# Patient Record
Sex: Male | Born: 1940 | Race: White | Hispanic: No | Marital: Married | Smoking: Never smoker
Health system: Southern US, Community
[De-identification: ages and names within clinical notes are randomized; demographics above are authoritative.]

## PROBLEM LIST (undated history)

## (undated) DIAGNOSIS — R519 Headache, unspecified: Secondary | ICD-10-CM

## (undated) DIAGNOSIS — E785 Hyperlipidemia, unspecified: Secondary | ICD-10-CM

## (undated) DIAGNOSIS — R6883 Chills (without fever): Secondary | ICD-10-CM

## (undated) DIAGNOSIS — R252 Cramp and spasm: Secondary | ICD-10-CM

## (undated) DIAGNOSIS — I5032 Chronic diastolic (congestive) heart failure: Secondary | ICD-10-CM

## (undated) DIAGNOSIS — M545 Low back pain, unspecified: Secondary | ICD-10-CM

## (undated) DIAGNOSIS — L03119 Cellulitis of unspecified part of limb: Secondary | ICD-10-CM

## (undated) DIAGNOSIS — Z9981 Dependence on supplemental oxygen: Secondary | ICD-10-CM

## (undated) DIAGNOSIS — Z952 Presence of prosthetic heart valve: Secondary | ICD-10-CM

## (undated) DIAGNOSIS — I2510 Atherosclerotic heart disease of native coronary artery without angina pectoris: Secondary | ICD-10-CM

## (undated) DIAGNOSIS — M1990 Unspecified osteoarthritis, unspecified site: Secondary | ICD-10-CM

## (undated) DIAGNOSIS — K449 Diaphragmatic hernia without obstruction or gangrene: Secondary | ICD-10-CM

## (undated) DIAGNOSIS — I350 Nonrheumatic aortic (valve) stenosis: Secondary | ICD-10-CM

## (undated) DIAGNOSIS — R51 Headache: Secondary | ICD-10-CM

## (undated) DIAGNOSIS — I739 Peripheral vascular disease, unspecified: Secondary | ICD-10-CM

## (undated) DIAGNOSIS — L02419 Cutaneous abscess of limb, unspecified: Secondary | ICD-10-CM

## (undated) DIAGNOSIS — I779 Disorder of arteries and arterioles, unspecified: Secondary | ICD-10-CM

## (undated) DIAGNOSIS — F419 Anxiety disorder, unspecified: Secondary | ICD-10-CM

## (undated) DIAGNOSIS — R059 Cough, unspecified: Secondary | ICD-10-CM

## (undated) DIAGNOSIS — K219 Gastro-esophageal reflux disease without esophagitis: Secondary | ICD-10-CM

## (undated) DIAGNOSIS — G4730 Sleep apnea, unspecified: Secondary | ICD-10-CM

## (undated) DIAGNOSIS — R600 Localized edema: Secondary | ICD-10-CM

## (undated) DIAGNOSIS — R062 Wheezing: Secondary | ICD-10-CM

## (undated) DIAGNOSIS — I272 Pulmonary hypertension, unspecified: Secondary | ICD-10-CM

## (undated) DIAGNOSIS — S42201A Unspecified fracture of upper end of right humerus, initial encounter for closed fracture: Secondary | ICD-10-CM

## (undated) DIAGNOSIS — R0602 Shortness of breath: Secondary | ICD-10-CM

## (undated) DIAGNOSIS — I10 Essential (primary) hypertension: Secondary | ICD-10-CM

## (undated) DIAGNOSIS — I050 Rheumatic mitral stenosis: Secondary | ICD-10-CM

## (undated) DIAGNOSIS — S8391XA Sprain of unspecified site of right knee, initial encounter: Secondary | ICD-10-CM

## (undated) DIAGNOSIS — R05 Cough: Secondary | ICD-10-CM

## (undated) DIAGNOSIS — I872 Venous insufficiency (chronic) (peripheral): Secondary | ICD-10-CM

## (undated) DIAGNOSIS — R0981 Nasal congestion: Secondary | ICD-10-CM

## (undated) HISTORY — DX: Cutaneous abscess of limb, unspecified: L02.419

## (undated) HISTORY — DX: Essential (primary) hypertension: I10

## (undated) HISTORY — DX: Cough: R05

## (undated) HISTORY — DX: Nonrheumatic aortic (valve) stenosis: I35.0

## (undated) HISTORY — DX: Cramp and spasm: R25.2

## (undated) HISTORY — DX: Nasal congestion: R09.81

## (undated) HISTORY — DX: Cellulitis of unspecified part of limb: L03.119

## (undated) HISTORY — DX: Sleep apnea, unspecified: G47.30

## (undated) HISTORY — DX: Venous insufficiency (chronic) (peripheral): I87.2

## (undated) HISTORY — DX: Hyperlipidemia, unspecified: E78.5

## (undated) HISTORY — DX: Localized edema: R60.0

## (undated) HISTORY — DX: Chills (without fever): R68.83

## (undated) HISTORY — DX: Wheezing: R06.2

## (undated) HISTORY — DX: Headache, unspecified: R51.9

## (undated) HISTORY — DX: Cough, unspecified: R05.9

## (undated) HISTORY — DX: Unspecified fracture of upper end of right humerus, initial encounter for closed fracture: S42.201A

## (undated) HISTORY — DX: Chronic diastolic (congestive) heart failure: I50.32

## (undated) HISTORY — PX: OTHER SURGICAL HISTORY: SHX169

## (undated) HISTORY — PX: CORONARY ARTERY BYPASS GRAFT: SHX141

## (undated) HISTORY — PX: CARDIAC CATHETERIZATION: SHX172

## (undated) HISTORY — DX: Gastro-esophageal reflux disease without esophagitis: K21.9

## (undated) HISTORY — DX: Diaphragmatic hernia without obstruction or gangrene: K44.9

## (undated) HISTORY — DX: Sprain of unspecified site of right knee, initial encounter: S83.91XA

## (undated) HISTORY — DX: Peripheral vascular disease, unspecified: I73.9

## (undated) HISTORY — DX: Pulmonary hypertension, unspecified: I27.20

## (undated) HISTORY — DX: Disorder of arteries and arterioles, unspecified: I77.9

## (undated) HISTORY — DX: Low back pain, unspecified: M54.50

## (undated) HISTORY — DX: Rheumatic mitral stenosis: I05.0

## (undated) HISTORY — DX: Low back pain: M54.5

## (undated) HISTORY — DX: Headache: R51

---

## 1996-11-05 DIAGNOSIS — Z951 Presence of aortocoronary bypass graft: Secondary | ICD-10-CM | POA: Insufficient documentation

## 1996-11-05 DIAGNOSIS — I2510 Atherosclerotic heart disease of native coronary artery without angina pectoris: Secondary | ICD-10-CM

## 1996-11-05 HISTORY — DX: Atherosclerotic heart disease of native coronary artery without angina pectoris: I25.10

## 2001-01-07 ENCOUNTER — Encounter: Payer: Self-pay | Admitting: *Deleted

## 2001-01-07 ENCOUNTER — Ambulatory Visit (HOSPITAL_COMMUNITY): Admission: RE | Admit: 2001-01-07 | Discharge: 2001-01-08 | Payer: Self-pay | Admitting: *Deleted

## 2001-07-02 ENCOUNTER — Encounter: Payer: Self-pay | Admitting: *Deleted

## 2001-07-02 ENCOUNTER — Ambulatory Visit (HOSPITAL_COMMUNITY): Admission: RE | Admit: 2001-07-02 | Discharge: 2001-07-03 | Payer: Self-pay | Admitting: *Deleted

## 2001-10-07 ENCOUNTER — Ambulatory Visit (HOSPITAL_COMMUNITY): Admission: RE | Admit: 2001-10-07 | Discharge: 2001-10-08 | Payer: Self-pay | Admitting: Ophthalmology

## 2002-09-22 ENCOUNTER — Ambulatory Visit (HOSPITAL_COMMUNITY): Admission: RE | Admit: 2002-09-22 | Discharge: 2002-09-23 | Payer: Self-pay | Admitting: Cardiology

## 2002-09-22 ENCOUNTER — Encounter: Payer: Self-pay | Admitting: Cardiology

## 2002-10-05 ENCOUNTER — Encounter (HOSPITAL_COMMUNITY): Admission: RE | Admit: 2002-10-05 | Discharge: 2003-01-03 | Payer: Self-pay | Admitting: Cardiology

## 2002-10-10 ENCOUNTER — Ambulatory Visit (HOSPITAL_BASED_OUTPATIENT_CLINIC_OR_DEPARTMENT_OTHER): Admission: RE | Admit: 2002-10-10 | Discharge: 2002-10-10 | Payer: Self-pay | Admitting: Cardiology

## 2003-01-04 ENCOUNTER — Encounter (HOSPITAL_COMMUNITY): Admission: RE | Admit: 2003-01-04 | Discharge: 2003-04-04 | Payer: Self-pay | Admitting: Cardiology

## 2004-03-28 ENCOUNTER — Ambulatory Visit: Payer: Self-pay | Admitting: Cardiology

## 2004-07-24 ENCOUNTER — Ambulatory Visit: Payer: Self-pay | Admitting: Family Medicine

## 2004-09-27 ENCOUNTER — Ambulatory Visit: Payer: Self-pay

## 2004-10-04 ENCOUNTER — Ambulatory Visit: Payer: Self-pay | Admitting: Cardiology

## 2004-10-09 ENCOUNTER — Ambulatory Visit: Payer: Self-pay | Admitting: Family Medicine

## 2004-10-10 ENCOUNTER — Ambulatory Visit: Payer: Self-pay | Admitting: Cardiology

## 2004-10-10 ENCOUNTER — Ambulatory Visit (HOSPITAL_COMMUNITY): Admission: RE | Admit: 2004-10-10 | Discharge: 2004-10-10 | Payer: Self-pay | Admitting: Cardiology

## 2004-11-22 ENCOUNTER — Ambulatory Visit: Payer: Self-pay | Admitting: Family Medicine

## 2005-02-21 ENCOUNTER — Ambulatory Visit: Payer: Self-pay | Admitting: Cardiology

## 2005-06-06 ENCOUNTER — Ambulatory Visit: Payer: Self-pay | Admitting: Family Medicine

## 2005-08-03 ENCOUNTER — Ambulatory Visit: Payer: Self-pay | Admitting: Cardiology

## 2005-09-19 ENCOUNTER — Ambulatory Visit: Payer: Self-pay

## 2005-12-10 ENCOUNTER — Ambulatory Visit: Payer: Self-pay | Admitting: Internal Medicine

## 2006-02-06 ENCOUNTER — Ambulatory Visit: Payer: Self-pay | Admitting: Family Medicine

## 2006-02-12 ENCOUNTER — Ambulatory Visit: Payer: Self-pay | Admitting: Cardiology

## 2006-02-12 ENCOUNTER — Ambulatory Visit: Payer: Self-pay | Admitting: Internal Medicine

## 2006-03-19 ENCOUNTER — Ambulatory Visit: Payer: Self-pay | Admitting: Internal Medicine

## 2006-04-09 ENCOUNTER — Ambulatory Visit: Payer: Self-pay | Admitting: Family Medicine

## 2006-08-20 ENCOUNTER — Ambulatory Visit: Payer: Self-pay | Admitting: Family Medicine

## 2006-08-20 DIAGNOSIS — S60459A Superficial foreign body of unspecified finger, initial encounter: Secondary | ICD-10-CM | POA: Insufficient documentation

## 2006-11-18 ENCOUNTER — Ambulatory Visit: Payer: Self-pay | Admitting: Family Medicine

## 2006-12-26 ENCOUNTER — Ambulatory Visit: Payer: Self-pay | Admitting: Cardiology

## 2006-12-31 ENCOUNTER — Ambulatory Visit: Payer: Self-pay

## 2006-12-31 ENCOUNTER — Encounter: Payer: Self-pay | Admitting: Family Medicine

## 2006-12-31 HISTORY — PX: OTHER SURGICAL HISTORY: SHX169

## 2007-01-06 ENCOUNTER — Ambulatory Visit: Payer: Self-pay | Admitting: Pulmonary Disease

## 2007-01-23 ENCOUNTER — Encounter: Payer: Self-pay | Admitting: Critical Care Medicine

## 2007-01-23 DIAGNOSIS — E785 Hyperlipidemia, unspecified: Secondary | ICD-10-CM | POA: Insufficient documentation

## 2007-01-23 DIAGNOSIS — E1149 Type 2 diabetes mellitus with other diabetic neurological complication: Secondary | ICD-10-CM

## 2007-01-23 DIAGNOSIS — M545 Low back pain: Secondary | ICD-10-CM | POA: Insufficient documentation

## 2007-01-23 DIAGNOSIS — K449 Diaphragmatic hernia without obstruction or gangrene: Secondary | ICD-10-CM | POA: Insufficient documentation

## 2007-01-23 DIAGNOSIS — I10 Essential (primary) hypertension: Secondary | ICD-10-CM | POA: Insufficient documentation

## 2007-01-23 DIAGNOSIS — K219 Gastro-esophageal reflux disease without esophagitis: Secondary | ICD-10-CM | POA: Insufficient documentation

## 2007-03-26 ENCOUNTER — Encounter: Payer: Self-pay | Admitting: Family Medicine

## 2007-03-27 ENCOUNTER — Encounter: Payer: Self-pay | Admitting: Family Medicine

## 2007-06-06 ENCOUNTER — Ambulatory Visit: Payer: Self-pay | Admitting: Family Medicine

## 2007-06-06 DIAGNOSIS — R252 Cramp and spasm: Secondary | ICD-10-CM | POA: Insufficient documentation

## 2007-06-09 LAB — CONVERTED CEMR LAB
Basophils Absolute: 0 10*3/uL (ref 0.0–0.1)
Basophils Relative: 1 % (ref 0–1)
CO2: 23 meq/L (ref 19–32)
Chloride: 103 meq/L (ref 96–112)
Eosinophils Absolute: 0.2 10*3/uL (ref 0.0–0.7)
Eosinophils Relative: 4 % (ref 0–5)
HCT: 39.1 % (ref 39.0–52.0)
Hemoglobin: 13.3 g/dL (ref 13.0–17.0)
Lymphocytes Relative: 19 % (ref 12–46)
Lymphs Abs: 1.2 10*3/uL (ref 0.7–4.0)
MCHC: 34 g/dL (ref 30.0–36.0)
MCV: 91.1 fL (ref 78.0–100.0)
Monocytes Absolute: 0.5 10*3/uL (ref 0.1–1.0)
Monocytes Relative: 9 % (ref 3–12)
Neutro Abs: 4.2 10*3/uL (ref 1.7–7.7)
Neutrophils Relative %: 68 % (ref 43–77)
Platelets: 265 10*3/uL (ref 150–400)
Potassium: 4.1 meq/L (ref 3.5–5.3)
RBC: 4.29 M/uL (ref 4.22–5.81)
RDW: 15 % (ref 11.5–15.5)
Sodium: 141 meq/L (ref 135–145)
TSH: 1.288 microintl units/mL (ref 0.350–5.50)
WBC: 6.2 10*3/uL (ref 4.0–10.5)

## 2007-06-17 ENCOUNTER — Telehealth: Payer: Self-pay | Admitting: Family Medicine

## 2007-06-25 ENCOUNTER — Encounter: Payer: Self-pay | Admitting: Family Medicine

## 2007-07-21 ENCOUNTER — Ambulatory Visit: Payer: Self-pay | Admitting: Family Medicine

## 2007-07-21 DIAGNOSIS — IMO0002 Reserved for concepts with insufficient information to code with codable children: Secondary | ICD-10-CM | POA: Insufficient documentation

## 2007-07-21 LAB — HM DIABETES EYE EXAM

## 2007-08-06 ENCOUNTER — Telehealth: Payer: Self-pay | Admitting: Family Medicine

## 2007-08-07 ENCOUNTER — Encounter: Payer: Self-pay | Admitting: Family Medicine

## 2007-09-24 ENCOUNTER — Ambulatory Visit: Payer: Self-pay

## 2007-09-26 ENCOUNTER — Telehealth: Payer: Self-pay | Admitting: Family Medicine

## 2007-10-01 ENCOUNTER — Ambulatory Visit: Payer: Self-pay | Admitting: Cardiology

## 2007-10-16 ENCOUNTER — Encounter: Payer: Self-pay | Admitting: Family Medicine

## 2007-11-25 ENCOUNTER — Telehealth: Payer: Self-pay | Admitting: Family Medicine

## 2008-01-16 ENCOUNTER — Ambulatory Visit: Payer: Self-pay

## 2008-01-16 ENCOUNTER — Ambulatory Visit: Payer: Self-pay | Admitting: Family Medicine

## 2008-01-16 DIAGNOSIS — R609 Edema, unspecified: Secondary | ICD-10-CM | POA: Insufficient documentation

## 2008-01-16 DIAGNOSIS — L02419 Cutaneous abscess of limb, unspecified: Secondary | ICD-10-CM | POA: Insufficient documentation

## 2008-01-16 DIAGNOSIS — L03119 Cellulitis of unspecified part of limb: Secondary | ICD-10-CM

## 2008-03-03 ENCOUNTER — Telehealth: Payer: Self-pay | Admitting: Family Medicine

## 2008-03-08 ENCOUNTER — Ambulatory Visit: Payer: Self-pay | Admitting: Family Medicine

## 2008-03-15 ENCOUNTER — Ambulatory Visit: Payer: Self-pay | Admitting: Family Medicine

## 2008-03-22 ENCOUNTER — Ambulatory Visit: Payer: Self-pay | Admitting: Family Medicine

## 2008-04-19 ENCOUNTER — Encounter: Payer: Self-pay | Admitting: Family Medicine

## 2008-05-10 ENCOUNTER — Encounter: Payer: Self-pay | Admitting: Family Medicine

## 2008-06-15 ENCOUNTER — Telehealth (INDEPENDENT_AMBULATORY_CARE_PROVIDER_SITE_OTHER): Payer: Self-pay | Admitting: *Deleted

## 2008-06-23 ENCOUNTER — Telehealth: Payer: Self-pay | Admitting: Family Medicine

## 2008-06-24 DIAGNOSIS — I25810 Atherosclerosis of coronary artery bypass graft(s) without angina pectoris: Secondary | ICD-10-CM

## 2008-06-24 DIAGNOSIS — I6529 Occlusion and stenosis of unspecified carotid artery: Secondary | ICD-10-CM | POA: Insufficient documentation

## 2008-06-24 DIAGNOSIS — G4730 Sleep apnea, unspecified: Secondary | ICD-10-CM | POA: Insufficient documentation

## 2008-07-19 ENCOUNTER — Encounter: Payer: Self-pay | Admitting: Cardiology

## 2008-07-28 ENCOUNTER — Telehealth: Payer: Self-pay | Admitting: Cardiology

## 2008-07-29 ENCOUNTER — Telehealth: Payer: Self-pay | Admitting: Family Medicine

## 2008-08-03 ENCOUNTER — Encounter: Payer: Self-pay | Admitting: Family Medicine

## 2008-09-21 ENCOUNTER — Ambulatory Visit: Payer: Self-pay | Admitting: Cardiology

## 2008-09-22 ENCOUNTER — Telehealth: Payer: Self-pay | Admitting: Cardiology

## 2008-10-08 ENCOUNTER — Inpatient Hospital Stay (HOSPITAL_COMMUNITY): Admission: EM | Admit: 2008-10-08 | Discharge: 2008-10-11 | Payer: Self-pay | Admitting: Cardiology

## 2008-10-08 ENCOUNTER — Ambulatory Visit: Payer: Self-pay | Admitting: Cardiology

## 2008-10-08 ENCOUNTER — Ambulatory Visit: Payer: Self-pay | Admitting: Internal Medicine

## 2008-10-11 ENCOUNTER — Encounter: Payer: Self-pay | Admitting: Cardiology

## 2008-10-11 ENCOUNTER — Ambulatory Visit: Payer: Self-pay | Admitting: *Deleted

## 2008-10-11 HISTORY — PX: OTHER SURGICAL HISTORY: SHX169

## 2008-10-11 HISTORY — PX: DOPPLER ECHOCARDIOGRAPHY: SHX263

## 2008-10-12 ENCOUNTER — Ambulatory Visit: Payer: Self-pay | Admitting: Cardiology

## 2008-10-13 LAB — CONVERTED CEMR LAB
BUN: 47 mg/dL — ABNORMAL HIGH (ref 6–23)
CO2: 31 meq/L (ref 19–32)
Calcium: 9.6 mg/dL (ref 8.4–10.5)
Chloride: 94 meq/L — ABNORMAL LOW (ref 96–112)
Creatinine, Ser: 1.9 mg/dL — ABNORMAL HIGH (ref 0.4–1.5)
GFR calc non Af Amer: 37.87 mL/min (ref >60)
Glucose, Bld: 382 mg/dL — ABNORMAL HIGH (ref 70–99)
Potassium: 5 meq/L (ref 3.5–5.1)
Sodium: 133 meq/L — ABNORMAL LOW (ref 135–145)

## 2008-10-15 ENCOUNTER — Ambulatory Visit: Payer: Self-pay | Admitting: Cardiology

## 2008-10-15 ENCOUNTER — Encounter (HOSPITAL_BASED_OUTPATIENT_CLINIC_OR_DEPARTMENT_OTHER): Admission: RE | Admit: 2008-10-15 | Discharge: 2009-01-13 | Payer: Self-pay | Admitting: Internal Medicine

## 2008-10-15 DIAGNOSIS — L98499 Non-pressure chronic ulcer of skin of other sites with unspecified severity: Secondary | ICD-10-CM

## 2008-10-15 DIAGNOSIS — I080 Rheumatic disorders of both mitral and aortic valves: Secondary | ICD-10-CM

## 2008-10-15 DIAGNOSIS — I70209 Unspecified atherosclerosis of native arteries of extremities, unspecified extremity: Secondary | ICD-10-CM

## 2008-10-15 DIAGNOSIS — I739 Peripheral vascular disease, unspecified: Secondary | ICD-10-CM

## 2008-10-15 DIAGNOSIS — I7025 Atherosclerosis of native arteries of other extremities with ulceration: Secondary | ICD-10-CM | POA: Insufficient documentation

## 2008-10-18 ENCOUNTER — Ambulatory Visit: Payer: Self-pay | Admitting: Cardiology

## 2008-10-18 DIAGNOSIS — E875 Hyperkalemia: Secondary | ICD-10-CM

## 2008-10-18 LAB — CONVERTED CEMR LAB
BUN: 33 mg/dL — ABNORMAL HIGH (ref 6–23)
CO2: 28 meq/L (ref 19–32)
Calcium: 10 mg/dL (ref 8.4–10.5)
Chloride: 98 meq/L (ref 96–112)
Creatinine, Ser: 1.32 mg/dL (ref 0.40–1.50)
Glucose, Bld: 278 mg/dL — ABNORMAL HIGH (ref 70–99)
Potassium: 5.6 meq/L — ABNORMAL HIGH (ref 3.5–5.3)
Sodium: 133 meq/L — ABNORMAL LOW (ref 135–145)

## 2008-10-19 LAB — CONVERTED CEMR LAB
BUN: 39 mg/dL — ABNORMAL HIGH (ref 6–23)
CO2: 29 meq/L (ref 19–32)
Calcium: 9.2 mg/dL (ref 8.4–10.5)
Chloride: 105 meq/L (ref 96–112)
Creatinine, Ser: 1.6 mg/dL — ABNORMAL HIGH (ref 0.4–1.5)
GFR calc non Af Amer: 46.18 mL/min (ref >60)
Glucose, Bld: 298 mg/dL — ABNORMAL HIGH (ref 70–99)
Potassium: 5.3 meq/L — ABNORMAL HIGH (ref 3.5–5.1)
Sodium: 139 meq/L (ref 135–145)

## 2008-10-20 ENCOUNTER — Encounter: Payer: Self-pay | Admitting: Cardiology

## 2008-10-20 ENCOUNTER — Encounter: Payer: Self-pay | Admitting: Family Medicine

## 2008-10-28 ENCOUNTER — Encounter (INDEPENDENT_AMBULATORY_CARE_PROVIDER_SITE_OTHER): Payer: Self-pay | Admitting: *Deleted

## 2008-10-28 ENCOUNTER — Ambulatory Visit: Payer: Self-pay | Admitting: Cardiology

## 2008-10-28 LAB — CONVERTED CEMR LAB
BUN: 33 mg/dL
Chloride: 102 meq/L
Potassium: 4.3 meq/L
Sodium: 139 meq/L

## 2008-10-29 LAB — CONVERTED CEMR LAB
BUN: 33 mg/dL — ABNORMAL HIGH (ref 6–23)
CO2: 25 meq/L (ref 19–32)
Calcium: 10.1 mg/dL (ref 8.4–10.5)
Chloride: 102 meq/L (ref 96–112)
Creatinine, Ser: 1.27 mg/dL (ref 0.40–1.50)
Glucose, Bld: 246 mg/dL — ABNORMAL HIGH (ref 70–99)
Potassium: 4.3 meq/L (ref 3.5–5.3)
Sodium: 139 meq/L (ref 135–145)

## 2008-11-05 ENCOUNTER — Ambulatory Visit: Payer: Self-pay | Admitting: Cardiovascular Disease

## 2008-11-10 ENCOUNTER — Encounter: Payer: Self-pay | Admitting: Cardiology

## 2008-11-16 ENCOUNTER — Encounter: Payer: Self-pay | Admitting: Cardiology

## 2008-11-16 ENCOUNTER — Encounter (INDEPENDENT_AMBULATORY_CARE_PROVIDER_SITE_OTHER): Payer: Self-pay | Admitting: *Deleted

## 2008-11-16 LAB — CONVERTED CEMR LAB
Testosterone, total: 300 ng/mL
Vit D, 25-Hydroxy: 39.1 ng/mL

## 2008-11-25 ENCOUNTER — Telehealth: Payer: Self-pay | Admitting: Family Medicine

## 2008-12-16 ENCOUNTER — Encounter: Payer: Self-pay | Admitting: Family Medicine

## 2008-12-22 ENCOUNTER — Ambulatory Visit: Payer: Self-pay | Admitting: Family Medicine

## 2009-01-25 ENCOUNTER — Ambulatory Visit: Payer: Self-pay | Admitting: Family Medicine

## 2009-01-25 DIAGNOSIS — K5289 Other specified noninfective gastroenteritis and colitis: Secondary | ICD-10-CM

## 2009-01-28 ENCOUNTER — Ambulatory Visit: Payer: Self-pay | Admitting: Cardiology

## 2009-02-09 ENCOUNTER — Ambulatory Visit: Payer: Self-pay | Admitting: Family Medicine

## 2009-02-09 DIAGNOSIS — R197 Diarrhea, unspecified: Secondary | ICD-10-CM

## 2009-02-14 ENCOUNTER — Ambulatory Visit: Payer: Self-pay | Admitting: Family Medicine

## 2009-02-14 ENCOUNTER — Encounter: Payer: Self-pay | Admitting: Family Medicine

## 2009-04-01 ENCOUNTER — Encounter: Payer: Self-pay | Admitting: Family Medicine

## 2009-04-15 ENCOUNTER — Ambulatory Visit: Payer: Self-pay | Admitting: Cardiology

## 2009-04-15 DIAGNOSIS — E669 Obesity, unspecified: Secondary | ICD-10-CM

## 2009-06-29 ENCOUNTER — Encounter: Payer: Self-pay | Admitting: Family Medicine

## 2009-07-15 ENCOUNTER — Ambulatory Visit: Payer: Self-pay | Admitting: Cardiology

## 2009-07-15 DIAGNOSIS — R0989 Other specified symptoms and signs involving the circulatory and respiratory systems: Secondary | ICD-10-CM

## 2009-07-26 ENCOUNTER — Ambulatory Visit (HOSPITAL_COMMUNITY)
Admission: RE | Admit: 2009-07-26 | Discharge: 2009-07-26 | Payer: Self-pay | Source: Home / Self Care | Admitting: Cardiology

## 2009-07-27 ENCOUNTER — Encounter (INDEPENDENT_AMBULATORY_CARE_PROVIDER_SITE_OTHER): Payer: Self-pay | Admitting: *Deleted

## 2009-07-27 ENCOUNTER — Encounter: Payer: Self-pay | Admitting: Cardiology

## 2009-10-10 ENCOUNTER — Encounter (INDEPENDENT_AMBULATORY_CARE_PROVIDER_SITE_OTHER): Payer: Self-pay | Admitting: *Deleted

## 2009-10-11 ENCOUNTER — Encounter (INDEPENDENT_AMBULATORY_CARE_PROVIDER_SITE_OTHER): Payer: Self-pay | Admitting: *Deleted

## 2009-10-27 ENCOUNTER — Telehealth (INDEPENDENT_AMBULATORY_CARE_PROVIDER_SITE_OTHER): Payer: Self-pay | Admitting: *Deleted

## 2009-11-28 ENCOUNTER — Encounter: Payer: Self-pay | Admitting: Cardiology

## 2009-11-28 ENCOUNTER — Encounter: Payer: Self-pay | Admitting: Family Medicine

## 2010-02-20 ENCOUNTER — Telehealth: Payer: Self-pay | Admitting: Family Medicine

## 2010-03-07 ENCOUNTER — Encounter: Payer: Self-pay | Admitting: Family Medicine

## 2010-03-07 ENCOUNTER — Encounter: Payer: Self-pay | Admitting: Cardiology

## 2010-03-15 ENCOUNTER — Encounter (INDEPENDENT_AMBULATORY_CARE_PROVIDER_SITE_OTHER): Payer: Self-pay | Admitting: *Deleted

## 2010-03-15 ENCOUNTER — Ambulatory Visit: Admit: 2010-03-15 | Payer: Self-pay | Admitting: Cardiology

## 2010-04-04 ENCOUNTER — Ambulatory Visit
Admission: RE | Admit: 2010-04-04 | Discharge: 2010-04-04 | Payer: Self-pay | Source: Home / Self Care | Attending: Cardiology | Admitting: Cardiology

## 2010-04-04 ENCOUNTER — Encounter: Payer: Self-pay | Admitting: Cardiology

## 2010-04-06 NOTE — Progress Notes (Signed)
Summary: Rx Zoloft  Phone Note Refill Request Call back at [phone] Message from:  [facility] on February 20, 2010 8:47 AM  Refills Requested: Medication #1:  ZOLOFT 50 MG  TABS 1 daily by mouth   Supply Requested: 3 months   Last Refilled: 11/24/2009 Received faxed refill request please advise.   Method Requested: Telephone to Pharmacy Initial call taken by: Linde Gillis CMA Duncan Dull),  February 20, 2010 8:48 AM  Follow-up for Phone Call        Rx called to pharmacy Follow-up by: Linde Gillis CMA Duncan Dull),  February 20, 2010 3:53 PM    Prescriptions: ZOLOFT 50 MG  TABS (SERTRALINE HCL) 1 daily by mouth  #90 x 3   Entered and Authorized by:   Shaune Leeks MD   Signed by:   Shaune Leeks MD on 02/20/2010   Method used:   Electronically to        [facility]* (retail)       [address]       [place], [place]  [address]       Ph: [phone]       Fax: [phone]   RxID:   [id number]

## 2010-04-06 NOTE — Letter (Signed)
Summary: [place] [facility] at [facility]  [address] [place] [address]   Phone: [phone]  Fax: [phone]      Jul 27, 2009 MRN: [id number]   [facility] [address] [place], [place]  [address]   Dear Mr. Droessler,  Your test ordered by Selena Batten has been reviewed by your physician (or physician assistant) and was found to be normal or stable. Your physician (or physician assistant) felt no changes were needed at this time.  ____ Echocardiogram  ____ Cardiac Stress Test  ____ Lab Work  __X__ Peripheral vascular study of arms, legs or neck  ____ CT scan or X-ray  ____ Lung or Breathing test  ____ Other: Please continue on current medical treatment.  Thank you.   Valera Castle, MD, F.A.C.C

## 2010-04-06 NOTE — Letter (Signed)
Summary: [facility]  [facility]   Imported By: Marylou Mccoy 03/28/2010 15:48:14  _____________________________________________________________________  External Attachment:    Type:   Image     Comment:   External Document

## 2010-04-06 NOTE — Assessment & Plan Note (Signed)
Summary: ***F3M   Visit Type:  3 month follow up Primary Provider:  Shaune Leeks MD  CC:  no c/o.  History of Present Illness: Mr [name] [name] comes in today for followup and management of his extensive cardiac and vascular problems.  He feels the best he felt in quite some time. He has lost about 35 pounds over the last several months. Some of this was fluid with subsequent significant improvement in his leg edema and resolution of his saline this and deeper wound infection and ulceration.  He is now sleeping lying down. He has sleep apnea but cannot wear the device.  He's having no angina or ischemic symptoms. His functional level has improved.  Current Medications (verified): 1)  Prandin 2 Mg  Tabs (Repaglinide) .Marland Kitchen.. 1 Three Times  A Day By Mouth 2)  Lipitor 40 Mg Tabs (Atorvastatin Calcium) .... Take One Tablet By Mouth Daily. 3)  Prilosec 20 Mg  Cpdr (Omeprazole) .Marland Kitchen.. 1 Daily By Mouth 4)  Antara 130 Mg  Caps (Fenofibrate Micronized) .Marland Kitchen.. 1 Tablet Daily By Mouth 5)  Metformin Hcl 1000 Mg Tabs (Metformin Hcl) .... Take 1 Tablet By Mouth Two Times A Day 6)  Plavix 75 Mg  Tabs (Clopidogrel Bisulfate) .Marland Kitchen.. 1 Daily By Mouth 7)  Metoprolol Tartrate 50 Mg Tabs (Metoprolol Tartrate) .... Take 1 Tablet in Morning and 1 Tablet At Bedtime 8)  Altace 10 Mg  Caps (Ramipril) .Marland Kitchen.. 1 Daily By Mouth 9)  Zetia 10 Mg  Tabs (Ezetimibe) .Marland Kitchen.. 1 Daily By Mouth 10)  Lantus 100 Unit/ml  Soln (Insulin Glargine) .... 90 Units Three Times A Day 11)  Zoloft 50 Mg  Tabs (Sertraline Hcl) .Marland Kitchen.. 1 Daily By Mouth 12)  Nitroglycerin 0.4 Mg Subl (Nitroglycerin) .... One Tablet Under Tongue Every 5 Minutes As Needed For Chest Pain---May Repeat Times Three 13)  Fish Oil 1200 Mg Caps (Omega-3 Fatty Acids) .... 2 Caps Two Times A Day 14)  Furosemide 80 Mg Tabs (Furosemide) .... Take 1 Tablet Once Daily 15)  Potassium Chloride Crys Cr 20 Meq Cr-Tabs (Potassium Chloride Crys Cr) .... Take One Tablet By Mouth Daily 16)   Vitamin D 50,000 Iu .... Once A Week 17)  Coenzyme Q10 10 Mg Caps (Coenzyme Q10) .... One Daily 18)  Ranitidine Hcl 75 Mg Tabs (Ranitidine Hcl) .... One Tablet Twice A Day 19)  Co Q-10 30 Mg  Caps (Coenzyme Q10) .... 2 Once Daily 20)  Promethazine Hcl 25 Mg  Tabs (Promethazine Hcl) .... 1/2 Tablet To 1 Tablet Up To Every 6 Hours As Needed For Nausea 21)  Aspirin 81 Mg Tbec (Aspirin) .... Take One Tablet By Mouth Daily  Allergies (verified): 1)  ! Celebrex  Past History:  Past Medical History: Last updated: 06/24/2008 SLEEP APNEA (ICD-780.57) CAROTID ARTERY DISEASE (ICD-433.10) CAD, ARTERY BYPASS GRAFT (ICD-414.04) CELLULITIS AND ABSCESS OF LEG EXCEPT FOOT (ICD-682.6) EDEMA LEG (ICD-782.3) KNEE SPRAIN, RIGHT (ICD-844.9) MUSCLE CRAMPS (ICD-729.82) LOW BACK PAIN, CHRONIC (ICD-724.2) GERD (ICD-530.81) HYPERLIPIDEMIA (ICD-272.4) HYPERTENSION (ICD-401.9) HIATAL HERNIA (ICD-553.3) DM (ICD-250.00) FOREIGN BODY, FINGER W/O INFECTION (ICD-915.6)  Past Surgical History: Last updated: 10/18/2008 ETT CARDIOLITE MILD-MOD DISTAL INF/APICAL  ISCHEMIA 12/31/2006 Open Heart Surgery HOSP Acute on Chronic Diastolic HF   RLE Cellulitis  1/3-0/10/6576 ECHO Mild AS Mild-Mod MS Severe LVH  10/11/08 LE Art and Venous U/S  Art clear  Venous Neg DVT  10/11/08  Family History: Last updated: 06/24/2008 Family History of Diabetes:   Social History: Last updated: 06/24/2008 Married  Tobacco Use -  No.  Alcohol Use - no Drug Use - no  Risk Factors: Smoking Status: never (06/24/2008)  Review of Systems       negative other than history of present illness  Vital Signs:  Patient profile:   70 year old male Height:      71 inches Weight:      258 pounds O2 Sat:      94 % on Room air Pulse rate:   50 / minute BP sitting:   129 / 57  (left arm)  Vitals Entered By: Teressa Lower RN (April 15, 2009 1:09 PM)  O2 Flow:  Room air  Physical Exam  General:  marked improvement with his weight  loss. Skin color is much better and he is not short of breath and in no acute distress. Head:  normocephalic and atraumatic Eyes:  PERRLA/EOM intact; conjunctiva and lids normal. Neck:  Neck supple, no JVD. No masses, thyromegaly or abnormal cervical nodes. Lungs:  Clear bilaterally to auscultation and percussion. Heart:  regular rate and rhythm, systolic murmur consistent with aortic stenosis. He has soft bilateral carotid bruits Msk:  decreased ROM.  decreased ROM.   Pulses:  absent pulses in the lower extremities which is baseline Extremities:  trace left pedal edema and trace right pedal edema.  trace left pedal edema and trace right pedal edema.   Neurologic:  Alert and oriented x 3. Skin:  Intact without lesions or rashes. Psych:  Normal affect.   Impression & Recommendations:  Problem # 1:  CAD, ARTERY BYPASS GRAFT (ICD-414.04) Assessment Unchanged He did not have aspirin on his list. He is taking 81 mg a day. No changes made. See him back in 3 month period His updated medication list for this problem includes:    Plavix 75 Mg Tabs (Clopidogrel bisulfate) .Marland Kitchen... 1 daily by mouth    Metoprolol Tartrate 50 Mg Tabs (Metoprolol tartrate) .Marland Kitchen... Take 1 tablet in morning and 1 tablet at bedtime    Altace 10 Mg Caps (Ramipril) .Marland Kitchen... 1 daily by mouth    Nitroglycerin 0.4 Mg Subl (Nitroglycerin) ..... One tablet under tongue every 5 minutes as needed for chest pain---may repeat times three    Aspirin 81 Mg Tbec (Aspirin) .Marland Kitchen... Take one tablet by mouth daily  His updated medication list for this problem includes:    Plavix 75 Mg Tabs (Clopidogrel bisulfate) .Marland Kitchen... 1 daily by mouth    Metoprolol Tartrate 50 Mg Tabs (Metoprolol tartrate) .Marland Kitchen... Take 1 tablet in morning and 1 tablet at bedtime    Altace 10 Mg Caps (Ramipril) .Marland Kitchen... 1 daily by mouth    Nitroglycerin 0.4 Mg Subl (Nitroglycerin) ..... 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 1 daily by mouth    Aspirin 81 Mg Tbec (Aspirin) .Marland Kitchen... Take one tablet by mouth daily  His updated medication list for this problem includes:    Plavix 75 Mg Tabs (Clopidogrel bisulfate) .Marland Kitchen... 1 daily by mouth  Problem # 6:  EDEMA LEG (ICD-782.3) Assessment: Improved  Problem # 7:  HYPERTENSION (ICD-401.9)  His updated medication list  for this problem includes:    Metoprolol Tartrate 50 Mg Tabs (Metoprolol tartrate) .Marland Kitchen... Take 1 tablet in morning and 1 tablet at bedtime    Altace 10 Mg Caps (Ramipril) .Marland Kitchen... 1 daily by mouth    Furosemide 80 Mg Tabs (Furosemide) .Marland Kitchen... Take 1 tablet once daily    Aspirin 81 Mg Tbec (Aspirin) .Marland Kitchen... Take one tablet by mouth daily  His updated medication list for this problem includes:    Metoprolol Tartrate 50 Mg Tabs (Metoprolol tartrate) .Marland Kitchen... Take 1 tablet in morning and 1 tablet at bedtime    Altace 10 Mg Caps (Ramipril) .Marland Kitchen... 1 daily by mouth    Furosemide 80 Mg Tabs (Furosemide) .Marland Kitchen... Take 1 tablet once daily  Problem # 8:  HYPERLIPIDEMIA (ICD-272.4)  His updated medication list for this problem includes:    Lipitor 40 Mg Tabs (Atorvastatin calcium) .Marland Kitchen... Take one tablet by mouth daily.    Antara 130 Mg Caps (Fenofibrate micronized) .Marland Kitchen... 1 tablet daily by mouth    Zetia 10 Mg Tabs (Ezetimibe) .Marland Kitchen... 1 daily by mouth  His updated medication list for this problem includes:    Lipitor 40 Mg Tabs (Atorvastatin calcium) .Marland Kitchen... Take one tablet by mouth daily.    Antara 130 Mg Caps (Fenofibrate micronized) .Marland Kitchen... 1 tablet daily by mouth    Zetia 10  Mg Tabs (Ezetimibe) .Marland Kitchen... 1 daily by mouth  Problem # 9:  DM (ICD-250.00) Assessment: Unchanged  His updated medication list for this problem includes:    Prandin 2 Mg Tabs (Repaglinide) .Marland Kitchen... 1 three times  a day by mouth    Metformin Hcl 1000 Mg Tabs (Metformin hcl) .Marland Kitchen... Take 1 tablet by mouth two times a day    Altace 10 Mg Caps (Ramipril) .Marland Kitchen... 1 daily by mouth    Lantus 100 Unit/ml Soln (Insulin glargine) .Marland Kitchen... 90 units three times a day    Aspirin 81 Mg Tbec (Aspirin) .Marland Kitchen... Take one tablet by mouth daily  His updated medication list for this problem includes:    Prandin 2 Mg Tabs (Repaglinide) .Marland Kitchen... 1 three times  a day by mouth    Metformin Hcl 1000 Mg Tabs (Metformin hcl) .Marland Kitchen... Take 1 tablet by mouth two times a day    Altace 10 Mg Caps (Ramipril) .Marland Kitchen... 1 daily by mouth    Lantus 100 Unit/ml Soln (Insulin glargine) .Marland Kitchen... 90 units three times a day  Problem # 10:  OBESITY (ICD-278.00) Assessment: Improved He has lost from close to 300 pounds down to 258. A fair amount of this was fluid however a fair amount of it was fat. He feels remarkably better. I've asked her to lose down about 240 pounds realistically. I lose about 12 pounds in the next 3 months. I'll see him back at that time.  Patient Instructions: 1)  Your physician recommends that you schedule a follow-up appointment in: 3 months 2)  Your physician recommends that you continue on your current medications as directed. Please refer to the Current Medication list given to you today. 3)  Your physician encouraged you to lose weight for better health. 4)  Your physician recommends a low cholesterol, low fat diet. Please see [facility] handout.

## 2010-04-06 NOTE — Letter (Signed)
Summary: Dr.Michael Altheimer,[facility],Note  Dr.Michael Altheimer,[facility],Note   Imported By: Beau Fanny 12/07/2009 10:21:04  _____________________________________________________________________  External Attachment:    Type:   Image     Comment:   External Document

## 2010-04-06 NOTE — Letter (Signed)
Summary: Appointment - Missed  [facility] [place]  [address] [place] [address]   Phone: [phone]  Fax: [phone]     March 15, 2010 MRN: [id number]   [facility] [address] [place], [place]  [address]   Dear [name],  Our records indicate you missed your appointment on          03/15/10              with Dr.       .   ROTHBART                                 It is very important that we reach you to reschedule this appointment. We look forward to participating in your health care needs. Please contact us at the number listed above at your earliest convenience to reschedule this appointment.     Sincerely,    Glass blower/designer

## 2010-04-06 NOTE — Miscellaneous (Signed)
Summary: BMP,10/28/2008  Clinical Lists Changes  Observations: Added new observation of CALCIUM: 10.1 mg/dL (16/12/9602 54:09) Added new observation of CREATININE: 1.27 mg/dL (81/19/1478 29:56) Added new observation of BUN: 33 mg/dL (21/30/8657 84:69) Added new observation of BG RANDOM: 246 mg/dL (62/95/2841 32:44) Added new observation of CO2 PLSM/SER: 25 meq/L (10/28/2008 10:54) Added new observation of CL SERUM: 102 meq/L (10/28/2008 10:54) Added new observation of K SERUM: 4.3 meq/L (10/28/2008 10:54) Added new observation of NA: 139 meq/L (10/28/2008 10:54)

## 2010-04-06 NOTE — Letter (Signed)
Summary: Nadara Eaton letter  [place] [facility]  [address] [place], [place] [address]   Phone: [phone]  Fax: [phone]       10/10/2009 MRN: [id number]  [facility] [address] [place], [place]  [address]  Dear Mr. Houdek,  [facility] [place], and [place] announce the retirement of Arta Silence, M.D., from full-time practice at the [facility] [facility] office effective September 01, 2009 and his plans of returning part-time.  It is important to Dr. Hetty Ely and to our practice that you understand that [facility] has seven physicians in our office for your health care needs.  We will continue to offer the same exceptional care that you have today.    Dr. Hetty Ely has spoken to many of you about his plans for retirement and returning part-time in the fall.   We will continue to work with you through the transition to schedule appointments for you in the office and meet the high standards that [facility] is committed to.   Again, it is with great pleasure that we share the news that Dr. Hetty Ely will return to [facility] in October of 2011 with a reduced schedule.    If you have any questions, or would like to request an appointment with one of our physicians, please call us at [phone] and press the option for Scheduling an appointment.  We take pleasure in providing you with excellent patient care and look forward to seeing you at your next office visit.  Our [facility] Physicians are:  Tillman Abide, M.D. Laurita Quint, M.D. Roxy Manns, M.D. Kerby Nora, M.D. Hannah Beat, M.D. Ruthe Mannan, M.D. We proudly welcomed Raechel Ache, M.D. and Eustaquio Boyden, M.D. to the practice in July/August 2011.  Sincerely,  [facility]

## 2010-04-06 NOTE — Letter (Signed)
Summary: [facility] Progress Note   [facility] Progress Note   Imported By: Roderic Ovens 12/13/2009 15:59:34  _____________________________________________________________________  External Attachment:    Type:   Image     Comment:   External Document

## 2010-04-06 NOTE — Assessment & Plan Note (Signed)
Summary: F3M   Visit Type:  Follow-up Primary Provider:  Shaune Leeks MD  CC:  no cardiology complaints today.  History of Present Illness: [name] [name] returns today for close followup and management of his complex cardiac and vascular history.  On last visit, he lost a substantial amount of weight. He fell and looked the best not seen him in a number of years. Please refer to that note.  He's lost additional 2 pounds. He offers no complaints of ischemia or congestive heart failure.  Recent blood work I have reviewed from Dr. Layne Benton office. Call numbers are at goal except for his low HDL and a hemoglobin A1c which was 8.2%.  He's had a few little sores on his legs but they have been uncomplicated. He's had no problems with his feet. His peripheral vascular disease has been evaluated by Dr. Excell Seltzer who has recommended medical therapy. He no longer has to attend the wound center.  Current Medications (verified): 1)  Prandin 2 Mg  Tabs (Repaglinide) .Marland Kitchen.. 1 Three Times  A Day By Mouth 2)  Lipitor 40 Mg Tabs (Atorvastatin Calcium) .... Take One Tablet By Mouth Daily. 3)  Prilosec 20 Mg  Cpdr (Omeprazole) .Marland Kitchen.. 1 Daily By Mouth 4)  Antara 130 Mg  Caps (Fenofibrate Micronized) .Marland Kitchen.. 1 Tablet Daily By Mouth 5)  Metformin Hcl 1000 Mg Tabs (Metformin Hcl) .... Take 1 Tablet By Mouth Two Times A Day 6)  Plavix 75 Mg  Tabs (Clopidogrel Bisulfate) .Marland Kitchen.. 1 Daily By Mouth 7)  Metoprolol Tartrate 50 Mg Tabs (Metoprolol Tartrate) .... Take 1 Tablet in Morning and 1 Tablet At Bedtime 8)  Altace 10 Mg  Caps (Ramipril) .Marland Kitchen.. 1 Daily By Mouth 9)  Zetia 10 Mg  Tabs (Ezetimibe) .Marland Kitchen.. 1 Daily By Mouth 10)  Lantus 100 Unit/ml  Soln (Insulin Glargine) .... 90 Units Three Times A Day 11)  Zoloft 50 Mg  Tabs (Sertraline Hcl) .Marland Kitchen.. 1 Daily By Mouth 12)  Nitroglycerin 0.4 Mg Subl (Nitroglycerin) .... One Tablet Under Tongue Every 5 Minutes As Needed For Chest Pain---May Repeat Times Three 13)  Fish Oil 1200 Mg  Caps (Omega-3 Fatty Acids) .... 2 Caps Two Times A Day 14)  Furosemide 80 Mg Tabs (Furosemide) .... Take 1 Tablet Once Daily 15)  Potassium Chloride Crys Cr 20 Meq Cr-Tabs (Potassium Chloride Crys Cr) .... Take One Tablet By Mouth Daily 16)  Vitamin D 50,000 Iu .... Once A Week 17)  Coenzyme Q10 10 Mg Caps (Coenzyme Q10) .... One Daily 18)  Ranitidine Hcl 75 Mg Tabs (Ranitidine Hcl) .... One Tablet Twice A Day 19)  Co Q-10 30 Mg  Caps (Coenzyme Q10) .... 2 Once Daily 20)  Promethazine Hcl 25 Mg  Tabs (Promethazine Hcl) .... 1/2 Tablet To 1 Tablet Up To Every 6 Hours As Needed For Nausea 21)  Aspirin 81 Mg Tbec (Aspirin) .... Take One Tablet By Mouth Daily  Allergies (verified): 1)  ! Celebrex  Past History:  Past Medical History: Last updated: 06/24/2008 SLEEP APNEA (ICD-780.57) CAROTID ARTERY DISEASE (ICD-433.10) CAD, ARTERY BYPASS GRAFT (ICD-414.04) CELLULITIS AND ABSCESS OF LEG EXCEPT FOOT (ICD-682.6) EDEMA LEG (ICD-782.3) KNEE SPRAIN, RIGHT (ICD-844.9) MUSCLE CRAMPS (ICD-729.82) LOW BACK PAIN, CHRONIC (ICD-724.2) GERD (ICD-530.81) HYPERLIPIDEMIA (ICD-272.4) HYPERTENSION (ICD-401.9) HIATAL HERNIA (ICD-553.3) DM (ICD-250.00) FOREIGN BODY, FINGER W/O INFECTION (ICD-915.6)  Past Surgical History: Last updated: 10/18/2008 ETT CARDIOLITE MILD-MOD DISTAL INF/APICAL  ISCHEMIA 12/31/2006 Open Heart Surgery HOSP Acute on Chronic Diastolic HF   RLE Cellulitis  1/6-1/0/9604 ECHO  Mild AS Mild-Mod MS Severe LVH  10/11/08 LE Art and Venous U/S  Art clear  Venous Neg DVT  10/11/08  Family History: Last updated: 06/24/2008 Family History of Diabetes:   Social History: Last updated: 06/24/2008 Married  Tobacco Use - No.  Alcohol Use - no Drug Use - no  Risk Factors: Smoking Status: never (06/24/2008)  Review of Systems       negative other than history of present illness  Vital Signs:  Patient profile:   70 year old male Weight:      256 pounds BMI:     35.83 Pulse rate:    55 / minute BP sitting:   122 / 66  (right arm)  Vitals Entered By: Dreama Saa, CNA (Jul 15, 2009 10:51 AM)  Physical Exam  General:  obese.   Head:  normocephalic and atraumatic Eyes:  PERRLA/EOM intact; conjunctiva and lids normal. Neck:  Neck supple, no JVD. No masses, thyromegaly or abnormal cervical nodes. Chest [name] [name]:  no deformities or breast masses noted Lungs:  Clear bilaterally to auscultation and percussion. Heart:  PMI poorly appreciated, normal S1-S2, systolic murmur along left sternal border, S2 splits. There's a right sided neck sound either referred or bruit. Msk:  decreased ROM.   Pulses:  absent in both lower extremities, baseline Extremities:  trace left pedal edema and trace right pedal edema. 2 small areas on his right anterior leg both uncomplicated and dry. He also has 111 which is slow healing but is dry. No toe or foot lesions.  Neurologic:  Alert and oriented x 3. Skin:  Intact without lesions or rashes. Psych:  Normal affect.   Impression & Recommendations:  Problem # 1:  CAROTID BRUIT (ICD-785.9) Assessment New I see no record of carotid Dopplers. We'll obtain to rule out any obstructive carotid disease.  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 1 daily by mouth    Metoprolol Tartrate 50 Mg Tabs (Metoprolol tartrate) .Marland Kitchen... Take 1 tablet in morning and 1 tablet at bedtime    Altace 10 Mg Caps (Ramipril) .Marland Kitchen... 1 daily by mouth    Nitroglycerin 0.4 Mg Subl (Nitroglycerin) ..... One tablet under tongue every 5 minutes as  needed for chest pain---may repeat times three    Aspirin 81 Mg Tbec (Aspirin) .Marland Kitchen... Take one tablet by mouth daily  Problem # 7:  HYPERTENSION (ICD-401.9) Assessment: Improved  His updated medication list for this problem includes:    Metoprolol Tartrate 50 Mg Tabs (Metoprolol tartrate) .Marland Kitchen... Take 1 tablet in morning and 1 tablet at bedtime    Altace 10 Mg Caps (Ramipril) .Marland Kitchen... 1 daily by mouth    Furosemide 80 Mg Tabs (Furosemide) .Marland Kitchen... Take 1 tablet once daily    Aspirin 81 Mg Tbec (Aspirin) .Marland Kitchen... Take one tablet by mouth daily  Problem # 8:  EDEMA LEG (ICD-782.3) Assessment: Improved  Problem # 9:  HYPERLIPIDEMIA (ICD-272.4)  His updated medication list for this problem includes:    Lipitor 40 Mg Tabs (Atorvastatin calcium) .Marland Kitchen... Take one tablet by mouth daily.    Antara 130 Mg Caps (Fenofibrate micronized) .Marland KitchenMarland KitchenMarland KitchenMarland Kitchen  1 tablet daily by mouth    Zetia 10 Mg Tabs (Ezetimibe) .Marland Kitchen... 1 daily by mouth  Patient Instructions: 1)  Your physician recommends that you schedule a follow-up appointment in: 3 months 2)  Your physician has requested that you have a carotid duplex. This test is an ultrasound of the carotid arteries in your neck. It looks at blood flow through these arteries that supply the brain with blood. Allow one hour for this exam. There are no restrictions or special instructions.

## 2010-04-06 NOTE — Letter (Signed)
Summary: [facility] / F/U DIABETES MGMT / DR. MI  [facility] / F/U DIABETES MGMT / DR. MICHAEL ALTHEIMER   Imported By: Carin Primrose 07/01/2009 16:19:34  _____________________________________________________________________  External Attachment:    Type:   Image     Comment:   External Document

## 2010-04-06 NOTE — Progress Notes (Signed)
Summary: missed appt.  Phone Note Outgoing Call   Call placed by: Larita Fife Via LPN,  October 27, 2009 3:05 PM Summary of Call: [facility], pt. missed todays appt. with Dr. Daleen Squibb. Initial call taken by: Larita Fife Via LPN,  October 27, 2009 3:05 PM     Appended Document: missed appt.    Phone Note Outgoing Call   Call placed by: Larita Fife Via LPN,  October 28, 2009 2:00 PM Summary of Call: Pt. states he missed yesterdays appt. with Dr. Daleen Squibb due to not knowing correct date. He states he will call and reschedule after he checks his schedule. Initial call taken by: Larita Fife Via LPN,  October 28, 2009 2:04 PM

## 2010-04-06 NOTE — Letter (Signed)
Summary: Dr.Michael Altheimer,[facility]  Dr.Michael Altheimer,[facility]   Imported By: Beau Fanny 04/06/2009 13:07:03  _____________________________________________________________________  External Attachment:    Type:   Image     Comment:   External Document

## 2010-04-06 NOTE — Letter (Signed)
Summary: [place] [facility], agricultural at [facility]  [address], [place] [address]   Phone: [phone]  Fax: [phone]      Jul 27, 2009 MRN: [id number]   [facility] [address] [place], [place]  [address]   Dear Mr. Shorkey,  Your test ordered by Selena Batten has been reviewed by your physician (or physician assistant) and was found to be normal or stable. Your physician (or physician assistant) felt no changes were needed at this time.  ____ Echocardiogram  ____ Cardiac Stress Test  ____ Lab Work  _x___ Peripheral vascular study of arms, legs or neck  ____ CT scan or X-ray  ____ Lung or Breathing test  ____ Other:  No change in medical treatment at this time, per Dr. Daleen Squibb.  We will repeat ultrasound in 1 year.  Thank you, Ayano Douthitt Allyne Gee RN    [place] Bing, MD, Lenise Arena.C.Gaylord Shih, MD, F.A.C.C Lewayne Bunting, MD, F.A.C.C Nona Dell, MD, F.A.C.C Charlton Haws, MD, Lenise Arena.C.C

## 2010-04-06 NOTE — Letter (Signed)
Summary: [facility] Diabetes  [facility]   Imported By: Lanelle Bal 03/23/2010 08:33:35  _____________________________________________________________________  External Attachment:    Type:   Image     Comment:   External Document

## 2010-04-12 NOTE — Assessment & Plan Note (Signed)
Summary: rov   Visit Type:  Follow-up Primary Provider:  Shaune Leeks MD  CC:  edema in legs. diabetic bleed in eye. pt has no other complaints today.  History of Present Illness: [name] [name] returns for E and M of his multiple cardiac and vascular problems. His major problem is bleeding in his right eye. An opthalmologist had to stop his Plavix and ASA. He is soon to have cataract surgery onhis left eye.  He denies any angina or change in functional capacity. He has chronic weeping of his legs and severe PVD. Refuses to go back to wound center because of a bad experience. Take extra lasix if weight goes up 3 lbs but for whatever reason takes both in the afternoon.  Current Medications (verified): 1)  Prandin 2 Mg  Tabs (Repaglinide) .Marland Kitchen.. 1 Three Times  A Day By Mouth 2)  Prilosec 20 Mg  Cpdr (Omeprazole) .Marland Kitchen.. 1 Daily By Mouth 3)  Antara 130 Mg  Caps (Fenofibrate Micronized) .Marland Kitchen.. 1 Tablet Daily By Mouth 4)  Metformin Hcl 1000 Mg Tabs (Metformin Hcl) .... Take 1 Tablet By Mouth Two Times A Day 5)  Plavix 75 Mg  Tabs (Clopidogrel Bisulfate) .Marland Kitchen.. 1 Daily By Mouth Hold 6)  Metoprolol Tartrate 50 Mg Tabs (Metoprolol Tartrate) .... Take 1 Tablet in Morning and 1 Tablet At Bedtime 7)  Altace 10 Mg  Caps (Ramipril) .Marland Kitchen.. 1 Daily By Mouth 8)  Zetia 10 Mg  Tabs (Ezetimibe) .Marland Kitchen.. 1 Daily By Mouth 9)  Lantus 100 Unit/ml  Soln (Insulin Glargine) .... 90 Units Three Times A Day 10)  Zoloft 50 Mg  Tabs (Sertraline Hcl) .Marland Kitchen.. 1 Daily By Mouth 11)  Nitroglycerin 0.4 Mg Subl (Nitroglycerin) .... One Tablet Under Tongue Every 5 Minutes As Needed For Chest Pain---May Repeat Times Three 12)  Fish Oil 1200 Mg Caps (Omega-3 Fatty Acids) .... 2 Caps Two Times A Day 13)  Furosemide 80 Mg Tabs (Furosemide) .... Take 1 Tablet Once Daily 14)  Vitamin D 50,000 Iu .... Once A Week 15)  Coenzyme Q10 10 Mg Caps (Coenzyme Q10) .... One Daily 16)  Ranitidine Hcl 75 Mg Tabs (Ranitidine Hcl) .... One Tablet Twice A  Day 17)  Co Q-10 30 Mg  Caps (Coenzyme Q10) .... 2 Once Daily 18)  Crestor 20 Mg Tabs (Rosuvastatin Calcium) .... Take 1 Tablet By Mouth At Bedtime  Allergies (verified): 1)  ! Celebrex  Past History:  Past Medical History: Last updated: 06/24/2008 SLEEP APNEA (ICD-780.57) CAROTID ARTERY DISEASE (ICD-433.10) CAD, ARTERY BYPASS GRAFT (ICD-414.04) CELLULITIS AND ABSCESS OF LEG EXCEPT FOOT (ICD-682.6) EDEMA LEG (ICD-782.3) KNEE SPRAIN, RIGHT (ICD-844.9) MUSCLE CRAMPS (ICD-729.82) LOW BACK PAIN, CHRONIC (ICD-724.2) GERD (ICD-530.81) HYPERLIPIDEMIA (ICD-272.4) HYPERTENSION (ICD-401.9) HIATAL HERNIA (ICD-553.3) DM (ICD-250.00) FOREIGN BODY, FINGER W/O INFECTION (ICD-915.6)  Past Surgical History: Last updated: 10/18/2008 ETT CARDIOLITE MILD-MOD DISTAL INF/APICAL  ISCHEMIA 12/31/2006 Open Heart Surgery HOSP Acute on Chronic Diastolic HF   RLE Cellulitis  0/4-07/07/979 ECHO Mild AS Mild-Mod MS Severe LVH  10/11/08 LE Art and Venous U/S  Art clear  Venous Neg DVT  10/11/08  Family History: Last updated: 06/24/2008 Family History of Diabetes:   Social History: Last updated: 06/24/2008 Married  Tobacco Use - No.  Alcohol Use - no Drug Use - no  Risk Factors: Smoking Status: never (06/24/2008)  Clinical Reports Reviewed:  Cardiac Cath:  10/10/2004: Cardiac Cath Findings:   CONCLUSION:  1.  Coronary artery disease status post coronary artery bypass graft surgery  in 1998.  2.  Severe native vessel disease with total occlusion of the left anterior      descending and circumflex arteries, 30% narrowing in the proximal to mid      right coronary artery and total occlusion of the posterior descending      branch of the right coronary artery.  3.  Patent sequential vein graft to the marginal and posterolateral branches      of the circumflex artery with 30% narrowing in the distal limb, patent      sequential LIMA graft to the diagonal branch of the LAD and the LAD, but       with diffuse disease in the distal LAD and total occlusion of the LAD at      its distal portion, and total occlusion of the saphenous vein graft to      the right coronary artery (old).  4.  Normal LV function.   RECOMMENDATIONS:  The patient's coronary anatomy has not changed from prior  studies.  He does have potential ischemia in the distal LAD.  The posterior  descending branch has actually filled fairly well through the posterolateral  branch and the distal limb of the old vein graft.  We will plan reassurance  and continued medical therapy. Charlies Constable, M.D. [facility]  09/22/2002: Cardiac Cath Findings:   1. LV 143/8/25.  EF greater than 70% without regional Lesta Limbert motion     abnormality.  2. Left main:  Angiographically normal.  3. LAD:  Large vessel giving rise to two diagonal branches.  The mid vessel     is diffusely diseased with up to 80% stenosis with the most severe     disease being between the first and second diagonal branch.  The distal     LAD is severely diffusely diseased as is the second diagonal branch.  The     sequential LIMA graft to second diagonal and LAD is patent.  However,     again, the runoff vessels are severely and diffusely diseased.  4. Ramus intermedius:  Large vessel which is occluded at its ostium.  The     sequential saphenous vein graft to ramus intermedius and OM 2 is widely     patent to the ramus.  5. Circumflex:  The circumflex is a moderate sized vessel giving rise to two     obtuse marginals.  The AV groove circumflex is occluded after the takeoff     of the first marginal.  The sequential saphenous vein graft to ramus     intermedius and OM 2 is patent.  There is a 20% stenosis just proximal to     the anastomosis with OM 2.  6. RCA:  Large, dominant vessel.  There is a 30% stenosis in the proximal     vessel.  The previously placed stent in the mid RCA is widely patent.     The PDA is occluded proximally with bridging collaterals.  The  saphenous     vein graft to PDA has previously been documented to be occluded and was     not visualized.   IMPRESSION/RECOMMENDATIONS:  Patent left internal mammary artery to left  anterior descending and second diagonal branch.  However, the target vessels  to both are severely and diffusely diseased.  There was no  improvement in these vessels after administration of nitroglycerin via the  left internal mammary artery graft.  His anatomy is otherwise unchanged  compared with prior catheterization.  Will  add long-acting nitrates to his  medical regimen.                                           Salvadore Farber, M.D  Carotid Doppler:  07/26/2009:   The following velocity measurements were obtained:                     PEAK SYSTOLIC/END DIASTOLIC   RIGHT   ICA:                        119/17cm/sec   CCA:                        97/12cm/sec   SYSTOLIC ICA/CCA RATIO:     1.2   DIASTOLIC ICA/CCA RATIO:    1.4   ECA:                        138cm/sec    LEFT   ICA:                        70/15cm/sec   CCA:                        126/11cm/sec   SYSTOLIC ICA/CCA RATIO:     0.6   DIASTOLIC ICA/CCA RATIO:    1.4   ECA:                        64cm/sec    Findings:    RIGHT CAROTID ARTERY: Moderate amount of eccentric calcified plaque   is present at the level of the right carotid bulb, proximal   internal carotid artery and external carotid artery origin.  Based   on velocities, estimated right ICA stenosis is less than 50%.    RIGHT VERTEBRAL ARTERY:  Antegrade flow with normal wave form.    LEFT CAROTID ARTERY: Eccentric calcified plaque present at the   level of the distal common carotid artery and proximal ICA.   Overall plaque burden is relatively mild and less prominent   compared to the right carotid bifurcation.  Estimated left ICA   stenosis is less than 50%.    LEFT VERTEBRAL ARTERY:  Antegrade flow with normal wave form.    IMPRESSION:   Bilateral atherosclerotic  plaque at the carotid bifurcations, right   greater than left.  Based on velocities, estimated bilateral ICA   stenoses are less than 50%.    Read By:  Irish Lack,  M.D.   Released By:  Irish Lack,  M.D.  09/24/2007:  Technically difficult study Bilateral caroti disease appears stable 40 - 59% bilateral ICA stenosis  12/31/2006:  Technically difficult study Carotid disease appears stable, bilaterally 40 - 59% ICA stenosis  Nuclear Study:  12/31/2006:  Exercise capacity - Poor exercise capacity  Blood Pressure - Normal BP response  Clinical Symptoms - There is dyspnea  ECG impression - No significant ST segment change suggestive of ischemia  Overall impression -  Abnormal stress nuclear study with mild to moderate distal inferior and apical ischemia; possible transient ischemic LV cavity dilatation.  09/27/2004:  Abnormal Myoview study with marked inferior Kaizley Aja ischemia from apex to base. Gated ejection fraction was 63%. The patient  will be referred for heart catheterization.   Review of Systems       negative other than HPI  Vital Signs:  Patient profile:   70 year old male Height:      71 inches Weight:      254.50 pounds BMI:     35.62 Pulse rate:   54 / minute BP sitting:   114 / 64  (left arm) Cuff size:   large  Physical Exam  General:  obese.  NAD Head:  normocephalic and atraumatic Eyes:  PERRLA/EOM intact; conjunctiva and lids normal. Neck:  Neck supple, no JVD. No masses, thyromegaly or abnormal cervical nodes. Chest Ananth Fiallos:  no deformities or breast masses noted Lungs:  Clear bilaterally to auscultation and percussion. Heart:  PMI nondisplaced, RRR, Nl S1 and S2, AS Murmur, S2 splits, R Carotid bruit or referred sound Msk:  Back normal, normal gait. Muscle strength and tone normal. Pulses:  DP 1+ bilaterally Extremities:  chronic and pitting edema with pretibial ulceration and weeping Neurologic:  Alert and oriented x 3. Skin:  Intact without  lesions or rashes. Psych:  Normal affect.   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 Does not need to stop for cataract removal. See bback in 3 mos. The following medications were removed from the medication list:    Aspirin 81 Mg Tbec (Aspirin) .Marland Kitchen... Take one tablet by mouth daily His updated medication list for this problem includes:    Plavix 75 Mg Tabs (Clopidogrel bisulfate) .Marland Kitchen... 1 daily by mouth hold    Metoprolol Tartrate 50 Mg Tabs (Metoprolol tartrate) .Marland Kitchen... Take 1 tablet in morning and 1 tablet at bedtime    Altace 10 Mg Caps (Ramipril) .Marland Kitchen... 1 daily by mouth    Nitroglycerin 0.4 Mg Subl (Nitroglycerin) ..... One tablet under tongue every 5 minutes as needed for chest pain---may repeat times three  Problem # 6:  EDEMA LEG (ICD-782.3) Assessment: Unchanged Advised to take lasix in am and extra 80mg  in afternoon if weight up3 lbs  Problem # 7:  HYPERLIPIDEMIA (ICD-272.4)  His updated medication list for this problem includes:    Antara 130 Mg Caps (Fenofibrate micronized) .Marland Kitchen... 1 tablet daily by mouth    Zetia 10 Mg Tabs (Ezetimibe) .Marland Kitchen... 1 daily by mouth    Crestor 20 Mg Tabs (Rosuvastatin calcium) .Marland Kitchen... Take 1 tablet by mouth at bedtime  Problem # 8:  HYPERTENSION (ICD-401.9) Assessment: Improved  The following medications were removed from the medication list:    Aspirin 81 Mg Tbec (Aspirin) .Marland Kitchen... Take one tablet by mouth daily His updated medication list for this problem includes:    Metoprolol Tartrate 50 Mg Tabs (Metoprolol  tartrate) .Marland Kitchen... Take 1 tablet in morning and 1 tablet at bedtime    Altace 10 Mg Caps (Ramipril) .Marland Kitchen... 1 daily by mouth    Furosemide 80 Mg Tabs (Furosemide) .Marland Kitchen... Take 1 tablet once daily  Problem # 9:  DM (ICD-250.00)  The following medications were removed from the medication list:    Aspirin 81 Mg Tbec (Aspirin) .Marland Kitchen... Take one tablet by mouth  daily His updated medication list for this problem includes:    Prandin 2 Mg Tabs (Repaglinide) .Marland Kitchen... 1 three times  a day by mouth    Metformin Hcl 1000 Mg Tabs (Metformin hcl) .Marland Kitchen... Take 1 tablet by mouth two times a day    Altace 10 Mg Caps (Ramipril) .Marland Kitchen... 1 daily by mouth    Lantus 100 Unit/ml Soln (Insulin glargine) .Marland Kitchen... 90 units three times a day  Appended Document: rov    Clinical Lists Changes  Medications: Changed medication from FUROSEMIDE 80 MG TABS (FUROSEMIDE) take 1 tablet once daily to FUROSEMIDE 80 MG TABS (FUROSEMIDE) take 1 tablet every morning. - Signed Added new medication of ALEVE 220 MG TABS (NAPROXEN SODIUM) Take as directed for discomfort Changed medication from PLAVIX 75 MG  TABS (CLOPIDOGREL BISULFATE) 1 daily by mouth HOLD to PLAVIX 75 MG  TABS (CLOPIDOGREL BISULFATE) 1 daily by mouth Added new medication of ASPIRIN 81 MG TBEC (ASPIRIN) Take one tablet by mouth daily Rx of FUROSEMIDE 80 MG TABS (FUROSEMIDE) take 1 tablet every morning.;  #60 x 6;  Signed;  Entered by: Lisabeth Devoid RN;  Authorized by: Gaylord Shih, MD, [facility];  Method used: Electronically to [facility]*, [address], [place], [place]  [address], Ph: [phone], Fax: [phone] Observations: Added new observation of PI CARDIO: Your physician recommends that you schedule a follow-up appointment in: 3 months with Dr. Daleen Squibb Your physician has recommended you make the following change in your medication: You may take and extra lasix in the afternoon if you have a weight gain of 3 pounds or more weeping YOU DO NOT HAVE TO STOP ASPIRIN OR PLAVIX FOR YOUR  CATARACT SURGERY. CONTACT YOUR OPTHAMOLOGIST ABOUT RESTARTING EITHER YOUR ASPIRIN OR PLAVIX AS SOON AS POSSIBLE.  YOU SHOULD BE TAKING ONE OF THE OTHER FOR YOUR HEART. (04/04/2010 11:34)    Prescriptions: FUROSEMIDE 80 MG TABS (FUROSEMIDE) take 1 tablet every morning.  #60 x 6   Entered by:   Lisabeth Devoid RN   Authorized by:   Gaylord Shih, MD, [facility]   Signed by:   Lisabeth Devoid RN on 04/04/2010   Method used:   Electronically to        [facility]* (retail)       [address]       [place], [place]  [address]       Ph: [phone]       Fax: [phone]   RxID:   [id number]     Patient Instructions: 1)  Your physician recommends that you schedule a follow-up appointment in: 3 months with Dr. Daleen Squibb 2)  Your physician has recommended you make the following change in your medication: You may take and extra lasix in the afternoon if you have a weight gain of 3 pounds or more weeping 3)  YOU DO NOT HAVE TO STOP ASPIRIN OR PLAVIX FOR YOUR CATARACT SURGERY. 4)  CONTACT YOUR OPTHAMOLOGIST ABOUT RESTARTING EITHER YOUR ASPIRIN OR PLAVIX AS SOON AS POSSIBLE.  YOU SHOULD BE TAKING ONE OF THE OTHER FOR YOUR HEART.

## 2010-05-24 ENCOUNTER — Other Ambulatory Visit: Payer: Self-pay | Admitting: *Deleted

## 2010-05-24 MED ORDER — NITROGLYCERIN 0.4 MG SL SUBL: 0.4000 mg | SUBLINGUAL_TABLET | SUBLINGUAL | Status: DC | PRN

## 2010-06-10 LAB — PROTIME-INR
INR: 1 (ref 0.00–1.49)
Prothrombin Time: 13.3 seconds (ref 11.6–15.2)

## 2010-06-10 LAB — BASIC METABOLIC PANEL
BUN: 35 mg/dL — ABNORMAL HIGH (ref 6–23)
CO2: 31 mEq/L (ref 19–32)
Calcium: 9.4 mg/dL (ref 8.4–10.5)
Calcium: 9.6 mg/dL (ref 8.4–10.5)
Chloride: 90 mEq/L — ABNORMAL LOW (ref 96–112)
Chloride: 91 mEq/L — ABNORMAL LOW (ref 96–112)
Creatinine, Ser: 1.58 mg/dL — ABNORMAL HIGH (ref 0.4–1.5)
Creatinine, Ser: 2.37 mg/dL — ABNORMAL HIGH (ref 0.4–1.5)
GFR calc Af Amer: 53 mL/min — ABNORMAL LOW (ref >60)
Glucose, Bld: 234 mg/dL — ABNORMAL HIGH (ref 70–99)
Potassium: 4.1 mEq/L (ref 3.5–5.1)
Potassium: 4.9 mEq/L (ref 3.5–5.1)
Sodium: 132 mEq/L — ABNORMAL LOW (ref 135–145)
Sodium: 134 mEq/L — ABNORMAL LOW (ref 135–145)

## 2010-06-10 LAB — COMPREHENSIVE METABOLIC PANEL
BUN: 20 mg/dL (ref 6–23)
CO2: 28 mEq/L (ref 19–32)
Calcium: 9.3 mg/dL (ref 8.4–10.5)
Creatinine, Ser: 1.43 mg/dL (ref 0.4–1.5)
GFR calc non Af Amer: 49 mL/min — ABNORMAL LOW (ref >60)
Glucose, Bld: 389 mg/dL — ABNORMAL HIGH (ref 70–99)
Total Protein: 6.1 g/dL (ref 6.0–8.3)

## 2010-06-10 LAB — CBC
Hemoglobin: 11.9 g/dL — ABNORMAL LOW (ref 13.0–17.0)
MCHC: 34 g/dL (ref 30.0–36.0)
MCV: 92.6 fL (ref 78.0–100.0)
RBC: 3.8 MIL/uL — ABNORMAL LOW (ref 4.22–5.81)
RDW: 15.3 % (ref 11.5–15.5)

## 2010-06-10 LAB — GLUCOSE, CAPILLARY
Glucose-Capillary: 189 mg/dL — ABNORMAL HIGH (ref 70–99)
Glucose-Capillary: 200 mg/dL — ABNORMAL HIGH (ref 70–99)
Glucose-Capillary: 214 mg/dL — ABNORMAL HIGH (ref 70–99)
Glucose-Capillary: 231 mg/dL — ABNORMAL HIGH (ref 70–99)
Glucose-Capillary: 273 mg/dL — ABNORMAL HIGH (ref 70–99)
Glucose-Capillary: 278 mg/dL — ABNORMAL HIGH (ref 70–99)
Glucose-Capillary: 294 mg/dL — ABNORMAL HIGH (ref 70–99)

## 2010-06-10 LAB — TSH: TSH: 1.085 u[IU]/mL (ref 0.350–4.500)

## 2010-07-18 NOTE — Assessment & Plan Note (Signed)
[facility]                            CARDIOLOGY OFFICE NOTE   NAME:[name], [name] Neer                         MRN:          [id number]  DATE:12/26/2006                            DOB:          1940/04/21    [name] [name] comes in today for further management of his coronary artery  disease.   1. He has previous coronary bypass grafting.  Last catheterization on      October 10, 2004, for some inferior wall ischemia, showed a patent      LIMA to a diagonal and distal LAD, totally clear LAD at the apex,      patent sequential vein graft to an obtuse marginal-2 and 3, totally      occluded vein graft to a distal right, and a patent proximal RCA      which had previously been stented.  His EF was 60% with no wall      motion abnormalities.  We felt the Myoview was false positive or      that he did have some inferior apical ischemia from his distally      occluded LAD.  2. Carotid artery disease.  His last carotid Dopplers showed a 40-59%      bilateral internal carotid artery stenosis with antegrade flow in      both vertebrals.  3. His biggest problem is he is not sleeping.  He sleeps in a      recliner.  He was evaluated for sleep apnea about 4 years ago.  He      is very tired during the day.  He knows he needs to loose some      weight as well.  He was evaluated by Dr. Danise Mina several years      ago for sleep apnea.  He would like to be reassessed by a different      pulmonary physician.   MEDICATIONS:  Listed in the maintenance medication list.  They are  unchanged since his last visit.   PHYSICAL EXAMINATION:  VITAL SIGNS:  His blood pressure today is 142/68.  His pulse is 65 and sinus rhythm, 1st degree AV block.  His EKG is  unchanged.  His weight is 279, up 6.  HEENT:  He has a ruddy complexion which is baseline.  His HEENT  otherwise is unremarkable.  NECK:  Carotid upstrokes are equal bilaterally with soft systolic sounds  bilaterally.   Thyroid is not enlarged.  Trachea is midline.  LUNGS:  Clear.  HEART:  Reveals a nondisplaced PMI.  He has a soft S1 S2.  ABDOMEN:  Protuberant with good bowel sounds.  Organomegaly was not  assessed.  EXTREMITIES:  Reveal 1 plus edema.  Pulses are intact.  He has pretty  good capillary refill.  His toes are warm.   I had a long talk with [name] [name] and his wife.  I have recommended an  adenosine or an exercise/rest stress Myoview as well as carotid Doppler  followup.  I have made no changes in his medical program.  I will have  Dr. Marcelyn Bruins of our sleep division see him in consultation.  Hopefully, he can tolerate some program to improve his sleeping.  He  knows he needs to loose weight.   Otherwise, I will see him back in 6 months.     [name] Squibb, MD, [facility]  Electronically Signed    TCW/MedQ  DD: 12/26/2006  DT: 12/27/2006  Job #: [id number]   cc:   Veverly Fells. Altheimer, M.D.

## 2010-07-18 NOTE — Discharge Summary (Signed)
NAMEBION, TODOROV NO.:  [id number]   MEDICAL RECORD NO.:  [id number]          PATIENT TYPE:  INP   LOCATION:  4739                         FACILITY:  [facility]   PHYSICIAN:  Jesse Sans. Wall, MD, FACCDATE OF BIRTH:  06-15-1940   DATE OF ADMISSION:  10/08/2008  DATE OF DISCHARGE:  10/11/2008                               DISCHARGE SUMMARY   PRIMARY CARDIOLOGIST:  Maisie Fus C. Daleen Squibb, MD, [facility]   PRIMARY CARE PHYSICIAN:  Arta Silence, MD   DISCHARGE DIAGNOSES:  1. Acute on chronic diastolic heart failure.      a.     Last assessment of left ventricular function,       catheterization in 2006, ejection fraction 60% with no areas of       hypokinesis.      b.     Results from transthoracic echocardiogram from this       admission pending  2. Right lower extremity cellulitis.   SECONDARY DIAGNOSES:  1. Coronary artery disease.      a.     Status post coronary artery bypass grafting, 1998.      b.     Last catheterization on October 10, 2004, potential ischemia       in the distal left anterior descending, otherwise unchanged from       previous catheterization, plan to reassurance and continued       medical therapy.  2. Sleep apnea, intolerant to CPAP.  3. Carotid artery disease, nonobstructive.  4. Chronic lower extremity edema.  5. Chronic low back pain.  6. Gastroesophageal reflux disease.  7. Hyperlipidemia, followed by Dr. Leslie Dales.  8. Hypertension.  9. Hiatal hernia.  10.Type 2 diabetes mellitus, requiring insulin.   ALLERGIES:  CELEBREX (rash).   PROCEDURES:  1. Transthoracic echocardiogram, October 11, 2008, results pending.  2. Lower extremity Dopplers, October 11, 2008, results pending.   HISTORY OF PRESENT ILLNESS:  [name] is a 70 year old Caucasian male  well known to Dr. Daleen Squibb with the above medical history who presents with  complaints of worsening of chronic lower extremity edema which has been  resistant to outpatient Lasix therapy.  The  patient denies any  fever/chills/sweats.  He has had some orthopnea with minimal improvement  on Lasix, he was taking 40 mg p.o. daily with potassium supplementation  and decrease in weight approximately equal to 1 pound.  He presented to  the [facility] on October 08, 2008, with weeping/busted blisters in  his lower extremities and cellulitis in the right lower extremity by  exam.  He was thought to need admission to [facility] for IV diuresis.   HOSPITAL COURSE:  The patient was admitted and underwent IV diuresis  with significant improvement in symptoms and approximately negative 5400  mL fluid over hospital course.  Weight also down approximately 6 kg from  126.7 kg to 120.9 kg on October 11, 2008.  However, creatinine bump from  1.43 on admission to 2.37 in a.m. of October 11, 2008.  The patient is  deemed stable for discharge with followup appointment with  Wound Center  as well as with Dr. Daleen Squibb within 1 week.  However, repeat BMET in p.m.,  results pending.  If creatinine stable/decreased, the patient will be  discontinued on October 11, 2008.  No 2-D echocardiogram and lower  extremity Dopplers completed or to be completed prior to discharge,  results pending.  See addendum for results.   DISCHARGE LABORATORY DATA:  Blood glucose 189-363.  PT 13.3 and INR 1.0.  WBC 5.2, HGB 11.9, HCT 35.1, and PLT count 224.  Sodium 131, potassium  4.6, chloride 91, CO2 of 31, BUN 35, and creatinine 2.37.  Liver  function tests within normal limits.  Calcium 9.6.  TSH 1.085.   FOLLOWUP PLANS AND APPOINTMENTS:  1. Thomas C. Wall, MD, [facility], (BMET to be drawn as well), October 14, 2008 at 4 p.m.  2. [facility], October 15, 2008, at 2 p.m.   DISCHARGE MEDICATIONS:  Please see computer list.   OUTSTANDING LABS AND STUDIES:  1. A 2-D transthoracic echocardiogram.  2. Lower extremity Dopplers  3. Basic metabolic panel.   DURATION OF DISCHARGE ENCOUNTER:  Including  physician time 45 minutes.      Jarrett Ables, PAC      [name] Squibb, MD, [facility]  Electronically Signed    MS/MEDQ  D:  10/11/2008  T:  10/12/2008  Job:  [id number]   cc:   [name] Squibb, MD, [facility]  Arta Silence, MD  Veverly Fells. Altheimer, M.D.

## 2010-07-18 NOTE — Consult Note (Signed)
NAMEELIZARDO, [name] [name] NO.:  [id number]   MEDICAL RECORD NO.:  [id number]          PATIENT TYPE:  REC   LOCATION:  FOOT                         FACILITY:  [facility]   PHYSICIAN:  Lenon Curt. Chilton Si, M.D.  DATE OF BIRTH:  Jan 23, 1941   DATE OF CONSULTATION:  10/15/2008  DATE OF DISCHARGE:                                 CONSULTATION   REFERRED BY:  [name] Sans. Wall, MD, [facility]   PROBLEM:  Ulcerations of the right leg.   HISTORY:  A 70 year old male referred by Dr. Juanito Doom for ulcerations of  the right leg, reports for his first [facility] visitation today.  [name] [name] states that the ulcerations appeared following a tremendous  swelling of his legs.  There were blisters on the leg.  The blisters  opened up, peeled back, and exposed a raw skin surface.  This has  occurred intermittently in the past, he has had cellulitis of his leg,  reportedly in November 2009, December 2009, and February 2010.  Dr. Daleen Squibb  has put the patient to on an antibiotic.  Family was not even familiar  with this when they entered today.  However, upon calling the drug  store, it appears that he is taking Keflex 500 mg t.i.d. for at least a  7-day period of time.   In addition to this, the patient has been using a home remedy consisting  of a cream that is used for veterinary purposes on large animals.  He  finds the cream rather soothing and says everybody agrees that is a good  cream.  He has no idea of what is in it.  The patient was recently  hospitalized for his edema.  His wife says that 16 pounds of fluid were  taken off him, this was the time that the blisters occurred and times in  the past, he has used Silvadene and zinc oxide fanny cream that costs  $28 a tube.  Again, they do not have any idea what was in the fanny  cream tube.   He complains of excessive weeping in the legs and continued pain and  discomfort around the ulcerations.   He states his arterial circulation has been  checked in the past and has  been reported normal.   PAST MEDICAL HISTORY:  Diabetes mellitus, congestive heart failure,  chronic edema, ulcer at the tip of the right third toe for at least 1  year, coronary artery disease with history of coronary artery bypass  grafting, sleep apnea intolerance to CPAP, carotid artery disease,  nonobstructive, low back pain, GERD, hyperlipidemia, hypertension,  hiatal hernia, insulin requiring diabetes mellitus.   CURRENT MEDICATIONS:  1. Prandin 2 mg t.i.d.  2. Crestor 20 mg nightly.  3. Prilosec 20 mg daily.  4. Antara 130 one daily.  5. Metformin 1000 mg b.i.d.  6. Imdur 30 mg 1 daily.  7. Plavix 75 mg daily.  8. Metoprolol tartrate 50 mg a.m. and nightly.  9. Altace 10 mg daily.  10.Zetia 10 mg daily.  11.Lantus 45 units b.i.d.  12.Zoloft 50 mg  daily.  13.Januvia 100 mg daily.  14.Nitroglycerin 0.4 mg p.r.n.  15.Fish oil 1200 mg 2 caps twice daily.  16.Potassium chloride 20 mEq 2 tablets daily.  17  Furosemide 80 mg 1 daily.  1. Keflex 500 mg t.i.d. for at least another week.   ALLERGIES:  Reported to CELEBREX.   REVIEW OF SYSTEMS:  Shortness of breath, oxygen dependency.  No  palpitations or chest pains.  Denies any GI distress, change in bowel  habits, diarrhea, or constipation.  No dysuria.  No known renal  disorder, although BUN and creatinine from hospital reports are 35 and  2.37 respectively, uncertain whether this is related to chronic renal  disorder or the diuresis that took place.  He has chronic  musculoskeletal complaints and low back pain.  There is some diminished  sensation in the legs.   PHYSICAL EXAMINATION:  VITAL SIGNS:  Temperature 94.4, pulse 59,  respirations 19, blood pressure 129/70.  SKIN:  There are shallow ulcerations x3 of the right shin area with a  beefy red center.  There is a pink inflamed appearance of the skin  present around this to about 6 inches.  Area is tender to touch, simply  wiping the  area, to get the culture was terribly painful for the  patient.  HEAD, EYES, EARS, NOSE, AND THROAT:  Unremarkable.  CHEST:  Few crackles bilaterally.  HEART:  Regular rhythm, mild bradycardia.  ABDOMEN:  Nontender.  No organomegaly.  EXTREMITIES:  No significant deformities.  There is a small ulcer at the  tip of the right third toe and some surrounding callus there.  The skin  lesions of the right shin are described above.  NEUROLOGIC:  Mild decrease to touch sensation of the legs.   PLAN:  Gentamicin ointment, Adaptic, and gauze were applied to his right  leg.  He will continue his Keflex.  He is to return in 1 week for  reexamination and reevaluation of these wounds and decisions about  whether additional studies or testing are needed.  Culture report  hopefully will return during that time frame.   ICD-9:  707.19, ulcer of the leg.   CPT [id number].      Lenon Curt Chilton Si, M.D.  Electronically Signed     AGG/MEDQ  D:  10/15/2008  T:  10/16/2008  Job:  [id number]   cc:   Thomas C. Wall, MD, [facility]

## 2010-07-18 NOTE — Assessment & Plan Note (Signed)
[facility]                             PULMONARY OFFICE NOTE   NAME:[name] [name], [name] [name]                         MRN:          [id number]  DATE:01/06/2007                            DOB:          1940-09-23    HISTORY OF PRESENT ILLNESS:  The patient is a 70 year old white male who  I have asked to see for management of obstructive sleep apnea. The  patient was diagnosed with this in 2004 where he was found to have an  apnea/hypopnea index of 39 events per hour and significant O2  desaturation. The patient was seen by Dr. Delford Field and was placed on CPAP.  However, had great difficulty with this and felt that he could not wear  it. Currently, the patient typically goes to bed between 10 and 11 and  gets up at 6 a.m. to start his day. He is not rested upon arising. He  has been noted to have loud snoring as well as pauses in his breathing  during sleep. The patient has very poor sleep hygiene where he will  often sleep in a chair during the night, where he sleeps much better.  The patient is semi-retired but does have significant inappropriate  daytime sleepiness as well as dosing during the day during with any  period of inactivity. He has also sleepiness with driving long distance.  Of note, he is weight is about 30 pounds over the last few years. I  asked the patient in great detail what presented the largest obstacle to  tolerance of CPAP. The patient states that he could not stand anything  on his face because it felt like an irritation, but it also caused  claustrophobia. It also felt the pressure was too much. However, the  patient was not aware of the ramp feature on the CPAP machine and did  not use this.   PAST MEDICAL HISTORY:  Significant for:  1. Hypertension.  2. Diabetes.  3. Dyslipidemia.  4. History of coronary disease with CABG in 1998. The patient also had      percutaneous intervention in 2003.  5. History of sleep apnea as stated  above.   CURRENT MEDICATIONS:  1. Aspirin 81 mg daily.  2. Januvia 100 mg daily.  3. Toprol-XL 50 mg b.i.d.  4. Metformin 1000 mg b.i.d.  5. Xanax unknown dose 2 tablets b.i.d.  6. Crestor 20 mg every night.  7. Isordil 30 mg every night.  8. Antara 130 mg every night.  9. Altace 10 mg every night.  10.Plavix 75 mg every night.  11.Actos 45 mg every night.  12.Zetia 10 mg every night.  13.Hydrochlorothiazide 25 mg 2 q.a.m.  14.Zoloft 50 mg daily.  15.Lantus in varying doses.  16.Prandin 2 mg t.i.d.   THE PATIENT HAS INTOLERANCE TO CELEBREX.   SOCIAL HISTORY:  The patient is married and has children. He has never  smoked.   FAMILY HISTORY:  Remarkable for his mother having emphysema, father  having heart disease.   REVIEW OF SYSTEMS:  As per history of present illness. Also see patient  intake form in the chart.   PHYSICAL EXAMINATION:  In general, he is a morbidly obese white male in  no acute distress. Blood pressure is 132/70, pulse 61, temperature 98,  weight 278 pounds. He is 5 feet 10 inches tall. O2 saturation on room  air is 96%.  HEENT:  Pupils are equal, round, and reactive to light and  accommodation. Extraocular muscles are intact. Nares are congested with  turbinate hypertrophy. Oropharynx shows a huge tongue, and I am unable  to see his posterior pharyngeal anatomy secondary to gagging and also  his very large tongue.  NECK:  Is large and difficulty to assess for JVD. There is no obvious  thyromegaly or lymphadenopathy.  CHEST:  Reveals decreased breath sounds throughout but clear otherwise.  CARDIAC EXAM:  Reveals regular rate and rhythm with a 2/6 systolic  murmur.  ABDOMEN:  Is soft, nontender with good bowel sounds.  GENITAL EXAM, RECTAL EXAM, BREAST EXAM:  Was not and not indicated.  Lower extremities:  Show 1+ edema. Pulses were intact distally.  NEUROLOGICAL:  Alert and oriented with no obvious observable motor  defects.   IMPRESSION:  1.  History of severe obstructive sleep apnea in 2004. More than      likely, the patient's sleep apnea is worse over this interim. He is      clearly symptomatic during the day and has significant obesity and      abnormal upper airway anatomy. I had a very long discussion with      him about sleep apnea, and I have tried to pass on to him the      urgency of this situation with his underlying heart disease and      comorbid medical problems. Treatment for this should focus      primarily on weight loss as well as CPAP, but he could also      consider tracheostomy as well as maxillomandibular advancement.      These treatments really are not optimal for him. I think at this      point in time that I would like to try to work on pressure delivery      and try to desensitize him to the CPAP system. I think he will      probably do better on BiPAP, but unfortunately, he is going to need      a full-face mask given his nasal anatomy and the fact that he is a      very large mouth breather. The patient is agreeable to trying this.   PLAN:  1. Will initiate BiPAP with an inspiratory pressure of 10 and an      expiratory pressure of 7. I made it clear to the patient this will      not totally treat his sleep apnea but will just be a start to try      and get used to the machine.  2. Work on desensitization during the day by wearing the machine two      or three times at 30 minutes each to try and become accustomed to      the mask.  3. I have given the patient a prescription for Ambien 12.5 mg CR 1      every night to be used with the CPAP. I told him not to take this      if he is not going to wear his CPAP.  4. The patient will be instructed on the  use of the ramp feature on      the machine and how it can be set at 15, 30 or 45 minutes delay.  5. The patient will follow up in 4 weeks or sooner if there are      problems.     Barbaraann Share, MD,FCCP  Electronically Signed    KMC/MedQ   DD: 01/06/2007  DT: 01/07/2007  Job #: [id number]   cc:   [name] Squibb, MD, [facility]  Arta Silence, MD

## 2010-07-18 NOTE — H&P (Signed)
[name], [name] NO.:  [id number]   MEDICAL RECORD NO.:  [id number]          PATIENT TYPE:  INP   LOCATION:  4739                         FACILITY:  [facility]   PHYSICIAN:  Jesse Sans. Wall, MD, FACCDATE OF BIRTH:  Oct 24, 1940   DATE OF ADMISSION:  10/08/2008  DATE OF DISCHARGE:                              HISTORY & PHYSICAL   CHIEF COMPLAINT:  Swelling in my feet and shortness of breath.   HISTORY OF PRESENT ILLNESS:  [name] [name] is a delightful 70 year old  married white male, patient well-known to me, who has had progressive  edema resistant to initiation of outpatient furosemide.  Please see my  EMR note from September 21, 2008 when I saw him with this new problem.   He complains of his legs being tight and sore.  He has had no fever,  chills, or sweats.  He has had some orthopnea and the Lasix has seem to  help that.  He is currently on 40 mg a day with potassium  supplementation.  He has only lost 1 pound, however.   He came to the office in [place] today.  He has weeping and busted  blisters of his lower extremities and cellulitis by exam.  He is being  admitted for intravenous diuretics, bedrest, and antibiotic therapy.   His past medical history is well-outlined in the chart.  He has known  coronary artery disease.  His last assessment of his LV function was by  cath in 2006, 60% with no areas of hypokinesia.   He has stable anatomy and stable symptoms.   His history is also significant for sleep apnea intolerant to CPAP,  nonobstructive carotid disease, history of cellulitis, and abscess of  the leg in the past, chronic lower extremity edema now worse, low back  pain which is chronic, gastroesophageal reflux disease, hyperlipidemia,  followed by Dr. Leslie Dales, hypertension, hiatal hernia, type 2 diabetes  insulin requiring.   ALLERGIES:  CELEBREX.   CURRENT MEDICATIONS:  1. Prandin 2 mg p.o. t.i.d.  2. Crestor 20 mg at bedtime.  3. Prilosec 20 mg  daily.  4. Antara 130 mg 1 tablet daily.  5. Metformin 500 mg twice a day.  6. Imdur 30 mg daily.  7. Actos 45 mg a day.  8. Plavix 75 mg per day.  9. Hydrochlorothiazide 25 mg 2 tablets once a day which I changed      recently to furosemide 40 mg a day.  10.Metoprolol 50 mg p.o. b.i.d.  11.Altace 10 mg daily.  12.Zetia 10 mg per day.  13.Lantus sliding scale.  14.Zoloft 150 mg tablet daily.  15.Januvia 100 mg 1 p.o. daily.  16.Nitroglycerin p.r.n.  17.Fish oil 1200 mg 2 tablets a day.   PAST SURGICAL HISTORY:  Coronary bypass grafting as mentioned above.   FAMILY HISTORY:  Remarkable for diabetes.   SOCIAL HISTORY:  He is married.  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  He is nicely recognized  with numerous awards.   He does not smoke.  He  does not drink.   REVIEW OF SYSTEMS:  Negative other than the HPI.  Please refer to the  above.   PHYSICAL EXAMINATION:  GENERAL:  He is in no acute distress.  VITAL SIGNS:  Blood pressure is 151/66 in the right and 139/61 in the  left arm, his pulse is 59 and regular, his weight is 281 down 1 pound,  and he is 5 feet 10.  HEENT:  Unremarkable.  Dentition satisfactory.  NECK:  Supple.  There is no obvious JVD but difficult to read.  Carotid  upstrokes equal bilaterally with soft systolic sounds.  Thyroid is not  enlarged.  Trachea is midline.  LUNGS:  Clear to auscultation and percussion without rhonchi or wheezes  or rales.  Sternotomy site is well-healed.  HEART:  His PMI is difficult to appreciate.  He has normal S1-S2.  No  obvious gallop.  ABDOMEN:  Protuberant, good bowel sounds.  There is no obvious  organomegaly.  No tenderness.  EXTREMITIES:  4+ pitting edema to the knees with several broken blisters  that are weeping.  He has some cellulitis in the right lower extremity  around a superficial wound site.  His pulses were diminished but  present.  There is no sign of DVT but it  is difficult to tell with his  massive edema.  NEUROLOGIC:  Intact.  SKIN:  Unremarkable.   ASSESSMENT:  1. Acute on chronic diastolic heart failure.  2. Progressive lower extremity edema, now weeping or class IV.  3. Coronary disease currently stable.  4. Cellulitis  5. Other problems as mentioned above.   PLAN:  1. Admit to telemetry.  2. Intravenous Lasix 80 mg IV t.i.d.  3. Bedrest with leg elevation.  4. IV Rocephin 1 gram today and q.a.m. to switch to Keflex eventually.  5. Wound Center consult today if possible.  6. Arterial Dopplers.  7. Venous Dopplers.  8. Subcu Lovenox 40 mg for DVT prophylaxis.      Thomas C. Wall, MD, [facility]  Electronically Signed     Jesse Sans. Daleen Squibb, MD, [facility]  Electronically Signed    TCW/MEDQ  D:  10/08/2008  T:  10/09/2008  Job:  [id number]

## 2010-07-18 NOTE — Assessment & Plan Note (Signed)
[facility]   NAME:  MALIKIAH, DEBARR NO.:  [id number]   MEDICAL RECORD NO.:  [id number]      DATE OF BIRTH:  [birth year]   PHYSICIAN:  Joanne Gavel, M.D.         VISIT DATE:  10/27/2008                                   OFFICE VISIT   HISTORY OF PRESENT ILLNESS:  A 70 year old male recently treated for  cellulitis and recently diuresed to 16 pounds of extraneous fluid comes  with some superficial ulcerations of his right lower extremity.   Physical examination reveals temperature 98, pulse 66, respirations 16,  blood pressure 120/70.  Glucose 215.  He has several small superficial  ulcerations, which were relatively clean.  The leg, however, is quite  red which he says is from tape that was covering his wounds.  There is  no tenderness and not much swelling.  I cannot feel a pulse in the  posterior tibial or dorsalis pedis.  His ABI is 0.75.  He says that he  has had recent vascular studies and before placing Unna boot, I would  like to tell that his inflow is good.   PLAN:  Retrieve vascular studies from [facility].  Dress wounds with  Santyl, Adaptic, and Profore light; and see [place] in 7 days.      Joanne Gavel, M.D.  Electronically Signed     RA/MEDQ  D:  10/27/2008  T:  10/28/2008  Job:  [id number]

## 2010-07-18 NOTE — Assessment & Plan Note (Signed)
[facility]                            CARDIOLOGY OFFICE NOTE   NAME:Yaeger, [name]                         MRN:          [id number]  DATE:10/01/2007                            DOB:          [birth year]    Mr. Corpus returns today for management of following issues:  1. Coronary artery disease, status post coronary artery bypass      grafting.  He is currently having no angina or dyspnea on exertion.      He has been very active on his farm.  His last stress Myoview was      on December 31, 2006.  EF of 58% with mild-to-moderate inferior and      apical ischemia, which is stable.  His last catheterization showed      distally occluded LAD at the apex.  His EF was 60% with no wall      motion abnormalities.  2. Carotid artery disease.  Carotid Dopplers were repeated in July      2009, which shows no significant change with a 40-59% bilateral      stenoses in the internal carotid arteries and great flow in both      vertebrals.  He is having no symptoms of TIAs.  3. Hypertension.  4. Hyperlipidemia.  5. Sleep apnea.  He will see Dr. Delford Field, but he still cannot tolerate      the machine.   Dr. Leslie Dales has been following his lipids and his blood pressure.   His meds are extensive and are unchanged since his last visit,  01/06/2007.   PHYSICAL EXAMINATION:  VITAL SIGNS:  His blood pressure is 126/63, his  pulse is 66 and regular, and his is weight is 271.  HEENT:  Normocephalic, atraumatic.  PERRLA.  Extraocular movements are  intact.  Sclerae are clear.  Face symmetry is normal.  NECK:  Carotids upstrokes were equal bilaterally without obvious bruits.  Thyroid is not enlarged.  Trachea is midline.  LUNGS:  Clear.  HEART:  Reveals a poorly appreciated PMI.  Normal S1 and S2.  ABDOMEN:  Soft.  Good bowel sounds.  EXTREMITIES:  Only trace edema.  Pulses are intact.  NEURO:  Intact.   I think Jonny Ruiz is doing remarkably well.  I have asked him to stay on  his  current medications.  He would like to take some Viagra p.r.n.  I have  asked him to stop his isosorbide.  If he has any angina or any symptoms,  he will go back on his isosorbide.  Otherwise, I will see him back in 6  months.     [name] Squibb, MD, [facility]  Electronically Signed   TCW/MedQ  DD: 10/01/2007  DT: 10/02/2007  Job #: [id number]   cc:   Veverly Fells. Altheimer, M.D.

## 2010-07-21 NOTE — Assessment & Plan Note (Signed)
[facility]                            CARDIOLOGY OFFICE NOTE   NAME:[name] [name], [name] [name]                         MRN:          [id number]  DATE:02/12/2006                            DOB:          [birth year]    [name] [name] comes in today for further management of his coronary artery  disease, carotid disease, and ischemia.   Last visit in June, we changed his Lipitor to Crestor because of some  problems with muscle aches and soreness.  This did not really improve  these symptoms; however, he is still taking Crestor.  His numbers were  at goal with Dr. Leslie Dales, except, of course, his low HDL of 31, his  triglycerides were 272, his hemoglobin A1c was 7.1.   His meds, otherwise, are unchanged.  He did have carotid Dopplers September 19, 2005, which showed stable, nonobstructive plaque.   He is having no symptoms of angina or ischemia.  In fact, he says he  feels very good.   His blood pressure today is 129/74.  His pulse is 63 and regular. He  weighs 273.  His physical exam is unchanged.   We spent about 20 minutes talking about his medications, risk factor  reduction, and followup.  We will arrange a followup with [place] in 6  months.  At that time, he will need a stress Myoview.  He also needs  carotid Doppler followup.  I made no changes in his program today.     [name] Squibb, MD, [facility]  Electronically Signed    TCW/MedQ  DD: 02/12/2006  DT: 02/12/2006  Job #: [id number]   cc:   Veverly Fells. [place], M.D.  Arta Silence, MD

## 2010-07-21 NOTE — Cardiovascular Report (Signed)
[place]. [facility]  Patient:    [name] [name], [name] [name] Visit Number: [id number] MRN: [id number]          Service Type: CAT Location: 3700 [id number] [id number] Attending Physician:  Veneda Melter Dictated by:   Veneda Melter, M.D. Proc. Date: 01/07/01 Admit Date:  01/07/2001 Discharge Date: 01/08/2001   CC:         Maisie Fus C. Daleen Squibb, M.D. [facility]  Laurita Quint, M.D. [facility]   Cardiac Catheterization  PROCEDURES PERFORMED: 1. Left heart catheterization. 2. Left ventriculogram. 3. Selective coronary angiography. 4. Selective angiography, saphenous vein internal mammary bypass grafts. 5. Percutaneous transluminal coronary angioplasty and stenting of the    saphenous vein graft to the posterior descending artery of the    right coronary artery.  DIAGNOSES: 1. Native three-vessel coronary artery disease. 2. Atherosclerosis saphenous vein graft. 3. Normal left ventricular systolic function.  INDICATIONS: The patient is a 70 year old gentleman with a history of coronary artery disease who underwent coronary artery bypass graft surgery in 1998. He had done well in the interim. However, recently he presents with increasing dyspnea on exertion and easy fatigability. The patient underwent stress imaging study showing stented ischemia in the inferior wall.  He has overall well preserved LV function. He presents now for further angiographic assessment.  TECHNIQUE: Informed consent was obtained, the patient was brought to the cardiac catheterization lab where a 6 French sheath was placed in the right femoral artery. Left heart catheterization, left ventriculogram and selective angiography were then performed in the usual fashion using preformed 6 French Judkins catheters.  FINDINGS: Initial findings are as follows: 1. Left main trunk: The left main trunk has mild irregularities. 2. LAD: This is a medium caliber vessel that provides a trivial    first diagonal branch in the proximal  segment and a small second    diagonal branch in the mid section. The LAD has moderate disease of    70% in the mid and distal segments. The distal and apical LAD are    seen to fill via a LIMA graft. There is moderate disease of 50-70%    distal to the anastomotic site. The first diagonal branch is a trivial    vessel. The second diagonal branch is occluded in its proximal segment    and fills the anastomosis with the LIMA graft. 3. Left circumflex artery: This is a medium caliber vessel that provides a    trivial first marginal branch in its proximal segment and two    medium caliber marginal branches in the mid section. The AV circumflex is    occluded after the trivial marginal branch. The second and third marginal    branches is seen to fill via saphenous vein graft and these exhibit mild    irregularities of 30%. 4. Right coronary artery: Dominant. This is a large caliber vessel that    provides a posterior descending artery and two posterior ventricular    branches in its terminal segment. There is stenting within the proximal    and distal segments that are patent with mild disease of 30%. There is    focal narrowing of 70% in the mid section. The distal vasculature has    moderate disease extending into the posterior ventricular branch. The    posterior descending artery is severe diseased if not occluded in its    proximal segment and fills via right to right collaterals. 5. LIMA to the second diagonal branch with continuation to the LAD is  patent. 6. Saphenous vein graft to the OM-2 with continuation of OM-3 is patent with    mild irregularities. 7. Saphenous vein graft to the posterior descending artery of the right    coronary artery is subtotally occluded of 99% in its proximal segment at    the site of a surgical clip. Distal filling is not noted.  LEFT VENTRICULOGRAM: Normal end-systolic and end-diastolic dimensions. Overall left ventricular function is well  preserved, ejection fraction of greater than 65%. No mitral regurgitation. LV pressure is 185/10, aortic is 185/90, LVEDP equals 24.  These findings were reviewed with the patient and we elected to proceed with an attempt at percutaneous intervention of the saphenous vein graft to the posterior descending artery. Failing this, we would then proceed with intervention to the native right coronary artery. The 6 French sheath was exchanged for a 7 [place] sheath. The patient was given heparin and ReoPro on a weight-adjusted basis to maintain ACT of greater than 250 seconds. Initially the 7 [place] guide catheter was introduced and could engage the vein graft. However, a wire could not be advanced into the vessel due to the acute angulation between the guide catheter and the takeoff of the vein graft.  We then introduced a 7 [place] multipurpose 1 guide catheter without side holes and this could be seated well at the origin of the vein graft.  A 0.014 inch Hi-Torque Floppy wire was advanced within a 2.5 x 10 mm OpenSail balloon was successful negotiated into the distal segment of the vein graft. Manual injection of contrast with the lumen of the balloon confirmed position within the true lumen of the vessel. The 2.5 x 10 mm OpenSail balloon was then used to dilate the proximal segment of the vein graft. Three inflations were performed at up to 8 atmospheres for 30 seconds. Repeat angiography now showed significant improvement in flow through the vein graft TIMI-3. There was residual disease of 70-80% in the proximal segment of the vein graft and it did appear that there was a surgical clip in position at the site placed to occlude a branch from the vein graft. We then elected to proceed with distal protection and the PercuSurge guide wire positioned in the distal segment of the saphenous vein graft. This was then inflated and seen to be occlusive in  the vessel and a 2.5 x 15 mm AVE S660  stent introduced. This was deployed in the proximal segment of the vein graft at 10 atmospheres for 30 seconds. A 2.5 x 9 mm Maverick balloon was introduced. Three inflations were performed within the distal, mid and proximal segments of the stent at 20 atmospheres for 30 seconds. A single inflation was also performed at the site of severe stenosis at 22 atmospheres for 30 seconds. The balloon was then retracted and aspiration performed in the usual fashion. The PercuSurge balloon was then deflated and repeat angiography performed. This now showed no residual stenosis in the proximal segment of the vein graft for dilatation of the stent and TIMI-3 flow to the vein graft and posterior descending artery. In fact there was retrograde filling of the distal RCA and posterior ventricular branch. There was no evidence of distal thromboembolic phenomena. The guide catheter was removed and the sheaths secured into position. The patient tolerated the procedure well and was transferred to the ward in stable condition.  FINAL RESULTS: Successful percutaneous transluminal coronary angioplasty and stenting of the proximal segment of the saphenous vein graft to the posterior descending  artery with reduction of subtotal 99% narrowing to 0% with placement of a 2.5 x 15 mm S660 stent using the PercuSurge distal GuardWire balloon. Dictated by:   Veneda Melter, M.D. Attending Physician:  Veneda Melter DD:  01/07/01 TD:  01/08/01 Job: [id number]

## 2010-07-21 NOTE — Cardiovascular Report (Signed)
NAME:  [name] [name], MESTA NO.:  [id number]   MEDICAL RECORD NO.:  [id number]                   PATIENT TYPE:  OIB   LOCATION:  5727                                 FACILITY:  [facility]   PHYSICIAN:  Salvadore Farber, M.D.             DATE OF BIRTH:  1940/08/31   DATE OF PROCEDURE:  09/22/2002  DATE OF DISCHARGE:  09/23/2002                              CARDIAC CATHETERIZATION   PROCEDURE:  Left heart catheterization, left ventriculography, coronary  angiography, saphenous vein graft angiography x1, left subclavian  angiography, left internal mammary artery angiography.   INDICATIONS:  [name] [name] is a 70 year old gentleman with coronary disease  status post coronary artery bypass grafting in the distant past.  He had  chest discomfort after being out in the heat for a prolonged period of time.  He was referred by Jesse Sans. Wall, M.D. for coronary angiography.   PROCEDURAL TECHNIQUE:  Informed consent was obtained.  Under 1% lidocaine  local anesthesia a 6-French sheath was placed in the right femoral artery  using the modified Seldinger technique.  Diagnostic angiography was  performed using JL4 and JR4 catheters for the native coronaries, JR4 for the  vein graft to ramus intermedius and OM 2, JR4 for the subclavian, and LIMA  catheter for the left internal mammary artery.  Ventriculography was  performed using a pigtail catheter.  The saphenous vein graft to the PDA  which was previously documented to be occluded was not engaged.   COMPLICATIONS:  None.   FINDINGS:  1. LV 143/8/25.  EF greater than 70% without regional wall motion     abnormality.  2. Left main:  Angiographically normal.  3. LAD:  Large vessel giving rise to two diagonal branches.  The mid vessel     is diffusely diseased with up to 80% stenosis with the most severe     disease being between the first and second diagonal branch.  The distal     LAD is severely diffusely diseased as is  the second diagonal branch.  The     sequential LIMA graft to second diagonal and LAD is patent.  However,     again, the runoff vessels are severely and diffusely diseased.  4. Ramus intermedius:  Large vessel which is occluded at its ostium.  The     sequential saphenous vein graft to ramus intermedius and OM 2 is widely     patent to the ramus.  5. Circumflex:  The circumflex is a moderate sized vessel giving rise to two     obtuse marginals.  The AV groove circumflex is occluded after the takeoff     of the first marginal.  The sequential saphenous vein graft to ramus     intermedius and OM 2 is patent.  There is a 20% stenosis just proximal to     the anastomosis with OM 2.  6.  RCA:  Large, dominant vessel.  There is a 30% stenosis in the proximal     vessel.  The previously placed stent in the mid RCA is widely patent.     The PDA is occluded proximally with bridging collaterals.  The saphenous     vein graft to PDA has previously been documented to be occluded and was     not visualized.   IMPRESSION/RECOMMENDATIONS:  Patent left internal mammary artery to left  anterior descending and second diagonal branch.  However, the target vessels  to both are severely and diffusely diseased.  There was no  improvement in these vessels after administration of nitroglycerin via the  left internal mammary artery graft.  His anatomy is otherwise unchanged  compared with prior catheterization.  Will add long-acting nitrates to his  medical regimen.                                               Salvadore Farber, M.D.    WED/MEDQ  D:  10/27/2002  T:  10/27/2002  Job:  [id number]   cc:   Laurita Quint, M.D.  [address] [place]  [place] [address]  Fax: [phone]   Jesse Sans. Wall, M.D.

## 2010-07-21 NOTE — Discharge Summary (Signed)
[place]. [facility]  Patient:    [name] [name], [name] [name] Visit Number: [id number] MRN: [id number]          Service Type: CAT Location: 6500 [id number] Attending Physician:  [name] [name] Dictated by:   [name] [name], P.A.-C. Admit Date:  07/02/2001 Disc. Date: 07/03/01   CC:         [name] [name], M.D. [name] [name]             [name] [name], M.D. [name] [name]             [name] [name], M.D. [name] [name], [name] [name], M.D.                  Referring Physician Discharge [facility]  DATE OF BIRTH:  Nov 10, 1940  SUMMARY OF HISTORY:  [name] [name] is a 70 year old white male, who saw [name] [name] on June 12, 2001, complaining of occasional chest tightness that was different to his prior interventions.  A positive Cardiolite was obtained, and this revealed reversible hypoperfusion in the distribution of the PDA suggestive of the possibility of restenosis.  It was felt that compared to studies on December 31, 2000, the studies were similar.  At that time, he was catheterized and has a successful PTCA stenting of the proximal segment of the vein graft to the PDA, reducing a lesion from 99% to 0% without complication. He has normal LV function.  His history is notable for prior bypass surgery in May 1998, as well as obesity, diabetes, hypertension, hyperlipidemia, and retinopathy.  He was set up for a same-day catheterization.  LABORATORY DATA:  Admission sodium was 139, potassium 4.0, BUN 18, creatinine 1.0, glucose 94.  H&H 13.7 and 39.1, normal indices, platelets 223.  WBC 5.2, PT 12.9.  H&H prior to discharge was 13.4 and 38.1, platelets 213, WBC 5.8. Sodium 141. potassium 3.8, BUN 13, creatinine 1.0, glucose 192.  EKG showed normal sinus rhythm, first-degree A-V block, nonspecific ST-T wave changes.  HOSPITAL COURSE:  [name] [name] underwent cardiac catheterization.  According to Dr. Donne [name] note, his ejection fraction was 55%.  He had native three-vessel coronary artery  disease, LIMA to the diagonal 1 and LAD was patent.  Saphenous vein graft to the OM-1 and OM-2 was patent.  Saphenous vein graft to the PDA was 100% totally occluded.  Dr. Chales [name] attempted to perform angioplasty to the saphenous vein graft to the PDA, but this was unsuccessful; thus he performed angioplasty stenting to the mid RCA, reducing an 80% lesion to less than 20% without difficulty.  Postsheath removal and bed rest, he was maintained on Integrilin.  Catheterization site was intact.  Prior to discharge, he was ambulating without difficulty.  DISCHARGE DIAGNOSES: 1. Unstable angina with positive Cardiolite, as previously described. 2. Status post failed angioplasty of the saphenous vein graft to the posterior    descending artery, status post stenting to the mid right coronary artery. 3. History as previously.  DISPOSITION:  He is discharged home.  DISCHARGE MEDICATIONS:  His medications remained unchanged, and these include:  1. Plavix 75 mg q.d.  2. Coated aspirin 325 mg q.d.  3. He was asked to resume his Glucophage 1000 mg b.i.d. on Jul 05, 2001.  4. Lopid 600 mg b.i.d.  5. Lipitor 40 mg q.h.s.  6. Actos 45 mg q.d.  7. HCTZ 25 mg q.d.  8. Altace 10 mg q.d.  9. Lopressor 50  mg b.i.d. 10. Prandin 1 mg prior to meals. 11. Welchol 625 mg 3 tabs b.i.d. 12. Sublingual nitroglycerin 0.4 mg p.r.n.  DISCHARGE INSTRUCTIONS: 1. He was advised no lifting, driving, sexual activity, or heavy exertion for    two days. 2. Maintain a low salt, low fat, low cholesterol ADA diet. 3. If he had any problems with his catheterization site, he was asked to call    immediately. 4. He will see [name] [name]. in the office on Jul 29, 2001, at 11 a.m. when    [name] [name] was in the office. Dictated by:   [name] [name], P.A.-C. Attending Physician:  [name] [name] DD:  07/03/01 TD:  07/03/01 Job: [id number]

## 2010-07-21 NOTE — Discharge Summary (Signed)
[place]. [facility]  Patient:    [name] [name], MOTEN Visit Number: [id number] MRN: [id number]          Service Type: CAT Location: 3700 3711 [id number] Attending Physician:  Veneda Melter Dictated by:   Joellyn Rued, P.A.-C. Admit Date:  01/07/2001 Disc. Date: 01/08/01   CC:         Laurita Quint, M.D. [facility]   Referring Physician Discharge [facility]  DATE OF BIRTH:  08-Oct-1940  SUMMARY OF HISTORY:  Mr. Banfill is a 70 year old white male who was recently seen by Dr. Daleen Squibb for annual follow-up.  However, the wife reported concern that the patient has been experiencing diminished exercise capacity and energy.  He would break out in profuse diaphoresis after riding horses or doing minimal exertion.  He has also developed exertional dyspnea but no PND or orthopnea.  He was referred for Persantine Cardiolite to rule out progressive coronary artery disease.  Carotid Dopplers were unremarkable; however, the Cardiolite was interpreted as high-risk with evidence of ST segment depression during infusion and severe inferior wall ischemia with subtle redistribution in the lateral anterior walls of the base.  LV function was 65%.  The results were reviewed by Dr. Daleen Squibb via the phone and it was felt that he should come in for cardiac catheterization.  His history is notable for successful bypass surgery in May 1988.  He had a LIMA to the LAD and diagonal, saphenous vein graft to the ramus and obtuse marginal, saphenous vein graft to the PLA, saphenous vein graft to the PDA.  He has also had right coronary artery stenting in 1996 and angioplasty of the ramus in 1998.  He also has a history of obesity, diabetes, hypertension, dislipidemia, remote plantar fasciitis.  LABORATORY DATA:  Prior to admission, showed an H&H 13.7 and 39.4, normal indices, platelets 254, wbcs 5.6.  Sodium 140, potassium 4.1, BUN 14, creatinine 0.9, glucose 119.  PT 12.6, PTT 31.  EKGs post  procedure showed sinus rhythm, first degree AV block.  H&H post procedure was 12.4 and 35.2, platelets 205, wbcs 5.4.  Sodium 135, potassium 3.2, BUN 13, creatinine 1.0, glucose 213.  CK total 74.  HOSPITAL COURSE:  Mr. Rivkin underwent cardiac catheterization on November 5 by Dr. Chales Abrahams.  This revealed native three-vessel coronary artery disease. According to Dr. Donne Hazel progress note, the LIMA to the diagonal and LAD was patent; however, there was a 70% mid distal lesion after the anastomosis in the LAD.  The saphenous vein graft to the ramus and OM was also patent.  The saphenous vein graft to the PDA had a 99% proximal lesion.  Angioplasty stenting was performed to the saphenous vein graft to the PDA, reducing the lesion from 99% to 0%.  Noted that he should be on Plavix indefinitely.  Post procedure, it is noted that his blood pressure rose to 170 systolic.  It was also noted that he had not received his morning medications, and these were begun.  Post sheath removal and bedrest he was ambulating without difficulty . Dr. Chales Abrahams saw him on the morning of November 6 and felt he could be discharged home.  Just prior to discharge he did receive potassium 40 mEq.  DISCHARGE DIAGNOSES: 1. Dyspnea on exertion associated with a positive adenosine Cardiolite. 2. Progressive coronary artery disease status post angioplasty stenting on the    saphenous vein graft to the posterior descending artery as previously    described. 3. Hypertension. 4.  Hypokalemia. 5. History as previously.  DISPOSITION:  He is discharged home.  MEDICATIONS: 1. He was given a new prescription for Plavix 75 mg q.d. and told to remain on    this indefinitely. 2. He was asked to resume Glucophage 1000 mg two times a day on November 7. 3. He also received a new prescription for K-Dur 20 mEq q.d. 4. He was asked to continue his remainder medications.  These include:    a. Coated aspirin 325 q.d.    b. Lopid 600 mg  b.i.d.    c. Lipitor 40 mg q.h.s.    d. Altace 10 mg q.d.    e. Actos 45 mg q.d.    f. HCTZ 25 mg q.d.    g. Lopressor 50 mg b.i.d.    h. WelChol 625 mg two tablets t.i.d.    i. Prandin as previously.    j. Vitamins as previously.    k. Sublingual nitroglycerin.  ACTIVITY:  He was advised no lifting, driving, sexual activity, or heavy exertion for two days.  DIET:  Maintain low salt/fat/cholesterol ADA diet.  WOUND CARE:  If he had any problems with his catheterization site he was asked to call us immediately.  FOLLOW-UP:  He will see Dr. Daleen Squibb in the office on Thursday, January 23, 2001 at 12:15.  Consideration should be given to rechecking a BMP to check on his kidney function and potassium. Dictated by:   Joellyn Rued, P.A.-C. Attending Physician:  Veneda Melter DD:  01/08/01 TD:  01/08/01 Job: [id number]

## 2010-07-21 NOTE — Discharge Summary (Signed)
NAME:  [name] [name], [name] [name] NO.:  [id number]   MEDICAL RECORD NO.:  [id number]                   PATIENT TYPE:  OIB   LOCATION:  4727                                 FACILITY:  [facility]   PHYSICIAN:  Guadelupe Sabin, M.D.             DATE OF BIRTH:  03-09-1940   DATE OF ADMISSION:  10/07/2001  DATE OF DISCHARGE:  10/08/2001                                 DISCHARGE SUMMARY   OUTPATIENT NOTE:  This was a planned outpatient surgical admission of this  70 year old white male insulin-dependent diabetic admitted for vitreoretinal  surgery of the left eye.   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 The patient  was seen and found to have an  initial acuity of 20/20 right eye and 20/25 left eye with correction.  Detailed fundus examination revealed a clear vitreous attached retinal with  background diabetic retinopathy with microaneurysms and proliferation  inferior temporarily in the right eye and a preretinal hemorrhage adjacent  to the optic nerve in the left eye. The patient  had had some panretinal  laser photocoagulation performed elsewhere in the left eye. Further  evaluation was performed including fluorescein angiography and fundus  photography. It was felt that the patient  warranted further laser  photocoagulation and this was performed on the right eye on December 09, 2001,  on June 25, 2001, left eye and Jul 23, 2001, left eye. 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 It  was felt that this would progressively effect his ultimate visual acuity and  it was recommended that the patient  undergo posterior vitrectomy surgery  with  membrane peeling and excision of the extensive fibrovascular tissue.  The risks were discussed in relation to the operation itself and the  accompanying anesthesia. The patient  elected to proceed with surgery  feeling that he was deteriorating all the time in the left eye. He signed an  informed consent and arrangements were made for his outpatient admission.   PAST MEDICAL HISTORY:  The patient is under the general care of Dr. Hetty Ely  at the [facility]. He continues under the care of Dr. Juanito Doom  his cardiologist for his arteriosclerotic heart disease, coronary artery  disease and history of coronary artery bypass surgery.   The patient  current takes multiple medications including his insulin,  Glucophage, gemfibrozil, CQ10 vitamin, Actos, hydrochlorothiazide, Plavix,  metoprolol, Lipitor, Altace, Welchol, Prandin and aspirin.   In consultation with Dr. Daleen Squibb it was felt necessary to discontinue his  Plavix and aspirin a few days prior to admission but would restart them as  soon as possible in the  postoperative period.   REVIEW OF SYSTEMS:  No current cardiorespiratory complain. The patient  denies any specific chest pain, shortness of breath, ankle swelling, cough  or dyspnea.   PHYSICAL EXAMINATION:  VITAL SIGNS: As recorded on admission. Blood pressure  142/89. Pulse 65. Respirations 16. Temperature 97.7.  GENERAL:  The patient is a pleasant well-nourished, well-developed white  male in no acute distress.  HEENT:  Eyes - ocular exam as noted above.  CARDIOLOGY: Chest and lungs clear to percussion and auscultation. Central  coronary artery bypass scar. Heart normal sinus rhythm. No cardiomegaly. No  murmurs.  ABDOMEN: Negative.  EXTREMITIES: Negative.   ADMISSION DIAGNOSES:  1. 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 2.  Status post  previous panretinal laser     photocoagulation to both eyes for  diabetic retinopathy.  2. Insulin-dependent diabetes mellitus.   SECONDARY DIAGNOSIS:  Arteriosclerotic heart disease, coronary artery  disease with coronary artery bypass surgery, hypertension,  hypercholesterolemia.   SURGICAL PLAN:  Posterior vitrectomy through pars plana using vitreous  infusion suction cutter under general anesthesia. The patient  is quite  anxious and restless and feels that he cannot tolerate the procedure under  local anesthesia.                                                Guadelupe Sabin, M.D.    HNJ/MEDQ  D:  10/07/2001  T:  10/11/2001  Job:  [id number]

## 2010-07-21 NOTE — Discharge Summary (Signed)
NAME:  AREL, TIPPEN NO.:  [id number]   MEDICAL RECORD NO.:  [id number]                   PATIENT TYPE:  OIB   LOCATION:  5727                                 FACILITY:  [facility]   PHYSICIAN:  Joellyn Rued, P.A. [facility]              DATE OF BIRTH:  1940-04-24   DATE OF ADMISSION:  09/22/2002  DATE OF DISCHARGE:  09/23/2002                           DISCHARGE SUMMARY - REFERRING   DISCHARGING PHYSICIAN:  Dr. Samule Ohm.   SUMMARY OF HISTORY:  Mr. Meras is a pleasant 70 year old white male who  presented to the office on September 17, 2002 complaining of chest discomfort and  shortness of breath after being out in the heat all last week.  He stated  that he was out in the heat for way too long and describes at least 90  degree temperatures.  When he would go in, he would be very short of breath  and have chest tightness.  He would not take any nitroglycerin, and he felt  that the discomfort would last most of the evening.  One day, he went out to  feed the dogs in extreme heat, and he had recurring symptoms.  Since that  time, he has not felt well, and he has consistently gotten better.  He also  notes occasional dizziness with standing; however, he denies any nausea,  diaphoresis, pedal edema, orthopnea, syncope or presyncope.   PAST MEDICAL HISTORY:  1. Notable for non-insulin-dependent diabetes.  2. Dyslipidemia.  3. Hypertension.  4. GERD.  5. Chronic low back pain.  6. Obesity.  7. Known coronary artery disease with status post bypass surgery, with LIMA     to the second diagonal and LAD, saphenous vein graft to the OM2 and OM3,     saphenous vein graft to the PDA.  His last catheterization in November     2002 showed patent grafts.  The saphenous vein graft to the PDA was     subtotaled at 99%.  He underwent angioplasty with stenting of the     saphenous vein graft to the PDA in November 2002.  EF at that time was     65%.  Most recent stress Cardiolite  was in June 2003, which showed small     infra-apical wall ischemia, __________ reversibility on the inferior wall     when compared to the previous study and the EF of 65%.   LABORATORY DATA:  Preadmission H&H was 13.5 and 38.5.  Indices were slightly  elevated at 95.1 and 33.3.  platelets were 216, WBC 5.0.  PTT 28, PT 12.1.  Sodium 142, potassium 3.9, BUN 16, creatinine 1.1.  EKGs, from the office,  show sinus bradycardia, wavy baseline, nonspecific ST/T wave changes.  It  was unchanged from prior EKGs.   HOSPITAL COURSE:  Mr. Garske was brought in for an outpatient cardiac  catheterization on September 22, 2002.  This was performed by Dr. Samule Ohm.  According to Dr. Melinda Crutch notes, he has noted three-vessel coronary artery  disease, EF of 70% without wall motion abnormalities.  A LIMA to the  diagonal 2 and LAD was patent.  The saphenous vein graft to the OM2 and OM3  was patent.  The saphenous vein graft to the PDA was not visualized.  It was  noted that he had a proximal 30% RCA lesion.  The stent in the mid RCA was  patent, and the PDA was totaled but had bridging collaterals present.  Dr.  Samule Ohm noted there was severe diffuse disease distal to the anastomosis of  the LAD and diagonal and recommended medical treatment.  There was not an  improvement with nitroglycerin.  Dr. Samule Ohm felt that Imdur should be added  to continuing to treat this coronary artery disease.   ___________ bed rest, he was ambulating about without difficulty.   On September 23, 2002, after review by Dr. Samule Ohm, it was felt that he could be  discharged home.  Dr. Samule Ohm felt that he should have an outpatient sleep  study.   DIAGNOSES:  1. Unstable angina.  2. Progressive coronary artery disease.  Continue medical treatment as     previously described.  History as previously described.   DISPOSITION:  He received a written prescription for Imdur 30 mg q.d. and  nitroglycerin, 0.4, 1 sublingual p.r.n.  He was asked  to resume his  Glucophage 1000 mg b.i.d. on Friday and to continue his home medications.   HOME MEDICATIONS:  1. Prandin 1 mg 2 tablets q.h.s.  2. Welchol 625 mg 6 tablets q.d.  3. Coated aspirin 81 mg q.d.  4. Actos 45 q.d.  5. Lopid 600 mg b.i.d.  6. Lipitor 40 mg q.h.s.  7. Zantac 75 mg b.i.d.  8. Metoprolol 50 mg b.i.d.  9. Plavix 75 mg q.d.  10.      HCTZ 25 q.d.  11.      Altace 10 q.d.  12.      Insulin 35 units q.h.s.  13.      Nitroglycerin 3-4 as needed.   SPECIAL INSTRUCTIONS:  1. He was advised no lifting, driving, sexual activity, horse-back riding or     heavy exertion for 2 days.  2. If he has any problems with his catheterization site, he was asked to     call.  3. He will have a sleep study on Saturday, October 10, 2002, at 8:30 p.m.  The     [facility] will mail him an information packet.    DIET:  Maintain low-salt-fat-cholesterol ADA diet.   FOLLOW UP:  He will follow up with Dr. Daleen Squibb on October 16, 2002 at 9:45 a.m.                                                 Joellyn Rued, P.A. [facility]    EW/MEDQ  D:  09/23/2002  T:  09/23/2002  Job:  [id number]   cc:   Shower, M.D.   Thomas C. Wall, M.D.   [facility]    cc:   Shower, M.D.   Thomas C. Wall, M.D.   [facility]

## 2010-07-21 NOTE — Cardiovascular Report (Signed)
[name] [name]. [facility]  Patient:    [name] [name], [name] [name] Visit Number: [id number] MRN: [id number]          Service Type: CAT Location: [id number] Attending Physician:  Veneda Melter Dictated by:   Veneda Melter, M.D. [facility] Proc. Date: 07/02/01 Admit Date:  07/02/2001 Discharge Date: 07/03/2001   CC:         [name] [name], M.D. [facility]  [name] [name], M.D.   Cardiac Catheterization  PROCEDURES PERFORMED: 1. Left heart catheterization. 2. Left ventriculogram. 3. Selective coronary angiography. 4. Selective angiography of the internal mammary and saphenous vein    bypass grafts. 5. PTCA with saphenous vein graft to the posterior descending artery. 6. PTCA and stent placement to the mid right coronary artery.  DIAGNOSES: 1. Major three-vessel coronary artery disease. 2. Atherosclerosed saphenous vein bypass grafts. 3. Normal left ventricular systolic function. 4. Unstable angina.  HISTORY:   [name] [name] is a 70 year old gentleman with a known history of coronary artery disease, who has previously undergone percutaneous intervention as well as coronary artery bypass surgery.  The patient most recently underwent a percutaneous intervention with stent placement of the proximal segment of the vein graft to the posterior descending artery in November 2002.  He had a severe narrowing at that time, and underwent stent placement.  He had done well post procedure; however, recently he presented with crescendo angina and chest tightness occurring with minimal activity.  he underwent stress imaging studies showing ischemia in the inferior wall, and he presents for further assessment.  TECHNIQUE:  Informed consent was obtained.  The patient was brought to the catheterization lab and a 6-French sheath placed in the right femoral artery, using modified Seldinger technique.  Then 6-French JL4 and JR4 catheters were used to engage the left and right coronary arteries for  selective angiography done in various projections, using manual injection of contrast.  The JR4 catheter was then used to engage the saphenous vein bypass grafts and internal mammary diagnostic catheter used to engage the LIMA graft to the LAD. Subsequently, a 6-French pigtail catheter was advanced into the left ventricle, and left ventriculogram performed using power injections of contrast.  FINDINGS: 1. LEFT MAIN TRUNK:  Left main trunk is medium caliber vessel, with diffuse    disease of 20%. 2. LEFT ANTERIOR DESCENDING ARTERY:  This begins as a medium-caliber vessel    and tapers significantly as it approaches the apex.  It provides a first    diagonal branch in the proximal segment.  The LAD has severe narrowing    of 70% in the mid section, prior to the anastomosis with the LIMA    graft.  The distal LAD seemed to fill via the LIMA graft, and has    diffuse disease of 70%.  The first diagonal branch is occluded proximally    and fills via LIMA graft.  It has moderate disease of 50% after the    anastomotic site. 3. LEFT CIRCUMFLEX ARTERY:  This is a small-caliber vessel.  The AV    circumflex is 100% occluded proximally and through its mid section.  The    first and second marginal branches are seen to fill via a sequential    saphenous vein graft, and exhibits mild disease. 4. RIGHT CORONARY ARTERY:  This is a dominant, large-caliber vessel that    provides a posterior descending artery and several posterior ventricular    branches in its terminal segment.  The right coronary artery  has    evidence of previously placed stents in the proximal and distal segments,    that are patent with approximately 30% in-stent restenosis.  There is    severe disease in the mid section of the right coronary artery, with    heavy calcification and a high-grade focal narrowing of 80%.  The    posterior descending artery has evidence of an anastomosis in the    mid section.  There is moderate  disease of 50-70% prior to the    anastomosis.  The distal vessel has mild irregularities.  A distal    posterior ventricular branch receives the sequential segment from the    saphenous vein graft and has mild disease as well. 5. GRAFTS:    a. LIMA to the first diagonal branch with sequential segment to the       LAD is patent.    b. Saphenous vein graft to OM1 with sequential segment to OM2 is patent.    c. Saphenous vein graft to the posterior descending artery with       sequential segment to posterior ventriclar branch is 100% occluded       proximally.  LEFT VENTRICULOGRAPHY:  Normal end-systolic and end-diastolic dimensions. overall left ventricular function appears well preserved.  Ejection fraction of greater than 55%.  No mitral regurgitation.  HEMODYNAMICS: 1. Left ventricular pressure:  130/10. 2. Aortic pressure:  130/67. 3. LVEDP 25.  ASSESSMENT AND PLAN:   These findings were reviewed with the patient and a review of prior films, we felt that an attempt at salvage of the vein graft may be beneficial in filling this intervention to the native right coronary artery.  INTERVENTIONAL PROCEDURE:  A 7-French sheath was exchanged for the 6-French sheath in the right femoral artery.   The patient was given 300 mg Plavix orally, 5000 units of Heparin and Integrilin on a weight-adjusted basis to maintain ACT of approximately 300 sec.  A 7-French JR4 guiding catheter was introduced; however, appropriate seating could not be obtained and a multipurpose catheter was then used to engage the vein graft to the right coronary artery.  Initially and extra-support wire was used, but again no support was provided to probe the occlusion.  Then a 2.5 x 10 mm OpenSail balloon was introduced, with a PT Grafix wire.  This was used to probe the occlusion, and was successfully advanced into the distal segment of the vessel.  The OpenSail balloon; however, would not be advanced through  the  occlusion.  Two inflations were performed at the ostium of the vein graft in the proximal segment of the stent; at 6 and 10 atm for 30 sec in an attempt to snowplow the lesion.  However, the wound could somehow be advanced.  A 1.5 x 10 mm OpenSail balloon was then introduced, and this could be advanced through the vessel.  Manual injections of contrast through the distal balloon port confirmed position and true lumen of the vessel; although, the distal vein graft was extremely small caliber.  A total of four inflations were performed, up to 10 atm for 30 sec within the proximal segment of the saphenous vein graft and within the area of stenting.  Repeat angiography showed no recurrence of flow.  The 2.5 x 10 mm OpenSail balloon was then introduced, and a total of nine inflations performed within the proximal segment of the saphenous vein graft, at up 16 atm for 40 sec. Manual injections of contrast were also performed through the  balloon lumen, to assess the anatomy of the vein graft.  It was felt that in the distal segment of the stent, and the native vein graft just distal to the stent, there was severe disease with evidence of a filling defect -- suggestive of vessel damage and atheroma.  Despite multiple balloon inflations, flow could not be re-established due to the extremely small size of the vessel (of approximately 2.0 to 2.3 mm).  It was felt that percutaneous stent placement would be unlikely to provide benefit or a longterm stability in the lesion. Therefore further attempts were thus abandoned.  We then turned our attention to the native right coronary artery, and then a JR4 guiding catheter used to engage the right coronary artery.  Using the extra-support wire, a 3.5 x 18 mm Vega stent was introduced, in an attempt to position this across the lesion.  However, it could not be advanced due to tortuosity and excessive calcification of the vessel.  A 3.0 x 13 mm  PowerSail balloon was introduced.  A total of five inflations were performed, at up to 22 atm for 30 sec in the mid right coronary artery. The 3.5 x 18 mm Mercedes stent was then introduced and carefully positioned and deployed in the mid RCA at 18 atm for 30 sec.  There was noderate residual waste in the proximal segment of 30-40%, and a 4.0 x 13 mm PowerSail balloon was introduced and used to post-dilate the proximal and mid sections of stent; two inflations were performed at 16 and 18 atm for 30 sec.  REPEAT ANGIOGRAPHY:  Showed significant improvement in vessel lumen, with perhaps 10-20% residual narrowing in the proximal segment of the stent, and there appeared to be full appositions sent with vessel wall, with TIMI3 flow continuing through through the rca.  There is no evidence of distal vessel damage.  This was deemed acceptable result.  The wire and guiding catheter were then removed, and the sheaths secured in position.  The patient tolerated the procedure well and was transferred to the Floor in stable condition.  FINAL RESULTS: 1. unsuccessful attempt at recanalization; with occluded saphenous vein graft    to the right coronary artery. 2. Successful PTCA and stent placement of the mid right coronary artery; with    a reduction of 80% narrowing to less than 20%.  Will place #3.5 x 18 mm Zeta stent dilates to 4 mm.Dictated by:   Veneda Melter, M.D. [facility] Attending Physician:  Veneda Melter DD:  07/02/01 TD:  07/04/01 Job: [id number]

## 2010-07-21 NOTE — Discharge Summary (Signed)
NAME:  [name] [name], [name] [name] NO.:  [id number]   MEDICAL RECORD NO.:  [id number]                   PATIENT TYPE:  OIB   LOCATION:  4727                                 FACILITY:  [facility]   PHYSICIAN:  Guadelupe Sabin, M.D.             DATE OF BIRTH:  1940-12-03   DATE OF ADMISSION:  10/07/2001  DATE OF DISCHARGE:  10/08/2001                                 DISCHARGE SUMMARY   DISCHARGE SUMMARY:  This was a somewhat urgent outpatient admission of this  70 year old white male, insulin-dependent diabetic admitted with chronic and  recurrent vitreous hemorrhage and increasing fibrovascular proliferation of  his left eye. The patient  was appraised of his complications of the surgery  itself and of the anesthesia in view of his previous arteriosclerotic heart  disease/coronary artery disease. He signed and informed consent and  arrangement was made for his outpatient admission. Dr. Juanito Doom his  cardiologist was alerted to the admission and arrangements were made for his  postoperative care on cardiac telemetry ward.   HOSPITAL COURSE:  The patient  was taken to the operating room where under  general anesthesia a pars plana vitrectomy was performed using extensive  peeling, cutting and excision of the fibrovascular tissue which was pulling  up on the retina adjacent to the macula. Vitreous hemorrhage was aspirated  and the vitreous cleared. 502 endolaser photocoagulation applications were  applied in a panretinal scatter technique in areas which had not been  previously lasered in the office. The patient  tolerated the hours to hour  and a half procedure under general anesthesia well without complication and  his vital signs remained stable throughout the procedure. An additional  retrobulbar block was used with the general anesthesia to prevent any  operative and postoperative pain. The attending anesthetist felt that this  helped in the general management of  the patient's general anesthesia during  the procedure. The patient  was taken to the operating recovery room and  then subsequently to the cardiac telemetry unit.   The patient  was seen on the evening of surgery and felt to be doing well.  His blood sugars were recorded at 180 and the patient  was monitored with a  sliding scale throughout the night. The patient  was to be seen again on 08-  06-03 and felt to be ready for discharge home. The patient  and his wife  were given a printed list of discharge instructions on the care and use of  the operated eye.   DISCHARGE MEDICATIONS:  Discharge ocular medications included TobraDex and  Cyclomydril Ophthalmic solution one drop four times a day five minutes a  part and atropine and Maxitrol ointment at bedtime .The patient  was to  resume his aspirin and Plavix when returning home.   Follow up  appointment in my office three to five days.   CONDITION ON  DISCHARGE:  Improved.   DISCHARGE DIAGNOSES:  1. 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  2. Insulin-dependent diabetes mellitus.    SECONDARY DIAGNOSIS:  1. Arteriosclerotic heart disease.  2. Coronary artery disease status post  coronary artery bypass surgery.  3. Hypertension.                                               Guadelupe Sabin, M.D.    HNJ/MEDQ  D:  10/07/2001  T:  10/11/2001  Job:  [id number]   cc:   Alden Server L. Peter Congo, M.D.   Thomas C. Wall, M.D. [facility]

## 2010-07-21 NOTE — Cardiovascular Report (Signed)
[name], [name] NO.:  [id number]   MEDICAL RECORD NO.:  [id number]          PATIENT TYPE:  OIB   LOCATION:  2899                         FACILITY:  [facility]   PHYSICIAN:  Charlies Constable, M.D. [facility] DATE OF BIRTH:  1941-01-11   DATE OF PROCEDURE:  10/10/2004  DATE OF DISCHARGE:                              CARDIAC CATHETERIZATION   CLINICAL HISTORY:  [name] is 70 years old and has had previous bypass  surgery in 1998 and has a known total occlusion of posterior descending  branch of the coronary and known distal disease in LAD.  He had a Cardiolite  scan which was abnormal and may have had some slight change from previous  scans and Dr. Daleen Squibb made the decision to bring him in for evaluation with  angiography.  He has had no chest pain.   PROCEDURE:  The procedure was performed via the right femoral artery with an  arterial sheath and #6 [place] performed coronary catheters.  A front wall  arterial puncture was performed and nonopaque contrast was used.  A LIMA  catheter was used for injection of the LIMA graft.  A JR4 catheter was used  for injection of the vein graft.  The right femoral artery was closed with  Angio-Seal at the end of the procedure.  The patient tolerated the procedure  well and left the laboratory in satisfactory condition.   RESULTS:  The aortic pressure was 181/87 with mean of 131 and left  ventricular pressure was 181/28.   Left main coronary artery:  The left main was free of significant disease.   Left anterior descending artery:  The left anterior descending artery was  complete through its mid portion after two sets _________ a diagonal branch.   Circumflex artery:  The circumflex artery was completely occluded after a  small marginal branch.   Right coronary artery:  The right coronary artery was a large vessel giving  rise to a clonus branch, a right ventricular branch, a posterior descending  branch and three posterolateral  branches.  There was heavy calcification  throughout the right coronary artery and there was 30% narrowing in the  proximal mid vessel.  The posterior descending branch was completely  occluded.  However, this failed by the distal limb of the old vein graft  which filled the posterior descending branch via a third posterolateral  branch.   The saphenous vein graft to the distal right coronary artery was completely  occluded at its origin (this was old).  The saphenous vein graft to the  marginal and posterolateral branch of the circumflex artery was patent.  There was 30% narrowing in its distal limb.   The LIMA graft to the diagonal branch of the LAD and LAD was patent, but the  LAD was a very small caliber vessel with diffuse disease and was totally  occluded out near the apex.  This was also present on the prior study.   Left ventriculogram:  The left ventriculogram performed in the RAO  projection showed good wall motion with no areas of hypokinesis.  The  estimated ejection fraction was 60%.   CONCLUSION:  1.  Coronary artery disease status post coronary artery bypass graft surgery      in 1998.  2.  Severe native vessel disease with total occlusion of the left anterior      descending and circumflex arteries, 30% narrowing in the proximal to mid      right coronary artery and total occlusion of the posterior descending      branch of the right coronary artery.  3.  Patent sequential vein graft to the marginal and posterolateral branches      of the circumflex artery with 30% narrowing in the distal limb, patent      sequential LIMA graft to the diagonal branch of the LAD and the LAD, but      with diffuse disease in the distal LAD and total occlusion of the LAD at      its distal portion, and total occlusion of the saphenous vein graft to      the right coronary artery (old).  4.  Normal LV function.   RECOMMENDATIONS:  The patient's coronary anatomy has not changed from  prior  studies.  He does have potential ischemia in the distal LAD.  The posterior  descending branch has actually filled fairly well through the posterolateral  branch and the distal limb of the old vein graft.  We will plan reassurance  and continued medical therapy.       BB/MEDQ  D:  10/10/2004  T:  10/11/2004  Job:  [id number]   cc:   Thomas C. Wall, M.D.   Laurita Quint, M.D.  [address] [place]  [place] [address]  Fax: [phone]

## 2010-07-21 NOTE — Op Note (Signed)
NAME:  [name] [name], [name] [name] NO.:  [id number]   MEDICAL RECORD NO.:  [id number]                   PATIENT TYPE:  OIB   LOCATION:  4727                                 FACILITY:  [facility]   PHYSICIAN:  Guadelupe Sabin, M.D.             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   OPERATION:  Posterior vitrectomy through pars plana using vitreous infusion  suction cutter with associated membrane peeling and excision and panretinal  endophotocoagulation.   SURGEON:  Guadelupe Sabin, M.D.   ASSISTANT:  Nurse.   ANESTHESIA:  General.   During the intubation the patient  had an unusual configuration and it was  extremely difficult to pass an endotracheal tube. It was then elected to  occlude the posterior pharynx and use this route as the site for anesthesia  administration. The patient  was satisfactorily anesthetized.   OPERATIVE PROCEDURE:  After the patient  was prepped and draped a lid  speculum was inserted in the left eye. The eye was turned downward and a  superior rectus traction suture placed. A peritomy was performed adjacent to  the limbus from the 9:30 to the 4 o'clock position. Three sclerotomy sites  were prepared 3.5 mm from the limbus using the 20 gauge MVR blade. The 4 mm  vitreous infusion terminal was secured in place at the 4 o'clock position  with a 5-0 white mattress background suture. The tip could be seen  projecting into the vitreous cavity. The fiberoptic light pipe was inserted  at the 2 o'clock position and the handpiece vitreous infusion suction cutter  at the 10 o'clock  position. Flow vitreous infusion suction cutting were  begun from an anterior to a posterior direction toward the retina. The  vitreous was relatively clear centrally but had a chronic vitreous  hemorrhage inferiorly and there was marked attachment of the vitreous to the  retina itself extending backward to the optic nerve. As the retina was  approached the advanced proliferation site along the superior temporal blood  vessel with tractional retinal detachment was noted and the anterior-  posterior attachment severed. Attention was then paid to the membrane itself  and the vitreous scissors were introduced and the membrane segmented into  several sections. The sections were then cut from the surface of the retina  with the vitreous scissors. The remnants were then aspirated with the  vitreous infusion suction cutter alternating with the scissors dissection  and peeling. An adjacent area of surface needle vascularization was noted  infranasally. The proliferative site of tractional retinal detachment was  completely  excised. The endolaser panretinal total coagulator was then  assembled and 502 additional laser applications were applied around the area  of the  tractional retinal detachment which had settled and along the area  of neovascularization. Adequate burns were achieved. Indirect ophthalmoscopy  with scleral depression was then carried out to ensure no proliferal retinal  tear or detachment areas. It was then elected to close. The vitreous  instruments were withdrawn and the sclerotomy sites closed with 7-0 Vicryl  interrupted sutures. The conjunctiva was pulled forward and closed with a  running 7-0 Vicryl suture. Depo-Garamycin and dexamethasone were injected in  the sub-Tenon space inferiorly. Maxitrol and atropine ointment were  instilled in the conjunctival cul-de-sac and a light patch and protector  shield applied.   Duration of procedure and anesthesia administration 1 to  1-1/2 hours. The  patient tolerated the procedure well in general. Left the operating room for  the recovery room in good condition.                                                Guadelupe Sabin, M.D.    HNJ/MEDQ  D:  10/07/2001  T:  10/11/2001  Job:  [id number]

## 2010-07-21 NOTE — Assessment & Plan Note (Signed)
[facility]                         GASTROENTEROLOGY OFFICE NOTE   NAME:Cove, [name]                         MRN:          [id number]  DATE:02/12/2006                            DOB:          26-Jul-1940    REFERRING PHYSICIAN:  Arta Silence, MD   REASON FOR CONSULTATION:  Screening colonoscopy.   HISTORY OF PRESENT ILLNESS:  This is a pleasant 70 year old white male,  morbid obesity, hypertension, diabetes mellitus, dyslipidemia and  coronary artery disease, for which he has undergone both coronary artery  bypass grafting and coronary artery stent placement.  He is referred  through the courtesy of Dr. Hetty Ely, at the patient's request,  regarding screening colonoscopy.  He also has a number of GI complaints  that he wishes to discuss.  First, he has had problems with indigestion  and heartburn for many years.  For this he take ranitidine 150 mg b.i.d.  On the medication he has no heartburn or indigestion.  No dysphagia.  He  denies prior screening upper endoscopy.  He did have problems with  nausea in the morning which lasts 10 to 30 minutes and resolves after  breakfast.  He thinks this may be a medication side effect.  Next, he  reports problems with loose stools and urgency, which occurs shortly  after episodes of hypoglycemia.  No difficulties with constipation,  change in stool caliber, melena or hematochezia.  He has had steady  weight gain over the years.   PAST MEDICAL HISTORY:  As above.   PAST SURGICAL HISTORY:  As above.   ALLERGIES:  INTOLERANT TO CELEBREX, though no true drug allergies.   CURRENT MEDICATIONS:  1. Baby aspirin 81 mg daily.  2. Januvia 100 mg daily.  3. Co-Q10 300 mg daily.  4. Toprol 50 mg b.i.d.  5. Metformin 1000 mg b.i.d.  6. Ranitidine 150 mg b.i.d.  7. Crestor 20 mg at night.  8. Isorbid 30 mg at night.  9. Antara 130 mg at night.  10.Altace 10 mg at night.  11.Plavix 75 mg daily.  12.Actos 45  mg at night.  13.Zetia 10 mg at night.  14.Hydrochlorothiazide 50 mg q.a.m.  15.Zoloft 50 mg daily.  16.Lantus insulin 40 to 50 units subcu q.a.m. and q.p.m.  17.Prandin 2 mg t.i.d.  18.He also has Insta-Glucose available for hypoglycemia.   FAMILY HISTORY:  No family history of gastrointestinal malignancy.  Father with diabetes.   SOCIAL HISTORY:  The patient is married with 2 sons.  He is a  Insurance account manager.  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  He does not smoke or use alcohol.   REVIEW OF SYSTEMS:  Per diagnostic evaluation form.   PHYSICAL EXAMINATION:  Obese white male in no acute distress.  He is  alert and oriented.  VITAL SIGNS:  Blood pressure is 128/74, heart rate is 64 and regular,  weight is 274 pounds.  He is 5 feet 10-1/2 inches in height.  HEENT:  Sclerae are anicteric, conjunctivae are pink, oral mucosa is  intact.  NECK:  Thick.  LUNGS:  Clear.  HEART:  Regular.  ABDOMEN:  Obese and soft without tenderness, mass or hernia.  EXTREMITIES:  Are without edema.   IMPRESSION:  1. Chronic gastroesophageal reflux disease without alarm symptoms.      Symptoms controlled on H2 receptor antagonists.  2. Vague transient early morning nausea, likely related to      medications.  3. Occasional loose stools.  Nonspecific.  Doubt significant.  4. Colon cancer screening.  Baseline risk without contraindication.      Higher risk patient, however, due to body habitus, diabetes and      chronic Plavix therapy.   RECOMMENDATIONS:  1. Colonoscopy to provide colon cancer screening, and upper endoscopy      to screen for Barrett's esophagus.  The nature of both procedures      as well as the risks, benefits and alternatives have been reviewed.      He understood and agreed to proceed.  2. Adjust diabetes mellitus medicines prior to the procedures.  We      will recommend one-half his p.m. insulin the day before the      procedure,  and to hold his insulin the morning of the procedure.      In addition hold his oral diabetic agents the day of his procedure.  3. Consult the patient's cardiologist, Dr. Daleen Squibb, regarding the      feasibility of holding Plavix 1 week prior to his procedures.  If      this is acceptable, we would plan to keep him on his baby aspirin.      If this not acceptable, then we will perform the procedures with      the patient      understanding that significant pathology, if encountered, might not      be dealt with therapeutically if on Plavix.     Wilhemina Bonito. Marina Goodell, MD  Electronically Signed    JNP/MedQ  DD: 02/12/2006  DT: 02/13/2006  Job #: [id number]   cc:   Arta Silence, MD  Veverly Fells. Al[place], M.D.  Thomas C. Wall, MD, [facility]

## 2010-09-05 ENCOUNTER — Other Ambulatory Visit: Payer: Self-pay | Admitting: *Deleted

## 2010-09-05 MED ORDER — METOPROLOL TARTRATE 50 MG PO TABS: 50.0000 mg | ORAL_TABLET | Freq: Two times a day (BID) | ORAL | Status: DC

## 2010-09-13 ENCOUNTER — Other Ambulatory Visit: Payer: Self-pay | Admitting: *Deleted

## 2010-09-13 MED ORDER — ROSUVASTATIN CALCIUM 20 MG PO TABS: 20.0000 mg | ORAL_TABLET | Freq: Every day | ORAL | Status: DC

## 2010-12-14 DIAGNOSIS — H269 Unspecified cataract: Secondary | ICD-10-CM | POA: Insufficient documentation

## 2010-12-14 DIAGNOSIS — E119 Type 2 diabetes mellitus without complications: Secondary | ICD-10-CM | POA: Insufficient documentation

## 2010-12-14 DIAGNOSIS — H4310 Vitreous hemorrhage, unspecified eye: Secondary | ICD-10-CM | POA: Insufficient documentation

## 2010-12-25 ENCOUNTER — Other Ambulatory Visit (INDEPENDENT_AMBULATORY_CARE_PROVIDER_SITE_OTHER): Payer: Self-pay | Admitting: General Surgery

## 2010-12-25 ENCOUNTER — Ambulatory Visit: Payer: Medicare Other

## 2010-12-25 ENCOUNTER — Ambulatory Visit (INDEPENDENT_AMBULATORY_CARE_PROVIDER_SITE_OTHER): Payer: Medicare Other | Admitting: Family Medicine

## 2010-12-25 ENCOUNTER — Observation Stay (HOSPITAL_COMMUNITY)
Admission: EM | Admit: 2010-12-25 | Discharge: 2010-12-28 | Disposition: A | Discharge status: Home / Self Care | Payer: Medicare Other | Attending: General Surgery | Admitting: General Surgery

## 2010-12-25 ENCOUNTER — Encounter: Payer: Self-pay | Admitting: Family Medicine

## 2010-12-25 ENCOUNTER — Ambulatory Visit (INDEPENDENT_AMBULATORY_CARE_PROVIDER_SITE_OTHER)
Admission: RE | Admit: 2010-12-25 | Discharge: 2010-12-25 | Disposition: A | Discharge status: Home / Self Care | Payer: Medicare Other | Source: Ambulatory Visit | Attending: Family Medicine | Admitting: Family Medicine

## 2010-12-25 VITALS — BP 112/56 | HR 75 | Temp 98.0°F | Wt 251.1 lb

## 2010-12-25 DIAGNOSIS — E669 Obesity, unspecified: Secondary | ICD-10-CM | POA: Insufficient documentation

## 2010-12-25 DIAGNOSIS — K37 Unspecified appendicitis: Secondary | ICD-10-CM | POA: Insufficient documentation

## 2010-12-25 DIAGNOSIS — I10 Essential (primary) hypertension: Secondary | ICD-10-CM | POA: Insufficient documentation

## 2010-12-25 DIAGNOSIS — H269 Unspecified cataract: Secondary | ICD-10-CM | POA: Insufficient documentation

## 2010-12-25 DIAGNOSIS — G8929 Other chronic pain: Secondary | ICD-10-CM | POA: Insufficient documentation

## 2010-12-25 DIAGNOSIS — Z794 Long term (current) use of insulin: Secondary | ICD-10-CM | POA: Insufficient documentation

## 2010-12-25 DIAGNOSIS — C181 Malignant neoplasm of appendix: Secondary | ICD-10-CM

## 2010-12-25 DIAGNOSIS — M549 Dorsalgia, unspecified: Secondary | ICD-10-CM | POA: Insufficient documentation

## 2010-12-25 DIAGNOSIS — I2510 Atherosclerotic heart disease of native coronary artery without angina pectoris: Secondary | ICD-10-CM | POA: Insufficient documentation

## 2010-12-25 DIAGNOSIS — K429 Umbilical hernia without obstruction or gangrene: Secondary | ICD-10-CM | POA: Insufficient documentation

## 2010-12-25 DIAGNOSIS — Z23 Encounter for immunization: Secondary | ICD-10-CM | POA: Insufficient documentation

## 2010-12-25 DIAGNOSIS — E119 Type 2 diabetes mellitus without complications: Secondary | ICD-10-CM

## 2010-12-25 DIAGNOSIS — R1031 Right lower quadrant pain: Secondary | ICD-10-CM

## 2010-12-25 DIAGNOSIS — K3580 Unspecified acute appendicitis: Secondary | ICD-10-CM

## 2010-12-25 DIAGNOSIS — E785 Hyperlipidemia, unspecified: Secondary | ICD-10-CM | POA: Insufficient documentation

## 2010-12-25 DIAGNOSIS — H9190 Unspecified hearing loss, unspecified ear: Secondary | ICD-10-CM | POA: Insufficient documentation

## 2010-12-25 DIAGNOSIS — M720 Palmar fascial fibromatosis [Dupuytren]: Secondary | ICD-10-CM | POA: Insufficient documentation

## 2010-12-25 DIAGNOSIS — K4020 Bilateral inguinal hernia, without obstruction or gangrene, not specified as recurrent: Secondary | ICD-10-CM | POA: Insufficient documentation

## 2010-12-25 DIAGNOSIS — L570 Actinic keratosis: Secondary | ICD-10-CM | POA: Insufficient documentation

## 2010-12-25 DIAGNOSIS — G4733 Obstructive sleep apnea (adult) (pediatric): Secondary | ICD-10-CM | POA: Insufficient documentation

## 2010-12-25 DIAGNOSIS — K219 Gastro-esophageal reflux disease without esophagitis: Secondary | ICD-10-CM | POA: Insufficient documentation

## 2010-12-25 DIAGNOSIS — Z01810 Encounter for preprocedural cardiovascular examination: Secondary | ICD-10-CM | POA: Insufficient documentation

## 2010-12-25 DIAGNOSIS — Z9861 Coronary angioplasty status: Secondary | ICD-10-CM | POA: Insufficient documentation

## 2010-12-25 HISTORY — PX: UMBILICAL HERNIA REPAIR: SHX196

## 2010-12-25 HISTORY — PX: APPENDECTOMY: SHX54

## 2010-12-25 HISTORY — PX: HERNIA REPAIR: SHX51

## 2010-12-25 LAB — CBC WITH DIFFERENTIAL/PLATELET
Basophils Absolute: 0 10*3/uL (ref 0.0–0.1)
Lymphocytes Relative: 13.7 % (ref 12.0–46.0)
Lymphs Abs: 1.2 10*3/uL (ref 0.7–4.0)
Monocytes Relative: 8.2 % (ref 3.0–12.0)
Neutrophils Relative %: 77.6 % — ABNORMAL HIGH (ref 43.0–77.0)
Platelets: 241 10*3/uL (ref 150.0–400.0)
RDW: 15.8 % — ABNORMAL HIGH (ref 11.5–14.6)

## 2010-12-25 LAB — URINALYSIS, ROUTINE W REFLEX MICROSCOPIC
Bilirubin Urine: NEGATIVE
Glucose, UA: NEGATIVE mg/dL
Hgb urine dipstick: NEGATIVE
Ketones, ur: NEGATIVE mg/dL
pH: 5.5 (ref 5.0–8.0)

## 2010-12-25 LAB — BASIC METABOLIC PANEL
CO2: 28 mEq/L (ref 19–32)
Calcium: 9.3 mg/dL (ref 8.4–10.5)
Chloride: 101 mEq/L (ref 96–112)
Sodium: 138 mEq/L (ref 135–145)

## 2010-12-25 LAB — PROTIME-INR
INR: 1.05 (ref 0.00–1.49)
Prothrombin Time: 13.9 seconds (ref 11.6–15.2)

## 2010-12-25 LAB — URINE MICROSCOPIC-ADD ON

## 2010-12-25 LAB — GLUCOSE, CAPILLARY: Glucose-Capillary: 117 mg/dL — ABNORMAL HIGH (ref 70–99)

## 2010-12-25 LAB — SAMPLE TO BLOOD BANK

## 2010-12-25 LAB — TYPE AND SCREEN: ABO/RH(D): A POS

## 2010-12-25 MED ORDER — IOHEXOL 300 MG/ML  SOLN
100.0000 mL | Freq: Once | INTRAMUSCULAR | Status: AC | PRN
  Administered 2010-12-25: 100 mL via INTRAVENOUS

## 2010-12-25 NOTE — Assessment & Plan Note (Signed)
Concern for appendicitis.  Send for bmet/cbc and then scan.  Pt agrees with plan.  Will await lab, CT results.

## 2010-12-25 NOTE — Assessment & Plan Note (Signed)
He'll call back about referral to derm after other issues (RLQ pain and pending eye surgery) are resolved.

## 2010-12-25 NOTE — Assessment & Plan Note (Signed)
He'll call back about referral to hand center after other issues (RLQ pain and pending eye surgery) are resolved.

## 2010-12-25 NOTE — Progress Notes (Signed)
Abd pain.  Woke up yesterday from a nap and had RLQ pain.  Used a suppository and had several BMs in meantime with dec in pain after BMs.  Recent trip to [place] antecedent to sx.  BMs are small.  Pain is improved today compared to yesterday. No radiation to pain.  No vomiting.  No blood in stool.  Looser stool this AM.  No nausea.  Pain had been constant yesterday.  Pain is intermittent today, worse with certain movements. No fevers.    No abd surgery, still has GB and appendix.   Skin changes on face, h/o sun exposure.    H/o dupuytren's contracture.  Now with dec in ROM for his 4th/5th fingers.     PMH and SH reviewed  ROS: See HPI, otherwise noncontributory.  Meds, vitals, and allergies reviewed.   nad ncat Mmm rrr with soft SEM noted ctab abd soft, normal BS, ttp but w/o rebound in RLQ.  Not ttp o/w.   Midline hernia noted.  Diffuse AKs noted on face B dupuytren's contracture noted.

## 2010-12-25 NOTE — Patient Instructions (Signed)
See Shirlee Limerick about your referral before your leave today. Call back about going to the hand center and the dermatologist.

## 2010-12-26 ENCOUNTER — Encounter: Payer: Self-pay | Admitting: Family Medicine

## 2010-12-26 ENCOUNTER — Telehealth: Payer: Self-pay | Admitting: *Deleted

## 2010-12-26 LAB — GLUCOSE, CAPILLARY
Glucose-Capillary: 142 mg/dL — ABNORMAL HIGH (ref 70–99)
Glucose-Capillary: 143 mg/dL — ABNORMAL HIGH (ref 70–99)
Glucose-Capillary: 187 mg/dL — ABNORMAL HIGH (ref 70–99)
Glucose-Capillary: 206 mg/dL — ABNORMAL HIGH (ref 70–99)

## 2010-12-26 LAB — BASIC METABOLIC PANEL
CO2: 27 mEq/L (ref 19–32)
GFR calc non Af Amer: 29 mL/min — ABNORMAL LOW (ref >90)
Glucose, Bld: 209 mg/dL — ABNORMAL HIGH (ref 70–99)
Potassium: 4.9 mEq/L (ref 3.5–5.1)
Sodium: 136 mEq/L (ref 135–145)

## 2010-12-26 LAB — CBC
Hemoglobin: 11.4 g/dL — ABNORMAL LOW (ref 13.0–17.0)
MCHC: 33.6 g/dL (ref 30.0–36.0)
Platelets: 224 10*3/uL (ref 150–400)
RBC: 3.73 MIL/uL — ABNORMAL LOW (ref 4.22–5.81)

## 2010-12-26 NOTE — Telephone Encounter (Signed)
Fax from call a nurse is on your desk.  Pt given results of CT scan by Dr. Molli Posey.

## 2010-12-26 NOTE — Telephone Encounter (Signed)
Pt was sent to [facility] last night.

## 2010-12-27 DIAGNOSIS — N179 Acute kidney failure, unspecified: Secondary | ICD-10-CM

## 2010-12-27 LAB — BASIC METABOLIC PANEL
CO2: 26 mEq/L (ref 19–32)
Calcium: 9.3 mg/dL (ref 8.4–10.5)
Creatinine, Ser: 1.83 mg/dL — ABNORMAL HIGH (ref 0.50–1.35)
GFR calc Af Amer: 42 mL/min — ABNORMAL LOW (ref >90)
Sodium: 135 mEq/L (ref 135–145)

## 2010-12-27 LAB — GLUCOSE, CAPILLARY
Glucose-Capillary: 188 mg/dL — ABNORMAL HIGH (ref 70–99)
Glucose-Capillary: 244 mg/dL — ABNORMAL HIGH (ref 70–99)

## 2010-12-28 LAB — BASIC METABOLIC PANEL
BUN: 30 mg/dL — ABNORMAL HIGH (ref 6–23)
CO2: 27 mEq/L (ref 19–32)
Chloride: 100 mEq/L (ref 96–112)
Glucose, Bld: 266 mg/dL — ABNORMAL HIGH (ref 70–99)
Potassium: 4.7 mEq/L (ref 3.5–5.1)
Sodium: 135 mEq/L (ref 135–145)

## 2011-01-02 ENCOUNTER — Telehealth (INDEPENDENT_AMBULATORY_CARE_PROVIDER_SITE_OTHER): Payer: Self-pay | Admitting: General Surgery

## 2011-01-02 NOTE — Telephone Encounter (Signed)
Patient added to GI tumor board by email to [email or web address]

## 2011-01-02 NOTE — Telephone Encounter (Signed)
Message copied by Liliana Cline on Tue Jan 02, 2011  2:12 PM ------      Message from: Andrey Campanile, ERIC M      Created: Tue Jan 02, 2011  2:02 PM       Please discuss path with pt at his f/u appt. Has appt for DOW 11/6.  Incidental finding of carcinoid. Nothing else to be done surgically-margins negative. Will discuss at GI Tumor board next Wed.  Waylin Dorko-pls add him to tumor board next week

## 2011-01-03 ENCOUNTER — Encounter: Payer: Self-pay | Admitting: Family Medicine

## 2011-01-04 ENCOUNTER — Ambulatory Visit (INDEPENDENT_AMBULATORY_CARE_PROVIDER_SITE_OTHER): Payer: Medicare Other | Admitting: Family Medicine

## 2011-01-04 ENCOUNTER — Encounter: Payer: Self-pay | Admitting: Family Medicine

## 2011-01-04 DIAGNOSIS — R05 Cough: Secondary | ICD-10-CM

## 2011-01-04 MED ORDER — BENZONATATE 200 MG PO CAPS: 200.0000 mg | ORAL_CAPSULE | Freq: Three times a day (TID) | ORAL | Status: AC | PRN

## 2011-01-04 MED ORDER — AZITHROMYCIN 250 MG PO TABS: ORAL_TABLET | ORAL | Status: AC

## 2011-01-04 NOTE — Discharge Summary (Signed)
[name] [name], [name] [name]             ACCOUNT NO.:  [id number]  MEDICAL RECORD NO.:  [id number]  LOCATION:  5148                         FACILITY:  [facility]  PHYSICIAN:  Cherylynn Ridges, M.D.    DATE OF BIRTH:  19-Jun-1940  DATE OF ADMISSION:  12/25/2010 DATE OF DISCHARGE:  12/28/2010                              DISCHARGE SUMMARY   ADMITTING PHYSICIAN:  Mary Sella. Andrey Campanile, MD.  DISCHARGING PHYSICIAN:  Cherylynn Ridges, MD.  PROCEDURES:  Laparoscopic appendectomy by Dr. Gaynelle Adu on December 25, 2010.  CONSULTANT:  None.  PRIMARY CARE PHYSICIAN:  Dwana Curd. Para March, MD.  REASON FOR ADMISSION:  [name] [name] is a 70 year old male who developed right lower quadrant pain the day prior to admission.  The pain has intensified, it is sharp and become constant.  He went to his primary care doctor's office for evaluation.  There was concern for appendicitis.  A CT scan was ordered, which demonstrated acute appendicitis.  He was sent to the emergency department for evaluation. Please see admitting history and physical for further details.  ADMITTING DIAGNOSES: 1. Acute appendicitis. 2. Obesity. 3. Diabetes mellitus. 4. Hypertension. 5. Gastroesophageal reflux disease. 6. Hyperlipidemia. 7. Obstructive sleep apnea. 8. Coronary artery disease. 9. History of congestive heart failure. 10.Umbilical hernia. 11.Bilateral inguinal hernia. 12.Plavix therapy.  HOSPITAL COURSE:  At this time, the patient was admitted.  He was placed on IV antibiotics.  He was emergently taken to the operating room where he underwent a laparoscopic appendectomy and open primary repair of an umbilical hernia.  The patient tolerated this procedure well. Postoperatively, he was having some discomfort.  No nausea or vomiting. He was tolerating clears.  His Foley was removed.  However, he had not voided yet.  Secondary to urinary retention, he was kept.  On postoperative day #2, the patient was noted to have a decrease in  his blood pressure.  His creatinine had elevated to 2.2, which was elevated from admission at 1.2.  His hypotension and elevated creatinine were likely thought to be secondary to dehydration.  He was started back on IV fluids.  By postoperative day #3, the patient's blood pressure had normalized.  His creatinine had decreased to 1.28.  He was tolerating a regular diet and was otherwise stable for discharge home.  DISCHARGE DIAGNOSES: 1. Acute appendicitis status post laparoscopic appendectomy. 2. Acute renal failure and hypotension secondary to dehydration, now     resolved. 3. Acute appendicitis. 4. Obesity. 5. Diabetes mellitus. 6. Hypertension. 7. Gastroesophageal reflux disease. 8. Hyperlipidemia. 9. Obstructive sleep apnea. 10.Coronary artery disease. 11.History of congestive heart failure. 12.Umbilical hernia. 13.Bilateral inguinal hernia. 14.Plavix therapy.  DISCHARGE MEDICATIONS:  The patient may resume home medications including Aleve, Altace, Antara, aspirin, coenzyme Q10, Crestor, fish oil, Klor-Con, Lipitor, Lopressor, Lantus, Lasix, metformin, nitroglycerin, promethazine, Plavix, Prandin, Prilosec, ranitidine, silver sulfadiazine, vitamin D2, Zetia, Zoloft, and diaper rash ointment, which is applied to his feet.  He was given a new prescription for Percocet 5/325 1-2 p.o. q.4 hours p.r.n. pain.  DISCHARGE INSTRUCTIONS:  The patient may increase his activity slowly and walk up steps.  He may shower, however, he is not to bathe.  He is not to  do any heavy lifting for the next 3 weeks.  He is to resume a diabetic diet.  He is to follow up with the [facility] at our office on January 09, 2011 at 3:50 p.m.     Letha Cape, PA   ______________________________ Cherylynn Ridges, M.D.    KEO/MEDQ  D:  12/28/2010  T:  12/28/2010  Job:  [id number]  cc:   Mary Sella. Andrey Campanile, MD Dwana Curd. Para March, M.D.  Electronically Signed by [name] PA on 01/03/2011 10:53:39  AM Electronically Signed by [name] M.D. on 01/04/2011 06:51:13 PM

## 2011-01-04 NOTE — Patient Instructions (Signed)
Start the antibiotic today and let us know if you get worse.  Take care.

## 2011-01-04 NOTE — Progress Notes (Signed)
Monday last week with appy done.  Home from [facility] 1 week ago. Cough and chest congestion since the intubation.  No fevers, but had some sweats.  Some sputum, discolored usually, occ clear.  He can get a deep breath.  Cough comes on in fits.  He's taking mucinex.  He's some better today than yesterday.    Abd pain controlled with tylenol except when he's coughing.  Eating well, + BM  Meds, vitals, and allergies reviewed.   ROS: See HPI.  Otherwise, noncontributory.  GEN: nad, alert and oriented HEENT: mucous membranes moist, tm w/o erythema, nasal exam w/o erythema, clear discharge noted,  OP with mild cobblestoning NECK: supple w/o LA CV: rrr.   PULM: no inc wob but diffuse (though still mild) coarse BS ABD with small amount of bruising at prev incision sites; appears to be healing well w/o erythema SKIN: no acute rash

## 2011-01-05 ENCOUNTER — Encounter: Payer: Self-pay | Admitting: Family Medicine

## 2011-01-05 DIAGNOSIS — R05 Cough: Secondary | ICD-10-CM | POA: Insufficient documentation

## 2011-01-05 NOTE — Assessment & Plan Note (Addendum)
Proceed with abx given the recent intubation and other comorbid conditions. Nontoxic. Okay for outpatient f/u, call back if worse. Not hypoxic,no inc in wob.  Supportive tx o/w, he agrees with plan.

## 2011-01-06 ENCOUNTER — Encounter: Payer: Self-pay | Admitting: Family Medicine

## 2011-01-06 NOTE — Op Note (Signed)
[name] [name], [name] [name]             ACCOUNT NO.:  [id number]  MEDICAL RECORD NO.:  [id number]  LOCATION:  [facility]                         FACILITY:  [facility]  PHYSICIAN:  Mary Sella. Andrey Campanile, MD     DATE OF BIRTH:  [birth year]  DATE OF PROCEDURE:  12/25/2010 DATE OF DISCHARGE:                              OPERATIVE REPORT   PREOPERATIVE DIAGNOSES: 1. Acute appendicitis. 2. Umbilical hernia.  POSTOPERATIVE DIAGNOSES: 1. Acute appendicitis. 2. Umbilical hernia. 3. Bilateral indirect inguinal hernia.  PROCEDURES: 1. Laparoscopic appendectomy. 2. Open primary repair of umbilical hernia.  SURGEON:  Mary Sella. Andrey Campanile, MD  ASSISTANT SURGEON:  None.  ANESTHESIA:  General plus local consisting 0.25% Marcaine with epi.  FINDINGS:  The appendix was acutely inflamed and non perforated.  He had umbilical hernia defect of approximately 1-1/2 cm it was repaired primarily.  SPECIMEN:  Appendix.  ESTIMATED BLOOD LOSS:  Minimal.  INDICATIONS FOR PROCEDURE:  Patient is an obese 70 year old Caucasian male with insulin-dependent diabetes mellitus, history of congestive heart failure, coronary artery disease,  obstructive sleep apnea, hypertension, hyperlipidemia, who developed right-sided abdominal pain yesterday.  It is very sharp and intense.  He went to his primary care physician's office today complaining of right-sided pain.  His primary care doctor was concerned for acute appendicitis and he ordered labs and a CT scan.  The CT scan was consistent with acute appendicitis.  The patient's physical exam also revealed localized peritonitis in right lower quadrant.  We discussed the risks and benefits of operative intervention including but not limited to, bleeding, infection, injury to surrounding structures, need to convert to an open procedure, DVT formation, abscess formation, surgical site wound infection, urinary retention, staple line complications such as bleeding or leak and incisional  hernia formation.  The patient also had a umbilical hernia. We discussed that we would need to repair that at the time of surgery since that would be the site for I would gain entry to the abdominal cavity.  I explained to the patient that we would only be able to primarily repair it and not be able to use synthetic mesh since he had an infection within his abdomen which would predispose into a mesh infection.  I explained given his multiple comorbidities such as his weight, his diabetes, and his sleep apnea, as well as being an emergent procedure that he was at risk for recurrence.  He elected to proceed to the operating room for the above-mentioned procedure.  DESCRIPTION OF PROCEDURE:  After obtaining informed consent, the patient was taken urgently to the operating room and placed supine on the operating room table.  Sequential compression devices were placed. General endotracheal anesthesia was established.  His abdomen was prepped and draped in usual standard surgical fashion.  He received Unasyn prior to skin incision.  Surgical time-out was performed.  Local was infiltrated at the base of the umbilicus.  Next, a 1.5 cm vertical infraumbilical incision was made with a #11 blade.  The fascia was grasped with Kochers and lifted anteriorly.  Next, the fascia was incised with a #11 blade.  The abdominal cavity was entered.  A purse- string suture was placed around the fascial edges using  a 0 Vicryl.  A 12-mm Hasson trocar was placed and pneumoperitoneum was smoothly established up to patient's pressure of 15 mmHg.  The laparoscope was advanced.  There was no evidence of injury to surrounding structures. The patient has bilateral indirect inguinal hernias.  He was placed in Trendelenburg and rotated slightly to the left.  I placed a two 5 mm trocars, one in the left lower quadrant, one in the suprapubic area, all under direct visualization after local had been infiltrated.  Cecum  was identified.  The terminal ileum was identified.  The appendix was very long, down into his pelvis.  The base and the body appeared normal, however, the tip was indurated and the mesoappendix near the tip was indurated and inflamed as well.  There was no signs of perforation or pus within the abdominal cavity.  I took down the mesoappendix in a serial fashion using the Harmonic scalpel.  The appendiceal base was dissected out and around with the aid of a Teaching laboratory technician.  I finished taking down the mesoappendix completely.  The laparoscope was then placed in left lower quadrant. Then obtaining articulating laparoscopic GIA stapler with a 45-mm white load, I stapled across the base of the appendix incorporating a small amount of cecum.  The appendix was freed and placed within the specimen bag and brought out through the umbilical trocar.  Pneumoperitoneum was reestablished.  I then irrigated the right lower quadrant with 1 L saline.  The staple line was intact.  There was no evidence of bleeding.  There was no evidence of enteric contents leaking from the staple line.  I then removed the Hasson trocar.  There was a small fascial defect above for I made that extended toward the patient's head.  I ended up enlarging mass skin incision sharply with a knife.  I then removed the small hernia sac sharply with electrocautery.  I then elevated some of the skin and subcutaneous tissue off the fascial edges.  I then closed the fascia defect primarily with 5 interrupted #1 Novafil sutures.  I then reestablished pneumoperitoneum and inspected the closure. Laparoscopically, there is nothing within our fascial closure.  There was no air leak at the umbilicus.  Pneumoperitoneum was released.  The remaining two 5 mm trocars were removed.  All skin incisions were closed with 4-0 Monocryl subcuticular fashion.  It should be noted that prior to closing the umbilical skin, I did irrigate the wound with  saline. Benzoin, Steri-Strips, sterile bandages were applied.  The patient was extubated and taken to recovery room in stable addition.  All needle, instrument, sponge counts were correct x2.     Mary Sella. Andrey Campanile, MD     EMW/MEDQ  D:  12/25/2010  T:  12/25/2010  Job:  [id number]  cc:   Dwana Curd. Para March, M.D.  Electronically Signed by Gaynelle Adu M.D. on 01/06/2011 12:23:54 PM

## 2011-01-06 NOTE — H&P (Signed)
NAMEVONZELL, LINDBLAD             ACCOUNT NO.:  [id number]  MEDICAL RECORD NO.:  [id number]  LOCATION:  [facility]                         FACILITY:  [facility]  PHYSICIAN:  Mary Sella. Andrey Campanile, MD     DATE OF BIRTH:  04-17-1940  DATE OF ADMISSION:  12/25/2010 DATE OF DISCHARGE:                             HISTORY & PHYSICAL   ADMITTING SERVICE:  [facility].  PRIMARY CARE PHYSICIAN:  Dr. Para March.  CHIEF COMPLAINT:  Abdominal pain.  HISTORY OF PRESENT ILLNESS:  The patient is an obese 70 year old Caucasian male who developed right-sided abdominal pain yesterday.  He states at that time, the pain was very intense, sharp, and constant.  It was primarily on his right side.  He was still having discomfort today, but it was not as intense, but he still went to his primary care physician's office for evaluation.  He was concerned for possible appendicitis, so he ordered lab work and sent him to the radiologist for CT scan of the abdomen and pelvis which demonstrated findings consistent with acute appendicitis.  He was subsequently sent to the emergency department for evaluation.  He denies any fevers, chills, nausea, vomiting, diarrhea, or constipation.  He denies any previous symptoms.  He denies any weight change.  He was able to eat some breakfast this morning, consisting of cereal.  He has not eaten anything since.  He denies any melena or hematochezia.  His last bowel movement was earlier today around 3:30. He does have a history of coronary artery disease.  He currently denies any chest pain or recent chest pain, shortness of breath, orthopnea, or paroxysmal nocturnal dyspnea.  He denies any amaurosis fugax or TIA.  He has some mild dyspnea on exertion if it is really hot outside, positive nocturia.  PAST MEDICAL HISTORY: 1. Insulin-dependent diabetes mellitus. 2. History of congestive heart failure. 3. Coronary artery disease. 4. Obstructive sleep apnea, does not tolerate  CPAP. 5. Hypertension. 6. Hyperlipidemia. 7. Cataracts. 8. Gastroesophageal reflux disease. 9. Decreased hearing.  PAST SURGICAL HISTORY:  CABG.  ALLERGIES:  CELEBREX which causes a rash.  MEDICATIONS:  Include Altace, aspirin, atorvastatin, Crestor, fenofibrate, Lasix, Lantus, metformin, metoprolol, Phenergan, Plavix, potassium chloride, Prandin, Prilosec, Zantac, Zetia, and Zoloft.  SOCIAL HISTORY:  Denies drugs, alcohol, or tobacco.  He is married.  REVIEW OF SYSTEMS:  A comprehensive 12-point review of systems were performed.  All systems were negative except for what is mentioned in the HPI.  He does have some history of ulcers on his legs.  He states his last hemoglobin A1c was 7.2.  PHYSICAL EXAMINATION:  VITAL SIGNS:  Temperature 98.1, heart rate 68, blood pressure 134/53, respirations 14, saturation 99 on room air. GENERAL:  A well-developed, well-nourished, obese Caucasian male, lying on the stretcher, in no apparent distress. HEENT:  Atraumatic, normocephalic.  Pupils are equal and reactive.  No scleral icterus.  No external ear lesions.  Hearing grossly normal except for some decreased hearing on the right. NECK:  Nontender.  He has got a short thick neck.  Trachea is midline. PULMONARY:  Lungs are clear.  Symmetric chest rise.  No accessory use of muscles. CARDIOVASCULAR:  Regular rhythm.  I do  detect a small grade 1/6 systolic ejection murmur.  Palpable pulses. ABDOMEN:  Obese, soft.  He has right lower quadrant tenderness and some voluntary guarding.  No rebound.  I would say he has got localized right lower quadrant peritonitis.  He has a reducible 2-cm umbilical hernia. He also has epigastric rectus diastasis. MUSCULOSKELETAL:  Free range of motion.  Moves all extremities normally. Strength is symmetric.  Tone is symmetric. NEUROLOGIC:  Nonfocal.  Sensation grossly intact. SKIN:  No jaundice.  No edema.  He does have some mild cellulitis in bilateral lower  extremities, right greater than left in the mid shin area.  It is blanching.  There is no active ulcers, however, there are about 3 on the right and 3 on the left that have healed, but none were active draining. PSYCHIATRIC:  Alert and oriented x3.  Judgment and insight appeared appropriate.  LABS:  Sodium 138, potassium 3.8, chloride 101, bicarb 28, BUN 22, creatinine 1.2, blood sugar 125, calcium 9.3.  White count 8.9, hemoglobin 13, hematocrit 38.7, platelet count 241.  RADIOGRAPHS:  CT abdomen and pelvis was personally reviewed by me as well as on reviewing the final report, there is fatty infiltration of liver.  He has got bilateral inguinal hernias.  He has a dilated appendix with some periappendiceal edema and inflammation.  IMPRESSION:  A 70 year old Caucasian male with, 1. Obesity. 2. Insulin-dependent diabetes mellitus. 3. Hypertension. 4. Gastroesophageal reflux disease. 5. Hyperlipidemia. 6. Obstructive sleep apnea. 7. Coronary artery disease. 8. History of congestive heart failure. 9. Umbilical hernia. 10.Bilateral inguinal hernias. 11.Acute appendicitis. 12.Plavix therapy.  PLAN:  We discussed management of acute appendicitis including operative and nonoperative management.  The patient will be maintained n.p.o.  We will start IV antibiotics and place sequential compression devices.  I have recommended that he go to operating room for laparoscopic appendectomy and open primary repair of his umbilical hernia.  I will have to get through the umbilicus and will need to repair the fascial defect on the way out.  I informed the patient that we would not be able to use mesh due to the fact that he has appendicitis and his abdomen has an infection which precludes the use of mesh.  We did discuss alternative treatment options such as IV antibiotics alone. I explained that with an appendectomy his improvement in his symptoms are good. However, the patient has elected to  proceed to the operating room for the above-mentioned procedure.     Mary Sella. Andrey Campanile, MD     EMW/MEDQ  D:  12/25/2010  T:  12/25/2010  Job:  [id number]  cc:   Dr. Para March  Electronically Signed by Gaynelle Adu M.D. on 01/06/2011 12:22:30 PM

## 2011-01-09 ENCOUNTER — Ambulatory Visit (INDEPENDENT_AMBULATORY_CARE_PROVIDER_SITE_OTHER): Payer: Medicare Other | Admitting: General Surgery

## 2011-01-09 ENCOUNTER — Encounter (INDEPENDENT_AMBULATORY_CARE_PROVIDER_SITE_OTHER): Payer: Self-pay | Admitting: General Surgery

## 2011-01-09 DIAGNOSIS — K3580 Unspecified acute appendicitis: Secondary | ICD-10-CM

## 2011-01-09 DIAGNOSIS — D3A8 Other benign neuroendocrine tumors: Secondary | ICD-10-CM | POA: Insufficient documentation

## 2011-01-09 DIAGNOSIS — D3A00 Benign carcinoid tumor of unspecified site: Secondary | ICD-10-CM

## 2011-01-09 NOTE — Patient Instructions (Signed)
See Dr. Para March about pulmonary congestion.  Miralax for your constipation.  Dr. Andrey Campanile will call you with results of conference.

## 2011-01-09 NOTE — Progress Notes (Signed)
[name] [name] 02/11/1941 [id number] 01/09/2011   History of Present Illness: [name] [name] is a  70 y.o. male who presents today status post lap appendectomy and umbilical hernia repair..  Pathology reveals Acute appendicitis, and serositis.  There was also a well Differentiated Neuroendocrine Tumor. It was 0.2 cm with margins negitive. The patient is tolerating a regular diet, having normal bowel movements, has good pain control.  He  is back to most normal activities.   Physical Exam: Abd: soft, nontender, active bowel sounds, nondistended.  All incisions are well healed. Chest:  Cough, productive, on 5 day z-pak.  To see Dr Para March Primary care tomorrow. Impression: 1.  Acute appendicitis, s/p lap appy.  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 Andrey Campanile will discuss him with the Tumor Board 01/10/11, and call the patient back, and arrange any further follow up if needed.

## 2011-01-10 ENCOUNTER — Ambulatory Visit (INDEPENDENT_AMBULATORY_CARE_PROVIDER_SITE_OTHER): Payer: Medicare Other | Admitting: Family Medicine

## 2011-01-10 ENCOUNTER — Encounter: Payer: Self-pay | Admitting: Family Medicine

## 2011-01-10 DIAGNOSIS — R05 Cough: Secondary | ICD-10-CM

## 2011-01-10 DIAGNOSIS — D3A8 Other benign neuroendocrine tumors: Secondary | ICD-10-CM

## 2011-01-10 NOTE — Assessment & Plan Note (Signed)
Await tumor board input.

## 2011-01-10 NOTE — Patient Instructions (Signed)
I think this will gradually/slowly get better.  Get some rest and take the tessalon for cough.

## 2011-01-10 NOTE — Assessment & Plan Note (Signed)
Improved, gradually better.  No reason to continue abx.  Supportive tx and this should gradually resolved.

## 2011-01-10 NOTE — Progress Notes (Signed)
F/u for cough.  Less color to sputum; still productive early in AM.  "I'm better but not well."  Still coughing.  Lost voice.  No fevers.  No ear pain, occ rhinorrhea, no ST.  He can get a deep breath, better than prev.  Done with abx recently.    Meds, vitals, and allergies reviewed.   ROS: See HPI.  Otherwise, noncontributory.  GEN: nad, alert and oriented  HEENT: mucous membranes moist, tm w/o erythema, nasal exam w/o erythema, clear discharge noted, OP with minimal cobblestoning  NECK: supple w/o LA  CV: rrr.  PULM: no inc wob, ctab ABD with bruising decreased compared to prev SKIN: no acute rash

## 2011-01-12 ENCOUNTER — Encounter: Payer: Self-pay | Admitting: Family Medicine

## 2011-01-12 ENCOUNTER — Telehealth (INDEPENDENT_AMBULATORY_CARE_PROVIDER_SITE_OTHER): Payer: Self-pay | Admitting: General Surgery

## 2011-01-12 NOTE — Telephone Encounter (Signed)
Spoke with pt regarding tumor board discussion regarding incidental neuroendocrine tumor (carcinoid) found in appx after appendectomy for acute appendicitis. Nothing else to be done. Surgery was treatment. Needs no surveillance imaging.

## 2011-01-28 ENCOUNTER — Encounter: Payer: Self-pay | Admitting: Family Medicine

## 2011-03-05 ENCOUNTER — Other Ambulatory Visit: Payer: Self-pay | Admitting: *Deleted

## 2011-03-05 MED ORDER — SERTRALINE HCL 50 MG PO TABS: 50.0000 mg | ORAL_TABLET | Freq: Every day | ORAL | Status: DC

## 2011-03-09 DIAGNOSIS — E119 Type 2 diabetes mellitus without complications: Secondary | ICD-10-CM | POA: Insufficient documentation

## 2011-03-09 DIAGNOSIS — I10 Essential (primary) hypertension: Secondary | ICD-10-CM | POA: Insufficient documentation

## 2011-03-09 DIAGNOSIS — Z0389 Encounter for observation for other suspected diseases and conditions ruled out: Secondary | ICD-10-CM | POA: Insufficient documentation

## 2011-03-09 DIAGNOSIS — R600 Localized edema: Secondary | ICD-10-CM | POA: Insufficient documentation

## 2011-03-09 DIAGNOSIS — Z8719 Personal history of other diseases of the digestive system: Secondary | ICD-10-CM | POA: Insufficient documentation

## 2011-03-09 DIAGNOSIS — G4733 Obstructive sleep apnea (adult) (pediatric): Secondary | ICD-10-CM | POA: Insufficient documentation

## 2011-03-20 ENCOUNTER — Other Ambulatory Visit: Payer: Self-pay | Admitting: *Deleted

## 2011-03-20 MED ORDER — CLOPIDOGREL BISULFATE 75 MG PO TABS: 75.0000 mg | ORAL_TABLET | Freq: Every day | ORAL | Status: DC

## 2011-03-28 DIAGNOSIS — Z9889 Other specified postprocedural states: Secondary | ICD-10-CM | POA: Insufficient documentation

## 2011-04-24 ENCOUNTER — Other Ambulatory Visit: Payer: Self-pay | Admitting: *Deleted

## 2011-04-27 ENCOUNTER — Other Ambulatory Visit: Payer: Self-pay | Admitting: *Deleted

## 2011-04-27 MED ORDER — METOPROLOL TARTRATE 50 MG PO TABS: 50.0000 mg | ORAL_TABLET | Freq: Two times a day (BID) | ORAL | Status: DC

## 2011-05-02 ENCOUNTER — Telehealth: Payer: Self-pay | Admitting: *Deleted

## 2011-05-02 MED ORDER — FUROSEMIDE 80 MG PO TABS: 80.0000 mg | ORAL_TABLET | Freq: Every day | ORAL | Status: DC

## 2011-05-02 MED ORDER — RAMIPRIL 10 MG PO TABS: 10.0000 mg | ORAL_TABLET | Freq: Every day | ORAL | Status: DC

## 2011-05-03 NOTE — Telephone Encounter (Signed)
ok 

## 2011-05-07 ENCOUNTER — Encounter: Payer: Self-pay | Admitting: Family Medicine

## 2011-05-25 ENCOUNTER — Ambulatory Visit (INDEPENDENT_AMBULATORY_CARE_PROVIDER_SITE_OTHER): Payer: Medicare Other | Admitting: Cardiology

## 2011-05-25 ENCOUNTER — Encounter: Payer: Self-pay | Admitting: Cardiology

## 2011-05-25 VITALS — BP 168/77 | HR 75 | Resp 16 | Ht 70.0 in | Wt 252.0 lb

## 2011-05-25 DIAGNOSIS — I6529 Occlusion and stenosis of unspecified carotid artery: Secondary | ICD-10-CM

## 2011-05-25 DIAGNOSIS — I080 Rheumatic disorders of both mitral and aortic valves: Secondary | ICD-10-CM

## 2011-05-25 DIAGNOSIS — I25810 Atherosclerosis of coronary artery bypass graft(s) without angina pectoris: Secondary | ICD-10-CM

## 2011-05-25 DIAGNOSIS — I739 Peripheral vascular disease, unspecified: Secondary | ICD-10-CM

## 2011-05-25 DIAGNOSIS — E669 Obesity, unspecified: Secondary | ICD-10-CM

## 2011-05-25 DIAGNOSIS — E785 Hyperlipidemia, unspecified: Secondary | ICD-10-CM

## 2011-05-25 DIAGNOSIS — I10 Essential (primary) hypertension: Secondary | ICD-10-CM

## 2011-05-25 NOTE — Assessment & Plan Note (Signed)
Stable. No change in therapy. Patient does carry nitroglycerin. Continue aggressive secondary preventive therapy.

## 2011-05-25 NOTE — Progress Notes (Signed)
HPI [name] comes in today for evaluation and management of his diffuse vascular disease including coronary artery disease, status post bypass surgery, carotid artery disease, and severe peripheral vascular disease with history of leg ulcers and stabilized. He has been a diabetic for over 20 years. His diabetes and his lipids are managed by Dr. Leslie Dales.   He denies any angina or chest discomfort. He has had no orthopnea PND and his edema has been under good control. His wife says he does have some occasional weeping spots and  ulcers but refuses to go to the wound center.  He denies any symptoms of TIAs or mini strokes. His last carotids were May of 2011. He  He is compliant with his medications.  He is having lots of problems with retinopathy and recent had some surgery of his right eye. 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  Past Medical History  Diagnosis Date  . GERD (gastroesophageal reflux disease)   . Hyperlipidemia   . Hypertension   . Diabetes mellitus   . Sleep apnea   . Carotid artery disease   . CAD (coronary artery disease), bypass graft transplanted heart   . Cellulitis and abscess of leg, except foot   . Leg edema   . Right knee sprain   . Cramps, muscle, general   . Low back pain   . Hiatal hernia   . Superficial foreign body of finger without major open wound and without infection   . Chills   . Hearing loss   . Nasal congestion   . Cough   . Wheezing   . Generalized headaches     Current Outpatient Prescriptions  Medication Sig Dispense Refill  . aspirin 81 MG tablet Take 81 mg by mouth daily.        Marland Kitchen atorvastatin (LIPITOR) 40 MG tablet Take 40 mg by mouth daily.        . clopidogrel (PLAVIX) 75 MG tablet Take 1 tablet (75 mg total) by mouth daily.  30 tablet  3  . co-enzyme Q-10 30 MG capsule Take 30 mg by mouth daily.        Marland Kitchen ezetimibe (ZETIA) 10 MG tablet Take 10 mg by mouth daily.        . fenofibrate micronized (ANTARA) 130 MG capsule  Take 130 mg by mouth daily before breakfast.        . fish oil-omega-3 fatty acids 1000 MG capsule Take 2 g by mouth 2 (two) times daily.        . furosemide (LASIX) 80 MG tablet Take 1 tablet (80 mg total) by mouth daily.  30 tablet  2  . insulin glargine (LANTUS) 100 UNIT/ML injection Inject 90 Units into the skin 3 (three) times daily.        Marland Kitchen LOVAZA 1 G capsule       . metFORMIN (GLUCOPHAGE) 1000 MG tablet Take 1,000 mg by mouth 2 (two) times daily with a meal.        . metoprolol (LOPRESSOR) 50 MG tablet Take 1 tablet (50 mg total) by mouth 2 (two) times daily.  60 tablet  6  . omeprazole (PRILOSEC) 20 MG capsule Take 20 mg by mouth daily.        . ramipril (ALTACE) 10 MG tablet Take 1 tablet (10 mg total) by mouth daily.  30 tablet  2  . ranitidine (ZANTAC) 75 MG tablet Take 75 mg by mouth 2 (two) times daily.        Marland Kitchen  repaglinide (PRANDIN) 2 MG tablet Take 2 mg by mouth 3 (three) times daily before meals.        . rosuvastatin (CRESTOR) 20 MG tablet Take 1 tablet (20 mg total) by mouth at bedtime.  30 tablet  11  . sertraline (ZOLOFT) 50 MG tablet Take 1 tablet (50 mg total) by mouth daily.  90 tablet  3  . Vitamin D, Ergocalciferol, (DRISDOL) 50000 UNITS CAPS Take 50,000 Units by mouth every 7 (seven) days.        . nitroGLYCERIN (NITROSTAT) 0.4 MG SL tablet Place 1 tablet (0.4 mg total) under the tongue every 5 (five) minutes as needed for chest pain. May repeat up to 3 times as needed  100 tablet  3    Allergies  Allergen Reactions  . Celecoxib     Family History  Problem Relation Age of Onset  . Diabetes Other     History   Social History  . Marital Status: Married    Spouse Name: N/A    Number of Children: N/A  . Years of Education: N/A   Occupational History  . Not on file.   Social History Main Topics  . Smoking status: Never Smoker   . Smokeless tobacco: Never Used  . Alcohol Use: No  . Drug Use: No  . Sexually Active: Not on file   Other Topics Concern    . Not on file   Social History Narrative  . No narrative on file    ROS ALL NEGATIVE EXCEPT THOSE NOTED IN HPI  PE  General Appearance: well developed, well nourished in no acute distress, obese HEENT: symmetrical face, PERRLA, good dentition  Neck: no JVD, thyromegaly, or adenopathy, trachea midline Chest: symmetric without deformity Cardiac: PMI non-displaced, RRR, normal S1, S2, no gallop , Aortic stenosis murmur but S2 splits Lung: clear to ausculation and percussion Vascular:Left carotid soft bruit, louder right bruit, Pulses absent in the lower extremities are Abdominal: nondistended, nontender, good bowel sounds, no HSM, no bruits Extremities: no cyanosis, clubbing or edema, no sign of DVT, no varicosities , chronic pitting changes and discoloration, healed sores no weeping Skin: normal color, no rashes Neuro: alert and oriented x 3, non-focal Pysch: normal affect  EKG Normal sinus rhythm, rightward axis, no new changes. BMET    Component Value Date/Time   NA 135 12/28/2010 0530   K 4.7 12/28/2010 0530   CL 100 12/28/2010 0530   CO2 27 12/28/2010 0530   GLUCOSE 266* 12/28/2010 0530   BUN 30* 12/28/2010 0530   CREATININE 1.28 12/28/2010 0530   CALCIUM 9.4 12/28/2010 0530   GFRNONAA 56* 12/28/2010 0530   GFRAA 65* 12/28/2010 0530    Lipid Panel  No results found for this basename: chol, trig, hdl, cholhdl, vldl, ldlcalc    CBC    Component Value Date/Time   WBC 8.2 12/26/2010 2225   RBC 3.73* 12/26/2010 2225   HGB 11.4* 12/26/2010 2225   HCT 33.9* 12/26/2010 2225   PLT 224 12/26/2010 2225   MCV 90.9 12/26/2010 2225   MCH 30.6 12/26/2010 2225   MCHC 33.6 12/26/2010 2225   RDW 15.5 12/26/2010 2225   LYMPHSABS 1.2 12/25/2010 1353   MONOABS 0.7 12/25/2010 1353   EOSABS 0.0 12/25/2010 1353   BASOSABS 0.0 12/25/2010 1353

## 2011-05-25 NOTE — Patient Instructions (Addendum)
Your physician recommends that you schedule a follow-up appointment in: 6 months  Your physician has requested that you have a carotid duplex. This test is an ultrasound of the carotid arteries in your neck. It looks at blood flow through these arteries that supply the brain with blood. Allow one hour for this exam. There are no restrictions or special instructions.    

## 2011-05-25 NOTE — Assessment & Plan Note (Signed)
Schedule carotid Dopplers. Continue aggressive secondary therapy.

## 2011-05-25 NOTE — Assessment & Plan Note (Signed)
He states that  his blood pressure is usually under good control at home in the 130s to 140s. He did not take his medicines this morning.

## 2011-05-25 NOTE — Assessment & Plan Note (Signed)
I have made him promise to call me if he develops any healing areas for signs of early cellulitis.

## 2011-05-31 ENCOUNTER — Ambulatory Visit (HOSPITAL_COMMUNITY)
Admission: RE | Admit: 2011-05-31 | Discharge: 2011-05-31 | Disposition: A | Discharge status: Home / Self Care | Payer: Medicare Other | Source: Ambulatory Visit | Attending: Cardiology | Admitting: Cardiology

## 2011-05-31 DIAGNOSIS — E119 Type 2 diabetes mellitus without complications: Secondary | ICD-10-CM | POA: Insufficient documentation

## 2011-05-31 DIAGNOSIS — I10 Essential (primary) hypertension: Secondary | ICD-10-CM | POA: Insufficient documentation

## 2011-05-31 DIAGNOSIS — Z951 Presence of aortocoronary bypass graft: Secondary | ICD-10-CM | POA: Insufficient documentation

## 2011-05-31 DIAGNOSIS — I2510 Atherosclerotic heart disease of native coronary artery without angina pectoris: Secondary | ICD-10-CM | POA: Insufficient documentation

## 2011-05-31 DIAGNOSIS — I6529 Occlusion and stenosis of unspecified carotid artery: Secondary | ICD-10-CM | POA: Insufficient documentation

## 2011-07-25 ENCOUNTER — Telehealth: Payer: Self-pay | Admitting: Cardiology

## 2011-07-25 NOTE — Telephone Encounter (Signed)
Pharmacist calling for medication clarification regarding Crestor. Pt is on Crestor as indicated and is filled by Dr. Leslie Dales according to pharmacist. Mylo Red RN

## 2011-07-25 NOTE — Telephone Encounter (Signed)
[facility] pharmacy calling about whether he is to take crestor. He has not taken since January. Please Clarify

## 2011-08-03 ENCOUNTER — Other Ambulatory Visit: Payer: Self-pay | Admitting: *Deleted

## 2011-08-03 MED ORDER — RAMIPRIL 10 MG PO TABS: 10.0000 mg | ORAL_TABLET | Freq: Every day | ORAL | Status: DC

## 2011-08-03 MED ORDER — FUROSEMIDE 80 MG PO TABS: 80.0000 mg | ORAL_TABLET | Freq: Every day | ORAL | Status: DC

## 2011-08-03 NOTE — Telephone Encounter (Signed)
Faxed refill request   

## 2011-08-04 MED ORDER — CLOPIDOGREL BISULFATE 75 MG PO TABS: 75.0000 mg | ORAL_TABLET | Freq: Every day | ORAL | Status: DC

## 2011-08-04 NOTE — Telephone Encounter (Signed)
Sent!

## 2011-08-09 DIAGNOSIS — Z9889 Other specified postprocedural states: Secondary | ICD-10-CM | POA: Insufficient documentation

## 2011-09-07 ENCOUNTER — Other Ambulatory Visit: Payer: Self-pay | Admitting: Cardiology

## 2011-09-07 MED ORDER — RAMIPRIL 10 MG PO TABS: 10.0000 mg | ORAL_TABLET | Freq: Every day | ORAL | Status: DC

## 2011-10-02 ENCOUNTER — Other Ambulatory Visit: Payer: Self-pay | Admitting: *Deleted

## 2011-10-02 MED ORDER — RAMIPRIL 10 MG PO TABS: 10.0000 mg | ORAL_TABLET | Freq: Every day | ORAL | Status: DC

## 2011-11-29 ENCOUNTER — Other Ambulatory Visit: Payer: Self-pay | Admitting: *Deleted

## 2011-11-29 MED ORDER — FUROSEMIDE 80 MG PO TABS: 80.0000 mg | ORAL_TABLET | Freq: Every day | ORAL | Status: DC

## 2011-12-27 ENCOUNTER — Other Ambulatory Visit: Payer: Self-pay | Admitting: *Deleted

## 2011-12-27 MED ORDER — METOPROLOL TARTRATE 50 MG PO TABS: 50.0000 mg | ORAL_TABLET | Freq: Two times a day (BID) | ORAL | Status: DC

## 2012-01-23 ENCOUNTER — Encounter: Payer: Self-pay | Admitting: Cardiology

## 2012-01-23 ENCOUNTER — Ambulatory Visit (INDEPENDENT_AMBULATORY_CARE_PROVIDER_SITE_OTHER): Payer: Medicare Other | Admitting: Cardiology

## 2012-01-23 VITALS — BP 146/72 | HR 59 | Ht 70.5 in | Wt 254.0 lb

## 2012-01-23 DIAGNOSIS — I6529 Occlusion and stenosis of unspecified carotid artery: Secondary | ICD-10-CM

## 2012-01-23 DIAGNOSIS — E1149 Type 2 diabetes mellitus with other diabetic neurological complication: Secondary | ICD-10-CM

## 2012-01-23 DIAGNOSIS — R0600 Dyspnea, unspecified: Secondary | ICD-10-CM

## 2012-01-23 DIAGNOSIS — I10 Essential (primary) hypertension: Secondary | ICD-10-CM

## 2012-01-23 DIAGNOSIS — R609 Edema, unspecified: Secondary | ICD-10-CM

## 2012-01-23 DIAGNOSIS — R0609 Other forms of dyspnea: Secondary | ICD-10-CM

## 2012-01-23 DIAGNOSIS — I25810 Atherosclerosis of coronary artery bypass graft(s) without angina pectoris: Secondary | ICD-10-CM

## 2012-01-23 DIAGNOSIS — L98499 Non-pressure chronic ulcer of skin of other sites with unspecified severity: Secondary | ICD-10-CM

## 2012-01-23 DIAGNOSIS — R0602 Shortness of breath: Secondary | ICD-10-CM | POA: Insufficient documentation

## 2012-01-23 DIAGNOSIS — E785 Hyperlipidemia, unspecified: Secondary | ICD-10-CM

## 2012-01-23 DIAGNOSIS — I080 Rheumatic disorders of both mitral and aortic valves: Secondary | ICD-10-CM

## 2012-01-23 DIAGNOSIS — E669 Obesity, unspecified: Secondary | ICD-10-CM

## 2012-01-23 DIAGNOSIS — G4730 Sleep apnea, unspecified: Secondary | ICD-10-CM

## 2012-01-23 DIAGNOSIS — R0989 Other specified symptoms and signs involving the circulatory and respiratory systems: Secondary | ICD-10-CM

## 2012-01-23 MED ORDER — FUROSEMIDE 80 MG PO TABS: 80.0000 mg | ORAL_TABLET | Freq: Every day | ORAL | Status: DC

## 2012-01-23 MED ORDER — RAMIPRIL 10 MG PO TABS: 10.0000 mg | ORAL_TABLET | Freq: Every day | ORAL | Status: DC

## 2012-01-23 MED ORDER — EZETIMIBE 10 MG PO TABS: 10.0000 mg | ORAL_TABLET | Freq: Every day | ORAL | Status: DC

## 2012-01-23 MED ORDER — CLOPIDOGREL BISULFATE 75 MG PO TABS: 75.0000 mg | ORAL_TABLET | Freq: Every day | ORAL | Status: DC

## 2012-01-23 MED ORDER — FENOFIBRATE MICRONIZED 130 MG PO CAPS: 130.0000 mg | ORAL_CAPSULE | Freq: Every day | ORAL | Status: DC

## 2012-01-23 MED ORDER — NITROGLYCERIN 0.4 MG SL SUBL: 0.4000 mg | SUBLINGUAL_TABLET | SUBLINGUAL | Status: DC | PRN

## 2012-01-23 MED ORDER — METOPROLOL TARTRATE 50 MG PO TABS: 50.0000 mg | ORAL_TABLET | Freq: Two times a day (BID) | ORAL | Status: DC

## 2012-01-23 NOTE — Assessment & Plan Note (Addendum)
Managed by Dr. Altheimer.  

## 2012-01-23 NOTE — Assessment & Plan Note (Signed)
This was stable in March. He has nonobstructive disease. Continue secondary preventative therapy.

## 2012-01-23 NOTE — Patient Instructions (Addendum)
Your physician recommends that you schedule a follow-up appointment in: 6 MONTHS  Your physician has requested that you have an echocardiogram. Echocardiography is a painless test that uses sound waves to create images of your heart. It provides your doctor with information about the size and shape of your heart and how well your heart's chambers and valves are working. This procedure takes approximately one hour. There are no restrictions for this procedure.     

## 2012-01-23 NOTE — Progress Notes (Signed)
HPI [name] [name] comes in today with his wife for further evaluation and management of his complex cardiovascular and chronic disease history.  His biggest complaint is getting short of breath when he bends over or does some sort of quick activity. He denies any chest pain or chest discomfort with this. He denies orthopnea, PND and his edema has been stable. His legs have been stable without any ulcerations or cellulitis. He is graduated as he puts it from the wound Center.  He just feels chronically ill and does not have much energy. A [address] trip wears him out.  He denies any presyncope or syncope. Last echocardiogram evaluating his mild aortic stenosis was about 3 years ago.  Past Medical History  Diagnosis Date  . GERD (gastroesophageal reflux disease)   . Hyperlipidemia   . Hypertension   . Diabetes mellitus   . Sleep apnea   . Carotid artery disease   . CAD (coronary artery disease), bypass graft transplanted heart   . Cellulitis and abscess of leg, except foot   . Leg edema   . Right knee sprain   . Cramps, muscle, general   . Low back pain   . Hiatal hernia   . Superficial foreign body of finger without major open wound and without infection   . Chills   . Hearing loss   . Nasal congestion   . Cough   . Wheezing   . Generalized headaches     Current Outpatient Prescriptions  Medication Sig Dispense Refill  . aspirin 81 MG tablet Take 81 mg by mouth daily.        . atorvastatin (LIPITOR) 40 MG tablet Take 40 mg by mouth daily.        . clopidogrel (PLAVIX) 75 MG tablet Take 1 tablet (75 mg total) by mouth daily.  30 tablet  12  . co-enzyme Q-10 30 MG capsule Take 30 mg by mouth daily.        . ezetimibe (ZETIA) 10 MG tablet Take 10 mg by mouth daily.        . fenofibrate micronized (ANTARA) 130 MG capsule Take 130 mg by mouth daily before breakfast.        . fish oil-omega-3 fatty acids 1000 MG capsule Take 2 g by mouth 2 (two) times daily.        . furosemide  (LASIX) 80 MG tablet Take 1 tablet (80 mg total) by mouth daily.  30 tablet  9  . insulin glargine (LANTUS) 100 UNIT/ML injection Inject 90 Units into the skin 3 (three) times daily.        . metFORMIN (GLUCOPHAGE) 1000 MG tablet Take 1,000 mg by mouth 2 (two) times daily with a meal.        . metoprolol (LOPRESSOR) 50 MG tablet Take 1 tablet (50 mg total) by mouth 2 (two) times daily.  60 tablet  6  . nitroGLYCERIN (NITROSTAT) 0.4 MG SL tablet Place 1 tablet (0.4 mg total) under the tongue every 5 (five) minutes as needed for chest pain. May repeat up to 3 times as needed  100 tablet  3  . omeprazole (PRILOSEC) 20 MG capsule Take 20 mg by mouth daily.        . ramipril (ALTACE) 10 MG tablet Take 1 tablet (10 mg total) by mouth daily.  30 tablet  3  . ranitidine (ZANTAC) 75 MG tablet Take 75 mg by mouth 2 (two) times daily.        .   repaglinide (PRANDIN) 2 MG tablet Take 2 mg by mouth 3 (three) times daily before meals.        . rosuvastatin (CRESTOR) 20 MG tablet Take 1 tablet (20 mg total) by mouth at bedtime.  30 tablet  11  . sertraline (ZOLOFT) 50 MG tablet Take 1 tablet (50 mg total) by mouth daily.  90 tablet  3  . Vitamin D, Ergocalciferol, (DRISDOL) 50000 UNITS CAPS Take 50,000 Units by mouth every 7 (seven) days.        . [DISCONTINUED] potassium chloride (KLOR-CON) 20 MEQ packet Take 20 mEq by mouth daily.        . [DISCONTINUED] promethazine (PHENERGAN) 25 MG tablet Take 1/2 to 1 tablet up to every 6 hours as needed for nausea.         Allergies  Allergen Reactions  . Celecoxib     Family History  Problem Relation Age of Onset  . Diabetes Other     History   Social History  . Marital Status: Married    Spouse Name: N/A    Number of Children: N/A  . Years of Education: N/A   Occupational History  . Not on file.   Social History Main Topics  . Smoking status: Never Smoker   . Smokeless tobacco: Never Used  . Alcohol Use: No  . Drug Use: No  . Sexually Active: Not  on file   Other Topics Concern  . Not on file   Social History Narrative  . No narrative on file    ROS ALL NEGATIVE EXCEPT THOSE NOTED IN HPI  PE  General Appearance: well developed, well nourished in no acute distress, obese, looks chronically ill skin color better than usual HEENT: symmetrical face, PERRLA, good dentition  Neck: no JVD, thyromegaly, or adenopathy, trachea midline Chest: symmetric without deformity Cardiac: PMI non-displaced, RRR, normal S1, S2, no gallop, aortic stenosis murmur, S2 does split. Lung: clear to ausculation and percussion Vascular: Diminished pulses in lower extremities, no ulcerations, bilateral carotid bruits Abdominal: nondistended, nontender, good bowel sounds, no HSM, no bruits Extremities: no cyanosis, clubbing , no sign of DVT, no varicosities, chronic edematous changes with 1-2+ pitting edema pretibially  Skin: normal color, no rashes, leg discoloration from chronic vascular disease and venous insufficiency Neuro: alert and oriented x 3, non-focal Pysch: normal affect  EKG Sinus bradycardia first-degree block, ST segment changes laterally, possible old inferior [place] infarct pattern, no change.  BMET    Component Value Date/Time   NA 135 12/28/2010 0530   K 4.7 12/28/2010 0530   CL 100 12/28/2010 0530   CO2 27 12/28/2010 0530   GLUCOSE 266* 12/28/2010 0530   BUN 30* 12/28/2010 0530   CREATININE 1.28 12/28/2010 0530   CALCIUM 9.4 12/28/2010 0530   GFRNONAA 56* 12/28/2010 0530   GFRAA 65* 12/28/2010 0530    Lipid Panel  No results found for this basename: chol, trig, hdl, cholhdl, vldl, ldlcalc    CBC    Component Value Date/Time   WBC 8.2 12/26/2010 2225   RBC 3.73* 12/26/2010 2225   HGB 11.4* 12/26/2010 2225   HCT 33.9* 12/26/2010 2225   PLT 224 12/26/2010 2225   MCV 90.9 12/26/2010 2225   MCH 30.6 12/26/2010 2225   MCHC 33.6 12/26/2010 2225   RDW 15.5 12/26/2010 2225   LYMPHSABS 1.2 12/25/2010 1353   MONOABS 0.7  12/25/2010 1353   EOSABS 0.0 12/25/2010 1353   BASOSABS 0.0 12/25/2010 1353      

## 2012-01-23 NOTE — Assessment & Plan Note (Signed)
Stable. He and his wife are very careful about this.

## 2012-01-23 NOTE — Assessment & Plan Note (Signed)
Stable chronic and pitting edema. I do not think he is volume overloaded.

## 2012-01-23 NOTE — Assessment & Plan Note (Signed)
This is probably multifactorial. As I told him and his wife today very honestly, he has the burden of chronic disease especially long-term complications from his diabetes. He also is overweight. It sounds like deconditioning as well as postural changes when he bends over. It could be ischemia though he has distal disease. He does have a history of mild aortic stenosis and will obtain followup echocardiogram to assess this. By exam onset has not changed much her no more than moderate.

## 2012-01-23 NOTE — Assessment & Plan Note (Signed)
With increased dyspnea we'll check 2-D echocardiogram to evaluate LV function and his aortic valve.

## 2012-01-24 ENCOUNTER — Telehealth: Payer: Self-pay | Admitting: *Deleted

## 2012-01-24 DIAGNOSIS — E785 Hyperlipidemia, unspecified: Secondary | ICD-10-CM

## 2012-01-24 MED ORDER — ROSUVASTATIN CALCIUM 20 MG PO TABS: 20.0000 mg | ORAL_TABLET | Freq: Every day | ORAL | Status: DC

## 2012-01-24 NOTE — Telephone Encounter (Signed)
Received incoming call from pharmacy rep Homero Fellers to clarify if it is ok to fill pt prilosec refill per currently taking Plavix and advised these two medications are not "good" to take together, please advise per recent OV with MD Wall 01-23-12  Called pt to clarify which cholesterol medication he is taking per noted in chart on Lipitor and on Crestor, the pt clarified he is on Crestor as well as frank with [facility] pharmacy noted pt last refill was for Crestor on 12-26-11, removed the Lipitor from the pt chart,  Placed refill in chart for pt Crestor via escribe

## 2012-01-24 NOTE — Telephone Encounter (Signed)
Change PPI to protonix 40 mg  from prevacid.

## 2012-01-25 ENCOUNTER — Ambulatory Visit (HOSPITAL_COMMUNITY)
Admission: RE | Admit: 2012-01-25 | Discharge: 2012-01-25 | Disposition: A | Discharge status: Home / Self Care | Payer: Medicare Other | Source: Ambulatory Visit | Attending: Cardiology | Admitting: Cardiology

## 2012-01-25 DIAGNOSIS — I6529 Occlusion and stenosis of unspecified carotid artery: Secondary | ICD-10-CM

## 2012-01-25 DIAGNOSIS — R609 Edema, unspecified: Secondary | ICD-10-CM | POA: Insufficient documentation

## 2012-01-25 DIAGNOSIS — I739 Peripheral vascular disease, unspecified: Secondary | ICD-10-CM

## 2012-01-25 DIAGNOSIS — G4730 Sleep apnea, unspecified: Secondary | ICD-10-CM

## 2012-01-25 DIAGNOSIS — E1149 Type 2 diabetes mellitus with other diabetic neurological complication: Secondary | ICD-10-CM

## 2012-01-25 DIAGNOSIS — I359 Nonrheumatic aortic valve disorder, unspecified: Secondary | ICD-10-CM | POA: Insufficient documentation

## 2012-01-25 DIAGNOSIS — R0609 Other forms of dyspnea: Secondary | ICD-10-CM | POA: Insufficient documentation

## 2012-01-25 DIAGNOSIS — R0989 Other specified symptoms and signs involving the circulatory and respiratory systems: Secondary | ICD-10-CM | POA: Insufficient documentation

## 2012-01-25 DIAGNOSIS — I25810 Atherosclerosis of coronary artery bypass graft(s) without angina pectoris: Secondary | ICD-10-CM

## 2012-01-25 DIAGNOSIS — I059 Rheumatic mitral valve disease, unspecified: Secondary | ICD-10-CM | POA: Insufficient documentation

## 2012-01-25 DIAGNOSIS — I379 Nonrheumatic pulmonary valve disorder, unspecified: Secondary | ICD-10-CM | POA: Insufficient documentation

## 2012-01-25 DIAGNOSIS — I10 Essential (primary) hypertension: Secondary | ICD-10-CM

## 2012-01-25 DIAGNOSIS — E669 Obesity, unspecified: Secondary | ICD-10-CM

## 2012-01-25 DIAGNOSIS — E785 Hyperlipidemia, unspecified: Secondary | ICD-10-CM

## 2012-01-25 DIAGNOSIS — I080 Rheumatic disorders of both mitral and aortic valves: Secondary | ICD-10-CM

## 2012-01-25 DIAGNOSIS — K449 Diaphragmatic hernia without obstruction or gangrene: Secondary | ICD-10-CM | POA: Insufficient documentation

## 2012-01-25 DIAGNOSIS — I2510 Atherosclerotic heart disease of native coronary artery without angina pectoris: Secondary | ICD-10-CM | POA: Insufficient documentation

## 2012-01-25 MED ORDER — PANTOPRAZOLE SODIUM 40 MG PO TBEC: 40.0000 mg | DELAYED_RELEASE_TABLET | Freq: Every day | ORAL | Status: DC

## 2012-01-25 NOTE — Progress Notes (Signed)
*  PRELIMINARY RESULTS* Echocardiogram 2D Echocardiogram has been performed.  [name] [name] [place] 01/25/2012, 11:20 AM

## 2012-01-25 NOTE — Telephone Encounter (Signed)
Called pt to advise about med change once clarified with KL about prilosec and not prevacid, pt understood change, made change in chart, called pharmacy and spoke to [facility] and gave the new prescription Protonix 40mg  QD, #30 with 5 refills also advised a Crestor refill had been sent in for the pt as well, amber advised receipt of refill for crestor,  rep will implement refill for protonix and pt will pick up today

## 2012-02-06 ENCOUNTER — Telehealth: Payer: Self-pay | Admitting: *Deleted

## 2012-02-06 ENCOUNTER — Ambulatory Visit: Payer: Medicare Other | Admitting: Cardiology

## 2012-02-06 NOTE — Telephone Encounter (Signed)
This nurse spoke to scheduling staff to receive update on pt apt for today, noted pt was out of town and MD [employer] assistant has been notified to follow up with pt about availability for appointment, will continue to follow up until pt schedules apt with MD Wall to address options from recent test results

## 2012-02-06 NOTE — Telephone Encounter (Signed)
While reviewing pt chart noted results in chart for pt Echo resulted on 02-04-12 with the following directions noted by MD Wall:  His Aortic stenosis has progressed. He also has Mitral stenosis. I need to see in office in [facility] on Weds to discuss options. Add to schedule.  Noted no apt made in chart for pt to come into office on Wednesday 02-06-12 at this time, this nurse contacted staff scheduler Sima Matas to advise pt needs apt today in [place] office with MD Wall to discuss options, TS will reach out to pt to schedule accordingly

## 2012-02-08 ENCOUNTER — Telehealth: Payer: Self-pay | Admitting: *Deleted

## 2012-02-08 ENCOUNTER — Encounter: Payer: Self-pay | Admitting: *Deleted

## 2012-02-08 DIAGNOSIS — I080 Rheumatic disorders of both mitral and aortic valves: Secondary | ICD-10-CM

## 2012-02-08 DIAGNOSIS — I2584 Coronary atherosclerosis due to calcified coronary lesion: Secondary | ICD-10-CM

## 2012-02-08 NOTE — Telephone Encounter (Signed)
I talked with pt today about his echocardiogram results and Dr. Vern Claude recommendations for a left and right heart cath with Dr. Excell Seltzer in the next 10 days. Pt is scheduled for cath in the [facility] lab on 02/14/12 at 9:30am. Pt will arrive for hydration at 0730 that morning Return to office for lab work on 02/12/12. He will pick up his letter of instructions at that time as well. Reassurance given to both pt and wife. Mylo Red RN

## 2012-02-12 ENCOUNTER — Other Ambulatory Visit (INDEPENDENT_AMBULATORY_CARE_PROVIDER_SITE_OTHER): Payer: Medicare Other

## 2012-02-12 DIAGNOSIS — I2584 Coronary atherosclerosis due to calcified coronary lesion: Secondary | ICD-10-CM

## 2012-02-12 DIAGNOSIS — I080 Rheumatic disorders of both mitral and aortic valves: Secondary | ICD-10-CM

## 2012-02-12 LAB — BASIC METABOLIC PANEL
CO2: 28 mEq/L (ref 19–32)
Chloride: 101 mEq/L (ref 96–112)
GFR: 54.23 mL/min — ABNORMAL LOW (ref >60.00)
Glucose, Bld: 159 mg/dL — ABNORMAL HIGH (ref 70–99)
Potassium: 3.9 mEq/L (ref 3.5–5.1)
Sodium: 137 mEq/L (ref 135–145)

## 2012-02-12 LAB — CBC WITH DIFFERENTIAL/PLATELET
Basophils Absolute: 0 10*3/uL (ref 0.0–0.1)
HCT: 38.4 % — ABNORMAL LOW (ref 39.0–52.0)
Hemoglobin: 12.3 g/dL — ABNORMAL LOW (ref 13.0–17.0)
Lymphs Abs: 1 10*3/uL (ref 0.7–4.0)
MCHC: 32.1 g/dL (ref 30.0–36.0)
Monocytes Relative: 8.1 % (ref 3.0–12.0)
Neutro Abs: 4.1 10*3/uL (ref 1.4–7.7)
RDW: 18.1 % — ABNORMAL HIGH (ref 11.5–14.6)

## 2012-02-14 ENCOUNTER — Inpatient Hospital Stay (HOSPITAL_BASED_OUTPATIENT_CLINIC_OR_DEPARTMENT_OTHER)
Admission: RE | Admit: 2012-02-14 | Discharge: 2012-02-14 | Disposition: A | Discharge status: Home / Self Care | Payer: Medicare Other | Source: Ambulatory Visit | Attending: Cardiovascular Disease | Admitting: Cardiovascular Disease

## 2012-02-14 ENCOUNTER — Encounter (HOSPITAL_BASED_OUTPATIENT_CLINIC_OR_DEPARTMENT_OTHER): Payer: Self-pay | Admitting: *Deleted

## 2012-02-14 ENCOUNTER — Encounter (HOSPITAL_BASED_OUTPATIENT_CLINIC_OR_DEPARTMENT_OTHER)
Admission: RE | Disposition: A | Discharge status: Home / Self Care | Payer: Self-pay | Source: Ambulatory Visit | Attending: Cardiovascular Disease

## 2012-02-14 DIAGNOSIS — I2789 Other specified pulmonary heart diseases: Secondary | ICD-10-CM | POA: Insufficient documentation

## 2012-02-14 DIAGNOSIS — I059 Rheumatic mitral valve disease, unspecified: Secondary | ICD-10-CM | POA: Insufficient documentation

## 2012-02-14 DIAGNOSIS — Z951 Presence of aortocoronary bypass graft: Secondary | ICD-10-CM | POA: Insufficient documentation

## 2012-02-14 DIAGNOSIS — I359 Nonrheumatic aortic valve disorder, unspecified: Secondary | ICD-10-CM | POA: Insufficient documentation

## 2012-02-14 DIAGNOSIS — I2510 Atherosclerotic heart disease of native coronary artery without angina pectoris: Secondary | ICD-10-CM

## 2012-02-14 LAB — POCT I-STAT 3, VENOUS BLOOD GAS (G3P V)
Bicarbonate: 24.9 mEq/L — ABNORMAL HIGH (ref 20.0–24.0)
O2 Saturation: 53 %
O2 Saturation: 61 %
TCO2: 26 mmol/L (ref 0–100)
TCO2: 28 mmol/L (ref 0–100)
pCO2, Ven: 48.9 mmHg (ref 45.0–50.0)
pCO2, Ven: 52.1 mmHg — ABNORMAL HIGH (ref 45.0–50.0)
pCO2, Ven: 52.2 mmHg — ABNORMAL HIGH (ref 45.0–50.0)
pO2, Ven: 31 mmHg (ref 30.0–45.0)
pO2, Ven: 32 mmHg (ref 30.0–45.0)

## 2012-02-14 LAB — POCT I-STAT 3, ART BLOOD GAS (G3+): Acid-base deficit: 2 mmol/L (ref 0.0–2.0)

## 2012-02-14 SURGERY — JV LEFT AND RIGHT HEART CATHETERIZATION WITH CORONARY/GRAFT ANGIOGRAM
Anesthesia: Moderate Sedation

## 2012-02-14 MED ORDER — DIAZEPAM 5 MG PO TABS
5.0000 mg | ORAL_TABLET | Freq: Once | ORAL | Status: AC
  Administered 2012-02-14: 5 mg via ORAL

## 2012-02-14 MED ORDER — SODIUM CHLORIDE 0.9 % IV SOLN: 1.0000 mL/kg/h | INTRAVENOUS | Status: DC

## 2012-02-14 MED ORDER — MORPHINE SULFATE 2 MG/ML IJ SOLN: 2.0000 mg | INTRAMUSCULAR | Status: DC | PRN

## 2012-02-14 MED ORDER — ONDANSETRON HCL 4 MG/2ML IJ SOLN: 4.0000 mg | Freq: Four times a day (QID) | INTRAMUSCULAR | Status: DC | PRN

## 2012-02-14 MED ORDER — OXYCODONE-ACETAMINOPHEN 5-325 MG PO TABS: 1.0000 | ORAL_TABLET | ORAL | Status: DC | PRN

## 2012-02-14 MED ORDER — ACETAMINOPHEN 325 MG PO TABS: 650.0000 mg | ORAL_TABLET | ORAL | Status: DC | PRN

## 2012-02-14 NOTE — Telephone Encounter (Signed)
Researched pt chart and noted the following phone note from 02-08-12:  Lisabeth Devoid, RN 02/08/2012 5:19 PM Signed  I talked with pt today about his echocardiogram results and Dr. Vern Claude recommendations for a left and right heart cath with Dr. Excell Seltzer in the next 10 days.  Pt is scheduled for cath in the [facility] lab on 02/14/12 at 9:30am. Pt will arrive for hydration at 0730 that morning  Return to office for lab work on 02/12/12. He will pick up his letter of instructions at that time as well.  Reassurance given to both pt and wife.  Mylo Red RN    Thus MD Wall concern has been addressed with pt and pt will attend upcoming apts noted in chart

## 2012-02-14 NOTE — Interval H&P Note (Signed)
History and Physical Interval Note:  02/14/2012 10:32 AM  [name] [name]  has presented today for surgery, with the diagnosis of Aortic Stenosis  The various methods of treatment have been discussed with the patient and family. After consideration of risks, benefits and other options for treatment, the patient has consented to  Procedure(s) (LRB) with comments: [facility] LEFT AND RIGHT HEART CATHETERIZATION WITH CORONARY/GRAFT ANGIOGRAM (N/A) as a surgical intervention .  The patient's history has been reviewed, patient examined, no change in status, stable for surgery.  I have reviewed the patient's chart and labs.  Questions were answered to the patient's satisfaction.     [name]

## 2012-02-14 NOTE — OR Nursing (Signed)
Dr Cooper at bedside to discuss results and treatment plan with pt and family 

## 2012-02-14 NOTE — CV Procedure (Signed)
Cardiac Catheterization Procedure Note  Name: [name] [name] MRN: [id number] DOB: 14-Nov-1940  Procedure: Right Heart Cath, Left Heart Cath, Selective Coronary Angiography, LV angiography  Indication: Congestive heart failure, and shortness of breath, valvular heart disease. This 71 year old gentleman with prior coronary bypass surgery has developed progressive shortness of breath. A recent echocardiogram has demonstrated moderately severe aortic stenosis and moderately severe mitral stenosis. He presents today for right and left heart catheterization for full hemodynamic and angiographic assessment.   Procedural Details: The right groin was prepped, draped, and anesthetized with 1% lidocaine. Using the modified Seldinger technique a 6 French sheath was placed in the right femoral artery and a 6 French sheath was placed in the right femoral vein. A multipurpose catheter was used for the right heart catheterization. Standard protocol was followed for recording of right heart pressures and sampling of oxygen saturations. Fick cardiac output was calculated. Standard Judkins catheters were used for selective coronary angiography and left ventriculography. The JR 4 catheter was used to image the saphenous vein graft obtuse marginal. That same catheter was used to engage the left subclavian artery but I could not selectively engage the LIMA. This was changed out for a LIMA catheter over an exchange length wire. An AL-1 catheter was used to direct a straight tip wire across the aortic valve with a moderate amount of difficulty this was achieved. This was changed out over an exchange length J-wire for a Langston pigtail catheter so that simultaneous LV and aortic pressures could be recorded. A Perclose device was used for femoral artery hemostasis. There were no immediate procedural complications. The patient was transferred to the post catheterization recovery area for further monitoring.  Procedural  Findings: Hemodynamics RA mean of 16 RV 94/22 PA 91/22 with a mean of 50 PCWP A wave 30, V wave 33, mean 24 LV 171/32 AO 134/63 with a mean of 89  Oxygen saturations: PA 53, 55 with a mean of 54 AO 93 SVC 61  Cardiac Output (Fick) 4.5 L per minute  Cardiac Index (Fick) 1.9 L per minute per meter squared  Aortic valve hemodynamics: Mean gradient 41, valve area 0.9 cm, valve area index 0.39 cm per meter square   Coronary angiography: Coronary dominance: right  Left mainstem: The left mainstem is heavily calcified. The left main is diffusely diseased with 50% stenosis leading into a 90% distal left mainstem stenosis.  Left anterior descending (LAD): The LAD has diffuse heavy calcification and it is totally occluded after the second septal perforator branch.  Left circumflex (LCx): The left circumflex is totally occluded at its origin.  Right coronary artery (RCA): The RCA is severely calcified throughout its course. The mid vessel has a critical 95% stenosis present. This is an area of very severe calcification. There is diffuse disease throughout the right coronary artery distribution. The PDA and posterolateral branches are patent.  Saphenous vein graft to OM: This graft is patent and supplies both the first and second obtuse marginal branches. There is no significant disease noted.  LIMA to LAD: The LIMA graft is widely patent without significant stenosis. However, the native LAD is critically disease throughout its course and it is a very small vessel not suitable for any further revascularization. There is diffuse 95% stenosis throughout. The proximal LAD retrograde fills from the graft.  Left ventriculography: Left ventricular systolic function is normal, LVEF is estimated at 55-65%, there is no significant mitral regurgitation. There is heavy calcification of the mitral annulus and moderate  calcification of the aortic valve.  Final Conclusions:   1. Severe native  three-vessel coronary artery disease 2. Status post aortocoronary bypass surgery with continued patency of the LIMA to LAD and saphenous vein graft to obtuse marginal 3. Interval development of severe native LAD disease he on the LIMA anastomotic site and severe native right coronary artery stenosis 4. Preserved left ventricular systolic function 5. Severe aortic stenosis 6. Severely calcified mitral annulus with known moderate to severe mitral stenosis 7. Severe pulmonary hypertension  Recommendations: This patient has a very complex cardiac disease. He's had prior bypass surgery. I'm going to review his case with his primary physician, Dr. Daleen Squibb. Potential treatment options include: 1) conservative approach considering the extent of his disease and comorbidities versus 2) treatment with PCI of the right coronary artery and consideration of TAVR versus 3) definitive surgical treatment of both his aortic and mitral valve disease as well as bypass of his right coronary artery. Will likely obtain a surgical consult for further discussion.  [name] 02/14/2012, 10:34 AM

## 2012-02-14 NOTE — OR Nursing (Signed)
Tegaderm dressing applied, site level 0, bedrest begins at 1040 

## 2012-02-14 NOTE — OR Nursing (Signed)
Discharge instructions reviewed and signed, pt stated understanding, ambulated in hall without difficulty, site level 0, transported to wife's car via wheelchair 

## 2012-02-14 NOTE — OR Nursing (Signed)
Meal served 

## 2012-02-14 NOTE — H&P (View-Only) (Signed)
HPI [name] [name] comes in today with his wife for further evaluation and management of his complex cardiovascular and chronic disease history.  His biggest complaint is getting short of breath when he bends over or does some sort of quick activity. He denies any chest pain or chest discomfort with this. He denies orthopnea, PND and his edema has been stable. His legs have been stable without any ulcerations or cellulitis. He is graduated as he puts it from the wound Center.  He just feels chronically ill and does not have much energy. A 3 Hour Rd. trip wears him out.  He denies any presyncope or syncope. Last echocardiogram evaluating his mild aortic stenosis was about 3 years ago.  Past Medical History  Diagnosis Date  . GERD (gastroesophageal reflux disease)   . Hyperlipidemia   . Hypertension   . Diabetes mellitus   . Sleep apnea   . Carotid artery disease   . CAD (coronary artery disease), bypass graft transplanted heart   . Cellulitis and abscess of leg, except foot   . Leg edema   . Right knee sprain   . Cramps, muscle, general   . Low back pain   . Hiatal hernia   . Superficial foreign body of finger without major open wound and without infection   . Chills   . Hearing loss   . Nasal congestion   . Cough   . Wheezing   . Generalized headaches     Current Outpatient Prescriptions  Medication Sig Dispense Refill  . aspirin 81 MG tablet Take 81 mg by mouth daily.        Marland Kitchen atorvastatin (LIPITOR) 40 MG tablet Take 40 mg by mouth daily.        . clopidogrel (PLAVIX) 75 MG tablet Take 1 tablet (75 mg total) by mouth daily.  30 tablet  12  . co-enzyme Q-10 30 MG capsule Take 30 mg by mouth daily.        Marland Kitchen ezetimibe (ZETIA) 10 MG tablet Take 10 mg by mouth daily.        . fenofibrate micronized (ANTARA) 130 MG capsule Take 130 mg by mouth daily before breakfast.        . fish oil-omega-3 fatty acids 1000 MG capsule Take 2 g by mouth 2 (two) times daily.        . furosemide  (LASIX) 80 MG tablet Take 1 tablet (80 mg total) by mouth daily.  30 tablet  9  . insulin glargine (LANTUS) 100 UNIT/ML injection Inject 90 Units into the skin 3 (three) times daily.        . metFORMIN (GLUCOPHAGE) 1000 MG tablet Take 1,000 mg by mouth 2 (two) times daily with a meal.        . metoprolol (LOPRESSOR) 50 MG tablet Take 1 tablet (50 mg total) by mouth 2 (two) times daily.  60 tablet  6  . nitroGLYCERIN (NITROSTAT) 0.4 MG SL tablet Place 1 tablet (0.4 mg total) under the tongue every 5 (five) minutes as needed for chest pain. May repeat up to 3 times as needed  100 tablet  3  . omeprazole (PRILOSEC) 20 MG capsule Take 20 mg by mouth daily.        . ramipril (ALTACE) 10 MG tablet Take 1 tablet (10 mg total) by mouth daily.  30 tablet  3  . ranitidine (ZANTAC) 75 MG tablet Take 75 mg by mouth 2 (two) times daily.        Marland Kitchen  repaglinide (PRANDIN) 2 MG tablet Take 2 mg by mouth 3 (three) times daily before meals.        . rosuvastatin (CRESTOR) 20 MG tablet Take 1 tablet (20 mg total) by mouth at bedtime.  30 tablet  11  . sertraline (ZOLOFT) 50 MG tablet Take 1 tablet (50 mg total) by mouth daily.  90 tablet  3  . Vitamin D, Ergocalciferol, (DRISDOL) 50000 UNITS CAPS Take 50,000 Units by mouth every 7 (seven) days.        . [DISCONTINUED] potassium chloride (KLOR-CON) 20 MEQ packet Take 20 mEq by mouth daily.        . [DISCONTINUED] promethazine (PHENERGAN) 25 MG tablet Take 1/2 to 1 tablet up to every 6 hours as needed for nausea.         Allergies  Allergen Reactions  . Celecoxib     Family History  Problem Relation Age of Onset  . Diabetes Other     History   Social History  . Marital Status: Married    Spouse Name: N/A    Number of Children: N/A  . Years of Education: N/A   Occupational History  . Not on file.   Social History Main Topics  . Smoking status: Never Smoker   . Smokeless tobacco: Never Used  . Alcohol Use: No  . Drug Use: No  . Sexually Active: Not  on file   Other Topics Concern  . Not on file   Social History Narrative  . No narrative on file    ROS ALL NEGATIVE EXCEPT THOSE NOTED IN HPI  PE  General Appearance: well developed, well nourished in no acute distress, obese, looks chronically ill skin color better than usual HEENT: symmetrical face, PERRLA, good dentition  Neck: no JVD, thyromegaly, or adenopathy, trachea midline Chest: symmetric without deformity Cardiac: PMI non-displaced, RRR, normal S1, S2, no gallop, aortic stenosis murmur, S2 does split. Lung: clear to ausculation and percussion Vascular: Diminished pulses in lower extremities, no ulcerations, bilateral carotid bruits Abdominal: nondistended, nontender, good bowel sounds, no HSM, no bruits Extremities: no cyanosis, clubbing , no sign of DVT, no varicosities, chronic edematous changes with 1-2+ pitting edema pretibially  Skin: normal color, no rashes, leg discoloration from chronic vascular disease and venous insufficiency Neuro: alert and oriented x 3, non-focal Pysch: normal affect  EKG Sinus bradycardia first-degree block, ST segment changes laterally, possible old inferior [place] infarct pattern, no change.  BMET    Component Value Date/Time   NA 135 12/28/2010 0530   K 4.7 12/28/2010 0530   CL 100 12/28/2010 0530   CO2 27 12/28/2010 0530   GLUCOSE 266* 12/28/2010 0530   BUN 30* 12/28/2010 0530   CREATININE 1.28 12/28/2010 0530   CALCIUM 9.4 12/28/2010 0530   GFRNONAA 56* 12/28/2010 0530   GFRAA 65* 12/28/2010 0530    Lipid Panel  No results found for this basename: chol, trig, hdl, cholhdl, vldl, ldlcalc    CBC    Component Value Date/Time   WBC 8.2 12/26/2010 2225   RBC 3.73* 12/26/2010 2225   HGB 11.4* 12/26/2010 2225   HCT 33.9* 12/26/2010 2225   PLT 224 12/26/2010 2225   MCV 90.9 12/26/2010 2225   MCH 30.6 12/26/2010 2225   MCHC 33.6 12/26/2010 2225   RDW 15.5 12/26/2010 2225   LYMPHSABS 1.2 12/25/2010 1353   MONOABS 0.7  12/25/2010 1353   EOSABS 0.0 12/25/2010 1353   BASOSABS 0.0 12/25/2010 1353

## 2012-02-18 ENCOUNTER — Encounter: Payer: Self-pay | Admitting: Family Medicine

## 2012-02-18 ENCOUNTER — Ambulatory Visit (INDEPENDENT_AMBULATORY_CARE_PROVIDER_SITE_OTHER): Payer: Medicare Other | Admitting: Family Medicine

## 2012-02-18 VITALS — BP 104/40 | HR 60 | Temp 98.2°F | Wt 250.8 lb

## 2012-02-18 DIAGNOSIS — J069 Acute upper respiratory infection, unspecified: Secondary | ICD-10-CM

## 2012-02-18 DIAGNOSIS — I10 Essential (primary) hypertension: Secondary | ICD-10-CM

## 2012-02-18 LAB — BASIC METABOLIC PANEL
BUN: 26 mg/dL — ABNORMAL HIGH (ref 6–23)
CO2: 26 mEq/L (ref 19–32)
GFR: 52.47 mL/min — ABNORMAL LOW (ref >60.00)
Glucose, Bld: 210 mg/dL — ABNORMAL HIGH (ref 70–99)
Potassium: 4.4 mEq/L (ref 3.5–5.1)

## 2012-02-18 NOTE — Patient Instructions (Addendum)
Go to the lab on the way out.  We'll contact you with your lab report. Take plain claritin 10mg  a day for the runny nose.  If you have a fever or get lightheaded, then notify us.

## 2012-02-18 NOTE — Assessment & Plan Note (Addendum)
His lungs are clear and he has sick contacts.  This is likely viral.  Recheck of pressure was 116/68.  He isn't orthostatic. Given the recent dye load with cath, will check BMET.  O/w supportive care for now and I'll await cards input.  He agrees with plan.  Local care for skin on R shin

## 2012-02-18 NOTE — Progress Notes (Addendum)
Recently with cath done and awaiting cardiac input.  No CP but fatigued with exertion.  BLE edema continues.  Recently with blistering on R medial shin, now unroofed and bandaged.   Recently with sugar controlled, now with cold symptoms.  Rhinorrhea but no fever.  Cough but sputum isn't colored.  Going on for 3 days.  Sick contacts.   Meds, vitals, and allergies reviewed.   ROS: See HPI.  Otherwise, noncontributory.  GEN: nad, alert and oriented HEENT: mucous membranes moist, tm w/o erythema, nasal exam w/o erythema, clear discharge noted,  OP with cobblestoning NECK: supple w/o LA CV: rrr.  Murmur noted PULM: ctab, no inc wob, no focal dec in BS EXT: 1-2+ edema with chronic changes noted, blister on R shin bandaged.   Recheck BP 116/68

## 2012-02-20 ENCOUNTER — Other Ambulatory Visit: Payer: Self-pay | Admitting: *Deleted

## 2012-02-20 DIAGNOSIS — I359 Nonrheumatic aortic valve disorder, unspecified: Secondary | ICD-10-CM

## 2012-02-22 ENCOUNTER — Telehealth: Payer: Self-pay | Admitting: *Deleted

## 2012-02-22 NOTE — Telephone Encounter (Signed)
Spoke with patient to give him TAVR appt on Monday 03/10/12.  Answered all questions.  Told to call if any other concerns came up.

## 2012-02-25 ENCOUNTER — Other Ambulatory Visit: Payer: Self-pay | Admitting: *Deleted

## 2012-02-25 MED ORDER — SERTRALINE HCL 50 MG PO TABS: 50.0000 mg | ORAL_TABLET | Freq: Every day | ORAL | Status: DC

## 2012-03-10 ENCOUNTER — Inpatient Hospital Stay (HOSPITAL_COMMUNITY)
Admission: RE | Admit: 2012-03-10 | Discharge: 2012-03-10 | Disposition: A | Discharge status: Home / Self Care | Payer: Medicare Other | Source: Ambulatory Visit

## 2012-03-10 ENCOUNTER — Encounter: Payer: Self-pay | Admitting: Thoracic Surgery (Cardiothoracic Vascular Surgery)

## 2012-03-10 ENCOUNTER — Ambulatory Visit (HOSPITAL_COMMUNITY)
Admission: RE | Admit: 2012-03-10 | Discharge: 2012-03-10 | Disposition: A | Discharge status: Home / Self Care | Payer: Medicare Other | Source: Ambulatory Visit | Attending: Thoracic Surgery (Cardiothoracic Vascular Surgery) | Admitting: Thoracic Surgery (Cardiothoracic Vascular Surgery)

## 2012-03-10 ENCOUNTER — Institutional Professional Consult (permissible substitution) (INDEPENDENT_AMBULATORY_CARE_PROVIDER_SITE_OTHER): Payer: Medicare Other | Admitting: Thoracic Surgery (Cardiothoracic Vascular Surgery)

## 2012-03-10 VITALS — BP 138/70 | HR 62 | Resp 20 | Ht 68.0 in | Wt 242.0 lb

## 2012-03-10 DIAGNOSIS — I2789 Other specified pulmonary heart diseases: Secondary | ICD-10-CM

## 2012-03-10 DIAGNOSIS — Z951 Presence of aortocoronary bypass graft: Secondary | ICD-10-CM

## 2012-03-10 DIAGNOSIS — I080 Rheumatic disorders of both mitral and aortic valves: Secondary | ICD-10-CM

## 2012-03-10 DIAGNOSIS — I050 Rheumatic mitral stenosis: Secondary | ICD-10-CM

## 2012-03-10 DIAGNOSIS — I359 Nonrheumatic aortic valve disorder, unspecified: Secondary | ICD-10-CM

## 2012-03-10 DIAGNOSIS — I5032 Chronic diastolic (congestive) heart failure: Secondary | ICD-10-CM

## 2012-03-10 DIAGNOSIS — I272 Pulmonary hypertension, unspecified: Secondary | ICD-10-CM

## 2012-03-10 DIAGNOSIS — I350 Nonrheumatic aortic (valve) stenosis: Secondary | ICD-10-CM

## 2012-03-10 DIAGNOSIS — I872 Venous insufficiency (chronic) (peripheral): Secondary | ICD-10-CM

## 2012-03-10 DIAGNOSIS — I509 Heart failure, unspecified: Secondary | ICD-10-CM

## 2012-03-10 LAB — PULMONARY FUNCTION TEST

## 2012-03-10 MED ORDER — ALBUTEROL SULFATE (5 MG/ML) 0.5% IN NEBU
2.5000 mg | INHALATION_SOLUTION | Freq: Once | RESPIRATORY_TRACT | Status: AC
  Administered 2012-03-10: 2.5 mg via RESPIRATORY_TRACT

## 2012-03-10 NOTE — Progress Notes (Signed)
                 [address]            [name] [address]          [phone]     CARDIOTHORACIC SURGERY CONSULTATION REPORT  Referring Provider is Wall, Thomas C, MD PCP is [name] Duncan, MD  Chief Complaint  Patient presents with  . Aortic Stenosis  . Mitral Stenosis  . Coronary Artery Disease    HPI:  Patient is a 71-year-old obese white male with long-standing history of coronary artery disease, hypertension, type 2 diabetes mellitus, chronic venous insufficiency with bilateral lower extremity ulcerations, and obstructive sleep apnea we'll also has been recently found to have severe aortic stenosis and severe mitral regurgitation with worsening symptoms of chronic diastolic congestive heart failure. The patient's cardiac history dates back more than 15 years ago when he first presented with angina pectoris. He underwent an attempted percutaneous coronary intervention but subsequently underwent coronary artery bypass grafting x6 by Dr. Wilson in 1998 for severe three-vessel coronary artery disease. Grafts placed at the time of surgery included left internal mammary artery placed sequentially to the diagonal and the distal left anterior descending coronary artery, saphenous vein graft placed sequentially to the first and second obtuse marginal branch of left circumflex coronary artery, and saphenous vein graft placed sequentially to the posterior descending coronary artery and the right posterolateral branch. The patient initially did well but apparently 2 years later underwent PCI and stenting.  The patient reports that he continued to do fairly well for many years.  Over the past year or 2 he has had continued to slow down physically do to the development of gradual progression of exertional shortness of breath and chest tightness as well as increasing problems with his vision do to severe diabetic retinopathy with recurrent hemorrhages in his right eye. Despite this he  he has remained reasonably active physically until the last several months, during which time he has developed further significant progression of exertional shortness of breath and chest tightness. He states that he now gets short of breath with a mild tight feeling across his chest with relatively mild activity. This never occurs at rest symptoms are usually promptly relieved within a few minutes of rest. He has never taken any nitroglycerin.  He denies any history of PND. He has chronic orthopnea and chronic severe bilateral lower extremity edema. He has not had any tachypalpitations, dizzy spells, nor syncope.  He reports that he gets fatigued quite easily.  The patient was seen in followup by Dr. Wall in November. An echocardiogram was performed demonstrating moderate to severe aortic stenosis and moderate to severe mitral stenosis with preserved left ventricular systolic function. The patient was referred to Dr. Cooper who performed left and right heart catheterization. This confirmed the presence of severe aortic stenosis with severe native coronary artery disease and severe vein graft disease. There was severe pulmonary hypertension. The patient has now been referred to consider high-risk surgical intervention.  Past Medical History  Diagnosis Date  . GERD (gastroesophageal reflux disease)   . Hyperlipidemia   . Hypertension   . Diabetes mellitus   . Sleep apnea   . Carotid artery disease   . Cellulitis and abscess of leg, except foot   . Leg edema   . Right knee sprain   . Cramps, muscle, general   . Low back pain   . Hiatal hernia   .   Chills   . Hearing loss   . Nasal congestion   . Cough   . Wheezing   . Generalized headaches   . 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 Mitral stenosis 03/10/2012  . Chronic diastolic congestive heart failure 03/10/2012  . Aortic stenosis 03/10/2012    . Pulmonary hypertension 03/10/2012  . Venous insufficiency (chronic) (peripheral) 03/10/2012  . CAD, ARTERY BYPASS GRAFT 06/24/2008    Qualifier: Diagnosis of  By: Frazier, RMA, Sherri      Past Surgical History  Procedure Date  . Ett cardiolite 12/31/2006    Mild-Mod distal inf/apical ischemia  . Open heart surgery   . Acute or chronic diastolic hf 8/6/ - 10/11/2008    RLE cellulitis - HOSP  . Doppler echocardiography 10/11/2008    Mild AS, Mild-Mod MS, Severe LVH  . Le arterial and venous us 10/11/2008    Art clear.  Venous Neg DVT  . Appendectomy 12/25/2010    Dr [name] [name]  . Umbilical hernia repair 12/25/2010    Dr [name] [name]  . Hernia repair 12/25/10    umbilical hernia repair     Family History  Problem Relation Age of Onset  . Diabetes Other     History   Social History  . Marital Status: Married    Spouse Name: N/A    Number of Children: N/A  . Years of Education: N/A   Occupational History  . Not on file.   Social History Main Topics  . Smoking status: Never Smoker   . Smokeless tobacco: Never Used  . Alcohol Use: No  . Drug Use: No  . Sexually Active: Not on file   Other Topics Concern  . Not on file   Social History Narrative  . No narrative on file    Current Outpatient Prescriptions  Medication Sig Dispense Refill  . aspirin 81 MG tablet Take 81 mg by mouth daily.        . clopidogrel (PLAVIX) 75 MG tablet Take 1 tablet (75 mg total) by mouth daily.  30 tablet  5  . co-enzyme Q-10 30 MG capsule Take 30 mg by mouth daily.        . ezetimibe (ZETIA) 10 MG tablet Take 1 tablet (10 mg total) by mouth daily.  30 tablet  5  . fenofibrate micronized (ANTARA) 130 MG capsule Take 1 capsule (130 mg total) by mouth daily before breakfast.  30 capsule  5  . fish oil-omega-3 fatty acids 1000 MG capsule Take 2 g by mouth 2 (two) times daily.        . furosemide (LASIX) 80 MG tablet Take 1 tablet (80 mg total) by mouth daily.  30 tablet  5  . insulin glargine (LANTUS)  100 UNIT/ML injection Inject 90 Units into the skin 3 (three) times daily.        . JANUVIA 100 MG tablet       . LOVAZA 1 G capsule       . metFORMIN (GLUCOPHAGE) 1000 MG tablet Take 1,000 mg by mouth 2 (two) times daily with a meal.        . metoprolol (LOPRESSOR) 50 MG tablet Take 1 tablet (50 mg total) by mouth 2 (two) times daily.  60 tablet  5  . nitroGLYCERIN (NITROSTAT) 0.4 MG SL tablet Place 1 tablet (0.4 mg total)   under the tongue every 5 (five) minutes as needed for chest pain. May repeat up to 3 times as needed  30 tablet  5  . omeprazole (PRILOSEC) 20 MG capsule       . pantoprazole (PROTONIX) 40 MG tablet Take 1 tablet (40 mg total) by mouth daily.  30 tablet  5  . ramipril (ALTACE) 10 MG tablet Take 1 tablet (10 mg total) by mouth daily.  30 tablet  5  . ranitidine (ZANTAC) 75 MG tablet Take 75 mg by mouth 2 (two) times daily.        . repaglinide (PRANDIN) 2 MG tablet Take 2 mg by mouth 3 (three) times daily before meals.        . rosuvastatin (CRESTOR) 20 MG tablet Take 1 tablet (20 mg total) by mouth at bedtime.  30 tablet  6  . sertraline (ZOLOFT) 50 MG tablet Take 1 tablet (50 mg total) by mouth daily.  90 tablet  0  . Vitamin D, Ergocalciferol, (DRISDOL) 50000 UNITS CAPS Take 50,000 Units by mouth every 7 (seven) days.        . [DISCONTINUED] potassium chloride (KLOR-CON) 20 MEQ packet Take 20 mEq by mouth daily.        . [DISCONTINUED] promethazine (PHENERGAN) 25 MG tablet Take 1/2 to 1 tablet up to every 6 hours as needed for nausea.         Allergies  Allergen Reactions  . Celecoxib        Review of Systems:   General:  decreased appetite, decreased energy, no weight gain, no weight loss, no fever  Cardiac:  + chest pain with exertion, no chest pain at rest, + SOB with mild exertion, no resting SOB, no PND, + orthopnea, no palpitations, no arrhythmia, no atrial fibrillation, + severe LE edema, no dizzy spells, no syncope  Respiratory:  + shortness of breath, no  home oxygen, no productive cough, no dry cough, no bronchitis, no wheezing, no hemoptysis, no asthma, no pain with inspiration or cough, + sleep apnea, does not use CPAP at night  GI:   no difficulty swallowing, + reflux, no frequent heartburn, no hiatal hernia, no abdominal pain, no constipation, no diarrhea, no hematochezia, no hematemesis, no melena  GU:   no dysuria,  no frequency, no urinary tract infection, no hematuria, no enlarged prostate, no kidney stones, mild kidney disease last creatinine 1.4  Vascular:  no pain suggestive of claudication, no pain in feet, no leg cramps, no varicose veins, no DVT, + non-healing ulcerations both lower legs  Neuro:   no stroke, no TIA's, no seizures, + headaches, + temporary blindness right eye,  no slurred speech, + peripheral neuropathy, no chronic pain, no instability of gait, no memory/cognitive dysfunction  Musculoskeletal: no arthritis, no joint swelling, no myalgias, mild difficulty walking, mildly limited mobility   Skin:   + chronic rash both lower legs, no itching, + chronic skin infections, + chronic pressure sores or ulcerations  Psych:   no anxiety, no depression, no nervousness, + unusual recent stress  Eyes:   + blurry vision, + floaters, + recent vision changes, does not wear glasses or contacts  ENT:   + hearing loss, no loose or painful teeth, no dentures, last saw dentist 2013  Hematologic:  no easy bruising, + abnormal bleeding, no clotting disorder, no frequent epistaxis  Endocrine:  + diabetes, checks CBG's at home, last Hgb A1C reported 7.5     Physical Exam:   BP 138/70    Pulse 62  Resp 20  Ht 5' 8" (1.727 m)  Wt 242 lb (109.77 kg)  BMI 36.80 kg/m2  SpO2 90%  General:  Obese but otherwise fairly  well-appearing  HEENT:  Unremarkable   Neck:   no JVD, no bruits, no adenopathy   Chest:   Few bibasilar inspiratory crackles, symmetrical breath sounds, no wheezes, no rhonchi   CV:   RRR, grade III/VI systolic murmur best  LSB  Abdomen:  soft, non-tender, no masses, no obvious ascites  Extremities:  warm, well-perfused, pulses not palpable, + severe bilateral LE edema, chronic skin changes from venous insufficiency and diabetes both lower legs with a few areas of skin breakdown but no active cellulitis  Rectal/GU  Deferred  Neuro:   Grossly non-focal and symmetrical throughout  Skin:   Clean and dry other than lower legs, no rashes   Diagnostic Tests:   Transthoracic Echocardiography  Patient: Strong [name], Sevon A MR #: [id number] Study Date: 01/25/2012 Gender: M Age: 71 Height: 177.8cm Weight: 115.2kg BSA: 2.31m^2 Pt. Status: Room:  SONOGRAPHER Linford, Irene ATTENDING Thomas Wall, MD ORDERING Thomas Wall, MD REFERRING Thomas Wall, MD PERFORMING Harrington, Eddington cc:  ------------------------------------------------------------ LV EF: 65% - 70%  ------------------------------------------------------------ Indications: Aortic stenosis 424.1.  ------------------------------------------------------------ History: PMH: Dyspnea. Coronary artery disease. Aortic valve disease. Mitral valve disease. PMH: Hiatal hernia, stents, edema  ------------------------------------------------------------ Study Conclusions  - Left ventricle: The cavity size was normal. Wall thickness was increased in a pattern of moderate LVH. There was severe asymmetric hypertrophy of the septum. Systolic function was vigorous. The estimated ejection fraction was in the range of 65% to 70%. The study is not technically sufficient to allow evaluation of LV diastolic function. - Aortic valve: Trileaflet; moderately thickened, moderately calcified leaflets. Cusp separation was moderately reduced. There was moderate to severe stenosis - more in moderate range by planimetry. No significant regurgitation. Mean gradient: 36mm Hg (S). Valve area: 1.18cm^2(VTI). Peak velocity ratio of LVOT to aortic valve: 0.33. - Mitral  valve: Moderately calcified annulus. Severely thickened, moderately calcified leaflets . Mobility was severely restricted. The findings are consistent with moderate to severe stenosis. Trivial regurgitation. Mean gradient: 13mm Hg (D). Valve area by pressure half-time: 1.47cm^2. Valve area by continuity equation (using LVOT flow): 0.92cm^2. - Left atrium: The atrium was moderately dilated. - Right ventricle: The cavity size was moderately dilated. Systolic function was moderately reduced. - Right atrium: The atrium was mildly dilated. Central venous pressure: 15mm Hg (est). - Pulmonic valve: Trivial regurgitation. - Pericardium, extracardiac: There was no pericardial effusion.  ------------------------------------------------------------ Labs, prior tests, procedures, and surgery: Coronary artery bypass grafting.  Transthoracic echocardiography. M-mode, complete 2D, spectral Doppler, and color Doppler. Height: Height: 177.8cm. Height: 70in. Weight: Weight: 115.2kg. Weight: 253.5lb. Body mass index: BMI: 36.4kg/m^2. Body surface area: BSA: 2.31m^2. Patient status: Outpatient. Location: Echo laboratory.  ------------------------------------------------------------  ------------------------------------------------------------ Left ventricle: The cavity size was normal. Wall thickness was increased in a pattern of moderate LVH. There was severe asymmetric hypertrophy of the septum. Systolic function was vigorous. The estimated ejection fraction was in the range of 65% to 70%. The study is not technically sufficient to allow evaluation of LV diastolic function.  ------------------------------------------------------------ Aortic valve: Trileaflet; moderately thickened, moderately calcified leaflets. Cusp separation was moderately reduced. Doppler: There was moderate to severe stenosis - more in moderate range by planimetry. No significant regurgitation. VTI ratio of LVOT to  aortic valve: 0.37. Valve area: 1.18cm^2(VTI). Indexed valve area: 0.51cm^2/m^2 (VTI). Peak velocity ratio of LVOT to   aortic valve: 0.33. Valve area: 1.05cm^2 (Vmax). Indexed valve area: 0.45cm^2/m^2 (Vmax). Mean gradient: 36mm Hg (S). Peak gradient: 54mm Hg (S).  ------------------------------------------------------------ Aorta: Aortic root: The aortic root was mildly ectatic.  ------------------------------------------------------------ Mitral valve: Moderately calcified annulus. Severely thickened, moderately calcified leaflets . Mobility was severely restricted. Doppler: The findings are consistent with moderate to severe stenosis. Trivial regurgitation. Valve area by pressure half-time: 1.47cm^2. Indexed valve area by pressure half-time: 0.64cm^2/m^2. Valve area by continuity equation (using LVOT flow): 0.92cm^2. Indexed valve area by continuity equation (using LVOT flow): 0.4cm^2/m^2. Mean gradient: 13mm Hg (D). Peak gradient: 36mm Hg (D).  ------------------------------------------------------------ Left atrium: The atrium was moderately dilated.  ------------------------------------------------------------ Right ventricle: The cavity size was moderately dilated. Systolic function was moderately reduced.  ------------------------------------------------------------ Pulmonic valve: The valve appears to be grossly normal. Doppler: Trivial regurgitation.  ------------------------------------------------------------ Pulmonary artery: Systolic pressure could not be accurately estimated.  ------------------------------------------------------------ Right atrium: The atrium was mildly dilated.  ------------------------------------------------------------ Pericardium: There was no pericardial effusion.  ------------------------------------------------------------ Systemic veins: Inferior vena cava: The vessel was dilated; the respirophasic diameter changes were blunted (<  50%); findings are consistent with elevated central venous pressure.  ------------------------------------------------------------  2D measurements Normal Doppler measurements Normal Left ventricle LVOT LVID ED, 45.4 mm 43-52 Peak vel, 122 cm/s ------ chord, S PLAX VTI, S 34.1 cm ------ LVID ES, 36.4 mm 23-38 Peak 6 mm Hg ------ chord, gradient, PLAX S FS, 20 % >29 Aortic valve chord, Peak vel, 366 cm/s ------ PLAX S LVPW, ED 16.57 mm ------ Mean vel, 281 cm/s ------ IVS/LVPW 1.26 <1.3 S ratio, ED VTI, S 91.1 cm ------ Ventricular septum Mean 36 mm Hg ------ IVS, ED 20.83 mm ------ gradient, LVOT S Diam, S 20 mm ------ Peak 54 mm Hg ------ Area 3.14 cm^2 ------ gradient, Aorta S Root 38 mm ------ VTI ratio 0.37 ------ diam, ED LVOT/AV Left atrium Area, VTI 1.18 cm^2 ------ AP dim 62 mm ------ Area index 0.51 cm^2/m ------ AP dim 2.68 cm/m^2 <2.2 (VTI) ^2 index Peak vel 0.33 ------ ratio, M-mode measurements Normal LVOT/AV Aorta Area, Vmax 1.05 cm^2 ------ Root 32 mm 20-37 Area index 0.45 cm^2/m ------ diam, ED (Vmax) ^2 Left atrium Mitral valve AP dim, 60 mm 19-40 Peak E vel 281 cm/s ------ ES Peak A vel 152 cm/s ------ AP dim 2.6 cm/m^2 <2.2 Mean vel, 160 cm/s ------ index, ES D LA/Ao 1.88 ------ Decelerati 401 ms 150-23 root on time 0 ratio Pressure 150 ms ------ half-time Mean 13 mm Hg ------ gradient, D Peak 36 mm Hg ------ gradient, D Peak E/A 1.8 ------ ratio Area (PHT) 1.47 cm^2 ------ Area index 0.64 cm^2/m ------ (PHT) ^2 Area 0.92 cm^2 ------ (LVOT) continuity Area index 0.4 cm^2/m ------ (LVOT ^2 cont) Annulus 116 cm ------ VTI Regurg 38.5 cm/s ------ alias vel, PISA Max regurg 607 cm/s ------ vel Regurg VTI 205 cm ------ ERO, PISA 0.06 cm^2 ------ Regurg 12 ml ------ vol, PISA Systemic veins Estimated 15 mm Hg ------ CVP  ------------------------------------------------------------ Prepared and Electronically Authenticated  by  McDowell, Samuel 2013-11-22T12:05:36.140    Cardiac Catheterization Procedure Note   Name: Deigo A Sitzman [name]  MRN: [id number]  DOB: 07/25/1940  Procedure: Right Heart Cath, Left Heart Cath, Selective Coronary Angiography, LV angiography  Indication: Congestive heart failure, and shortness of breath, valvular heart disease. This 71-year-old gentleman with prior coronary bypass surgery has developed progressive shortness of breath. A recent echocardiogram has demonstrated moderately severe aortic stenosis and moderately severe mitral stenosis. He presents today   for right and left heart catheterization for full hemodynamic and angiographic assessment.  Procedural Details: The right groin was prepped, draped, and anesthetized with 1% lidocaine. Using the modified Seldinger technique a 6 French sheath was placed in the right femoral artery and a 6 French sheath was placed in the right femoral vein. A multipurpose catheter was used for the right heart catheterization. Standard protocol was followed for recording of right heart pressures and sampling of oxygen saturations. Fick cardiac output was calculated. Standard Judkins catheters were used for selective coronary angiography and left ventriculography. The [name] 4 catheter was used to image the saphenous vein graft obtuse marginal. That same catheter was used to engage the left subclavian artery but I could not selectively engage the LIMA. This was changed out for a LIMA catheter over an exchange length wire. An AL-1 catheter was used to direct a straight tip wire across the aortic valve with a moderate amount of difficulty this was achieved. This was changed out over an exchange length J-wire for a Langston pigtail catheter so that simultaneous LV and aortic pressures could be recorded. A Perclose device was used for femoral artery hemostasis. There were no immediate procedural complications. The patient was transferred to the post catheterization recovery  area for further monitoring.  Procedural Findings:  Hemodynamics  RA mean of 16  RV 94/22  PA 91/22 with a mean of 50  PCWP A wave 30, V wave 33, mean 24  LV 171/32  AO 134/63 with a mean of 89  Oxygen saturations:  PA 53, 55 with a mean of 54  AO 93  SVC 61  Cardiac Output (Fick) 4.5 L per minute  Cardiac Index (Fick) 1.9 L per minute per meter squared  Aortic valve hemodynamics: Mean gradient 41, valve area 0.9 cm, valve area index 0.39 cm per meter square  Coronary angiography:  Coronary dominance: right  Left mainstem: The left mainstem is heavily calcified. The left main is diffusely diseased with 50% stenosis leading into a 90% distal left mainstem stenosis.  Left anterior descending (LAD): The LAD has diffuse heavy calcification and it is totally occluded after the second septal perforator branch.  Left circumflex (LCx): The left circumflex is totally occluded at its origin.  Right coronary artery (RCA): The RCA is severely calcified throughout its course. The mid vessel has a critical 95% stenosis present. This is an area of very severe calcification. There is diffuse disease throughout the right coronary artery distribution. The PDA and posterolateral branches are patent.  Saphenous vein graft to OM: This graft is patent and supplies both the first and second obtuse marginal branches. There is no significant disease noted.  LIMA to LAD: The LIMA graft is widely patent without significant stenosis. However, the native LAD is critically disease throughout its course and it is a very small vessel not suitable for any further revascularization. There is diffuse 95% stenosis throughout. The proximal LAD retrograde fills from the graft.  Left ventriculography: Left ventricular systolic function is normal, LVEF is estimated at 55-65%, there is no significant mitral regurgitation. There is heavy calcification of the mitral annulus and moderate calcification of the aortic valve.  Final  Conclusions:  1. Severe native three-vessel coronary artery disease  2. Status post aortocoronary bypass surgery with continued patency of the LIMA to LAD and saphenous vein graft to obtuse marginal  3. Interval development of severe native LAD disease he on the LIMA anastomotic site and severe native right coronary artery stenosis    4. Preserved left ventricular systolic function  5. Severe aortic stenosis  6. Severely calcified mitral annulus with known moderate to severe mitral stenosis  7. Severe pulmonary hypertension  Recommendations: This patient has a very complex cardiac disease. He's had prior bypass surgery. I'm going to review his case with his primary physician, Dr. Wall. Potential treatment options include: 1) conservative approach considering the extent of his disease and comorbidities versus 2) treatment with PCI of the right coronary artery and consideration of TAVR versus 3) definitive surgical treatment of both his aortic and mitral valve disease as well as bypass of his right coronary artery. Will likely obtain a surgical consult for further discussion.  Michael [name] [name]  02/14/2012, 10:34 AM    6 Minute Walk Test Results  Patient:            [name] [name] Date:               03/10/2012   Supplemental O2 during test?            NO                                       Baseline                                 End  Time                            1430                                        1436 Heartrate                     59                                            74 Dyspnea                      MILD                                        MODERATE Fatigue                        MILD                                        SEVERE O2 sat                         95%                                         93% Blood pressure           138/74                                       154/80   Patient ambulated at a SLOW pace for a total distance of 350 feet with several  stops, could only walk 3 minutes.    Ambulation was limited primarily due to shortness of breath and fatigued.  Overall the test was tolerated poorly.  Pulmonary Function Tests  Baseline                                                                      Post-bronchodilator  FVC                 2.83 L  (68% predicted)          FVC                 2.30 L  (56% predicted) FEV1               2.32 L  (76% predicted)          FEV1               2.04 L  (67% predicted) FEF25-75        2.65 L  (114% predicted)        FEF25-75        1.03 L  (44% predicted)  RV                   2.82 L  (121% predicted) DLCO              61% predicted  STS Risk Calculator  Procedure                                         AVR + redo CABG (does not include MVR)  Risk of Mortality                                12.4% Morbidity or Mortality                       44% Prolonged LOS                                   23% Short LOS                                           13% Permanent Stroke                            2.6% Prolonged Vent Support                      34% DSW Infection                                     1.6% Renal Failure                                         19% Reoperation                                        13%                                Impression:  Patient has likely rheumatic heart disease with severe aortic stenosis and severe mitral stenosis that is further complicated by the presence of severe three-vessel coronary artery disease and 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 The patient has severe pulmonary hypertension that is likely primarily related to his underlying heart disease but further exacerbated by the presence of obstructive sleep apnea and obesity.  He presents with worsening symptoms of exertional shortness of breath with chest tightness, now functional class III. Transthoracic echocardiogram and cardiac catheterization both  confirmed the presence of preserved left ventricular systolic function. However, the patient has very severe and diffuse coronary artery disease, most of which is likely not amenable to redo coronary artery bypass grafting. Risks associated with redo coronary artery bypass grafting, aortic valve replacement and mitral valve replacement would be quite high and are likely underestimated using the STS risk calculator because it does not support double valve procedures. In addition, the patient has chronic skin ulcerations involving both lower legs that are undoubtedly related to chronic venous insufficiency and diabetes. Despite all this the patient seems to remain reasonably active physically although he has clearly gone downhill significantly over the last 6 months.  Options include long-term medical therapy versus high-risk redo CABG with aortic and mitral valve replacement. Combined transcatheter aortic and mitral valve replacement would be investigational if it were to be feasible at all. If this were to be considered than the patient's coronary artery disease would have to be left untreated or perhaps treated only using PCI and stenting of the tight right coronary artery lesion.   Plan:  I have personally reviewed the patient's most recent transthoracic echocardiogram and cardiac catheterization and discussed the results at length with the patient and his wife here in the office today. We discussed and a very general way the fact that regardless of whether or not he chooses to proceed with aggressive treatment he will likely to continue to suffer from congestive heart failure to some degree for the rest of his life. Because of the combined presence of mitral and aortic stenosis and because left ventricular function remains reasonably well-preserved, he might remain somewhat stable on medical therapy.  However, overall I expect that he will continue to experience gradual decline in his functional status with  worsening symptoms of congestive heart failure even in the absence of acute exacerbation of the underlying coronary artery disease.  Unfortunately, risks associated with surgical intervention will be exceptionally high. The patient desires to think matters further but at this point seems to be interested in considering an aggressive approach. We will obtain transesophageal echocardiogram to more carefully assess the severity of the patient's underlying aortic and mitral valve disease, to more carefully image the right ventricle and tricuspid valve function which was not carefully assessed the recent transthoracic echocardiogram. In addition we will obtain vein mapping to see if the patient has any saphenous vein in the left thigh which might be usable if high-risk redo coronary artery bypass grafting and aortic plus mitral valve replacement were to be considered.   The patient will return in 2 weeks to review the results of these tests and discuss matters further. In the meanwhile we will touch base with her colleagues at [facility] and [facility] to see whether or not palliative aortic and mitral valve replacement via transapical transcatheter approach might be feasible.    Lucille Witts H. Dustyn Armbrister, MD 03/10/2012 5:47 PM   

## 2012-03-10 NOTE — Progress Notes (Deleted)
6 Minute Walk Test Results  Patient: [name] [name] Date:  03/10/2012   Supplemental O2 during test? NO      Baseline   End  Time   1430    1436 Heartrate  59    74 Dyspnea  MILD    MODERATE Fatigue  MILD    SEVERE O2 sat   95%    93% Blood pressure 138/74    154/80   Patient ambulated at a SLOW pace for a total distance of 350 feet with several stops, could only walk 3 minutes.    Ambulation was limited primarily due to shortness of breath and fatigued.  Overall the test was tolerated poorly.  Pulmonary Function Tests  Baseline      Post-bronchodilator  FVC  2.83 L  (68% predicted) FVC  2.30 L  (56% predicted) FEV1  2.32 L  (76% predicted) FEV1  2.04 L  (67% predicted) FEF25-75 2.65 L  (114% predicted) FEF25-75 1.03 L  (44% predicted)  RV  2.82 L  (121% predicted) DLCO  61% predicted  STS Risk Calculator  Procedure    AVR + redo CABG (does not include MVR)  Risk of Mortality   12.4% Morbidity or Mortality  44% Prolonged LOS   23% Short LOS    13% Permanent Stroke   2.6% Prolonged Vent Support  34% DSW Infection    1.6% Renal Failure    19% Reoperation    13%    Review of Systems:   General:  NO appetite, NO energy, NO weight gain, NO weight loss, NO fever  Cardiac:  YES chest pain with exertion, NO chest pain at rest, YES SOB with exertion, NO resting SOB, NO PND, NO orthopnea, NO palpitations, NO arrhythmia, NO atrial fibrillation, YES LE edema, NO dizzy spells, NO syncope  Respiratory:  NO shortness of breath, NO home oxygen, NO productive cough, NO dry cough, NO bronchitis, NO wheezing, NO hemoptysis, NO asthma, NO pain with inspiration or cough, YES sleep apnea, NO CPAP at night  GI:   NO difficulty swallowing, YES reflux, NO frequent heartburn, NO hiatal hernia, NO abdominal pain, NO constipation, NO diarrhea, NO hematochezia, NO hematemesis, NO melena  GU:   NO dysuria,  NO frequency, NO urinary tract infection, NO hematuria, NO enlarged prostate, NO  kidney stones, NO kidney disease  Vascular:  NO pain suggestive of claudication, NO pain in feet, NO leg cramps, NO varicose veins, NO DVT, YES non-healing foot ulcer (ON LEGS)  Neuro:   NO stroke, NO TIA's, NO seizures, NO headaches, YES temporary blindness one eye,  NO slurred speech, NO peripheral neuropathy, NO chronic pain, NO instability of gait, NO memory/cognitive dysfunction  Musculoskeletal: NO arthritis, NO joint swelling, NO myalgias, YES difficulty walking, NO mobility   Skin:   NO rash, NO itching, NO skin infections, NO pressure sores or ulcerations  Psych:   NO anxiety, NO depression, NO nervousness, YES unusual recent stress  Eyes:   NO blurry vision, YES floaters, NO recent vision changes, NO wears glasses or contacts  ENT:   YES hearing loss, NO loose or painful teeth, NO dentures, last saw dentist 2013  Hematologic:  NO easy bruising, YES abnormal bleeding, NOclotting disorder, NO frequent epistaxis  Endocrine:  YES diabetes, does check CBG's at home YES

## 2012-03-10 NOTE — Patient Instructions (Signed)
Schedule TEE and vein mapping as soon as practical

## 2012-03-11 ENCOUNTER — Other Ambulatory Visit: Payer: Self-pay | Admitting: *Deleted

## 2012-03-11 ENCOUNTER — Telehealth: Payer: Self-pay | Admitting: Cardiology

## 2012-03-11 DIAGNOSIS — I359 Nonrheumatic aortic valve disorder, unspecified: Secondary | ICD-10-CM

## 2012-03-11 DIAGNOSIS — I059 Rheumatic mitral valve disease, unspecified: Secondary | ICD-10-CM

## 2012-03-11 DIAGNOSIS — I2510 Atherosclerotic heart disease of native coronary artery without angina pectoris: Secondary | ICD-10-CM

## 2012-03-11 NOTE — Telephone Encounter (Signed)
Calling to let you know TEE does not require precert

## 2012-03-13 ENCOUNTER — Encounter: Payer: Self-pay | Admitting: *Deleted

## 2012-03-13 NOTE — Telephone Encounter (Signed)
TEE scheduled for 03/20/12 11AM Dr Shirlee Latch. Spoke with pt, instructions mailed to pt.

## 2012-03-13 NOTE — Telephone Encounter (Signed)
   Jed Limerick, Yosef A ','<<< Less Detail       Laurey Morale, MD       Sent:  Mon March 10, 2012 10:59 PM   To:  Purcell Nails, MD   Cc:  Jacqlyn Krauss, RN                 Message     Will do. I'll get my nurse to arrange.    ----- Message -----   From: Purcell Nails, MD   Sent: 03/10/2012 6:24 PM   To: Laurey Morale, MD      Freida Busman - I saw [name] today. Can you get him in for a TEE within the next week or two? I want to get a better look at both the aortic and mitral valves and to look for TR. Thanks. Cub

## 2012-03-20 ENCOUNTER — Other Ambulatory Visit: Payer: Self-pay | Admitting: *Deleted

## 2012-03-20 ENCOUNTER — Encounter (HOSPITAL_COMMUNITY): Payer: Self-pay

## 2012-03-20 ENCOUNTER — Ambulatory Visit (HOSPITAL_COMMUNITY)
Admission: RE | Admit: 2012-03-20 | Discharge: 2012-03-20 | Disposition: A | Discharge status: Home / Self Care | Payer: Medicare Other | Source: Ambulatory Visit | Attending: Family Medicine | Admitting: Family Medicine

## 2012-03-20 ENCOUNTER — Ambulatory Visit (HOSPITAL_COMMUNITY)
Admission: RE | Admit: 2012-03-20 | Discharge: 2012-03-20 | Disposition: A | Discharge status: Home / Self Care | Payer: Medicare Other | Source: Ambulatory Visit | Attending: Cardiology | Admitting: Cardiology

## 2012-03-20 ENCOUNTER — Encounter (HOSPITAL_COMMUNITY)
Admission: RE | Disposition: A | Discharge status: Home / Self Care | Payer: Self-pay | Source: Ambulatory Visit | Attending: Cardiology

## 2012-03-20 DIAGNOSIS — Z01810 Encounter for preprocedural cardiovascular examination: Secondary | ICD-10-CM

## 2012-03-20 DIAGNOSIS — I2510 Atherosclerotic heart disease of native coronary artery without angina pectoris: Secondary | ICD-10-CM | POA: Insufficient documentation

## 2012-03-20 DIAGNOSIS — I359 Nonrheumatic aortic valve disorder, unspecified: Secondary | ICD-10-CM | POA: Insufficient documentation

## 2012-03-20 DIAGNOSIS — I059 Rheumatic mitral valve disease, unspecified: Secondary | ICD-10-CM | POA: Insufficient documentation

## 2012-03-20 DIAGNOSIS — L97909 Non-pressure chronic ulcer of unspecified part of unspecified lower leg with unspecified severity: Secondary | ICD-10-CM | POA: Insufficient documentation

## 2012-03-20 DIAGNOSIS — E119 Type 2 diabetes mellitus without complications: Secondary | ICD-10-CM | POA: Insufficient documentation

## 2012-03-20 DIAGNOSIS — I872 Venous insufficiency (chronic) (peripheral): Secondary | ICD-10-CM | POA: Insufficient documentation

## 2012-03-20 HISTORY — PX: TEE WITHOUT CARDIOVERSION: SHX5443

## 2012-03-20 SURGERY — ECHOCARDIOGRAM, TRANSESOPHAGEAL
Anesthesia: Moderate Sedation

## 2012-03-20 MED ORDER — MIDAZOLAM HCL 10 MG/2ML IJ SOLN
INTRAMUSCULAR | Status: DC | PRN
  Administered 2012-03-20 (×2): 2 mg via INTRAVENOUS

## 2012-03-20 MED ORDER — MIDAZOLAM HCL 5 MG/ML IJ SOLN
INTRAMUSCULAR | Status: AC
  Filled 2012-03-20: qty 2

## 2012-03-20 MED ORDER — BUTAMBEN-TETRACAINE-BENZOCAINE 2-2-14 % EX AERO
INHALATION_SPRAY | CUTANEOUS | Status: DC | PRN
  Administered 2012-03-20: 2 via TOPICAL

## 2012-03-20 MED ORDER — FENTANYL CITRATE 0.05 MG/ML IJ SOLN
INTRAMUSCULAR | Status: AC
  Filled 2012-03-20: qty 2

## 2012-03-20 MED ORDER — FENTANYL CITRATE 0.05 MG/ML IJ SOLN
INTRAMUSCULAR | Status: DC | PRN
  Administered 2012-03-20: 25 ug via INTRAVENOUS
  Administered 2012-03-20: 50 ug via INTRAVENOUS

## 2012-03-20 MED ORDER — SODIUM CHLORIDE 0.9 % IV SOLN
INTRAVENOUS | Status: DC
  Administered 2012-03-20: 500 mL via INTRAVENOUS

## 2012-03-20 NOTE — Progress Notes (Signed)
  Echocardiogram Echocardiogram Transesophageal has been performed.  [name] [name] 03/20/2012, 3:15 PM

## 2012-03-20 NOTE — Interval H&P Note (Signed)
History and Physical Interval Note:  03/20/2012 11:38 AM  [name] [name]  has presented today for surgery, with the diagnosis of asms/tr/ [name]/[name]  The various methods of treatment have been discussed with the patient and family. After consideration of risks, benefits and other options for treatment, the patient has consented to  Procedure(s) (LRB) with comments: TRANSESOPHAGEAL ECHOCARDIOGRAM (TEE) (N/A) as a surgical intervention .  The patient's history has been reviewed, patient examined, no change in status, stable for surgery.  I have reviewed the patient's chart and labs.  Questions were answered to the patient's satisfaction.     Dalton [employer]

## 2012-03-20 NOTE — CV Procedure (Signed)
Procedure: TEE  Indication: Mitral and aortic stenosis.   Sedation: Versed 4 mg IV, Fentanyl 75 mcg IV  Procedure: Please see report in echo section for full details.  Difficult procedure.  Patient with aggressive gag reflex so hard to pass probe.  Extensive valvular calcification created significant shadowing making images difficult.   Normal LV size with mild LV hypertrophy.  EF 55% grossly.  The mitral valve was severely calcified throughout with moderate to severe stenosis (moderate by mean gradient 9 mmHg, severe by MVA 0.9 cm^2). Mild mitral regurgitation.  The aortic valve was severely calcified and probably trileaflet.  Leaflet opening was severely restricted. Unable to obtain a mean gradient by TEE but in severe AS range on TTE.  Normal-appearing tricuspid valve with trivial TR.  The right ventricle was mildly dilated with mild to moderate systolic dysfunction.   Impression: Severe AS and moderate to severe MS.  Both valves were heavily calcified.  Valvuloplasty would not be an option for the mitral valve.  Possible rheumatic etiology but difficult to say for sure.  The RV systolic function was mildly to moderately depressed.

## 2012-03-20 NOTE — H&P (View-Only) (Signed)
[address]            [name] [name] [address]          [phone]     CARDIOTHORACIC SURGERY CONSULTATION REPORT  Referring Provider is [name] [name], [name] Sans, MD PCP is [name] Givens, MD  Chief Complaint  Patient presents with  . Aortic Stenosis  . Mitral Stenosis  . Coronary Artery Disease    HPI:  Patient is a 72 year old obese white male with long-standing history of coronary artery disease, hypertension, type 2 diabetes mellitus, chronic venous insufficiency with bilateral lower extremity ulcerations, and obstructive sleep apnea we'll also has been recently found to have severe aortic stenosis and severe mitral regurgitation with worsening symptoms of chronic diastolic congestive heart failure. The patient's cardiac history dates back more than 15 years ago when he first presented with angina pectoris. He underwent an attempted percutaneous coronary intervention but subsequently underwent coronary artery bypass grafting x6 by Dr. Andrey [name] in 1998 for severe three-vessel coronary artery disease. Grafts placed at the time of surgery included left internal mammary artery placed sequentially to the diagonal and the distal left anterior descending coronary artery, saphenous vein graft placed sequentially to the first and second obtuse marginal branch of left circumflex coronary artery, and saphenous vein graft placed sequentially to the posterior descending coronary artery and the right posterolateral branch. The patient initially did well but apparently 2 years later underwent PCI and stenting.  The patient reports that he continued to do fairly well for many years.  Over the past year or 2 he has had continued to slow down physically do to the development of gradual progression of exertional shortness of breath and chest tightness as well as increasing problems with his vision do to severe diabetic retinopathy with recurrent hemorrhages in his right eye. Despite this he  he has remained reasonably active physically until the last several months, during which time he has developed further significant progression of exertional shortness of breath and chest tightness. He states that he now gets short of breath with a mild tight feeling across his chest with relatively mild activity. This never occurs at rest symptoms are usually promptly relieved within a few minutes of rest. He has never taken any nitroglycerin.  He denies any history of PND. He has chronic orthopnea and chronic severe bilateral lower extremity edema. He has not had any tachypalpitations, dizzy spells, nor syncope.  He reports that he gets fatigued quite easily.  The patient was seen in followup by Dr. Daleen [name] in November. An echocardiogram was performed demonstrating moderate to severe aortic stenosis and moderate to severe mitral stenosis with preserved left ventricular systolic function. The patient was referred to Dr. Excell [name] who performed left and right heart catheterization. This confirmed the presence of severe aortic stenosis with severe native coronary artery disease and severe vein graft disease. There was severe pulmonary hypertension. The patient has now been referred to consider high-risk surgical intervention.  Past Medical History  Diagnosis Date  . GERD (gastroesophageal reflux disease)   . Hyperlipidemia   . Hypertension   . Diabetes mellitus   . Sleep apnea   . Carotid artery disease   . Cellulitis and abscess of leg, except foot   . Leg edema   . Right knee sprain   . Cramps, muscle, general   . Low back pain   . Hiatal hernia   .  Chills   . Hearing loss   . Nasal congestion   . Cough   . Wheezing   . Generalized headaches   . 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 Mitral stenosis 03/10/2012  . Chronic diastolic congestive heart failure 03/10/2012  . Aortic stenosis 03/10/2012    . Pulmonary hypertension 03/10/2012  . Venous insufficiency (chronic) (peripheral) 03/10/2012  . CAD, ARTERY BYPASS GRAFT 06/24/2008    Qualifier: Diagnosis of  By: [name] [name]      Past Surgical History  Procedure Date  . Ett cardiolite 12/31/2006    Mild-Mod distal inf/apical ischemia  . Open heart surgery   . Acute or chronic diastolic hf 8/6/ - 10/11/2008    RLE cellulitis - HOSP  . Doppler echocardiography 10/11/2008    Mild AS, Mild-Mod MS, Severe LVH  . Le arterial and venous US 10/11/2008    Art clear.  Venous Neg DVT  . Appendectomy 12/25/2010    Dr [name] [name]  . Umbilical hernia repair 12/25/2010    Dr [name] [name]  . Hernia repair 12/25/10    umbilical hernia repair     Family History  Problem Relation Age of Onset  . Diabetes Other     History   Social History  . Marital Status: Married    Spouse Name: N/A    Number of Children: N/A  . Years of Education: N/A   Occupational History  . Not on file.   Social History Main Topics  . Smoking status: Never Smoker   . Smokeless tobacco: Never Used  . Alcohol Use: No  . Drug Use: No  . Sexually Active: Not on file   Other Topics Concern  . Not on file   Social History Narrative  . No narrative on file    Current Outpatient Prescriptions  Medication Sig Dispense Refill  . aspirin 81 MG tablet Take 81 mg by mouth daily.        . clopidogrel (PLAVIX) 75 MG tablet Take 1 tablet (75 mg total) by mouth daily.  30 tablet  5  . co-enzyme Q-10 30 MG capsule Take 30 mg by mouth daily.        Marland Kitchen ezetimibe (ZETIA) 10 MG tablet Take 1 tablet (10 mg total) by mouth daily.  30 tablet  5  . fenofibrate micronized (ANTARA) 130 MG capsule Take 1 capsule (130 mg total) by mouth daily before breakfast.  30 capsule  5  . fish oil-omega-3 fatty acids 1000 MG capsule Take 2 g by mouth 2 (two) times daily.        . furosemide (LASIX) 80 MG tablet Take 1 tablet (80 mg total) by mouth daily.  30 tablet  5  . insulin glargine (LANTUS)  100 UNIT/ML injection Inject 90 Units into the skin 3 (three) times daily.        Marland Kitchen JANUVIA 100 MG tablet       . LOVAZA 1 G capsule       . metFORMIN (GLUCOPHAGE) 1000 MG tablet Take 1,000 mg by mouth 2 (two) times daily with a meal.        . metoprolol (LOPRESSOR) 50 MG tablet Take 1 tablet (50 mg total) by mouth 2 (two) times daily.  60 tablet  5  . nitroGLYCERIN (NITROSTAT) 0.4 MG SL tablet Place 1 tablet (0.4 mg total)  under the tongue every 5 (five) minutes as needed for chest pain. May repeat up to 3 times as needed  30 tablet  5  . omeprazole (PRILOSEC) 20 MG capsule       . pantoprazole (PROTONIX) 40 MG tablet Take 1 tablet (40 mg total) by mouth daily.  30 tablet  5  . ramipril (ALTACE) 10 MG tablet Take 1 tablet (10 mg total) by mouth daily.  30 tablet  5  . ranitidine (ZANTAC) 75 MG tablet Take 75 mg by mouth 2 (two) times daily.        . repaglinide (PRANDIN) 2 MG tablet Take 2 mg by mouth 3 (three) times daily before meals.        . rosuvastatin (CRESTOR) 20 MG tablet Take 1 tablet (20 mg total) by mouth at bedtime.  30 tablet  6  . sertraline (ZOLOFT) 50 MG tablet Take 1 tablet (50 mg total) by mouth daily.  90 tablet  0  . Vitamin D, Ergocalciferol, (DRISDOL) 50000 UNITS CAPS Take 50,000 Units by mouth every 7 (seven) days.        . [DISCONTINUED] potassium chloride (KLOR-CON) 20 MEQ packet Take 20 mEq by mouth daily.        . [DISCONTINUED] promethazine (PHENERGAN) 25 MG tablet Take 1/2 to 1 tablet up to every 6 hours as needed for nausea.         Allergies  Allergen Reactions  . Celecoxib        Review of Systems:   General:  decreased appetite, decreased energy, no weight gain, no weight loss, no fever  Cardiac:  + chest pain with exertion, no chest pain at rest, + SOB with mild exertion, no resting SOB, no PND, + orthopnea, no palpitations, no arrhythmia, no atrial fibrillation, + severe LE edema, no dizzy spells, no syncope  Respiratory:  + shortness of breath, no  home oxygen, no productive cough, no dry cough, no bronchitis, no wheezing, no hemoptysis, no asthma, no pain with inspiration or cough, + sleep apnea, does not use CPAP at night  GI:   no difficulty swallowing, + reflux, no frequent heartburn, no hiatal hernia, no abdominal pain, no constipation, no diarrhea, no hematochezia, no hematemesis, no melena  GU:   no dysuria,  no frequency, no urinary tract infection, no hematuria, no enlarged prostate, no kidney stones, mild kidney disease last creatinine 1.4  Vascular:  no pain suggestive of claudication, no pain in feet, no leg cramps, no varicose veins, no DVT, + non-healing ulcerations both lower legs  Neuro:   no stroke, no TIA's, no seizures, + headaches, + temporary blindness right eye,  no slurred speech, + peripheral neuropathy, no chronic pain, no instability of gait, no memory/cognitive dysfunction  Musculoskeletal: no arthritis, no joint swelling, no myalgias, mild difficulty walking, mildly limited mobility   Skin:   + chronic rash both lower legs, no itching, + chronic skin infections, + chronic pressure sores or ulcerations  Psych:   no anxiety, no depression, no nervousness, + unusual recent stress  Eyes:   + blurry vision, + floaters, + recent vision changes, does not wear glasses or contacts  ENT:   + hearing loss, no loose or painful teeth, no dentures, last saw dentist 2013  Hematologic:  no easy bruising, + abnormal bleeding, no clotting disorder, no frequent epistaxis  Endocrine:  + diabetes, checks CBG's at home, last Hgb A1C reported 7.5     Physical Exam:   BP 138/70  Pulse 62  Resp 20  Ht 5\' 8"  (1.727 m)  Wt 242 lb (109.77 kg)  BMI 36.80 kg/m2  SpO2 90%  General:  Obese but otherwise fairly  well-appearing  HEENT:  Unremarkable   Neck:   no JVD, no bruits, no adenopathy   Chest:   Few bibasilar inspiratory crackles, symmetrical breath sounds, no wheezes, no rhonchi   CV:   RRR, grade III/VI systolic murmur best  LSB  Abdomen:  soft, non-tender, no masses, no obvious ascites  Extremities:  warm, well-perfused, pulses not palpable, + severe bilateral LE edema, chronic skin changes from venous insufficiency and diabetes both lower legs with a few areas of skin breakdown but no active cellulitis  Rectal/GU  Deferred  Neuro:   Grossly non-focal and symmetrical throughout  Skin:   Clean and dry other than lower legs, no rashes   Diagnostic Tests:   Transthoracic Echocardiography  Patient: [name] [name], [name] [name] MR #: [id number] Study Date: 01/25/2012 Gender: M Age: 89 Height: 177.8cm Weight: 115.2kg BSA: 2.16m^2 Pt. Status: Room:  SONOGRAPHER Karrie Doffing ATTENDING Valera Castle, MD ORDERING Valera Castle, MD REFERRING Valera Castle, MD PERFORMING Delma Freeze Penn cc:  ------------------------------------------------------------ LV EF: 65% - 70%  ------------------------------------------------------------ Indications: Aortic stenosis 424.1.  ------------------------------------------------------------ History: PMH: Dyspnea. Coronary artery disease. Aortic valve disease. Mitral valve disease. PMH: Hiatal hernia, stents, edema  ------------------------------------------------------------ Study Conclusions  - Left ventricle: The cavity size was normal. Wall thickness was increased in a pattern of moderate LVH. There was severe asymmetric hypertrophy of the septum. Systolic function was vigorous. The estimated ejection fraction was in the range of 65% to 70%. The study is not technically sufficient to allow evaluation of LV diastolic function. - Aortic valve: Trileaflet; moderately thickened, moderately calcified leaflets. Cusp separation was moderately reduced. There was moderate to severe stenosis - more in moderate range by planimetry. No significant regurgitation. Mean gradient: 36mm Hg (S). Valve area: 1.18cm^2(VTI). Peak velocity ratio of LVOT to aortic valve: 0.33. - Mitral  valve: Moderately calcified annulus. Severely thickened, moderately calcified leaflets . Mobility was severely restricted. The findings are consistent with moderate to severe stenosis. Trivial regurgitation. Mean gradient: 13mm Hg (D). Valve area by pressure half-time: 1.47cm^2. Valve area by continuity equation (using LVOT flow): 0.92cm^2. - Left atrium: The atrium was moderately dilated. - Right ventricle: The cavity size was moderately dilated. Systolic function was moderately reduced. - Right atrium: The atrium was mildly dilated. Central venous pressure: 15mm Hg (est). - Pulmonic valve: Trivial regurgitation. - Pericardium, extracardiac: There was no pericardial effusion.  ------------------------------------------------------------ Labs, prior tests, procedures, and surgery: Coronary artery bypass grafting.  Transthoracic echocardiography. M-mode, complete 2D, spectral Doppler, and color Doppler. Height: Height: 177.8cm. Height: 70in. Weight: Weight: 115.2kg. Weight: 253.5lb. Body mass index: BMI: 36.4kg/m^2. Body surface area: BSA: 2.62m^2. Patient status: Outpatient. Location: Echo laboratory.  ------------------------------------------------------------  ------------------------------------------------------------ Left ventricle: The cavity size was normal. Wall thickness was increased in a pattern of moderate LVH. There was severe asymmetric hypertrophy of the septum. Systolic function was vigorous. The estimated ejection fraction was in the range of 65% to 70%. The study is not technically sufficient to allow evaluation of LV diastolic function.  ------------------------------------------------------------ Aortic valve: Trileaflet; moderately thickened, moderately calcified leaflets. Cusp separation was moderately reduced. Doppler: There was moderate to severe stenosis - more in moderate range by planimetry. No significant regurgitation. VTI ratio of LVOT to  aortic valve: 0.37. Valve area: 1.18cm^2(VTI). Indexed valve area: 0.51cm^2/m^2 (VTI). Peak velocity ratio of LVOT to  aortic valve: 0.33. Valve area: 1.05cm^2 (Vmax). Indexed valve area: 0.45cm^2/m^2 (Vmax). Mean gradient: 36mm Hg (S). Peak gradient: 54mm Hg (S).  ------------------------------------------------------------ Aorta: Aortic root: The aortic root was mildly ectatic.  ------------------------------------------------------------ Mitral valve: Moderately calcified annulus. Severely thickened, moderately calcified leaflets . Mobility was severely restricted. Doppler: The findings are consistent with moderate to severe stenosis. Trivial regurgitation. Valve area by pressure half-time: 1.47cm^2. Indexed valve area by pressure half-time: 0.64cm^2/m^2. Valve area by continuity equation (using LVOT flow): 0.92cm^2. Indexed valve area by continuity equation (using LVOT flow): 0.4cm^2/m^2. Mean gradient: 13mm Hg (D). Peak gradient: 36mm Hg (D).  ------------------------------------------------------------ Left atrium: The atrium was moderately dilated.  ------------------------------------------------------------ Right ventricle: The cavity size was moderately dilated. Systolic function was moderately reduced.  ------------------------------------------------------------ Pulmonic valve: The valve appears to be grossly normal. Doppler: Trivial regurgitation.  ------------------------------------------------------------ Pulmonary artery: Systolic pressure could not be accurately estimated.  ------------------------------------------------------------ Right atrium: The atrium was mildly dilated.  ------------------------------------------------------------ Pericardium: There was no pericardial effusion.  ------------------------------------------------------------ Systemic veins: Inferior vena cava: The vessel was dilated; the respirophasic diameter changes were blunted (<  50%); findings are consistent with elevated central venous pressure.  ------------------------------------------------------------  2D measurements Normal Doppler measurements Normal Left ventricle LVOT LVID ED, 45.4 mm 43-52 Peak vel, 122 cm/s ------ chord, S PLAX VTI, S 34.1 cm ------ LVID ES, 36.4 mm 23-38 Peak 6 mm Hg ------ chord, gradient, PLAX S FS, 20 % >29 Aortic valve chord, Peak vel, 366 cm/s ------ PLAX S LVPW, ED 16.57 mm ------ Mean vel, 281 cm/s ------ IVS/LVPW 1.26 <1.3 S ratio, ED VTI, S 91.1 cm ------ Ventricular septum Mean 36 mm Hg ------ IVS, ED 20.83 mm ------ gradient, LVOT S Diam, S 20 mm ------ Peak 54 mm Hg ------ Area 3.14 cm^2 ------ gradient, Aorta S Root 38 mm ------ VTI ratio 0.37 ------ diam, ED LVOT/AV Left atrium Area, VTI 1.18 cm^2 ------ AP dim 62 mm ------ Area index 0.51 cm^2/m ------ AP dim 2.68 cm/m^2 <2.2 (VTI) ^2 index Peak vel 0.33 ------ ratio, M-mode measurements Normal LVOT/AV Aorta Area, Vmax 1.05 cm^2 ------ Root 32 mm 20-37 Area index 0.45 cm^2/m ------ diam, ED (Vmax) ^2 Left atrium Mitral valve AP dim, 60 mm 19-40 Peak E vel 281 cm/s ------ ES Peak A vel 152 cm/s ------ AP dim 2.6 cm/m^2 <2.2 Mean vel, 160 cm/s ------ index, ES D LA/Ao 1.88 ------ Decelerati 401 ms 150-23 root on time 0 ratio Pressure 150 ms ------ half-time Mean 13 mm Hg ------ gradient, D Peak 36 mm Hg ------ gradient, D Peak E/A 1.8 ------ ratio Area (PHT) 1.47 cm^2 ------ Area index 0.64 cm^2/m ------ (PHT) ^2 Area 0.92 cm^2 ------ (LVOT) continuity Area index 0.4 cm^2/m ------ (LVOT ^2 cont) Annulus 116 cm ------ VTI Regurg 38.5 cm/s ------ alias vel, PISA Max regurg 607 cm/s ------ vel Regurg VTI 205 cm ------ ERO, PISA 0.06 cm^2 ------ Regurg 12 ml ------ vol, PISA Systemic veins Estimated 15 mm Hg ------ CVP  ------------------------------------------------------------ Prepared and Electronically Authenticated  by  Nona Dell 2013-11-22T12:05:36.140    Cardiac Catheterization Procedure Note   Name: [name] [name]  MRN: [id number]  DOB: [birth year]  Procedure: Right Heart Cath, Left Heart Cath, Selective Coronary Angiography, LV angiography  Indication: Congestive heart failure, and shortness of breath, valvular heart disease. This 72 year old gentleman with prior coronary bypass surgery has developed progressive shortness of breath. A recent echocardiogram has demonstrated moderately severe aortic stenosis and moderately severe mitral stenosis. He presents today  for right and left heart catheterization for full hemodynamic and angiographic assessment.  Procedural Details: The right groin was prepped, draped, and anesthetized with 1% lidocaine. Using the modified Seldinger technique a 6 French sheath was placed in the right femoral artery and a 6 French sheath was placed in the right femoral vein. A multipurpose catheter was used for the right heart catheterization. Standard protocol was followed for recording of right heart pressures and sampling of oxygen saturations. Fick cardiac output was calculated. Standard Judkins catheters were used for selective coronary angiography and left ventriculography. The JR 4 catheter was used to image the saphenous vein graft obtuse marginal. That same catheter was used to engage the left subclavian artery but I could not selectively engage the LIMA. This was changed out for a LIMA catheter over an exchange length wire. An AL-1 catheter was used to direct a straight tip wire across the aortic valve with a moderate amount of difficulty this was achieved. This was changed out over an exchange length J-wire for a Langston pigtail catheter so that simultaneous LV and aortic pressures could be recorded. A Perclose device was used for femoral artery hemostasis. There were no immediate procedural complications. The patient was transferred to the post catheterization recovery  area for further monitoring.  Procedural Findings:  Hemodynamics  RA mean of 16  RV 94/22  PA 91/22 with a mean of 50  PCWP A wave 30, V wave 33, mean 24  LV 171/32  AO 134/63 with a mean of 89  Oxygen saturations:  PA 53, 55 with a mean of 54  AO 93  SVC 61  Cardiac Output (Fick) 4.5 L per minute  Cardiac Index (Fick) 1.9 L per minute per meter squared  Aortic valve hemodynamics: Mean gradient 41, valve area 0.9 cm, valve area index 0.39 cm per meter square  Coronary angiography:  Coronary dominance: right  Left mainstem: The left mainstem is heavily calcified. The left main is diffusely diseased with 50% stenosis leading into a 90% distal left mainstem stenosis.  Left anterior descending (LAD): The LAD has diffuse heavy calcification and it is totally occluded after the second septal perforator branch.  Left circumflex (LCx): The left circumflex is totally occluded at its origin.  Right coronary artery (RCA): The RCA is severely calcified throughout its course. The mid vessel has a critical 95% stenosis present. This is an area of very severe calcification. There is diffuse disease throughout the right coronary artery distribution. The PDA and posterolateral branches are patent.  Saphenous vein graft to OM: This graft is patent and supplies both the first and second obtuse marginal branches. There is no significant disease noted.  LIMA to LAD: The LIMA graft is widely patent without significant stenosis. However, the native LAD is critically disease throughout its course and it is a very small vessel not suitable for any further revascularization. There is diffuse 95% stenosis throughout. The proximal LAD retrograde fills from the graft.  Left ventriculography: Left ventricular systolic function is normal, LVEF is estimated at 55-65%, there is no significant mitral regurgitation. There is heavy calcification of the mitral annulus and moderate calcification of the aortic valve.  Final  Conclusions:  1. Severe native three-vessel coronary artery disease  2. Status post aortocoronary bypass surgery with continued patency of the LIMA to LAD and saphenous vein graft to obtuse marginal  3. Interval development of severe native LAD disease he on the LIMA anastomotic site and severe native right coronary artery stenosis  4. Preserved left ventricular systolic function  5. Severe aortic stenosis  6. Severely calcified mitral annulus with known moderate to severe mitral stenosis  7. Severe pulmonary hypertension  Recommendations: This patient has a very complex cardiac disease. He's had prior bypass surgery. I'm going to review his case with his primary physician, Dr. Daleen [name]. Potential treatment options include: 1) conservative approach considering the extent of his disease and comorbidities versus 2) treatment with PCI of the right coronary artery and consideration of TAVR versus 3) definitive surgical treatment of both his aortic and mitral valve disease as well as bypass of his right coronary artery. Will likely obtain a surgical consult for further discussion.  [name]  02/14/2012, 10:34 AM    6 Minute Walk Test Results  Patient:            [name] [name] Date:               03/10/2012   Supplemental O2 during test?            NO                                       Baseline                                 End  Time                            1430                                        1436 Heartrate                     59                                            74 Dyspnea                      MILD                                        MODERATE Fatigue                        MILD                                        SEVERE O2 sat                         95%                                         93% Blood pressure           138/74  154/80   Patient ambulated at a SLOW pace for a total distance of 350 feet with several  stops, could only walk 3 minutes.    Ambulation was limited primarily due to shortness of breath and fatigued.  Overall the test was tolerated poorly.  Pulmonary Function Tests  Baseline                                                                      Post-bronchodilator  FVC                 2.83 L  (68% predicted)          FVC                 2.30 L  (56% predicted) FEV1               2.32 L  (76% predicted)          FEV1               2.04 L  (67% predicted) FEF25-75        2.65 L  (114% predicted)        FEF25-75        1.03 L  (44% predicted)  RV                   2.82 L  (121% predicted) DLCO              61% predicted  STS Risk Calculator  Procedure                                         AVR + redo CABG (does not include MVR)  Risk of Mortality                                12.4% Morbidity or Mortality                       44% Prolonged LOS                                   23% Short LOS                                           13% Permanent Stroke                            2.6% Prolonged Vent Support                      34% DSW Infection                                     1.6% Renal Failure  19% Reoperation                                        13%                                Impression:  Patient has likely rheumatic heart disease with severe aortic stenosis and severe mitral stenosis that is further complicated by the presence of severe three-vessel coronary artery disease and 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 The patient has severe pulmonary hypertension that is likely primarily related to his underlying heart disease but further exacerbated by the presence of obstructive sleep apnea and obesity.  He presents with worsening symptoms of exertional shortness of breath with chest tightness, now functional class III. Transthoracic echocardiogram and cardiac catheterization both  confirmed the presence of preserved left ventricular systolic function. However, the patient has very severe and diffuse coronary artery disease, most of which is likely not amenable to redo coronary artery bypass grafting. Risks associated with redo coronary artery bypass grafting, aortic valve replacement and mitral valve replacement would be quite high and are likely underestimated using the STS risk calculator because it does not support double valve procedures. In addition, the patient has chronic skin ulcerations involving both lower legs that are undoubtedly related to chronic venous insufficiency and diabetes. Despite all this the patient seems to remain reasonably active physically although he has clearly gone downhill significantly over the last 6 months.  Options include long-term medical therapy versus high-risk redo CABG with aortic and mitral valve replacement. Combined transcatheter aortic and mitral valve replacement would be investigational if it were to be feasible at all. If this were to be considered than the patient's coronary artery disease would have to be left untreated or perhaps treated only using PCI and stenting of the tight right coronary artery lesion.   Plan:  I have personally reviewed the patient's most recent transthoracic echocardiogram and cardiac catheterization and discussed the results at length with the patient and his wife here in the office today. We discussed and a very general way the fact that regardless of whether or not he chooses to proceed with aggressive treatment he will likely to continue to suffer from congestive heart failure to some degree for the rest of his life. Because of the combined presence of mitral and aortic stenosis and because left ventricular function remains reasonably well-preserved, he might remain somewhat stable on medical therapy.  However, overall I expect that he will continue to experience gradual decline in his functional status with  worsening symptoms of congestive heart failure even in the absence of acute exacerbation of the underlying coronary artery disease.  Unfortunately, risks associated with surgical intervention will be exceptionally high. The patient desires to think matters further but at this point seems to be interested in considering an aggressive approach. We will obtain transesophageal echocardiogram to more carefully assess the severity of the patient's underlying aortic and mitral valve disease, to more carefully image the right ventricle and tricuspid valve function which was not carefully assessed the recent transthoracic echocardiogram. In addition we will obtain vein mapping to see if the patient has any saphenous vein in the left thigh which might be usable if high-risk redo coronary artery bypass grafting and aortic plus mitral valve replacement were to be considered.  The patient will return in 2 weeks to review the results of these tests and discuss matters further. In the meanwhile we will touch base with her colleagues at [facility] and the [facility] [place] to see whether or not palliative aortic and mitral valve replacement via transapical transcatheter approach might be feasible.    Salvatore Decent. Cornelius Moras, MD 03/10/2012 5:47 PM

## 2012-03-20 NOTE — Progress Notes (Signed)
VASCULAR LAB PRELIMINARY  PRELIMINARY  PRELIMINARY  PRELIMINARY  Right Lower Extremity Vein Map    Right Great Saphenous Vein   Segment Diameter Comment  1. Origin 5.62mm   2. High Thigh 2.42mm Multiple branches  3. Mid Thigh 3.49mm   4. Low Thigh 2.65mm   5. At Knee 4.78mm Branch 3.5 mm  6. High Calf 2.39mm   7. Low Calf 2.85mm   8. Ankle mm Not visible              Measurements on the right side may actually be a collateral vein since the greater saphenous vein was to have been harvested in the late 1990's for a previous 6 vessel bypass. The vein imaged is compressible, thrombus free, and adequate in size. However the walls appear slightly thickened throughout.  VASCULAR LAB PRELIMINARY  PRELIMINARY  PRELIMINARY  PRELIMINARY  Left Lower Extremity Vein Map    Left Great Saphenous Vein   Segment Diameter Comment  1. Origin 5.32mm   2. High Thigh 4.5mm   3. Mid Thigh 3.59mm   4. Low Thigh 5.65mm   5. At Knee 3.56mm   6. High Calf 3.61mm   7. Low Calf 3.85mm   8. Ankle 2.38mm                                                    Left greater saphenous vein is patent, compressible, thrombus free, walls do not appear thickened, and appear adequate in size.   Chemere Steffler, RVS 03/20/2012, 7:19 PM

## 2012-03-21 ENCOUNTER — Encounter (HOSPITAL_COMMUNITY): Payer: Self-pay | Admitting: Cardiology

## 2012-03-27 ENCOUNTER — Telehealth: Payer: Self-pay | Admitting: *Deleted

## 2012-03-27 NOTE — Telephone Encounter (Signed)
Noted incoming fax from [facility] to advised therapy duplication per pt medication list noted Rantidine HCL 75mg  bid, and Pantoprazole Sodium 40mg  one tablet daily, please advise

## 2012-04-01 NOTE — Telephone Encounter (Signed)
.  left message to have patient return my call to verify his choice between zantac and protonix

## 2012-04-01 NOTE — Telephone Encounter (Signed)
Renew one or the other. His preference.

## 2012-04-03 NOTE — Telephone Encounter (Signed)
.  left message to have patient return my call.  

## 2012-04-04 MED ORDER — PANTOPRAZOLE SODIUM 40 MG PO TBEC: 40.0000 mg | DELAYED_RELEASE_TABLET | Freq: Every day | ORAL | Status: DC

## 2012-04-04 NOTE — Addendum Note (Signed)
Addended by: Derry Lory A on: 04/04/2012 02:08 PM   Modules accepted: Orders

## 2012-04-04 NOTE — Telephone Encounter (Signed)
Pt clarified that he would like to continue taking the Protonix and stop taking the Zantac, advised that we will send out a refill the pronotix and discontinue the zantac in his chart, pt understood and will eliminate taking the zantac going forward, changes made in chart, Rx sent via escribe

## 2012-04-07 ENCOUNTER — Encounter: Payer: Self-pay | Admitting: Thoracic Surgery (Cardiothoracic Vascular Surgery)

## 2012-04-07 ENCOUNTER — Ambulatory Visit (INDEPENDENT_AMBULATORY_CARE_PROVIDER_SITE_OTHER): Payer: Medicare Other | Admitting: Thoracic Surgery (Cardiothoracic Vascular Surgery)

## 2012-04-07 VITALS — BP 110/59 | HR 62 | Resp 20 | Ht 68.0 in | Wt 242.0 lb

## 2012-04-07 DIAGNOSIS — I350 Nonrheumatic aortic (valve) stenosis: Secondary | ICD-10-CM

## 2012-04-07 DIAGNOSIS — I080 Rheumatic disorders of both mitral and aortic valves: Secondary | ICD-10-CM

## 2012-04-07 DIAGNOSIS — I050 Rheumatic mitral stenosis: Secondary | ICD-10-CM

## 2012-04-07 DIAGNOSIS — Z951 Presence of aortocoronary bypass graft: Secondary | ICD-10-CM

## 2012-04-07 DIAGNOSIS — I359 Nonrheumatic aortic valve disorder, unspecified: Secondary | ICD-10-CM

## 2012-04-07 DIAGNOSIS — I2510 Atherosclerotic heart disease of native coronary artery without angina pectoris: Secondary | ICD-10-CM | POA: Insufficient documentation

## 2012-04-07 NOTE — Patient Instructions (Signed)
Schedule catheterization with possible PCI by Dr Excell Seltzer

## 2012-04-07 NOTE — Progress Notes (Signed)
[address]            [name] [address]          [phone]     CARDIOTHORACIC SURGERY OFFICE NOTE  Referring Provider is Daleen Squibb, Jesse Sans, MD PCP is Crawford Givens, MD   HPI:  Patient returns for followup of aortic stenosis, mitral stenosis, coronary artery disease, and chronic diastolic congestive heart failure.  He was originally seen in consultation on 03/10/2012. Since then he underwent transesophageal echocardiogram by Dr. Shirlee Latch.  He returns to the office today to review the results of this test it is discuss treatment options. He reports essentially no significant change of the last few weeks. He states that he has good days and bad days. He has chronic shortness of breath with very mild activity. He's not having any chest pain or chest tightness either with activity or at rest. He is not having any dizzy spells nor syncope.   Current Outpatient Prescriptions  Medication Sig Dispense Refill  . aspirin 81 MG tablet Take 81 mg by mouth daily.        . clopidogrel (PLAVIX) 75 MG tablet Take 1 tablet (75 mg total) by mouth daily.  30 tablet  5  . co-enzyme Q-10 30 MG capsule Take 30 mg by mouth daily.        Marland Kitchen ezetimibe (ZETIA) 10 MG tablet Take 1 tablet (10 mg total) by mouth daily.  30 tablet  5  . fenofibrate micronized (ANTARA) 130 MG capsule Take 1 capsule (130 mg total) by mouth daily before breakfast.  30 capsule  5  . fish oil-omega-3 fatty acids 1000 MG capsule Take 2 g by mouth 2 (two) times daily.        . furosemide (LASIX) 80 MG tablet Take 1 tablet (80 mg total) by mouth daily.  30 tablet  5  . insulin glargine (LANTUS) 100 UNIT/ML injection Inject 90 Units into the skin 3 (three) times daily.        Marland Kitchen LOVAZA 1 G capsule Take 2 g by mouth 2 (two) times daily.       . metFORMIN (GLUCOPHAGE) 1000 MG tablet Take 1,000 mg by mouth 2 (two) times daily with a meal.        . metoprolol (LOPRESSOR) 50 MG tablet Take 1 tablet (50 mg total) by  mouth 2 (two) times daily.  60 tablet  5  . nitroGLYCERIN (NITROSTAT) 0.4 MG SL tablet Place 1 tablet (0.4 mg total) under the tongue every 5 (five) minutes as needed for chest pain. May repeat up to 3 times as needed  30 tablet  5  . pantoprazole (PROTONIX) 40 MG tablet Take 1 tablet (40 mg total) by mouth daily.  90 tablet  0  . ramipril (ALTACE) 10 MG tablet Take 1 tablet (10 mg total) by mouth daily.  30 tablet  5  . repaglinide (PRANDIN) 2 MG tablet Take 2 mg by mouth 3 (three) times daily before meals.        . rosuvastatin (CRESTOR) 20 MG tablet Take 1 tablet (20 mg total) by mouth at bedtime.  30 tablet  6  . sertraline (ZOLOFT) 50 MG tablet Take 1 tablet (50 mg total) by mouth daily.  90 tablet  0  . Vitamin D, Ergocalciferol, (DRISDOL) 50000 UNITS CAPS Take 50,000 Units by mouth every 7 (seven) days.        . [  DISCONTINUED] potassium chloride (KLOR-CON) 20 MEQ packet Take 20 mEq by mouth daily.        . [DISCONTINUED] promethazine (PHENERGAN) 25 MG tablet Take 1/2 to 1 tablet up to every 6 hours as needed for nausea.           Physical Exam:   BP 110/59  Pulse 62  Resp 20  Ht 5\' 8"  (1.727 m)  Wt 242 lb (109.77 kg)  BMI 36.80 kg/m2  SpO2 94%  General:  obese  Chest:   clear  CV:   RRR w/ systolic murmur  Incisions:  n/a  Abdomen:  soft  Extremities:  Warm and swollen  Diagnostic Tests:  Transesophageal Echocardiography  Patient: [name], [name] MR #: [id number] Study Date: 03/20/2012 Gender: M Age: 72 Height: 180.3cm Weight: 113.6kg BSA: 2.74m^2 Pt. Status: Room: [facility]  ADMITTING Marca Ancona ATTENDING Shirlee Latch, Dalton Anson Fret, Dalton PERFORMING Shirlee Latch, Dalton Darin Engels, Dalton SONOGRAPHER Dewitt Hoes, RDCS cc:  ------------------------------------------------------------ LV EF: 55%  ------------------------------------------------------------ Indications: Aortic stenosis 424.1. Mitral stenosis [non-rheumatic]  424.0.  ------------------------------------------------------------ Study Conclusions  - Left ventricle: The cavity size was normal. Wall thickness was increased in a pattern of mild LVH. The estimated ejection fraction was 55%. Images were inadequate for LV wall motion assessment. - Aortic valve: The aortic valve was probably trileaflet with severe calcification. Leaflet excursion was severely restricted suggesting severe aortic stenosis. Unable to obtain an accurate gradient reading by TEE (see prior TTE). No aortic insufficiency. - Mitral valve: The mitral valve and annulus were severe calcified with moderate to severe stenosis (mean gradient 9 mmHg and MVA 0.9 cm^2) and mild mitral regurgitation. Mean gradient:24mm Hg (D). Peak gradient: 23mm Hg (D). Valve area by pressure half-time: 0.96cm^2. - Left atrium: The atrium was mildly dilated. No evidence of thrombus in the atrial cavity or appendage. - Right ventricle: The cavity size was mildly dilated. Systolic function was mildly to moderately reduced. - Right atrium: The atrium was mildly dilated. - Atrial septum: No defect or patent foramen ovale was identified. Echo contrast study showed no right-to-left atrial level shunt, at baseline or with provocation. - Tricuspid valve: Trivial regurgitation. Transesophageal echocardiography. 2D and color Doppler. Height: Height: 180.3cm. Height: 71in. Weight: Weight: 113.6kg. Weight: 250lb. Body mass index: BMI: 34.9kg/m^2. Body surface area: BSA: 2.17m^2. Blood pressure: 152/70. Patient status: Outpatient. Location: Endoscopy.  ------------------------------------------------------------  ------------------------------------------------------------ Left ventricle: The cavity size was normal. Wall thickness was increased in a pattern of mild LVH. The estimated ejection fraction was 55%. Images were inadequate for LV wall motion  assessment.  ------------------------------------------------------------ Aortic valve: The aortic valve was probably trileaflet with severe calcification. Leaflet excursion was severely restricted suggesting severe aortic stenosis. Unable to obtain an accurate gradient reading by TEE (see prior TTE). No aortic insufficiency.  ------------------------------------------------------------ Mitral valve: The mitral valve and annulus were severe calcified with moderate to severe stenosis (mean gradient 9 mmHg and MVA 0.9 cm^2) and mild mitral regurgitation. Doppler: Valve area by pressure half-time: 0.96cm^2. Indexed valve area by pressure half-time: 0.4cm^2/m^2. Mean gradient:67mm Hg (D). Peak gradient: 23mm Hg (D).  ------------------------------------------------------------ Left atrium: The atrium was mildly dilated. No evidence of thrombus in the atrial cavity or appendage.  ------------------------------------------------------------ Atrial septum: No defect or patent foramen ovale was identified. Echo contrast study showed no right-to-left atrial level shunt, at baseline or with provocation.  ------------------------------------------------------------ Right ventricle: The cavity size was mildly dilated. Systolic function was mildly to moderately reduced.  ------------------------------------------------------------ Pulmonic valve: Structurally normal valve. Cusp separation  was normal.  ------------------------------------------------------------ Tricuspid valve: Doppler: Trivial regurgitation.  ------------------------------------------------------------ Right atrium: The atrium was mildly dilated.  ------------------------------------------------------------  2D measurements Normal Doppler measurements Norma LVOT l Diam, S 20 mm ------ Mitral valve Area 3.14 cm^2 ------ Mean vel, 134 cm/s ----- Aorta D AAo AP diam, 33 mm ------ Pressure 230 ms ----- S  half-time Mean 8 mm Hg ----- gradient, D Peak 23 mm Hg ----- gradient, D Area 0.9 cm^2 ----- (PHT) 6 Area 0.4 cm^2/m^2 ----- index (PHT) Annulus 91. cm ----- VTI 1  ------------------------------------------------------------ Prepared and Electronically Authenticated by  Marca Ancona 2014-01-16T21:53:27.030    Impression:  The patient has complex cardiac problems with severe aortic stenosis, probably moderate mitral stenosis if not severe mitral stenosis, severe three-vessel coronary artery disease with high-grade right coronary artery stenosis, status post coronary artery bypass grafting with patency of grafts to left anterior descending coronary artery and the left circumflex territories but chronic occlusion of graft to right coronary artery care poorly, extremely diffuse distal coronary artery disease with very poor target vessels for grafting, severe pulmonary hypertension, obesity, sleep apnea, chronic respiratory failure, and severe chronic venous insufficiency.  Conventional surgery for aortic valve replacement, mitral valve replacement, redo coronary artery bypass grafting, with or without tricuspid valve repair would come with very high-risk. Due to the presence of mitral stenosis is not clear whether or not transcatheter aortic valve replacement might represent less invasive alternative strategy could be helpful. However, the mean gradient across the mitral valve was measured only 9 mm mercury on recent transesophageal echocardiogram, although valve area was estimated just less than 1 cm using planimetry.    Plan:  I discussed matters at length with the patient and his wife in the office today. We spent in excess of 45 minutes discussing treatment alternatives.  I have reviewed his case at length with Dr. Excell Seltzer as well as colleagues at [facility] and the [facility] [place]. We have discussed the possibility that the direct measurement of the  transvalvular gradient across the mitral valve might be helpful to characterize how functionally significant the patient's mitral stenosis is at this time. If the patient's mitral stenosis is not felt to be severe it is conceivable that transcatheter aortic valve replacement might be a reasonable alternative to conventional surgery, either with or without an attempt at balloon valvuloplasty of the mitral valve.  The high-grade stenosis of the right coronary artery appears potentially amenable to percutaneous coronary intervention using a bare-metal stent. The patient is very interested in this alternative and hopes to avoid extremely high-risk surgery if possible. We will contact Dr. Excell Seltzer to facilitate scheduling repeat catheterization with possible PCI in the near future. All his questions been addressed.   Salvatore Decent. Cornelius Moras, MD 04/07/2012 7:13 PM

## 2012-04-23 ENCOUNTER — Ambulatory Visit (INDEPENDENT_AMBULATORY_CARE_PROVIDER_SITE_OTHER): Payer: Medicare Other | Admitting: Cardiovascular Disease

## 2012-04-23 ENCOUNTER — Encounter: Payer: Self-pay | Admitting: Cardiovascular Disease

## 2012-04-23 VITALS — BP 130/60 | HR 64 | Ht 70.0 in | Wt 251.0 lb

## 2012-04-23 DIAGNOSIS — I2510 Atherosclerotic heart disease of native coronary artery without angina pectoris: Secondary | ICD-10-CM

## 2012-04-23 DIAGNOSIS — I359 Nonrheumatic aortic valve disorder, unspecified: Secondary | ICD-10-CM

## 2012-04-23 LAB — CBC WITH DIFFERENTIAL/PLATELET
Basophils Absolute: 0.1 10*3/uL (ref 0.0–0.1)
Eosinophils Absolute: 0 10*3/uL (ref 0.0–0.7)
Hemoglobin: 11.8 g/dL — ABNORMAL LOW (ref 13.0–17.0)
Lymphocytes Relative: 13 % (ref 12.0–46.0)
MCHC: 33.2 g/dL (ref 30.0–36.0)
Monocytes Relative: 7.2 % (ref 3.0–12.0)
Neutrophils Relative %: 79 % — ABNORMAL HIGH (ref 43.0–77.0)
Platelets: 254 10*3/uL (ref 150.0–400.0)
RDW: 18.7 % — ABNORMAL HIGH (ref 11.5–14.6)

## 2012-04-23 LAB — BASIC METABOLIC PANEL
BUN: 26 mg/dL — ABNORMAL HIGH (ref 6–23)
Calcium: 9.5 mg/dL (ref 8.4–10.5)
Creatinine, Ser: 1.8 mg/dL — ABNORMAL HIGH (ref 0.4–1.5)
GFR: 41.21 mL/min — ABNORMAL LOW (ref >60.00)
Glucose, Bld: 156 mg/dL — ABNORMAL HIGH (ref 70–99)
Sodium: 138 mEq/L (ref 135–145)

## 2012-04-23 LAB — PROTIME-INR: Prothrombin Time: 14.1 s — ABNORMAL HIGH (ref 10.2–12.4)

## 2012-04-23 NOTE — Patient Instructions (Addendum)
Your physician recommends that you have lab work today: BMP, CBC, PT/INR  You are scheduled for a Valvuloplasty on 04/28/12.  Please follow instruction sheet.   Your physician recommends that you continue on your current medications as directed. Please refer to the Current Medication list given to you today.

## 2012-04-24 ENCOUNTER — Encounter (HOSPITAL_COMMUNITY): Payer: Self-pay | Admitting: Respiratory Therapy

## 2012-04-25 ENCOUNTER — Encounter (HOSPITAL_COMMUNITY): Payer: Self-pay | Admitting: *Deleted

## 2012-04-25 ENCOUNTER — Encounter: Payer: Self-pay | Admitting: Cardiovascular Disease

## 2012-04-25 ENCOUNTER — Other Ambulatory Visit: Payer: Self-pay | Admitting: Cardiovascular Disease

## 2012-04-25 NOTE — Progress Notes (Signed)
 HPI:  [name] [name] returns for followup. He is a 72-year-old gentleman with complex cardiac disease. His problems include coronary artery disease with prior CABG. His right coronary artery graft is occluded in the native RCA has a heavily calcified 95% lesion. The patient also has rheumatic heart disease with severe aortic stenosis, moderate to severe mitral stenosis, and severe pulmonary hypertension. He has recently undergone cardiac catheterization and transesophageal echo. He's been evaluated by Dr Owen with cardiac surgery. We have discussed this case extensively. He returns today for further discussion of treatment options.  The patient continues to have shortness of breath with exertion. At times he has resting dyspnea. He states that he has "good days and bad days." He denies chest pain or pressure. He has chronic leg swelling with pain in his legs when he ambulates. He denies syncope.  Outpatient Encounter Prescriptions as of 04/23/2012  Medication Sig Dispense Refill  . aspirin 81 MG tablet Take 81 mg by mouth daily.        . clopidogrel (PLAVIX) 75 MG tablet Take 1 tablet (75 mg total) by mouth daily.  30 tablet  5  . co-enzyme Q-10 30 MG capsule Take 30 mg by mouth daily.       . ezetimibe (ZETIA) 10 MG tablet Take 1 tablet (10 mg total) by mouth daily.  30 tablet  5  . fenofibrate micronized (ANTARA) 130 MG capsule Take 1 capsule (130 mg total) by mouth daily before breakfast.  30 capsule  5  . fish oil-omega-3 fatty acids 1000 MG capsule Take 2 g by mouth 2 (two) times daily.        . furosemide (LASIX) 80 MG tablet Take 1 tablet (80 mg total) by mouth daily.  30 tablet  5  . insulin glargine (LANTUS) 100 UNIT/ML injection Inject 80 Units into the skin 3 (three) times daily.       . metFORMIN (GLUCOPHAGE) 1000 MG tablet Take 1,000 mg by mouth 2 (two) times daily with a meal.        . metoprolol (LOPRESSOR) 50 MG tablet Take 1 tablet (50 mg total) by mouth 2 (two) times daily.  60 tablet   5  . nitroGLYCERIN (NITROSTAT) 0.4 MG SL tablet Place 1 tablet (0.4 mg total) under the tongue every 5 (five) minutes as needed for chest pain. May repeat up to 3 times as needed  30 tablet  5  . pantoprazole (PROTONIX) 40 MG tablet Take 1 tablet (40 mg total) by mouth daily.  90 tablet  0  . ramipril (ALTACE) 10 MG tablet Take 1 tablet (10 mg total) by mouth daily.  30 tablet  5  . repaglinide (PRANDIN) 2 MG tablet Take 2 mg by mouth 3 (three) times daily before meals.        . rosuvastatin (CRESTOR) 20 MG tablet Take 1 tablet (20 mg total) by mouth at bedtime.  30 tablet  6  . sertraline (ZOLOFT) 50 MG tablet Take 1 tablet (50 mg total) by mouth daily.  90 tablet  0  . Vitamin D, Ergocalciferol, (DRISDOL) 50000 UNITS CAPS Take 50,000 Units by mouth every 7 (seven) days.        . [DISCONTINUED] LOVAZA 1 G capsule Take 2 g by mouth 2 (two) times daily.        No facility-administered encounter medications on file as of 04/23/2012.    Allergies  Allergen Reactions  . Adhesive (Tape) Rash  . Celecoxib Rash      Past Medical History  Diagnosis Date  . GERD (gastroesophageal reflux disease)   . Hyperlipidemia   . Hypertension   . Diabetes mellitus   . Carotid artery disease   . Cellulitis and abscess of leg, except foot   . Leg edema   . Right knee sprain   . Cramps, muscle, general   . Low back pain   . Hiatal hernia   . Chills   . Hearing loss   . Nasal congestion   . Cough   . Wheezing   . Generalized headaches   . 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 Mitral stenosis 03/10/2012  . Chronic diastolic congestive heart failure 03/10/2012  . Aortic stenosis 03/10/2012  . Pulmonary hypertension 03/10/2012  . Venous insufficiency (chronic) (peripheral) 03/10/2012  . CAD, ARTERY BYPASS GRAFT 06/24/2008    Qualifier: Diagnosis of  By: Frazier, RMA, Sherri    . Sleep apnea     no cpap   sleep study >5 yrs    ROS: Negative except as per HPI  BP 130/60  Pulse 64  Ht 5' 10" (1.778 m)  Wt 113.853 kg (251 lb)  BMI 36.01 kg/m2  PHYSICAL EXAM: Pt is alert and oriented, obese male in NAD HEENT: normal Neck: JVP - difficult to visualize, carotids 2+= with bilateral bruits Lungs: CTA bilaterally CV: RRR with grade 3/6 systolic harsh crescendo decrescendo murmur at the RUSB Abd: soft, NT, obese Ext: diffuse 1+ pretibial edema, tender throughout, multiple anterior leg ulcers are covered  2D ECHO: Left ventricle: The cavity size was normal. Wall thickness was increased in a pattern of moderate LVH. There was severe asymmetric hypertrophy of the septum. Systolic function was vigorous. The estimated ejection fraction was in the range of 65% to 70%. The study is not technically sufficient to allow evaluation of LV diastolic function.  ------------------------------------------------------------ Aortic valve: Trileaflet; moderately thickened, moderately calcified leaflets. Cusp separation was moderately reduced. Doppler: There was moderate to severe stenosis - more in moderate range by planimetry. No significant regurgitation. VTI ratio of LVOT to aortic valve: 0.37. Valve area: 1.18cm^2(VTI). Indexed valve area: 0.51cm^2/m^2 (VTI). Peak velocity ratio of LVOT to aortic valve: 0.33. Valve area: 1.05cm^2 (Vmax). Indexed valve area: 0.45cm^2/m^2 (Vmax). Mean gradient: 36mm Hg (S). Peak gradient: 54mm Hg (S).  ------------------------------------------------------------ Aorta: Aortic root: The aortic root was mildly ectatic.  ------------------------------------------------------------ Mitral valve: Moderately calcified annulus. Severely thickened, moderately calcified leaflets . Mobility was severely restricted. Doppler: The findings are consistent with moderate to severe stenosis. Trivial regurgitation. Valve area by pressure half-time: 1.47cm^2. Indexed valve area by  pressure half-time: 0.64cm^2/m^2. Valve area by continuity equation (using LVOT flow): 0.92cm^2. Indexed valve area by continuity equation (using LVOT flow): 0.4cm^2/m^2. Mean gradient: 13mm Hg (D). Peak gradient: 36mm Hg (D).  ------------------------------------------------------------ Left atrium: The atrium was moderately dilated.  ------------------------------------------------------------ Right ventricle: The cavity size was moderately dilated. Systolic function was moderately reduced.  ------------------------------------------------------------ Pulmonic valve: The valve appears to be grossly normal. Doppler: Trivial regurgitation.  ------------------------------------------------------------ Pulmonary artery: Systolic pressure could not be accurately estimated.  ------------------------------------------------------------ Right atrium: The atrium was mildly dilated.  ------------------------------------------------------------ Pericardium: There was no pericardial effusion.  ------------------------------------------------------------ Systemic veins: Inferior vena cava: The vessel was dilated; the respirophasic diameter changes were blunted (< 50%); findings are consistent with elevated central venous pressure.  ------------------------------------------------------------  2D measurements Normal   Doppler measurements Normal Left ventricle LVOT LVID ED, 45.4 mm 43-52 Peak vel, 122 cm/s ------ chord, S PLAX VTI, S 34.1 cm ------ LVID ES, 36.4 mm 23-38 Peak 6 mm Hg ------ chord, gradient, PLAX S FS, 20 % >29 Aortic valve chord, Peak vel, 366 cm/s ------ PLAX S LVPW, ED 16.57 mm ------ Mean vel, 281 cm/s ------ IVS/LVPW 1.26 <1.3 S ratio, ED VTI, S 91.1 cm ------ Ventricular septum Mean 36 mm Hg ------ IVS, ED 20.83 mm ------ gradient, LVOT S Diam, S 20 mm ------ Peak 54 mm Hg ------ Area 3.14 cm^2 ------ gradient, Aorta S Root 38 mm ------ VTI ratio 0.37  ------ diam, ED LVOT/AV Left atrium Area, VTI 1.18 cm^2 ------ AP dim 62 mm ------ Area index 0.51 cm^2/m ------ AP dim 2.68 cm/m^2 <2.2 (VTI) ^2 index Peak vel 0.33 ------ ratio, M-mode measurements Normal LVOT/AV Aorta Area, Vmax 1.05 cm^2 ------ Root 32 mm 20-37 Area index 0.45 cm^2/m ------ diam, ED (Vmax) ^2 Left atrium Mitral valve AP dim, 60 mm 19-40 Peak E vel 281 cm/s ------ ES Peak A vel 152 cm/s ------ AP dim 2.6 cm/m^2 <2.2 Mean vel, 160 cm/s ------ index, ES D LA/Ao 1.88 ------ Decelerati 401 ms 150-23 root on time 0 ratio Pressure 150 ms ------ half-time Mean 13 mm Hg ------ gradient, D Peak 36 mm Hg ------ gradient, D Peak E/A 1.8 ------ ratio Area (PHT) 1.47 cm^2 ------ Area index 0.64 cm^2/m ------ (PHT) ^2 Area 0.92 cm^2 ------ (LVOT) continuity Area index 0.4 cm^2/m ------ (LVOT ^2 cont) Annulus 116 cm ------ VTI Regurg 38.5 cm/s ------ alias vel, PISA Max regurg 607 cm/s ------ vel Regurg VTI 205 cm ------ ERO, PISA 0.06 cm^2 ------ Regurg 12 ml ------ vol, PISA Systemic veins Estimated 15 mm Hg ------ CVP  TEE: Study Conclusions  - Left ventricle: The cavity size was normal. Wall thickness was increased in a pattern of mild LVH. The estimated ejection fraction was 55%. Images were inadequate for LV wall motion assessment. - Aortic valve: The aortic valve was probably trileaflet with severe calcification. Leaflet excursion was severely restricted suggesting severe aortic stenosis. Unable to obtain an accurate gradient reading by TEE (see prior TTE). No aortic insufficiency. - Mitral valve: The mitral valve and annulus were severe calcified with moderate to severe stenosis (mean gradient 9 mmHg and MVA 0.9 cm^2) and mild mitral regurgitation. Mean gradient:9mm Hg (D). Peak gradient: 23mm Hg (D). Valve area by pressure half-time: 0.96cm^2. - Left atrium: The atrium was mildly dilated. No evidence of thrombus in the atrial cavity  or appendage. - Right ventricle: The cavity size was mildly dilated. Systolic function was mildly to moderately reduced. - Right atrium: The atrium was mildly dilated. - Atrial septum: No defect or patent foramen ovale was identified. Echo contrast study showed no right-to-left atrial level shunt, at baseline or with provocation. - Tricuspid valve: Trivial regurgitation.  ASSESSMENT AND PLAN: 72-year-old gentleman with severe aortic stenosis, severe pulmonary hypertension, moderate to severe mitral stenosis, and CAD status post CABG with severe RCA stenosis.  Treatment options were again reviewed at length with the patient and his wife. Considerations include extremely high risk cardiac surgery, percutaneous stenting of his right coronary artery and TAVR for treatment of his aortic stenosis, versus continued palliative medical therapy. The patient would like to pursue treatment of his heart disease for both symptomatic improvement and potential survival benefit. He hopes to avoid high-risk surgery.   I have reviewed his cardiac cath films   again. He has a heavily calcified right coronary artery that would be best treated with rotational atherectomy and stenting. I am very reluctant to perform rotational atherectomy in this gentleman with severe aortic stenosis and severe pulmonary hypertension with RV dysfunction. I think the best way to proceed is with balloon aortic valvuloplasty followed by rotational atherectomy and stenting of the RCA. It is most prudent to try to reduce the patient's aortic valve gradient before proceeding with other treatments. If he does well with balloon valvuloplasty, will consider PCI and potentially transseptal puncture for direct measurement of his transmitral gradient as a second procedure. Depending on his clinical response to these measures, he may ultimately be a candidate for TAVR.  I have reviewed the risks, indications, and alternatives to balloon aortic  valvuloplasty with the patient and his wife. They understand the specific risks of vascular injury, stroke, myocardial infarction, cardiac perforation, and death. They also understand that this patient's risks are increased because he has multiple cardiac problems as outlined above.   Elyon Zoll 04/25/2012 11:08 PM      

## 2012-04-28 ENCOUNTER — Encounter (HOSPITAL_COMMUNITY)
Admission: RE | Disposition: A | Discharge status: Home / Self Care | Payer: Self-pay | Source: Ambulatory Visit | Attending: Cardiovascular Disease

## 2012-04-28 ENCOUNTER — Ambulatory Visit (HOSPITAL_COMMUNITY)
Admission: RE | Admit: 2012-04-28 | Discharge: 2012-04-29 | Disposition: A | Discharge status: Home / Self Care | Payer: Medicare Other | Source: Ambulatory Visit | Attending: Cardiovascular Disease | Admitting: Cardiovascular Disease

## 2012-04-28 ENCOUNTER — Encounter (HOSPITAL_COMMUNITY): Payer: Self-pay | Admitting: *Deleted

## 2012-04-28 ENCOUNTER — Inpatient Hospital Stay (HOSPITAL_COMMUNITY): Payer: Medicare Other | Admitting: Anesthesiology

## 2012-04-28 ENCOUNTER — Encounter (HOSPITAL_COMMUNITY): Payer: Self-pay | Admitting: Anesthesiology

## 2012-04-28 DIAGNOSIS — I25810 Atherosclerosis of coronary artery bypass graft(s) without angina pectoris: Secondary | ICD-10-CM | POA: Diagnosis present

## 2012-04-28 DIAGNOSIS — I5032 Chronic diastolic (congestive) heart failure: Secondary | ICD-10-CM | POA: Diagnosis present

## 2012-04-28 DIAGNOSIS — I272 Pulmonary hypertension, unspecified: Secondary | ICD-10-CM | POA: Diagnosis present

## 2012-04-28 DIAGNOSIS — I2789 Other specified pulmonary heart diseases: Secondary | ICD-10-CM | POA: Insufficient documentation

## 2012-04-28 DIAGNOSIS — I70209 Unspecified atherosclerosis of native arteries of extremities, unspecified extremity: Secondary | ICD-10-CM | POA: Diagnosis present

## 2012-04-28 DIAGNOSIS — Z951 Presence of aortocoronary bypass graft: Secondary | ICD-10-CM | POA: Insufficient documentation

## 2012-04-28 DIAGNOSIS — K219 Gastro-esophageal reflux disease without esophagitis: Secondary | ICD-10-CM | POA: Diagnosis present

## 2012-04-28 DIAGNOSIS — I509 Heart failure, unspecified: Secondary | ICD-10-CM | POA: Insufficient documentation

## 2012-04-28 DIAGNOSIS — E785 Hyperlipidemia, unspecified: Secondary | ICD-10-CM | POA: Diagnosis present

## 2012-04-28 DIAGNOSIS — I350 Nonrheumatic aortic (valve) stenosis: Secondary | ICD-10-CM

## 2012-04-28 DIAGNOSIS — I359 Nonrheumatic aortic valve disorder, unspecified: Secondary | ICD-10-CM

## 2012-04-28 DIAGNOSIS — I080 Rheumatic disorders of both mitral and aortic valves: Principal | ICD-10-CM | POA: Diagnosis present

## 2012-04-28 DIAGNOSIS — E119 Type 2 diabetes mellitus without complications: Secondary | ICD-10-CM | POA: Insufficient documentation

## 2012-04-28 DIAGNOSIS — Z9889 Other specified postprocedural states: Secondary | ICD-10-CM

## 2012-04-28 DIAGNOSIS — E1149 Type 2 diabetes mellitus with other diabetic neurological complication: Secondary | ICD-10-CM | POA: Diagnosis present

## 2012-04-28 DIAGNOSIS — I872 Venous insufficiency (chronic) (peripheral): Secondary | ICD-10-CM | POA: Diagnosis present

## 2012-04-28 DIAGNOSIS — I10 Essential (primary) hypertension: Secondary | ICD-10-CM | POA: Insufficient documentation

## 2012-04-28 LAB — SURGICAL PCR SCREEN
MRSA, PCR: NEGATIVE
Staphylococcus aureus: POSITIVE — AB

## 2012-04-28 LAB — GLUCOSE, CAPILLARY
Glucose-Capillary: 190 mg/dL — ABNORMAL HIGH (ref 70–99)
Glucose-Capillary: 173 mg/dL — ABNORMAL HIGH (ref 70–99)
Glucose-Capillary: 144 mg/dL — ABNORMAL HIGH (ref 70–99)

## 2012-04-28 LAB — POCT ACTIVATED CLOTTING TIME: Activated Clotting Time: 208 seconds

## 2012-04-28 SURGERY — VALVULOPLASTY, AORTIC VALVE, USING BALLOON
Anesthesia: Monitor Anesthesia Care | Wound class: Clean

## 2012-04-28 MED ORDER — DEXTROSE 5 % IV SOLN
3.0000 g | Freq: Once | INTRAVENOUS | Status: AC
  Administered 2012-04-28: 3 g via INTRAVENOUS
  Filled 2012-04-28: qty 3000

## 2012-04-28 MED ORDER — RAMIPRIL 10 MG PO CAPS
10.0000 mg | ORAL_CAPSULE | Freq: Every day | ORAL | Status: DC
  Administered 2012-04-29: 10 mg via ORAL
  Filled 2012-04-28 (×2): qty 1

## 2012-04-28 MED ORDER — PANTOPRAZOLE SODIUM 40 MG PO TBEC
40.0000 mg | DELAYED_RELEASE_TABLET | Freq: Every day | ORAL | Status: DC
  Administered 2012-04-29: 40 mg via ORAL
  Filled 2012-04-28: qty 1

## 2012-04-28 MED ORDER — FUROSEMIDE 80 MG PO TABS
80.0000 mg | ORAL_TABLET | Freq: Every day | ORAL | Status: DC
Start: 2012-04-28 — End: 2012-04-29
  Administered 2012-04-28: 80 mg via ORAL
  Filled 2012-04-28 (×2): qty 1

## 2012-04-28 MED ORDER — SODIUM CHLORIDE 0.9 % IV SOLN: 250.0000 mL | INTRAVENOUS | Status: DC | PRN

## 2012-04-28 MED ORDER — OXYCODONE-ACETAMINOPHEN 5-325 MG PO TABS
1.0000 | ORAL_TABLET | ORAL | Status: DC | PRN
  Administered 2012-04-29: 2 via ORAL
  Administered 2012-04-29: 1 via ORAL
  Filled 2012-04-28: qty 1
  Filled 2012-04-28: qty 2

## 2012-04-28 MED ORDER — SODIUM CHLORIDE 0.9 % IV SOLN: INTRAVENOUS | Status: DC

## 2012-04-28 MED ORDER — ATORVASTATIN CALCIUM 20 MG PO TABS
20.0000 mg | ORAL_TABLET | Freq: Every day | ORAL | Status: DC
  Administered 2012-04-28: 20 mg via ORAL
  Filled 2012-04-28 (×2): qty 1

## 2012-04-28 MED ORDER — INSULIN GLARGINE 100 UNIT/ML ~~LOC~~ SOLN
80.0000 [IU] | Freq: Two times a day (BID) | SUBCUTANEOUS | Status: DC
  Administered 2012-04-28 – 2012-04-29 (×2): 80 [IU] via SUBCUTANEOUS

## 2012-04-28 MED ORDER — INSULIN GLARGINE 100 UNIT/ML ~~LOC~~ SOLN: 80.0000 [IU] | Freq: Three times a day (TID) | SUBCUTANEOUS | Status: DC

## 2012-04-28 MED ORDER — SODIUM CHLORIDE 0.9 % IV SOLN
INTRAVENOUS | Status: AC
  Administered 2012-04-28: 12:00:00 via INTRAVENOUS

## 2012-04-28 MED ORDER — SODIUM CHLORIDE 0.9 % IJ SOLN: 3.0000 mL | Freq: Two times a day (BID) | INTRAMUSCULAR | Status: DC

## 2012-04-28 MED ORDER — EZETIMIBE 10 MG PO TABS
10.0000 mg | ORAL_TABLET | Freq: Every day | ORAL | Status: DC
  Administered 2012-04-29: 10 mg via ORAL
  Filled 2012-04-28: qty 1

## 2012-04-28 MED ORDER — METOPROLOL TARTRATE 50 MG PO TABS
50.0000 mg | ORAL_TABLET | Freq: Two times a day (BID) | ORAL | Status: DC
  Administered 2012-04-28: 50 mg via ORAL
  Filled 2012-04-28 (×3): qty 1

## 2012-04-28 MED ORDER — DIAZEPAM 5 MG PO TABS: 5.0000 mg | ORAL_TABLET | ORAL | Status: DC

## 2012-04-28 MED ORDER — SODIUM CHLORIDE 0.9 % IJ SOLN: 3.0000 mL | INTRAMUSCULAR | Status: DC | PRN

## 2012-04-28 MED ORDER — NITROGLYCERIN 0.4 MG SL SUBL: 0.4000 mg | SUBLINGUAL_TABLET | SUBLINGUAL | Status: DC | PRN

## 2012-04-28 MED ORDER — PROTAMINE SULFATE 10 MG/ML IV SOLN
INTRAVENOUS | Status: DC | PRN
  Administered 2012-04-28: 50 mg via INTRAVENOUS

## 2012-04-28 MED ORDER — LIDOCAINE HCL (PF) 1 % IJ SOLN
INTRAMUSCULAR | Status: DC | PRN
  Administered 2012-04-28: 30 mL

## 2012-04-28 MED ORDER — LACTATED RINGERS IV SOLN
INTRAVENOUS | Status: DC | PRN
  Administered 2012-04-28: 07:00:00 via INTRAVENOUS

## 2012-04-28 MED ORDER — ASPIRIN 81 MG PO TABS: 81.0000 mg | ORAL_TABLET | Freq: Every day | ORAL | Status: DC

## 2012-04-28 MED ORDER — RAMIPRIL 10 MG PO TABS: 10.0000 mg | ORAL_TABLET | Freq: Every day | ORAL | Status: DC

## 2012-04-28 MED ORDER — FENTANYL CITRATE 0.05 MG/ML IJ SOLN
INTRAMUSCULAR | Status: DC | PRN
  Administered 2012-04-28 (×5): 25 ug via INTRAVENOUS

## 2012-04-28 MED ORDER — FENOFIBRATE 54 MG PO TABS
54.0000 mg | ORAL_TABLET | Freq: Every day | ORAL | Status: DC
  Administered 2012-04-29: 54 mg via ORAL
  Filled 2012-04-28: qty 1

## 2012-04-28 MED ORDER — ONDANSETRON HCL 4 MG/2ML IJ SOLN: 4.0000 mg | Freq: Four times a day (QID) | INTRAMUSCULAR | Status: DC | PRN

## 2012-04-28 MED ORDER — MIDAZOLAM HCL 5 MG/5ML IJ SOLN
INTRAMUSCULAR | Status: DC | PRN
  Administered 2012-04-28: 2 mg via INTRAVENOUS

## 2012-04-28 MED ORDER — HEPARIN SODIUM (PORCINE) 1000 UNIT/ML IJ SOLN
INTRAMUSCULAR | Status: DC | PRN
  Administered 2012-04-28: 6000 [IU] via INTRAVENOUS
  Administered 2012-04-28: 2000 [IU] via INTRAVENOUS

## 2012-04-28 MED ORDER — MUPIROCIN 2 % EX OINT
TOPICAL_OINTMENT | Freq: Two times a day (BID) | CUTANEOUS | Status: DC
  Administered 2012-04-28: 1 via NASAL
  Administered 2012-04-29 (×2): via NASAL
  Filled 2012-04-28 (×3): qty 22

## 2012-04-28 MED ORDER — CLOPIDOGREL BISULFATE 75 MG PO TABS
75.0000 mg | ORAL_TABLET | Freq: Every day | ORAL | Status: DC
  Administered 2012-04-28 – 2012-04-29 (×2): 75 mg via ORAL
  Filled 2012-04-28 (×2): qty 1

## 2012-04-28 MED ORDER — NOREPINEPHRINE BITARTRATE 1 MG/ML IJ SOLN
2.0000 ug/min | INTRAVENOUS | Status: DC
  Filled 2012-04-28: qty 4

## 2012-04-28 MED ORDER — ACETAMINOPHEN 325 MG PO TABS: 650.0000 mg | ORAL_TABLET | ORAL | Status: DC | PRN

## 2012-04-28 MED ORDER — SERTRALINE HCL 50 MG PO TABS
50.0000 mg | ORAL_TABLET | Freq: Every day | ORAL | Status: DC
  Administered 2012-04-29: 50 mg via ORAL
  Filled 2012-04-28: qty 1

## 2012-04-28 MED ORDER — IOHEXOL 350 MG/ML SOLN
INTRAVENOUS | Status: DC | PRN
  Administered 2012-04-28: 50 mL via INTRA_ARTERIAL

## 2012-04-28 MED ORDER — REPAGLINIDE 0.5 MG PO TABS
2.0000 mg | ORAL_TABLET | Freq: Three times a day (TID) | ORAL | Status: DC
  Administered 2012-04-28 – 2012-04-29 (×3): 2 mg via ORAL
  Filled 2012-04-28 (×6): qty 1

## 2012-04-28 MED ORDER — DEXMEDETOMIDINE HCL IN NACL 200 MCG/50ML IV SOLN
INTRAVENOUS | Status: DC | PRN
  Administered 2012-04-28: 0.3 ug/kg/h via INTRAVENOUS

## 2012-04-28 MED ORDER — ASPIRIN EC 81 MG PO TBEC
81.0000 mg | DELAYED_RELEASE_TABLET | Freq: Every day | ORAL | Status: DC
  Administered 2012-04-28: 81 mg via ORAL
  Filled 2012-04-28 (×2): qty 1

## 2012-04-28 SURGICAL SUPPLY — 38 items
72" ARTERIAL PRESSURE TUBING ×1 IMPLANT
AL1 - AMPLATZ LEFT 1 ×1 IMPLANT
AL2 - AMPLATZ LEFT 2 ×1 IMPLANT
ANGIOGRAPHIC GUIDEWIRES 3MM J X 145CM 0.035 ×1 IMPLANT
AVANTI 6FR SHEATH ×1 IMPLANT
BAG BANDED W/RUBBER/TAPE 36X54 (MISCELLANEOUS) ×2 IMPLANT
BAG EQP BAND 135X91 W/RBR TAPE (MISCELLANEOUS) ×2
COVER DOME SNAP 22 D (MISCELLANEOUS) ×2 IMPLANT
COVER PROBE W GEL 5X96 (DRAPES) ×1 IMPLANT
DEVICE CLOSURE PERCLS PRGLD 6F (VASCULAR PRODUCTS) IMPLANT
DUAL LUMEN CATHETER ×1 IMPLANT
EDWARDS BIPOLAR PACING CATHETER ×1 IMPLANT
GLOVE BIOGEL PI IND STRL 6.5 (GLOVE) IMPLANT
GLOVE BIOGEL PI IND STRL 7.0 (GLOVE) IMPLANT
GLOVE BIOGEL PI INDICATOR 6.5 (GLOVE) ×1
GLOVE BIOGEL PI INDICATOR 7.0 (GLOVE) ×1
GLOVE ECLIPSE 8.0 STRL XLNG CF (GLOVE) ×1 IMPLANT
GLOVE ORTHO TXT STRL SZ7.5 (GLOVE) ×1 IMPLANT
GLOVE SURG SS PI 6.5 STRL IVOR (GLOVE) ×1 IMPLANT
GOWN PREVENTION PLUS XLARGE (GOWN DISPOSABLE) ×1 IMPLANT
GOWN STRL NON-REIN LRG LVL3 (GOWN DISPOSABLE) ×2 IMPLANT
GUIDEWIRE SAFE TJ AMPLATZ EXST (WIRE) ×1 IMPLANT
MACROBORE EXTENSION SET 30" ×1 IMPLANT
NAMIC CONVENIENCE KIT ~~LOC~~ ~~LOC~~ HOSPITAL RIGHT HEART KIT ×1 IMPLANT
NAMIC CONVENIENCE KIT ~~LOC~~. ~~LOC~~ HOSPITAL LEFT HEART KIT ×1 IMPLANT
PAD ELECT DEFIB RADIOL ZOLL (MISCELLANEOUS) ×1 IMPLANT
PERCLOSE PROGLIDE 6F (VASCULAR PRODUCTS) ×6
SHEATH AVANTI 11CM 7FR (MISCELLANEOUS) ×1 IMPLANT
SPONGE GAUZE 4X4 12PLY (GAUZE/BANDAGES/DRESSINGS) ×1 IMPLANT
ST. JUDE MEDICAL FAST-CATH INTRODUCER SHEATH ×1 IMPLANT
SYR 3ML LL SCALE MARK (SYRINGE) ×3 IMPLANT
SYR 50ML LL SCALE MARK (SYRINGE) ×1 IMPLANT
SYR MEDRAD MARK V 150ML (SYRINGE) ×1 IMPLANT
SYRINGE 10CC LL (SYRINGE) ×3 IMPLANT
TAPE CLOTH SURG 4X10 WHT LF (GAUZE/BANDAGES/DRESSINGS) ×1 IMPLANT
TOWEL OR 17X26 4PK STRL BLUE (TOWEL DISPOSABLE) ×1 IMPLANT
TRANSPAC IV DISPOSABLE TRANSDUCER WITH STOPCOCKS ×2 IMPLANT
TRUE DILATATION BALLOON VALVULOPLASTY CATHETER ×1 IMPLANT

## 2012-04-28 NOTE — OR Nursing (Addendum)
Heparin 2,000 units per 1,087mL 0.9% Sodium Chloride used intraoperatively; 04/05/2013. 7mL of Omnipaque 350mg I/mL used intraoperatively; expires 01/04/2015.

## 2012-04-28 NOTE — H&P (View-Only) (Signed)
HPI:  [name] returns for followup. He is a 72 year old gentleman with complex cardiac disease. His problems include coronary artery disease with prior CABG. His right coronary artery graft is occluded in the native RCA has a heavily calcified 95% lesion. The patient also has rheumatic heart disease with severe aortic stenosis, moderate to severe mitral stenosis, and severe pulmonary hypertension. He has recently undergone cardiac catheterization and transesophageal echo. He's been evaluated by Dr Cornelius Moras with cardiac surgery. We have discussed this case extensively. He returns today for further discussion of treatment options.  The patient continues to have shortness of breath with exertion. At times he has resting dyspnea. He states that he has "good days and bad days." He denies chest pain or pressure. He has chronic leg swelling with pain in his legs when he ambulates. He denies syncope.  Outpatient Encounter Prescriptions as of 04/23/2012  Medication Sig Dispense Refill  . aspirin 81 MG tablet Take 81 mg by mouth daily.        . clopidogrel (PLAVIX) 75 MG tablet Take 1 tablet (75 mg total) by mouth daily.  30 tablet  5  . co-enzyme Q-10 30 MG capsule Take 30 mg by mouth daily.       Marland Kitchen ezetimibe (ZETIA) 10 MG tablet Take 1 tablet (10 mg total) by mouth daily.  30 tablet  5  . fenofibrate micronized (ANTARA) 130 MG capsule Take 1 capsule (130 mg total) by mouth daily before breakfast.  30 capsule  5  . fish oil-omega-3 fatty acids 1000 MG capsule Take 2 g by mouth 2 (two) times daily.        . furosemide (LASIX) 80 MG tablet Take 1 tablet (80 mg total) by mouth daily.  30 tablet  5  . insulin glargine (LANTUS) 100 UNIT/ML injection Inject 80 Units into the skin 3 (three) times daily.       . metFORMIN (GLUCOPHAGE) 1000 MG tablet Take 1,000 mg by mouth 2 (two) times daily with a meal.        . metoprolol (LOPRESSOR) 50 MG tablet Take 1 tablet (50 mg total) by mouth 2 (two) times daily.  60 tablet   5  . nitroGLYCERIN (NITROSTAT) 0.4 MG SL tablet Place 1 tablet (0.4 mg total) under the tongue every 5 (five) minutes as needed for chest pain. May repeat up to 3 times as needed  30 tablet  5  . pantoprazole (PROTONIX) 40 MG tablet Take 1 tablet (40 mg total) by mouth daily.  90 tablet  0  . ramipril (ALTACE) 10 MG tablet Take 1 tablet (10 mg total) by mouth daily.  30 tablet  5  . repaglinide (PRANDIN) 2 MG tablet Take 2 mg by mouth 3 (three) times daily before meals.        . rosuvastatin (CRESTOR) 20 MG tablet Take 1 tablet (20 mg total) by mouth at bedtime.  30 tablet  6  . sertraline (ZOLOFT) 50 MG tablet Take 1 tablet (50 mg total) by mouth daily.  90 tablet  0  . Vitamin D, Ergocalciferol, (DRISDOL) 50000 UNITS CAPS Take 50,000 Units by mouth every 7 (seven) days.        . [DISCONTINUED] LOVAZA 1 G capsule Take 2 g by mouth 2 (two) times daily.        No facility-administered encounter medications on file as of 04/23/2012.    Allergies  Allergen Reactions  . Adhesive (Tape) Rash  . Celecoxib Rash  Past Medical History  Diagnosis Date  . GERD (gastroesophageal reflux disease)   . Hyperlipidemia   . Hypertension   . Diabetes mellitus   . Carotid artery disease   . Cellulitis and abscess of leg, except [name] [name]   . Leg edema   . Right knee sprain   . Cramps, muscle, general   . Low back pain   . Hiatal hernia   . Chills   . Hearing loss   . Nasal congestion   . Cough   . Wheezing   . Generalized headaches   . 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 Mitral stenosis 03/10/2012  . Chronic diastolic congestive heart failure 03/10/2012  . Aortic stenosis 03/10/2012  . Pulmonary hypertension 03/10/2012  . Venous insufficiency (chronic) (peripheral) 03/10/2012  . CAD, ARTERY BYPASS GRAFT 06/24/2008    Qualifier: Diagnosis of  By: Bascom Levels, RMA, Sherri    . Sleep apnea     no cpap   sleep study >5 yrs    ROS: Negative except as per HPI  BP 130/60  Pulse 64  Ht 5\' 10"  (1.778 m)  Wt 113.853 kg (251 lb)  BMI 36.01 kg/m2  PHYSICAL EXAM: Pt is alert and oriented, obese male in NAD HEENT: normal Neck: JVP - difficult to visualize, carotids 2+= with bilateral bruits Lungs: CTA bilaterally CV: RRR with grade 3/6 systolic harsh crescendo decrescendo murmur at the RUSB Abd: soft, NT, obese Ext: diffuse 1+ pretibial edema, tender throughout, multiple anterior leg ulcers are covered  2D ECHO: Left ventricle: The cavity size was normal. Wall thickness was increased in a pattern of moderate LVH. There was severe asymmetric hypertrophy of the septum. Systolic function was vigorous. The estimated ejection fraction was in the range of 65% to 70%. The study is not technically sufficient to allow evaluation of LV diastolic function.  ------------------------------------------------------------ Aortic valve: Trileaflet; moderately thickened, moderately calcified leaflets. Cusp separation was moderately reduced. Doppler: There was moderate to severe stenosis - more in moderate range by planimetry. No significant regurgitation. VTI ratio of LVOT to aortic valve: 0.37. Valve area: 1.18cm^2(VTI). Indexed valve area: 0.51cm^2/m^2 (VTI). Peak velocity ratio of LVOT to aortic valve: 0.33. Valve area: 1.05cm^2 (Vmax). Indexed valve area: 0.45cm^2/m^2 (Vmax). Mean gradient: 36mm Hg (S). Peak gradient: 54mm Hg (S).  ------------------------------------------------------------ Aorta: Aortic root: The aortic root was mildly ectatic.  ------------------------------------------------------------ Mitral valve: Moderately calcified annulus. Severely thickened, moderately calcified leaflets . Mobility was severely restricted. Doppler: The findings are consistent with moderate to severe stenosis. Trivial regurgitation. Valve area by pressure half-time: 1.47cm^2. Indexed valve area by  pressure half-time: 0.64cm^2/m^2. Valve area by continuity equation (using LVOT flow): 0.92cm^2. Indexed valve area by continuity equation (using LVOT flow): 0.4cm^2/m^2. Mean gradient: 13mm Hg (D). Peak gradient: 36mm Hg (D).  ------------------------------------------------------------ Left atrium: The atrium was moderately dilated.  ------------------------------------------------------------ Right ventricle: The cavity size was moderately dilated. Systolic function was moderately reduced.  ------------------------------------------------------------ Pulmonic valve: The valve appears to be grossly normal. Doppler: Trivial regurgitation.  ------------------------------------------------------------ Pulmonary artery: Systolic pressure could not be accurately estimated.  ------------------------------------------------------------ Right atrium: The atrium was mildly dilated.  ------------------------------------------------------------ Pericardium: There was no pericardial effusion.  ------------------------------------------------------------ Systemic veins: Inferior vena cava: The vessel was dilated; the respirophasic diameter changes were blunted (< 50%); findings are consistent with elevated central venous pressure.  ------------------------------------------------------------  2D measurements Normal  Doppler measurements Normal Left ventricle LVOT LVID ED, 45.4 mm 43-52 Peak vel, 122 cm/s ------ chord, S PLAX VTI, S 34.1 cm ------ LVID ES, 36.4 mm 23-38 Peak 6 mm Hg ------ chord, gradient, PLAX S FS, 20 % >29 Aortic valve chord, Peak vel, 366 cm/s ------ PLAX S LVPW, ED 16.57 mm ------ Mean vel, 281 cm/s ------ IVS/LVPW 1.26 <1.3 S ratio, ED VTI, S 91.1 cm ------ Ventricular septum Mean 36 mm Hg ------ IVS, ED 20.83 mm ------ gradient, LVOT S Diam, S 20 mm ------ Peak 54 mm Hg ------ Area 3.14 cm^2 ------ gradient, Aorta S Root 38 mm ------ VTI ratio 0.37  ------ diam, ED LVOT/AV Left atrium Area, VTI 1.18 cm^2 ------ AP dim 62 mm ------ Area index 0.51 cm^2/m ------ AP dim 2.68 cm/m^2 <2.2 (VTI) ^2 index Peak vel 0.33 ------ ratio, M-mode measurements Normal LVOT/AV Aorta Area, Vmax 1.05 cm^2 ------ Root 32 mm 20-37 Area index 0.45 cm^2/m ------ diam, ED (Vmax) ^2 Left atrium Mitral valve AP dim, 60 mm 19-40 Peak E vel 281 cm/s ------ ES Peak A vel 152 cm/s ------ AP dim 2.6 cm/m^2 <2.2 Mean vel, 160 cm/s ------ index, ES D LA/Ao 1.88 ------ Decelerati 401 ms 150-23 root on time 0 ratio Pressure 150 ms ------ half-time Mean 13 mm Hg ------ gradient, D Peak 36 mm Hg ------ gradient, D Peak E/A 1.8 ------ ratio Area (PHT) 1.47 cm^2 ------ Area index 0.64 cm^2/m ------ (PHT) ^2 Area 0.92 cm^2 ------ (LVOT) continuity Area index 0.4 cm^2/m ------ (LVOT ^2 cont) Annulus 116 cm ------ VTI Regurg 38.5 cm/s ------ alias vel, PISA Max regurg 607 cm/s ------ vel Regurg VTI 205 cm ------ ERO, PISA 0.06 cm^2 ------ Regurg 12 ml ------ vol, PISA Systemic veins Estimated 15 mm Hg ------ CVP  TEE: Study Conclusions  - Left ventricle: The cavity size was normal. Wall thickness was increased in a pattern of mild LVH. The estimated ejection fraction was 55%. Images were inadequate for LV wall motion assessment. - Aortic valve: The aortic valve was probably trileaflet with severe calcification. Leaflet excursion was severely restricted suggesting severe aortic stenosis. Unable to obtain an accurate gradient reading by TEE (see prior TTE). No aortic insufficiency. - Mitral valve: The mitral valve and annulus were severe calcified with moderate to severe stenosis (mean gradient 9 mmHg and MVA 0.9 cm^2) and mild mitral regurgitation. Mean gradient:72mm Hg (D). Peak gradient: 23mm Hg (D). Valve area by pressure half-time: 0.96cm^2. - Left atrium: The atrium was mildly dilated. No evidence of thrombus in the atrial cavity  or appendage. - Right ventricle: The cavity size was mildly dilated. Systolic function was mildly to moderately reduced. - Right atrium: The atrium was mildly dilated. - Atrial septum: No defect or patent foramen ovale was identified. Echo contrast study showed no right-to-left atrial level shunt, at baseline or with provocation. - Tricuspid valve: Trivial regurgitation.  ASSESSMENT AND PLAN: 72 year old gentleman with severe aortic stenosis, severe pulmonary hypertension, moderate to severe mitral stenosis, and CAD status post CABG with severe RCA stenosis.  Treatment options were again reviewed at length with the patient and his wife. Considerations include extremely high risk cardiac surgery, percutaneous stenting of his right coronary artery and TAVR for treatment of his aortic stenosis, versus continued palliative medical therapy. The patient would like to pursue treatment of his heart disease for both symptomatic improvement and potential survival benefit. He hopes to avoid high-risk surgery.   I have reviewed his cardiac cath films  again. He has a heavily calcified right coronary artery that would be best treated with rotational atherectomy and stenting. I am very reluctant to perform rotational atherectomy in this gentleman with severe aortic stenosis and severe pulmonary hypertension with RV dysfunction. I think the best way to proceed is with balloon aortic valvuloplasty followed by rotational atherectomy and stenting of the RCA. It is most prudent to try to reduce the patient's aortic valve gradient before proceeding with other treatments. If he does well with balloon valvuloplasty, will consider PCI and potentially transseptal puncture for direct measurement of his transmitral gradient as a second procedure. Depending on his clinical response to these measures, he may ultimately be a candidate for TAVR.  I have reviewed the risks, indications, and alternatives to balloon aortic  valvuloplasty with the patient and his wife. They understand the specific risks of vascular injury, stroke, myocardial infarction, cardiac perforation, and death. They also understand that this patient's risks are increased because he has multiple cardiac problems as outlined above.   [name] 04/25/2012 11:08 PM

## 2012-04-28 NOTE — Anesthesia Preprocedure Evaluation (Addendum)
Anesthesia Evaluation  Patient identified by MRN, date of birth, ID band Patient awake    Reviewed: Allergy & Precautions, H&P , NPO status , Patient's Chart, lab work & pertinent test results  History of Anesthesia Complications Negative for: history of anesthetic complications  Airway       Dental   Pulmonary shortness of breath, with exertion and lying, sleep apnea ,  2 pillow orthopnea  Does not tolerate CPAP due to claustrophobia          Cardiovascular Exercise Tolerance: Poor hypertension, Pt. on medications + CAD, + CABG (s/p CABG x2), + Peripheral Vascular Disease and +CHF + Valvular Problems/Murmurs AS and MR  Cath: severe ASCADz with native and graft disease,  severe Aortic stenosis, severe mitral stenosis, severe  pulm HTN EF 55% bilat carotid stenosis less than 50%    Neuro/Psych  Headaches,  Neuromuscular disease    GI/Hepatic hiatal hernia, GERD-  Medicated and Controlled,  Endo/Other  diabetes (glu 190), Poorly Controlled, Type 2, Oral Hypoglycemic AgentsMorbid obesity  Renal/GU Renal InsufficiencyRenal disease (creat 1.8)     Musculoskeletal   Abdominal   Peds  Hematology   Anesthesia Other Findings   Reproductive/Obstetrics                          Anesthesia Physical Anesthesia Plan  ASA: IV  Anesthesia Plan: MAC   Post-op Pain Management:    Induction: Intravenous  Airway Management Planned:   Additional Equipment:   Intra-op Plan:   Post-operative Plan:   Informed Consent: I have reviewed the patients History and Physical, chart, labs and discussed the procedure including the risks, benefits and alternatives for the proposed anesthesia with the patient or authorized representative who has indicated his/her understanding and acceptance.   Dental advisory given  Plan Discussed with: CRNA, Anesthesiologist and Surgeon  Anesthesia Plan Comments:          Anesthesia Quick Evaluation

## 2012-04-28 NOTE — Interval H&P Note (Signed)
History and Physical Interval Note:  04/28/2012 7:48 AM  [name] [name].  has presented today for surgery, with the diagnosis of Severe Aortic Stenosis  The various methods of treatment have been discussed with the patient and family. After consideration of risks, benefits and other options for treatment, the patient has consented to  Procedure(s): BALLOON AORTIC VALVE VALVULOPLASTY (N/A) as a surgical intervention .  The patient's history has been reviewed, patient examined, no change in status, stable for surgery.  I have reviewed the patient's chart and labs.  Questions were answered to the patient's satisfaction.     [name]

## 2012-04-28 NOTE — CV Procedure (Signed)
   Cardiac Catheterization Procedure Note  Name: [name] [name]. MRN: [id number] DOB: 01/31/1941  Procedure: Left Heart Cath, temporary transvenous pacemaker placement, balloon aortic valvuloplasty, Perclose of the right femoral artery  Indication: Severe symptomatic aortic stenosis   Procedural details: The right groin was prepped, draped, and anesthetized with 1% lidocaine. Using the modified Seldinger technique, a 6 French sheath was introduced into the right femoral artery. A 7 Fr sheath was inserted into the right femoral vein for planned placement of a pacing wire. Preclose of the RFA was attempted but only the first device deplyed properly. The second 2 devices did not capture the artery appropriately. The suture was secured with hemostats and a 12 Fr sheath was advanced into the RFA. The balloon tipped pacing wire was inserted into the RV apex and pacing was tested with appropriate capture (threshold less than 1 mA). An AL-2 catheter was used to direct a straight tip wire into the LV and a Langston pigtail catheter was used to record simultaneous pressures. An Amplatz extra-stiff wire was used to exchange out for a 22 mm True balloon. Valvuloplasty was performed with rapid ventricular pacing at 180 bpm on consecutive balloon inflations after the patient's BP recovered. The pigtail was again advanced and pressure gradient was recorded. The patient tolerated the procedure well. The pigtail catheter and temp pacing wire were both removed. Protamine was administered at a dose of 50 mg. The arterial sheath was removed and the Perclose was deployed.  There were no immediate procedural complications. The patient was transferred to the post catheterization recovery area for further monitoring.  Procedural Findings: Hemodynamics:  BASELINE: AO 118/63 LV 159/29 MEAN Gradient: 47 mm Hg  POST-BAV: AO 130/69 LV 155/23 MEAN Gradient: 27 mm Hg  Final Conclusions:  Successful balloon aortic  valvuloplasty  Recommendations: Repeat echo in AM, staged PCI of the RCA probably next week, reassess Mitral Valve gradient with careful echo plus/minus direct measurement at time of PCI.  [name] 04/28/2012, 9:51 AM

## 2012-04-28 NOTE — Transfer of Care (Signed)
Immediate Anesthesia Transfer of Care Note  Patient: [name] [name].  Procedure(s) Performed: Procedure(s) with comments: BALLOON AORTIC VALVE VALVULOPLASTY (N/A) - Ultrasound guided  Patient Location: PACU  Anesthesia Type:MAC  Level of Consciousness: awake, alert , oriented and patient cooperative  Airway & Oxygen Therapy: Patient Spontanous Breathing and Patient connected to nasal cannula oxygen  Post-op Assessment: Report given to PACU RN, Post -op Vital signs reviewed and stable and Patient moving all extremities  Post vital signs: Reviewed and stable  Complications: No apparent anesthesia complications

## 2012-04-28 NOTE — Anesthesia Postprocedure Evaluation (Signed)
  Anesthesia Post-op Note  Patient: [name] [name].  Procedure(s) Performed: Procedure(s) with comments: BALLOON AORTIC VALVE VALVULOPLASTY (N/A) - Ultrasound guided  Patient Location: PACU  Anesthesia Type:MAC  Level of Consciousness: awake, alert , oriented and patient cooperative  Airway and Oxygen Therapy: Patient Spontanous Breathing and Patient connected to nasal cannula oxygen  Post-op Pain: none  Post-op Assessment: Post-op Vital signs reviewed, Patient's Cardiovascular Status Stable, Respiratory Function Stable, Patent Airway, No signs of Nausea or vomiting and Pain level controlled  Post-op Vital Signs: Reviewed and stable  Complications: No apparent anesthesia complications

## 2012-04-29 ENCOUNTER — Other Ambulatory Visit: Payer: Self-pay

## 2012-04-29 DIAGNOSIS — I359 Nonrheumatic aortic valve disorder, unspecified: Secondary | ICD-10-CM

## 2012-04-29 DIAGNOSIS — Z9889 Other specified postprocedural states: Secondary | ICD-10-CM

## 2012-04-29 DIAGNOSIS — I2510 Atherosclerotic heart disease of native coronary artery without angina pectoris: Secondary | ICD-10-CM

## 2012-04-29 LAB — BASIC METABOLIC PANEL
BUN: 22 mg/dL (ref 6–23)
Calcium: 9.4 mg/dL (ref 8.4–10.5)
GFR calc Af Amer: 63 mL/min — ABNORMAL LOW (ref >90)
GFR calc non Af Amer: 54 mL/min — ABNORMAL LOW (ref >90)
Glucose, Bld: 119 mg/dL — ABNORMAL HIGH (ref 70–99)
Potassium: 3.6 mEq/L (ref 3.5–5.1)

## 2012-04-29 LAB — GLUCOSE, CAPILLARY
Glucose-Capillary: 117 mg/dL — ABNORMAL HIGH (ref 70–99)
Glucose-Capillary: 156 mg/dL — ABNORMAL HIGH (ref 70–99)

## 2012-04-29 LAB — CBC
MCH: 27.8 pg (ref 26.0–34.0)
MCHC: 33.5 g/dL (ref 30.0–36.0)
Platelets: 222 10*3/uL (ref 150–400)
RDW: 17.6 % — ABNORMAL HIGH (ref 11.5–15.5)

## 2012-04-29 MED ORDER — METOPROLOL TARTRATE 25 MG PO TABS
25.0000 mg | ORAL_TABLET | Freq: Two times a day (BID) | ORAL | Status: DC
  Administered 2012-04-29: 25 mg via ORAL
  Filled 2012-04-29: qty 1

## 2012-04-29 MED ORDER — METOPROLOL TARTRATE 25 MG PO TABS: 25.0000 mg | ORAL_TABLET | Freq: Two times a day (BID) | ORAL | Status: DC

## 2012-04-29 NOTE — Progress Notes (Signed)
 Patient Name: [name] A Dickerson Jr. Date of Encounter: 04/29/2012    Principal Problem:   S/P balloon aortic valvuloplasty Active Problems:   Type II or unspecified type diabetes mellitus with neurological manifestations, not stated as uncontrolled(250.60)   HYPERLIPIDEMIA   AORTIC & MITRAL STENOSIS W/ INSUFFI, RHEUM/NON-RHEUM   CAD, ARTERY BYPASS GRAFT   Atherosclerotic peripheral vascular disease with ulceration   GERD   Chronic diastolic congestive heart failure   Pulmonary hypertension   Venous insufficiency (chronic) (peripheral)    SUBJECTIVE  No acute events overnight. No complaints except lower back soreness.  Denies chest pain, dizziness and shortness of breath.  CURRENT MEDS . aspirin EC  81 mg Oral Daily  . atorvastatin  20 mg Oral q1800  . clopidogrel  75 mg Oral Daily  . ezetimibe  10 mg Oral Daily  . fenofibrate  54 mg Oral Daily  . furosemide  80 mg Oral Daily  . insulin glargine  80 Units Subcutaneous BID  . metoprolol  25 mg Oral BID  . mupirocin ointment   Nasal BID  . pantoprazole  40 mg Oral QAC breakfast  . ramipril  10 mg Oral Daily  . repaglinide  2 mg Oral TID AC  . sertraline  50 mg Oral Daily    OBJECTIVE  Filed Vitals:   04/29/12 0200 04/29/12 0300 04/29/12 0400 04/29/12 0500  BP: 147/60 133/72 136/60 125/61  Pulse:    48  Temp:   98.1 F (36.7 C)   TempSrc:   Oral   Resp: 23 21 18 13  Height:      SpO2: 91% 95% 93% 92%    Intake/Output Summary (Last 24 hours) at 04/29/12 0716 Last data filed at 04/28/12 2300  Gross per 24 hour  Intake 1121.25 ml  Output    250 ml  Net 871.25 ml   PHYSICAL EXAM General: Pleasant, NAD. Neuro: Alert and oriented X 3. Moves all extremities spontaneously. Psych: Normal affect. HEENT:  Normal  Neck: Supple without bruits or JVD. Lungs:  Resp regular and unlabored, CTA. Heart: RRR, +systolic murmur at right 2nd intercostal space Abdomen: Soft, non-tender, non-distended, BS + x 4.  Extremities:  No clubbing, cyanosis. DP/PT/Radials 2+ and equal bilaterally. B/l LE pitting edema with ulcers covered with dressing   Accessory Clinical Findings CBC - pending Basic Metabolic Panel - pending  TELE - sinus bradycardia  Radiology/Studies Echo --> pending    ASSESSMENT AND PLAN Mr. Modica is a 72 yo M with complex cardiac disease including CAD s/p CABG and occlusion of his RCA graft. As well as rheumatic heart disease with severe AS, mod-severe MS and severe pulm HTN presented on 04/28/12 for elective balloon aortic valvuloplasty.  #Severe Symptomatic Aortic Stenosis: Patient is Day 1 s/p balloon aortic valvuloplasty and doing very well. Plan for PCI next week, and then re-evaluation for valvular surgery versus TAVR.  #CAD: Patient underwent cath on 02/14/12 that revealed severe native RCA -Plan for PCI to RCA next week with rotational atherectomy -ASA, Plavix, Metoprolol 50 BID (will decrease to 25mg BID in setting of bradycardia), Ramipril 10mg daily, Crestor 20mg daily  #DM: No A1c on file. Patient to restart home regimen upon discharge (Lantus 80u TID, metformin 1000 BID, Repaglinide 2mg TID)  #HLD: no lipid profile on file. Patient to restart home regimen upon discharge (co-enzyme q, ezetimibe, fenofibrate, fish oil, crestor)  #HTN: Well controlled during hospitalization. Change metoprolol to 25mg daily in setting of sinus bradycardia, but continue lasix   80mg daily, ramipril 10mg daily (assuming no change in renal function - bmet pending)  #Chronic renal failure: unclear baseline; Cr=1.8 prior to procedure, but has been as low as 1.4 last year.  #Dispo: pending labs and echo (to monitor mitral stenosis), plans to discharge later this afternoon.   Signed, SHARDA, NEEMA MD, PGY2  Patient seen, examined. Available data reviewed. Agree with findings, assessment, and plan as outlined by Dr Sharda. The patient is doing well following BAV yesterday. His echo images were personally  reviewed and he has good reduction in his aortic stenosis. Mitral stenosis appears moderate. Plans as outlined for staged PCI of the RCA next week. We will arrange this and contact him with the schedule. This procedure will require rotational atherectomy and stenting.  Rilee Knoll, M.D. 04/29/2012 9:03 PM   

## 2012-04-29 NOTE — Progress Notes (Signed)
Discharge instructions reviewed with pt and wife. Medications, dosages, and times discussed. F/u appointments reviewed. All questions answered. PIV d/c'd. All personal belongings taken with pt. Pt transported out of hospital via wheelchair at this time.

## 2012-04-29 NOTE — Progress Notes (Signed)
  Echocardiogram 2D Echocardiogram has been performed.  Georgian Co 04/29/2012, 9:12 AM

## 2012-04-29 NOTE — Discharge Summary (Signed)
Patient ID: [name] [name].,  MRN: [id number], DOB/AGE: September 08, 1940 72 y.o.  Admit date: 04/28/2012 Discharge date: 04/29/2012  Primary Care Provider: Crawford Givens Primary Cardiologist: Dr. Excell Seltzer  Discharge Diagnoses Principal Problem:   S/P balloon aortic valvuloplasty Active Problems:   Type II or unspecified type diabetes mellitus with neurological manifestations, not stated as uncontrolled(250.60)   HYPERLIPIDEMIA   AORTIC & MITRAL STENOSIS W/ INSUFFI, RHEUM/NON-RHEUM   CAD, ARTERY BYPASS GRAFT   Atherosclerotic peripheral vascular disease with ulceration   GERD   Chronic diastolic congestive heart failure   Pulmonary hypertension   Venous insufficiency (chronic) (peripheral)   Allergies Allergies  Allergen Reactions  . Adhesive (Tape) Rash  . Celecoxib Rash    Procedures 04/28/12 Procedure: Left Heart Cath, temporary transvenous pacemaker placement, balloon aortic valvuloplasty, Perclose of the right femoral artery Indication: Severe symptomatic aortic stenosis  Procedural Findings:  Hemodynamics:  BASELINE:  AO 118/63  LV 159/29  MEAN Gradient: 47 mm Hg  POST-BAV:  AO 130/69  LV 155/23  MEAN Gradient: 27 mm Hg  Final Conclusions: Successful balloon aortic valvuloplasty  Recommendations: Repeat echo in AM, staged PCI of the RCA probably next week, reassess Mitral Valve gradient with careful echo plus/minus direct measurement at time of PCI.   History of Present Illness 04/25/12:  [name] returns for followup. He is a 72 year old gentleman with complex cardiac disease. His problems include coronary artery disease with prior CABG. His right coronary artery graft is occluded in the native RCA has a heavily calcified 95% lesion. The patient also has rheumatic heart disease with severe aortic stenosis, moderate to severe mitral stenosis, and severe pulmonary hypertension. He has recently undergone cardiac catheterization and transesophageal echo. He's been evaluated  by Dr Cornelius Moras with cardiac surgery. We have discussed this case extensively. He returns today for further discussion of treatment options.  The patient continues to have shortness of breath with exertion. At times he has resting dyspnea. He states that he has "good days and bad days." He denies chest pain or pressure. He has chronic leg swelling with pain in his legs when he ambulates. He denies syncope.    Hospital Course [name] is a 72 yo M with complex cardiac disease including CAD s/p CABG and occlusion of his RCA graft. As well as rheumatic heart disease with severe AS, mod-severe MS and severe pulm HTN presented on 04/28/12 for elective balloon aortic valvuloplasty.   #Severe Symptomatic Aortic Stenosis: Patient was discharged on day 1 s/p balloon aortic valvuloplasty and was doing very well. Plan for PCI next week, and then re-evaluation for valvular surgery versus TAVR.   #CAD: Patient underwent cath on 02/14/12 that revealed severe native RCA.  Plan for PCI to RCA next week with rotational atherectomy.  He will continue with ASA, Plavix, Metoprolol 25 BID (decreased from 50mg  BID in setting of bradycardia), Ramipril 10mg  daily, Crestor 20mg  daily.   #DM: No A1c on file. Patient to restart home regimen upon discharge (Lantus 80u TID, metformin 1000 BID, Repaglinide 2mg  TID)   #HLD: no lipid profile on file. Patient to restart home regimen upon discharge (co-enzyme q, ezetimibe, fenofibrate, fish oil, crestor)   #HTN: Well controlled during hospitalization. Change metoprolol to 25mg  daily in setting of sinus bradycardia, but continue lasix 80mg  daily, ramipril 10mg  daily   #Chronic renal failure: Cr = 1.3 at discharge, improved from 1.8 six days prior.   Discharge Vitals Blood pressure 143/59, pulse 43, temperature 97.6 F (36.4 C), temperature source  Oral, resp. rate 21, height 5\' 10"  (1.778 m), SpO2 96.00%.  There were no vitals filed for this visit.  Labs  CBC  Recent Labs   04/29/12 0725  WBC 6.6  HGB 11.5*  HCT 34.3*  MCV 82.9  PLT 222   Basic Metabolic Panel  Recent Labs  04/29/12 0725  NA 137  K 3.6  CL 100  CO2 28  GLUCOSE 119*  BUN 22  CREATININE 1.30  CALCIUM 9.4    Disposition  Pt is being discharged home today in good condition.  Dr. Earmon [place] office will contact patient regarding upcoming PCI to RCA.  Follow-up Plans & Appointments  Follow-up Information   Follow up with [name], MD.   Contact information:   [address] [place] [address] [phone]       Discharge Medications    Medication List    TAKE these medications       aspirin 81 MG tablet  Take 81 mg by mouth daily.     clopidogrel 75 MG tablet  Commonly known as:  PLAVIX  Take 1 tablet (75 mg total) by mouth daily.     co-enzyme Q-10 30 MG capsule  Take 30 mg by mouth daily.     ezetimibe 10 MG tablet  Commonly known as:  ZETIA  Take 1 tablet (10 mg total) by mouth daily.     fenofibrate micronized 130 MG capsule  Commonly known as:  ANTARA  Take 1 capsule (130 mg total) by mouth daily before breakfast.     fish oil-omega-3 fatty acids 1000 MG capsule  Take 2 g by mouth 2 (two) times daily.     furosemide 80 MG tablet  Commonly known as:  LASIX  Take 1 tablet (80 mg total) by mouth daily.     LANTUS 100 UNIT/ML injection  Generic drug:  insulin glargine  Inject 80 Units into the skin 3 (three) times daily.     metFORMIN 1000 MG tablet  Commonly known as:  GLUCOPHAGE  Take 1,000 mg by mouth 2 (two) times daily with a meal.     metoprolol tartrate 25 MG tablet  Commonly known as:  LOPRESSOR  Take 1 tablet (25 mg total) by mouth 2 (two) times daily.     nitroGLYCERIN 0.4 MG SL tablet  Commonly known as:  NITROSTAT  Place 1 tablet (0.4 mg total) under the tongue every 5 (five) minutes as needed for chest pain. May repeat up to 3 times as needed     pantoprazole 40 MG tablet  Commonly known as:  PROTONIX    Take 1 tablet (40 mg total) by mouth daily.     ramipril 10 MG tablet  Commonly known as:  ALTACE  Take 1 tablet (10 mg total) by mouth daily.     repaglinide 2 MG tablet  Commonly known as:  PRANDIN  Take 2 mg by mouth 3 (three) times daily before meals.     rosuvastatin 20 MG tablet  Commonly known as:  CRESTOR  Take 1 tablet (20 mg total) by mouth at bedtime.     sertraline 50 MG tablet  Commonly known as:  ZOLOFT  Take 1 tablet (50 mg total) by mouth daily.     Vitamin D (Ergocalciferol) 50000 UNITS Caps  Commonly known as:  DRISDOL  Take 50,000 Units by mouth every 7 (seven) days.        Outstanding Labs/Studies Echo was done on 04/29/12 and had  not been formally read by discharge; Dr. Excell Seltzer reviewed echo for discharge.  Duration of Discharge Encounter   Greater than 30 minutes including physician time.  Dola Factor MD, PGY2 04/29/2012, 12:04 PM

## 2012-05-02 ENCOUNTER — Encounter: Payer: Self-pay | Admitting: Cardiovascular Disease

## 2012-05-02 NOTE — Telephone Encounter (Signed)
New problem   Pt's wife stated they was suppose to have gotten a call to sched another procedure for her husband to open up an artery on the right side of the heart and put a stent in. Go go left side to check pressure in the mitral valvue. Pt want to know when the surgery will be preformed. Pt's wife would like to speak to a nurse.

## 2012-05-02 NOTE — Telephone Encounter (Signed)
This encounter was created in error - please disregard.

## 2012-05-07 ENCOUNTER — Other Ambulatory Visit: Payer: Self-pay | Admitting: Cardiovascular Disease

## 2012-05-07 ENCOUNTER — Encounter (HOSPITAL_COMMUNITY)
Admission: RE | Disposition: A | Discharge status: Home / Self Care | Payer: Self-pay | Source: Ambulatory Visit | Attending: Cardiovascular Disease

## 2012-05-07 ENCOUNTER — Ambulatory Visit (HOSPITAL_COMMUNITY)
Admission: RE | Admit: 2012-05-07 | Discharge: 2012-05-09 | Disposition: A | Discharge status: Home / Self Care | Payer: Medicare Other | Source: Ambulatory Visit | Attending: Cardiovascular Disease | Admitting: Cardiovascular Disease

## 2012-05-07 DIAGNOSIS — E1142 Type 2 diabetes mellitus with diabetic polyneuropathy: Secondary | ICD-10-CM | POA: Insufficient documentation

## 2012-05-07 DIAGNOSIS — I5032 Chronic diastolic (congestive) heart failure: Secondary | ICD-10-CM | POA: Insufficient documentation

## 2012-05-07 DIAGNOSIS — E785 Hyperlipidemia, unspecified: Secondary | ICD-10-CM | POA: Insufficient documentation

## 2012-05-07 DIAGNOSIS — I2510 Atherosclerotic heart disease of native coronary artery without angina pectoris: Secondary | ICD-10-CM

## 2012-05-07 DIAGNOSIS — I2789 Other specified pulmonary heart diseases: Secondary | ICD-10-CM | POA: Insufficient documentation

## 2012-05-07 DIAGNOSIS — E1149 Type 2 diabetes mellitus with other diabetic neurological complication: Secondary | ICD-10-CM | POA: Insufficient documentation

## 2012-05-07 DIAGNOSIS — I359 Nonrheumatic aortic valve disorder, unspecified: Secondary | ICD-10-CM

## 2012-05-07 DIAGNOSIS — I509 Heart failure, unspecified: Secondary | ICD-10-CM | POA: Insufficient documentation

## 2012-05-07 DIAGNOSIS — I050 Rheumatic mitral stenosis: Secondary | ICD-10-CM

## 2012-05-07 DIAGNOSIS — G4730 Sleep apnea, unspecified: Secondary | ICD-10-CM | POA: Insufficient documentation

## 2012-05-07 DIAGNOSIS — I739 Peripheral vascular disease, unspecified: Secondary | ICD-10-CM | POA: Insufficient documentation

## 2012-05-07 DIAGNOSIS — K219 Gastro-esophageal reflux disease without esophagitis: Secondary | ICD-10-CM | POA: Insufficient documentation

## 2012-05-07 DIAGNOSIS — I10 Essential (primary) hypertension: Secondary | ICD-10-CM | POA: Diagnosis present

## 2012-05-07 DIAGNOSIS — I872 Venous insufficiency (chronic) (peripheral): Secondary | ICD-10-CM | POA: Insufficient documentation

## 2012-05-07 DIAGNOSIS — I350 Nonrheumatic aortic (valve) stenosis: Secondary | ICD-10-CM

## 2012-05-07 DIAGNOSIS — Z955 Presence of coronary angioplasty implant and graft: Secondary | ICD-10-CM

## 2012-05-07 DIAGNOSIS — I080 Rheumatic disorders of both mitral and aortic valves: Secondary | ICD-10-CM | POA: Insufficient documentation

## 2012-05-07 DIAGNOSIS — L98499 Non-pressure chronic ulcer of skin of other sites with unspecified severity: Secondary | ICD-10-CM | POA: Insufficient documentation

## 2012-05-07 DIAGNOSIS — I272 Pulmonary hypertension, unspecified: Secondary | ICD-10-CM | POA: Diagnosis present

## 2012-05-07 HISTORY — DX: Atherosclerotic heart disease of native coronary artery without angina pectoris: I25.10

## 2012-05-07 HISTORY — PX: PERCUTANEOUS CORONARY ROTOBLATOR INTERVENTION (PCI-R): SHX5484

## 2012-05-07 LAB — POCT I-STAT 3, VENOUS BLOOD GAS (G3P V)
Acid-Base Excess: 1 mmol/L (ref 0.0–2.0)
pCO2, Ven: 45.2 mmHg (ref 45.0–50.0)
pH, Ven: 7.379 — ABNORMAL HIGH (ref 7.250–7.300)
pO2, Ven: 33 mmHg (ref 30.0–45.0)

## 2012-05-07 LAB — POCT I-STAT 3, ART BLOOD GAS (G3+)
TCO2: 31 mmol/L (ref 0–100)
pH, Arterial: 7.413 (ref 7.350–7.450)

## 2012-05-07 LAB — GLUCOSE, CAPILLARY
Glucose-Capillary: 76 mg/dL (ref 70–99)
Glucose-Capillary: 137 mg/dL — ABNORMAL HIGH (ref 70–99)
Glucose-Capillary: 277 mg/dL — ABNORMAL HIGH (ref 70–99)

## 2012-05-07 LAB — POCT ACTIVATED CLOTTING TIME: Activated Clotting Time: 372 seconds

## 2012-05-07 SURGERY — PERCUTANEOUS CORONARY ROTOBLATOR INTERVENTION (PCI-R)
Anesthesia: LOCAL

## 2012-05-07 MED ORDER — ONDANSETRON HCL 4 MG/2ML IJ SOLN
4.0000 mg | Freq: Four times a day (QID) | INTRAMUSCULAR | Status: DC | PRN
  Filled 2012-05-07: qty 2

## 2012-05-07 MED ORDER — SODIUM CHLORIDE 0.9 % IV SOLN: 250.0000 mL | INTRAVENOUS | Status: DC | PRN

## 2012-05-07 MED ORDER — ACETAMINOPHEN 325 MG PO TABS
650.0000 mg | ORAL_TABLET | ORAL | Status: DC | PRN
  Administered 2012-05-08: 14:00:00 650 mg via ORAL
  Filled 2012-05-07: qty 2
  Filled 2012-05-07: qty 1

## 2012-05-07 MED ORDER — INSULIN GLARGINE 100 UNIT/ML ~~LOC~~ SOLN
80.0000 [IU] | Freq: Three times a day (TID) | SUBCUTANEOUS | Status: DC
  Administered 2012-05-07 – 2012-05-09 (×5): 80 [IU] via SUBCUTANEOUS

## 2012-05-07 MED ORDER — RAMIPRIL 10 MG PO TABS: 10.0000 mg | ORAL_TABLET | Freq: Every day | ORAL | Status: DC

## 2012-05-07 MED ORDER — ASPIRIN 81 MG PO TABS: 81.0000 mg | ORAL_TABLET | Freq: Every day | ORAL | Status: DC

## 2012-05-07 MED ORDER — HEPARIN (PORCINE) IN NACL 2-0.9 UNIT/ML-% IJ SOLN
INTRAMUSCULAR | Status: AC
  Filled 2012-05-07: qty 1000

## 2012-05-07 MED ORDER — CLOPIDOGREL BISULFATE 75 MG PO TABS
75.0000 mg | ORAL_TABLET | Freq: Every day | ORAL | Status: DC
  Administered 2012-05-08 – 2012-05-09 (×2): 75 mg via ORAL
  Filled 2012-05-07 (×2): qty 1

## 2012-05-07 MED ORDER — PANTOPRAZOLE SODIUM 40 MG PO TBEC
40.0000 mg | DELAYED_RELEASE_TABLET | Freq: Every day | ORAL | Status: DC
  Administered 2012-05-08 – 2012-05-09 (×2): 40 mg via ORAL
  Filled 2012-05-07 (×2): qty 1

## 2012-05-07 MED ORDER — MIDAZOLAM HCL 2 MG/2ML IJ SOLN
INTRAMUSCULAR | Status: AC
  Filled 2012-05-07: qty 2

## 2012-05-07 MED ORDER — RAMIPRIL 10 MG PO CAPS
10.0000 mg | ORAL_CAPSULE | Freq: Every day | ORAL | Status: DC
  Administered 2012-05-08 – 2012-05-09 (×2): 10 mg via ORAL
  Filled 2012-05-07 (×2): qty 1

## 2012-05-07 MED ORDER — METOPROLOL TARTRATE 25 MG PO TABS
25.0000 mg | ORAL_TABLET | Freq: Two times a day (BID) | ORAL | Status: DC
  Administered 2012-05-07 – 2012-05-09 (×4): 25 mg via ORAL
  Filled 2012-05-07 (×5): qty 1

## 2012-05-07 MED ORDER — SODIUM CHLORIDE 0.9 % IJ SOLN: 3.0000 mL | Freq: Two times a day (BID) | INTRAMUSCULAR | Status: DC

## 2012-05-07 MED ORDER — EZETIMIBE 10 MG PO TABS
10.0000 mg | ORAL_TABLET | Freq: Every day | ORAL | Status: DC
  Administered 2012-05-08 – 2012-05-09 (×2): 10 mg via ORAL
  Filled 2012-05-07 (×2): qty 1

## 2012-05-07 MED ORDER — ASPIRIN EC 81 MG PO TBEC
81.0000 mg | DELAYED_RELEASE_TABLET | Freq: Every day | ORAL | Status: DC
  Filled 2012-05-07 (×2): qty 1

## 2012-05-07 MED ORDER — FENTANYL CITRATE 0.05 MG/ML IJ SOLN
INTRAMUSCULAR | Status: AC
  Filled 2012-05-07: qty 2

## 2012-05-07 MED ORDER — FUROSEMIDE 80 MG PO TABS
80.0000 mg | ORAL_TABLET | Freq: Every day | ORAL | Status: DC
  Filled 2012-05-07: qty 1

## 2012-05-07 MED ORDER — LIDOCAINE HCL (PF) 1 % IJ SOLN
INTRAMUSCULAR | Status: AC
  Filled 2012-05-07: qty 30

## 2012-05-07 MED ORDER — DIAZEPAM 5 MG PO TABS
5.0000 mg | ORAL_TABLET | ORAL | Status: AC
  Administered 2012-05-07: 5 mg via ORAL
  Filled 2012-05-07: qty 1

## 2012-05-07 MED ORDER — SODIUM CHLORIDE 0.9 % IJ SOLN: 3.0000 mL | INTRAMUSCULAR | Status: DC | PRN

## 2012-05-07 MED ORDER — ASPIRIN 81 MG PO CHEW
324.0000 mg | CHEWABLE_TABLET | ORAL | Status: AC
  Administered 2012-05-07: 324 mg via ORAL
  Filled 2012-05-07: qty 4

## 2012-05-07 MED ORDER — OXYCODONE-ACETAMINOPHEN 5-325 MG PO TABS
1.0000 | ORAL_TABLET | ORAL | Status: DC | PRN
  Administered 2012-05-07 – 2012-05-08 (×2): 2 via ORAL
  Filled 2012-05-07 (×2): qty 2

## 2012-05-07 MED ORDER — REPAGLINIDE 2 MG PO TABS
2.0000 mg | ORAL_TABLET | Freq: Three times a day (TID) | ORAL | Status: DC
  Administered 2012-05-08 – 2012-05-09 (×4): 2 mg via ORAL
  Filled 2012-05-07 (×10): qty 1

## 2012-05-07 MED ORDER — SODIUM CHLORIDE 0.9 % IV SOLN
0.2500 mg/kg/h | INTRAVENOUS | Status: DC
  Filled 2012-05-07: qty 250

## 2012-05-07 MED ORDER — SODIUM CHLORIDE 0.9 % IV SOLN: INTRAVENOUS | Status: AC

## 2012-05-07 MED ORDER — SERTRALINE HCL 50 MG PO TABS
50.0000 mg | ORAL_TABLET | Freq: Every day | ORAL | Status: DC
  Administered 2012-05-08 – 2012-05-09 (×2): 50 mg via ORAL
  Filled 2012-05-07 (×2): qty 1

## 2012-05-07 MED ORDER — ATORVASTATIN CALCIUM 40 MG PO TABS
40.0000 mg | ORAL_TABLET | Freq: Every day | ORAL | Status: DC
  Administered 2012-05-08: 40 mg via ORAL
  Filled 2012-05-07 (×2): qty 1

## 2012-05-07 MED ORDER — NITROGLYCERIN 0.4 MG SL SUBL: 0.4000 mg | SUBLINGUAL_TABLET | SUBLINGUAL | Status: DC | PRN

## 2012-05-07 MED ORDER — SODIUM CHLORIDE 0.9 % IV SOLN
INTRAVENOUS | Status: DC
  Administered 2012-05-07: 09:00:00 via INTRAVENOUS

## 2012-05-07 MED ORDER — FENOFIBRATE 54 MG PO TABS
54.0000 mg | ORAL_TABLET | Freq: Every day | ORAL | Status: DC
  Filled 2012-05-07 (×2): qty 1

## 2012-05-07 MED ORDER — BIVALIRUDIN 250 MG IV SOLR
INTRAVENOUS | Status: AC
  Filled 2012-05-07: qty 250

## 2012-05-07 MED ORDER — VERAPAMIL HCL 2.5 MG/ML IV SOLN
INTRAVENOUS | Status: AC
  Filled 2012-05-07: qty 4

## 2012-05-07 MED ORDER — MORPHINE SULFATE 4 MG/ML IJ SOLN
4.0000 mg | INTRAMUSCULAR | Status: DC | PRN
  Administered 2012-05-07: 4 mg via INTRAVENOUS
  Filled 2012-05-07: qty 1

## 2012-05-07 MED ORDER — HEPARIN SODIUM (PORCINE) 1000 UNIT/ML IJ SOLN
INTRAMUSCULAR | Status: AC
  Filled 2012-05-07: qty 1

## 2012-05-07 NOTE — CV Procedure (Addendum)
Cardiac Catheterization Procedure Note  Name: [name] [name]. MRN: [id number] DOB: 17-Sep-1940  Procedure: Right Heart Cath, Left Heart Cath, temporary transvenous pacemaker placement; rotational atherectomy, PTCA, and stenting of the right coronary artery; and a Perclose of right femoral artery   Indication: Aortic stenosis, mitral stenosis, pulmonary hypertension, congestive heart failure, and severe coronary artery disease. Mr. Lawes is a very complex 72 year old gentleman with extensive CAD, rheumatic heart disease, and prior bypass surgery. Please see previous notes for full details. He underwent balloon aortic valvuloplasty last week. He presents today for rotational atherectomy of the RCA. He is being considered for TAVR versus open redo surgical valve replacement. Plan today for right and left heart catheterization to assess mitral stenosis as well as PCI to right coronary artery. The RCA is severely calcified and diffusely diseased. There are 2 areas of very severe stenosis in the mid and distal vessel and I have plan on treating him with bare-metal stents so that the patient can discontinue Plavix after 30 days and move forward with further surgical intervention.    Procedural Details: The right groin was prepped, draped, and anesthetized with 1% lidocaine. Using a front wall puncture, the right femoral artery was accessed with a 0.035 inch wire. The vessel was pre-closed and then an 8 French sheath was placed. The right femoral vein was accessed and a 7 French sheath was placed. A Swan-Ganz catheter was used for right heart catheterization. An AL-1 catheter and straight-tip wire were used to cross the aortic valve and this was changed out to a pigtail catheter over an exchange length J-wire. Simultaneous pressures were recorded and the pulmonary capillary wedge and left ventricular tracings. A pullback across the aortic valve was performed..  A Swan-Ganz catheter was removed and a temporary  transvenous pacing wire was advanced into the RV apex. The pacing threshold was less than 0.8 milliamps. An 8 [place] JR 4 guide catheter was inserted. Angiomax was used for anticoagulation. Once a therapeutic ACT was achieved a Rotafloppy wire was advanced into the distal right coronary artery. There were 2 severe lesions, one in the mid RCA and another in the distal RCA. The entire vessel was very severely calcified. The mid vessel has a focal 90% stenosis in the junction of the mid/distal vessel has another focal 90% stenosis. After platform he outside the body at 165,000 rpm's, the 1.5 mm Rotablator burr was advanced and 2 runs were performed through the right coronary artery. This was upsized to a 2 mm bur and again repeated runs were done. This didn't good bit of debulking. At that point I advanced a 3.0 x 15 mm balloon over the Rotablator wire and this was exchanged out for a long prolonged wire. Balloon inflations were performed over each lesion to 12 atmospheres. The distal lesion was then stented with a 3.5 x 15 mm MultiLink vision stent. The mid lesion was stented with a 3.5 x 15 mm MultiLink vision bare metal stent. The distal stent was deployed at 14 atmospheres, the mid stent was deployed at 16 atmospheres. Both stents were postdilated with a 3.75 x 12 mm noncompliant balloon to 16 atmospheres. There was a good angiographic result with no residual stenosis at either stent site. The patient tolerated the procedure well. There were no immediate complications. At the completion of the procedure the Perclose device was used for femoral hemostasis. There was residual oozing from the groin site and a FemoStop will probably have to be placed.   Procedural  Findings: Hemodynamics PA 97/32 with a mean of 54 PCWP V wave 44, A wave 37, mean 33 LV 168/18 AO 143/65 with a mean of 94  Oxygen saturations: PA 62 AO 97  Cardiac Output (Fick) 5.6  Cardiac Index (Fick) 2.4   Mitral valve hemodynamics: Mean  gradient 11, mitral valve area 1.5  Aortic valve hemodynamics: Mean gradient 20, valve area 1.7  Lesion 1: RCA/mid Baseline 90% stenosis with heavy calcification Baseline TIMI flow 3 Stent: 3.5 x 15 mm bare-metal Post stent 0% stenosis Post stent TIMI flow 3  Lesion 2: RCA/distal Baseline 90% stenosis with heavy calcification Baseline TIMI flow 3 Stent: 3.5 x 15 mm bare-metal Post stent 0% stenosis Post stent TIMI flow 3  Final Conclusions:   1. Moderate residual aortic stenosis status post balloon aortic valvuloplasty 2. Moderate mitral stenosis 3. Severe pulmonary hypertension 4. Successful rotational atherectomy and stenting of the right coronary artery using bare-metal stents.  Recommendations: Will review the patient's clinical situation and hemodynamics began with Dr. Cornelius Moras. We will see him back in the multidisciplinary valve clinic. He will remain on aspirin and Plavix for a minimum of 30 days following PCI. His hemodynamics suggest moderate residual aortic stenosis. I think his aortic valve area is over calculated at 1.7 and I'm sure it is much tighter than this. The mitral valve appears to have moderate stenosis based on simultaneous pressure recordings and pulmonary capillary wedge and LV.  [name] 05/07/2012, 1:29 PM

## 2012-05-07 NOTE — H&P (View-Only) (Signed)
Patient Name: [name] [name]. Date of Encounter: 04/29/2012    Principal Problem:   S/P balloon aortic valvuloplasty Active Problems:   Type II or unspecified type diabetes mellitus with neurological manifestations, not stated as uncontrolled(250.60)   HYPERLIPIDEMIA   AORTIC & MITRAL STENOSIS W/ INSUFFI, RHEUM/NON-RHEUM   CAD, ARTERY BYPASS GRAFT   Atherosclerotic peripheral vascular disease with ulceration   GERD   Chronic diastolic congestive heart failure   Pulmonary hypertension   Venous insufficiency (chronic) (peripheral)    SUBJECTIVE  No acute events overnight. No complaints except lower back soreness.  Denies chest pain, dizziness and shortness of breath.  CURRENT MEDS . aspirin EC  81 mg Oral Daily  . atorvastatin  20 mg Oral q1800  . clopidogrel  75 mg Oral Daily  . ezetimibe  10 mg Oral Daily  . fenofibrate  54 mg Oral Daily  . furosemide  80 mg Oral Daily  . insulin glargine  80 Units Subcutaneous BID  . metoprolol  25 mg Oral BID  . mupirocin ointment   Nasal BID  . pantoprazole  40 mg Oral QAC breakfast  . ramipril  10 mg Oral Daily  . repaglinide  2 mg Oral TID AC  . sertraline  50 mg Oral Daily    OBJECTIVE  Filed Vitals:   04/29/12 0200 04/29/12 0300 04/29/12 0400 04/29/12 0500  BP: 147/60 133/72 136/60 125/61  Pulse:    48  Temp:   98.1 F (36.7 C)   TempSrc:   Oral   Resp: 23 21 18 13   Height:      SpO2: 91% 95% 93% 92%    Intake/Output Summary (Last 24 hours) at 04/29/12 0716 Last data filed at 04/28/12 2300  Gross per 24 hour  Intake 1121.25 ml  Output    250 ml  Net 871.25 ml   PHYSICAL EXAM General: Pleasant, NAD. Neuro: Alert and oriented X 3. Moves all extremities spontaneously. Psych: Normal affect. HEENT:  Normal  Neck: Supple without bruits or JVD. Lungs:  Resp regular and unlabored, CTA. Heart: RRR, +systolic murmur at right 2nd intercostal space Abdomen: Soft, non-tender, non-distended, BS + x 4.  Extremities:  No clubbing, cyanosis. DP/PT/Radials 2+ and equal bilaterally. B/l LE pitting edema with ulcers covered with dressing   Accessory Clinical Findings CBC - pending Basic Metabolic Panel - pending  TELE - sinus bradycardia  Radiology/Studies Echo --> pending    ASSESSMENT AND PLAN [name] [name] is a 72 yo M with complex cardiac disease including CAD s/p CABG and occlusion of his RCA graft. As well as rheumatic heart disease with severe AS, mod-severe MS and severe pulm HTN presented on 04/28/12 for elective balloon aortic valvuloplasty.  #Severe Symptomatic Aortic Stenosis: Patient is Day 1 s/p balloon aortic valvuloplasty and doing very well. Plan for PCI next week, and then re-evaluation for valvular surgery versus TAVR.  #CAD: Patient underwent cath on 02/14/12 that revealed severe native RCA -Plan for PCI to RCA next week with rotational atherectomy -ASA, Plavix, Metoprolol 50 BID (will decrease to 25mg  BID in setting of bradycardia), Ramipril 10mg  daily, Crestor 20mg  daily  #DM: No A1c on file. Patient to restart home regimen upon discharge (Lantus 80u TID, metformin 1000 BID, Repaglinide 2mg  TID)  #HLD: no lipid profile on file. Patient to restart home regimen upon discharge (co-enzyme q, ezetimibe, fenofibrate, fish oil, crestor)  #HTN: Well controlled during hospitalization. Change metoprolol to 25mg  daily in setting of sinus bradycardia, but continue lasix  80mg  daily, ramipril 10mg  daily (assuming no change in renal function - bmet pending)  #Chronic renal failure: unclear baseline; Cr=1.8 prior to procedure, but has been as low as 1.4 last year.  #Dispo: pending labs and echo (to monitor mitral stenosis), plans to discharge later this afternoon.   Signed, Stacy Gardner MD, PGY2  Patient seen, examined. Available data reviewed. Agree with findings, assessment, and plan as outlined by Dr Everardo Beals. The patient is doing well following BAV yesterday. His echo images were personally  reviewed and he has good reduction in his aortic stenosis. Mitral stenosis appears moderate. Plans as outlined for staged PCI of the RCA next week. We will arrange this and contact him with the schedule. This procedure will require rotational atherectomy and stenting.  [name], M.D. 04/29/2012 9:03 PM

## 2012-05-07 NOTE — Interval H&P Note (Signed)
History and Physical Interval Note:  05/07/2012 11:56 AM  [name] [name].  has presented today for surgery, with the diagnosis of CAD  The various methods of treatment have been discussed with the patient and family. After consideration of risks, benefits and other options for treatment, the patient has consented to  Procedure(s): PERCUTANEOUS CORONARY ROTOBLATOR INTERVENTION (PCI-R) (N/A) as a surgical intervention .  The patient's history has been reviewed, patient examined, no change in status, stable for surgery.  I have reviewed the patient's chart and labs.  Questions were answered to the patient's satisfaction.    Pt is doing very well after balloon aortic valvuloplasty. He feels much better. Presents today for R/L heart cath to assess mitral gradient and rotational atherectomy of the RCA.   [name]

## 2012-05-07 NOTE — Progress Notes (Signed)
Pt R femoral venous sheath pulled at 15:20.  Pressure held for 15 mins.  Bedrest started at 15:35.  Pt tolerated procedure well.  NSR, BP 150s/70s, 99% 2 L/M. R pedal pulse unchanged from pre-procedure.

## 2012-05-08 ENCOUNTER — Encounter (HOSPITAL_COMMUNITY): Payer: Self-pay | Admitting: *Deleted

## 2012-05-08 DIAGNOSIS — I509 Heart failure, unspecified: Secondary | ICD-10-CM

## 2012-05-08 DIAGNOSIS — I5032 Chronic diastolic (congestive) heart failure: Secondary | ICD-10-CM

## 2012-05-08 LAB — BASIC METABOLIC PANEL
Calcium: 9.1 mg/dL (ref 8.4–10.5)
GFR calc Af Amer: 68 mL/min — ABNORMAL LOW (ref >90)
GFR calc non Af Amer: 59 mL/min — ABNORMAL LOW (ref >90)
Sodium: 140 mEq/L (ref 135–145)

## 2012-05-08 LAB — GLUCOSE, CAPILLARY
Glucose-Capillary: 278 mg/dL — ABNORMAL HIGH (ref 70–99)
Glucose-Capillary: 214 mg/dL — ABNORMAL HIGH (ref 70–99)
Glucose-Capillary: 201 mg/dL — ABNORMAL HIGH (ref 70–99)

## 2012-05-08 LAB — CBC
MCH: 27 pg (ref 26.0–34.0)
MCHC: 32.8 g/dL (ref 30.0–36.0)
Platelets: 224 10*3/uL (ref 150–400)
RDW: 17.2 % — ABNORMAL HIGH (ref 11.5–15.5)

## 2012-05-08 MED ORDER — FUROSEMIDE 10 MG/ML IJ SOLN
80.0000 mg | Freq: Two times a day (BID) | INTRAMUSCULAR | Status: DC
  Administered 2012-05-08 (×2): 80 mg via INTRAVENOUS
  Filled 2012-05-08 (×5): qty 8

## 2012-05-08 MED ORDER — INSULIN ASPART 100 UNIT/ML ~~LOC~~ SOLN
0.0000 [IU] | Freq: Three times a day (TID) | SUBCUTANEOUS | Status: DC
  Administered 2012-05-08: 13:00:00 8 [IU] via SUBCUTANEOUS
  Administered 2012-05-08 – 2012-05-09 (×2): 5 [IU] via SUBCUTANEOUS

## 2012-05-08 MED FILL — Dextrose Inj 5%: INTRAVENOUS | Qty: 50 | Status: AC

## 2012-05-08 NOTE — Progress Notes (Signed)
CARDIAC REHAB PHASE I   PRE:  Rate/Rhythm: 57 SB    BP: sitting 154/66    SaO2: 96 RA  MODE:  Ambulation: 300 ft   POST:  Rate/Rhythm: 76 SR    BP: sitting 149/63     SaO2: 90-92 RA  Pt tolerated fairly well. Denied SOB which is new for him. Limiting factor was his hips bothering him. Ed completed with wife present. Discussed CRPII. Could do if no plans for valve surg in near future. Pt agreed to referral being sent. Also discussed daily wts, gave HF booklet. [id number]  Harriet Masson CES, ACSM

## 2012-05-08 NOTE — Progress Notes (Signed)
Patient Name: [name] [name]. Date of Encounter: 05/08/2012   Principal Problem:   CAD (coronary artery disease) Active Problems:   Aortic stenosis   Chronic diastolic congestive heart failure   Type II or unspecified type diabetes mellitus with neurological manifestations, not stated as uncontrolled(250.60)   SLEEP APNEA   Mitral stenosis   Pulmonary hypertension   HYPERLIPIDEMIA   HYPERTENSION   Venous insufficiency (chronic) (peripheral)   SUBJECTIVE  No chest pain.  Wore O2 last night in bed but wasn't particularly dyspneic.  CURRENT MEDS . aspirin EC  81 mg Oral Daily  . atorvastatin  40 mg Oral q1800  . clopidogrel  75 mg Oral Q breakfast  . ezetimibe  10 mg Oral Daily  . fenofibrate  54 mg Oral Daily  . furosemide  80 mg Oral Daily  . insulin glargine  80 Units Subcutaneous TID  . metoprolol  25 mg Oral BID  . pantoprazole  40 mg Oral Daily  . ramipril  10 mg Oral Daily  . repaglinide  2 mg Oral TID AC  . sertraline  50 mg Oral Daily   OBJECTIVE  Filed Vitals:   05/08/12 0016 05/08/12 0459 05/08/12 0500 05/08/12 0748  BP: 140/62 149/69 149/69 154/66  Pulse: 58 57  56  Temp: 97.7 F (36.5 C) 97.6 F (36.4 C)  97.8 F (36.6 C)  TempSrc: Oral Oral  Oral  Resp:      Height:      Weight: 250 lb 3.6 oz (113.5 kg)     SpO2: 94% 97%  96%    Intake/Output Summary (Last 24 hours) at 05/08/12 0859 Last data filed at 05/07/12 2000  Gross per 24 hour  Intake 673.75 ml  Output      0 ml  Net 673.75 ml   Filed Weights   05/07/12 0842 05/08/12 0016  Weight: 250 lb (113.399 kg) 250 lb 3.6 oz (113.5 kg)   PHYSICAL EXAM  General: Pleasant, NAD. Neuro: Alert and oriented X 3. Moves all extremities spontaneously. Psych: Normal affect. HEENT:  Normal  Neck: Supple w/ bilat carotid bruits.  Neck thick - difficult to assess jvp. Lungs:  Resp regular and unlabored, few crackles @ bases. Heart: RRR, 3/6 sem heard throughout w/ 2/6 dm @ apex. Abdomen: Soft,  non-tender, protuberant, BS + x 4.  Extremities: No clubbing, cyanosis.  Chronic venous stasis changes with 1+ bilat LE edema. DP/PT/Radials 1+ and equal bilaterally.  Accessory Clinical Findings  CBC  Recent Labs  05/08/12 0620  WBC 5.2  HGB 11.0*  HCT 33.5*  MCV 82.1  PLT 224   Basic Metabolic Panel  Recent Labs  05/08/12 0620  NA 140  K 3.9  CL 101  CO2 28  GLUCOSE 184*  BUN 17  CREATININE 1.22  CALCIUM 9.1   TELE  Sb, 1st deg avb.  ECG  Sb, 56, poor r prog, st/t changes I, aVL, no acute st/t changes.  Radiology/Studies  No results found.  ASSESSMENT AND PLAN  1.  CAD:  S/p PCI/BMS x 2 to the RCA yesterday.  No chest pain overnight.  Cont asa, plavix, statin, bb, acei.  2.  Chronic Diast CHF:  Pt with evidence of volume overload on exam.  PCWP 33 yesterday.  Will diurese today.  HR well controlled.  BP up - follow with diuresis and consider additional agent if it remains elevated.  3.  Severe Aortic Stenosis:  S/p valvuloplasty w/ reduction in gradient from 47  to 27 initially.  Mean gradient yesterday = 20.  F/U TAVR clinic.  4.  Mitral Stenosis:  Gradient of 11.  Conservative Rx.  5.  CKD: creat stable.  6.  HTN:  As above.  Follow with diuresis.  7.  DM:  Cont lantus.  Add ssi.  Signed, [name] NP  Patient seen, examined. Available data reviewed. Agree with findings, assessment, and plan as outlined by [name], NP. Pt looks good after rotational atherectomy and stenting of the RCA yesterday. His intracardiac filling pressures remain markedly elevated. He will benefit from IV diuresis today and if he diureses well, will tentatively plan hospital discharge tomorrow. He will need to remain on ASA and plavix at least another 30 days and would continue on these meds until a decision is made regarding cardiac surgery, TAVR, or ongoing medical therapy.  [name], M.D. 05/08/2012 9:59 AM

## 2012-05-09 ENCOUNTER — Other Ambulatory Visit: Payer: Self-pay | Admitting: Nurse Practitioner

## 2012-05-09 ENCOUNTER — Encounter (HOSPITAL_COMMUNITY): Payer: Self-pay | Admitting: Nurse Practitioner

## 2012-05-09 DIAGNOSIS — I2510 Atherosclerotic heart disease of native coronary artery without angina pectoris: Secondary | ICD-10-CM

## 2012-05-09 DIAGNOSIS — E876 Hypokalemia: Secondary | ICD-10-CM

## 2012-05-09 LAB — BASIC METABOLIC PANEL
CO2: 28 mEq/L (ref 19–32)
Calcium: 9.8 mg/dL (ref 8.4–10.5)
Creatinine, Ser: 1.22 mg/dL (ref 0.50–1.35)
GFR calc non Af Amer: 59 mL/min — ABNORMAL LOW (ref >90)
Sodium: 137 mEq/L (ref 135–145)

## 2012-05-09 LAB — GLUCOSE, CAPILLARY
Glucose-Capillary: 71 mg/dL (ref 70–99)
Glucose-Capillary: 219 mg/dL — ABNORMAL HIGH (ref 70–99)

## 2012-05-09 MED ORDER — METFORMIN HCL 1000 MG PO TABS: 1000.0000 mg | ORAL_TABLET | Freq: Two times a day (BID) | ORAL | Status: DC

## 2012-05-09 MED ORDER — POTASSIUM CHLORIDE ER 20 MEQ PO TBCR: 20.0000 meq | EXTENDED_RELEASE_TABLET | Freq: Every day | ORAL | Status: DC

## 2012-05-09 MED ORDER — POTASSIUM CHLORIDE ER 10 MEQ PO TBCR: 20.0000 meq | EXTENDED_RELEASE_TABLET | Freq: Every day | ORAL | Status: DC

## 2012-05-09 MED ORDER — FUROSEMIDE 80 MG PO TABS
80.0000 mg | ORAL_TABLET | Freq: Every day | ORAL | Status: DC
  Administered 2012-05-09: 80 mg via ORAL
  Filled 2012-05-09: qty 1

## 2012-05-09 NOTE — Progress Notes (Signed)
Pt has been keeping record of urine output due to frequent urination and need to dump urinal often.  Has had 3850 cc out since two doses of IV lasix started 13 hours ago.  Medicated with percocet for moderate back pain with good result. Tele shows 1st degree hr 50, no ectopy noted.  Has BMET ordered for this AM.  BP down to 117/42.  Pt denies complaints, states legs feel much better.  Advised pt not to get up if dizzy and to call for assist if dizzy.  Wife at bedside, both voice understanding.

## 2012-05-09 NOTE — Discharge Summary (Signed)
Patient ID: [name] [name].,  MRN: [id number], DOB/AGE: [birth year] 70 y.o.  Admit date: 05/07/2012 Discharge date: 05/09/2012  Primary Care Provider: Crawford Givens Primary Cardiologist: Judie Petit. Excell Seltzer, MD  Discharge Diagnoses Principal Problem:   CAD (coronary artery disease)  **S/P rotational atherectomy and bare metal stenting of the RCA with placement of two 3.5 x 15 mm Multi[employer] Vision bare metal stents.  Active Problems:   Aortic stenosis  **S/P balloon valvuloplasty 04/2012   Chronic diastolic congestive heart failure   Type II or unspecified type diabetes mellitus with neurological manifestations, not stated as uncontrolled(250.60)   SLEEP APNEA   Mitral stenosis   Pulmonary hypertension   HYPERLIPIDEMIA   HYPERTENSION   Venous insufficiency (chronic) (peripheral)  Allergies Allergies  Allergen Reactions  . Adhesive (Tape) Rash  . Celecoxib Rash   Procedures  Cardiac Catheterization and Percutaneous Coronary Intervention 05/07/2012  Procedural Findings: Hemodynamics PA 97/32 with a mean of 54 PCWP V wave 44, A wave 37, mean 33 LV 168/18 AO 143/65 with a mean of 94  Oxygen saturations: PA 62 AO 97  Cardiac Output (Fick) 5.6  Cardiac Index (Fick) 2.4            Mitral valve hemodynamics: Mean gradient 11, mitral valve area 1.5 Aortic valve hemodynamics: Mean gradient 20, valve area 1.7  **The RCA was successfully treated with rotational atherectomy followed by placement of two 3.5 x 15mm Multi[employer] Vision bare-metal stents.  History of Present Illness  72 year old male with the above complex problem list who has recently undergone evaluation for his significant coronary artery disease as well as rheumatic heart disease including severe aortic stenosis and moderate to severe mitral stenosis.  After clinic visit on February 21, and review of recent diagnostic catheterization, and transesophageal echocardiogram, decision was made to pursue balloon valvuloplasty for  severe aortic stenosis to be followed by rotational atherectomy for right coronary artery disease. Patient underwent successful balloon valvuloplasty on February 24, with reduction and mean valve gradient by 20 mm mercury (47 mmHg to 27 mmHg). Arrangements were then made for elective percutaneous intervention of his severe right coronary artery stenosis.  Hospital Course  Patient presented to the [facility] cardiac catheterization laboratory on 05/07/2012. He underwent right heart cardiac catheterization revealing significant pulmonary hypertension and also elevated pulmonary capillary wedge pressure as outlined above.  Aortic valve hemodynamics were reassessed with a finding of a mean gradient of 20 and a valve area of 1.7. Attention was then turned to the right coronary artery which was successfully treated with rotational atherectomy followed by placement of 2 bare metal stents as outlined above. Patient tolerated procedure well and post procedure had significant improvement in his baseline level of dyspnea. Because of elevated filling pressures, he remained in the hospital on March 6 for IV diuresis. His weight has been reduced from 250-246 pounds with a net negative of approximately 2 L.  His renal function has remained stable though secondary to hypokalemia, we have added potassium supplementation at discharge.  He will be discharged home today in good condition and we will arrange for a f/u bmet in 1 week.  He will followup in the [facility] Multidisciplinary Valve clinic in 1 to 2 weeks.  Discharge Vitals Blood pressure 162/48, pulse 61, temperature 98.1 F (36.7 C), temperature source Oral, resp. rate 18, height 5' 10.5" (1.791 m), weight 246 lb 4.1 oz (111.7 kg), SpO2 95.00%.  Filed Weights   05/07/12 0842 05/08/12 0016 05/09/12 0045  Weight: 250 lb (113.399  kg) 250 lb 3.6 oz (113.5 kg) 246 lb 4.1 oz (111.7 kg)   Labs  CBC  Recent Labs  05/08/12 0620  WBC 5.2  HGB 11.0*  HCT 33.5*  MCV  82.1  PLT 224   Basic Metabolic Panel  Recent Labs  05/08/12 0620 05/09/12 0733  NA 140 137  K 3.9 3.8  CL 101 98  CO2 28 28  GLUCOSE 184* 216*  BUN 17 18  CREATININE 1.22 1.22  CALCIUM 9.1 9.8   Disposition  Pt is being discharged home today in good condition.  Follow-up Plans & Appointments      Follow-up Information   Follow up with [name], MD. (1-2 wks for f/u in Valve Clinic - they will be in touch.)    Contact information:   [address] [place] [address] [phone]      Discharge Medications    Medication List    TAKE these medications       aspirin 81 MG tablet  Take 81 mg by mouth daily.     clopidogrel 75 MG tablet  Commonly known as:  PLAVIX  Take 1 tablet (75 mg total) by mouth daily.     co-enzyme Q-10 30 MG capsule  Take 30 mg by mouth daily.     ezetimibe 10 MG tablet  Commonly known as:  ZETIA  Take 1 tablet (10 mg total) by mouth daily.     fenofibrate micronized 130 MG capsule  Commonly known as:  ANTARA  Take 1 capsule (130 mg total) by mouth daily before breakfast.     fish oil-omega-3 fatty acids 1000 MG capsule  Take 2 g by mouth 2 (two) times daily.     furosemide 80 MG tablet  Commonly known as:  LASIX  Take 1 tablet (80 mg total) by mouth daily.     LANTUS 100 UNIT/ML injection  Generic drug:  insulin glargine  Inject 80 Units into the skin 3 (three) times daily.     metFORMIN 1000 MG tablet - TO BE RESUMED 05/10/2012.  Commonly known as:  GLUCOPHAGE  Take 1 tablet (1,000 mg total) by mouth 2 (two) times daily with a meal. Resume tomorrow 3/8.     metoprolol tartrate 25 MG tablet  Commonly known as:  LOPRESSOR  Take 1 tablet (25 mg total) by mouth 2 (two) times daily.     nitroGLYCERIN 0.4 MG SL tablet  Commonly known as:  NITROSTAT  Place 1 tablet (0.4 mg total) under the tongue every 5 (five) minutes as needed for chest pain. May repeat up to 3 times as needed     pantoprazole  40 MG tablet  Commonly known as:  PROTONIX  Take 1 tablet (40 mg total) by mouth daily.     Potassium Chloride ER 20 MEQ Tbcr  Take 20 mEq by mouth daily.     ramipril 10 MG tablet  Commonly known as:  ALTACE  Take 1 tablet (10 mg total) by mouth daily.     repaglinide 2 MG tablet  Commonly known as:  PRANDIN  Take 2 mg by mouth 3 (three) times daily before meals.     rosuvastatin 20 MG tablet  Commonly known as:  CRESTOR  Take 1 tablet (20 mg total) by mouth at bedtime.     sertraline 50 MG tablet  Commonly known as:  ZOLOFT  Take 1 tablet (50 mg total) by mouth daily.     Vitamin D (Ergocalciferol)  50000 UNITS Caps  Commonly known as:  DRISDOL  Take 50,000 Units by mouth every 7 (seven) days.       Outstanding Labs/Studies  bmet in 1 week.  Duration of Discharge Encounter   Greater than 30 minutes including physician time.  Signed, [name] NP 05/09/2012, 9:44 AM

## 2012-05-09 NOTE — Progress Notes (Signed)
CARDIAC REHAB PHASE I      1000-1010 Pt plan to dc home today.  Pt declined hallway ambulation as pt is dressing. Answered pt questions about outpatient cardiac rehab.  Pt requests referral to [facility].  Referral will be sent.  [place], [place]

## 2012-05-09 NOTE — Progress Notes (Addendum)
    Subjective:  Feels tired today but breathing is better. No chest pain. No dizziness.  Objective:  Vital Signs in the last 24 hours: Temp:  [97.2 F (36.2 C)-98.3 F (36.8 C)] 98.1 F (36.7 C) (03/07 0740) Pulse Rate:  [50-64] 61 (03/07 0740) Resp:  [18-20] 18 (03/07 0740) BP: (103-176)/(39-64) 162/48 mmHg (03/07 0740) SpO2:  [92 %-96 %] 95 % (03/07 0740) Weight:  [111.7 kg (246 lb 4.1 oz)] 111.7 kg (246 lb 4.1 oz) (03/07 0045)  Intake/Output from previous day: 03/06 0701 - 03/07 0700 In: 1400 [P.O.:1400] Out: 4425 [Urine:4425]  Physical Exam: Pt is alert and oriented, NAD HEENT: normal Neck: JVP - moderately elevated Lungs: CTA bilaterally CV: RRR with grade 2/6 systolic murmur at the left sternal border Abd: soft, NT, Positive BS, no hepatomegaly Ext: Diffuse 1+ edema and erythema, unchanged from baseline  Lab Results:  Recent Labs  05/08/12 0620  WBC 5.2  HGB 11.0*  PLT 224    Recent Labs  05/08/12 0620 05/09/12 0733  NA 140 137  K 3.9 3.8  CL 101 98  CO2 28 28  GLUCOSE 184* 216*  BUN 17 18  CREATININE 1.22 1.22   No results found for this basename: TROPONINI, CK, MB,  in the last 72 hours  Tele: Sinus bradycardia, personally reviewed   Assessment/Plan:  1. Coronary atherosclerosis, status post rotational atherectomy and PCI of the right coronary artery. He remained stable. He should continue on aspirin and Plavix. He will be seen back in the multidisciplinary valve clinic within 2 weeks.  2. Acute on chronic diastolic heart failure. The patient has diuresed very well overnight. Will convert back to oral furosemide 80 mg daily. He was instructed to do daily weights. If weight increases by 3 pounds will take a second furosemide in the afternoon. Would add K-Dur 20 mEq daily and followup with a metabolic panel in about 1 week.  3. Severe aortic stenosis. Hemodynamics were assessed at cardiac catheterization and he has had a reasonably good result  with balloon aortic valvuloplasty. Current hemodynamics suggest moderate aortic stenosis.  4. Mitral stenosis, rheumatic. Gradient of 11. This is suggestive of moderate mitral stenosis. Will review when he returns to valve clinic.  5. Chronic kidney disease, stage III. His renal function remains stable with a creatinine of 1.2 mg/dL today.  Disposition: Home today, followup in valve clinic within 2 weeks. We will call him with the appointment.  [name], M.D. 05/09/2012, 9:31 AM

## 2012-05-22 ENCOUNTER — Encounter: Payer: Self-pay | Admitting: Thoracic Surgery (Cardiothoracic Vascular Surgery)

## 2012-05-23 ENCOUNTER — Encounter (HOSPITAL_COMMUNITY): Payer: Self-pay | Admitting: Cardiovascular Disease

## 2012-05-23 ENCOUNTER — Ambulatory Visit (HOSPITAL_COMMUNITY)
Admission: RE | Admit: 2012-05-23 | Discharge: 2012-05-23 | Disposition: A | Discharge status: Home / Self Care | Payer: Medicare Other | Source: Ambulatory Visit | Attending: Cardiovascular Disease | Admitting: Cardiovascular Disease

## 2012-05-23 ENCOUNTER — Encounter (HOSPITAL_COMMUNITY): Payer: Medicare Other | Admitting: Thoracic Surgery (Cardiothoracic Vascular Surgery)

## 2012-05-23 VITALS — BP 136/59 | HR 71 | Resp 20 | Ht 70.0 in | Wt 241.0 lb

## 2012-05-23 DIAGNOSIS — I350 Nonrheumatic aortic (valve) stenosis: Secondary | ICD-10-CM

## 2012-05-23 DIAGNOSIS — I359 Nonrheumatic aortic valve disorder, unspecified: Secondary | ICD-10-CM | POA: Insufficient documentation

## 2012-05-23 LAB — BASIC METABOLIC PANEL
CO2: 24 mEq/L (ref 19–32)
Calcium: 9.9 mg/dL (ref 8.4–10.5)
GFR calc non Af Amer: 47 mL/min — ABNORMAL LOW (ref >90)
Glucose, Bld: 164 mg/dL — ABNORMAL HIGH (ref 70–99)
Potassium: 4.1 mEq/L (ref 3.5–5.1)
Sodium: 137 mEq/L (ref 135–145)

## 2012-05-24 ENCOUNTER — Encounter (HOSPITAL_COMMUNITY): Payer: Self-pay | Admitting: Cardiovascular Disease

## 2012-05-24 NOTE — Progress Notes (Signed)
MULTIDISCIPLINARY HEART VALVE CLINIC NOTE  Patient ID: [name] [name]. MRN: [id number] DOB/AGE: August 13, 1940 72 y.o.  Primary Care Physician:Graham Para March, MD Primary Cardiologist: Juanito Doom, MD  HPI: 72 year-old gentleman returning for follow-up evaluation. He has rheumatic heart disease with mitral and aortic stenosis, severe CAD s/p CABG and recent rotational atherectomy of the RCA with bare-metal stenting, and severe pulmonary hypertension. He underwent balloon aortic valvuloplasty February 24th.   He is feeling much better over the past few weeks. His shortness of breath is greatly improved and he had his 'best day in years' yesterday. He was able to go to a horse show and do a good bit of walking. He denies orthopnea, PND, chest pain, lightheadedness, or syncope. His chronic leg swelling is improved. He would like to stop taking KDur because of difficulty swallowing them.  Past Medical History  Diagnosis Date  . GERD (gastroesophageal reflux disease)   . Hyperlipidemia   . Hypertension   . Diabetes mellitus   . Carotid artery disease   . Cellulitis and abscess of leg, except foot   . Leg edema   . Right knee sprain   . Cramps, muscle, general   . Low back pain   . Hiatal hernia   . Chills   . Hearing loss   . Nasal congestion   . Cough   . Wheezing   . Generalized headaches   . CAD (coronary artery disease) 11/05/1996    a. CABG x6 by Dr Andrey Campanile - LIMA to Diag+LAD, SVG to OM1+OM2, SVG to PDA+RPL, b. VG->RCA known to be occluded;  c. 05/2012 PCI/Rota/BMS to m/dRCA w/ 3.5x15 Vision BMS x 2 post-dil to 3.75.  Marland Kitchen Mitral stenosis     a. moderate by echo 04/2012.  Marland Kitchen Chronic diastolic congestive heart failure     a. 04/2012 Echo: EF 55-60-%  . Aortic stenosis     a. 04/2012 s/p baloon valvuloplasty w/ reduction in gradient from 47 to 27.  . Pulmonary hypertension     a. 05/2012 RH cath: PA 97/32(54)  . Venous insufficiency (chronic) (peripheral)   . Sleep apnea     no cpap  sleep  study >5 yrs    Past Surgical History  Procedure Laterality Date  . Ett cardiolite  12/31/2006    Mild-Mod distal inf/apical ischemia  . Open heart surgery    . Acute or chronic diastolic hf  8/6/ - 10/11/2008    RLE cellulitis - HOSP  . Doppler echocardiography  10/11/2008    Mild AS, Mild-Mod MS, Severe LVH  . Le arterial and venous US  10/11/2008    Art clear.  Venous Neg DVT  . Appendectomy  12/25/2010    Dr Andrey Campanile  . Umbilical hernia repair  12/25/2010    Dr Andrey Campanile  . Hernia repair  12/25/10    umbilical hernia repair   . Tee without cardioversion  03/20/2012    Procedure: TRANSESOPHAGEAL ECHOCARDIOGRAM (TEE);  Surgeon: Laurey Morale, MD;  Location: [facility] ENDOSCOPY;  Service: Cardiovascular;  Laterality: N/A;    Family History  Problem Relation Age of Onset  . Diabetes Other     History   Social History  . Marital Status: Married    Spouse Name: N/A    Number of Children: N/A  . Years of Education: N/A   Occupational History  . Not on file.   Social History Main Topics  . Smoking status: Never Smoker   . Smokeless tobacco: Never Used  .  Alcohol Use: No  . Drug Use: No  . Sexually Active: Not on file   Other Topics Concern  . Not on file   Social History Narrative  . No narrative on file      (Not in a hospital admission)  Allergies  Allergen Reactions  . Adhesive (Tape) Rash  . Celecoxib Rash   BP 136/59  Pulse 71  Resp 20  Ht 5\' 10"  (1.778 m)  Wt 241 lb (109.317 kg)  BMI 34.58 kg/m2  SpO2 94%  PHYSICAL EXAM: Pt is alert and oriented, pleasant obese male, in no distress. HEENT: normal Neck: JVP normal. Carotid upstrokes normal without bruits. No thyromegaly. Lungs: equal expansion, clear bilaterally CV: Apex is discrete and nondisplaced, RRR with grade 2/6 systolic murmur at the RUSB Abd: soft, NT, +BS, no bruit, no hepatosplenomegaly Back: no CVA tenderness Ext: 1+ pretibial edema bilaterally with mild erythema of the distal legs Skin: stasis  ulcers improved Neuro: CNII-XII intact             Strength intact = bilaterally  2D ECHO - post-valvuloplasty Left ventricle: The cavity size was normal. Wall thickness was increased in a pattern of severe LVH. Systolic function was normal. The estimated ejection fraction was in the range of 55% to 60%. Regional wall motion abnormalities cannot be excluded.  ------------------------------------------------------------ Aortic valve: Trileaflet. Valve mobility was restricted. Doppler: There was moderate stenosis. No regurgitation. VTI ratio of LVOT to aortic valve: 0.44. Valve area: 0.79cm^2(VTI). Indexed valve area: 0.34cm^2/m^2 (VTI). Peak velocity ratio of LVOT to aortic valve: 0.43. Valve area: 0.76cm^2 (Vmax). Indexed valve area: 0.33cm^2/m^2 (Vmax). Mean gradient: 29mm Hg (S). Peak gradient: 50mm Hg (S).  ------------------------------------------------------------ Aorta: Aortic root: The aortic root was normal in size.  ------------------------------------------------------------ Mitral valve: Severely calcified annulus. Doppler: The findings are consistent with moderate stenosis. Trivial regurgitation. Valve area by pressure half-time: 1.49cm^2. Indexed valve area by pressure half-time: 0.65cm^2/m^2. Valve area by continuity equation (using LVOT flow): 0.55cm^2. Indexed valve area by continuity equation (using LVOT flow): 0.24cm^2/m^2. Mean gradient: 13mm Hg (D). Peak gradient: 36mm Hg (D).  ------------------------------------------------------------ Left atrium: The atrium was moderately dilated.  ------------------------------------------------------------ Right ventricle: The cavity size was normal. Systolic function was normal.  ------------------------------------------------------------ Pulmonic valve: Doppler: Transvalvular velocity was within the normal range. There was no evidence for  stenosis.  ------------------------------------------------------------ Tricuspid valve: Structurally normal valve. Doppler: Transvalvular velocity was within the normal range. No regurgitation.  ------------------------------------------------------------ Right atrium: The atrium was mildly dilated.  ------------------------------------------------------------ Pericardium: There was no pericardial effusion.  ------------------------------------------------------------ Systemic veins: Inferior vena cava: The vessel was mildly dilated.  ------------------------------------------------------------  2D measurements Normal Doppler measurements Norma Left ventricle l LVID ED, 37.1 mm 43-52 Left ventricle chord, Ea, lat 5.37 cm/s ----- PLAX ann, tiss LVID ES, 21.5 mm 23-38 DP chord, E/Ea, lat 55.5 ----- PLAX ann, tiss FS, chord, 42 % >29 DP PLAX Ea, med 5.04 cm/s ----- LVPW, ED 17.8 mm ------ ann, tiss IVS/LVPW 0.93 <1.3 DP ratio, ED E/Ea, med 59.13 ----- Ventricular septum ann, tiss IVS, ED 16.5 mm ------ DP LVOT LVOT Diam, S 15 mm ------ Peak vel, 152 cm/s ----- Area 1.77 cm^2 ------ S Aorta VTI, S 38.5 cm ----- Root diam, 28 mm ------ Peak 9 mm Hg ----- ED gradient, Left atrium S AP dim 53 mm ------ Aortic valve AP dim 2.31 cm/m^2 <2.2 Peak vel, 352 cm/s ----- index S Vol, S 118 ml ------ Mean vel, 243 cm/s ----- Vol index, 51.5 ml/m^2 ------ S  S VTI, S 86.6 cm ----- Mean 29 mm Hg ----- gradient, S Peak 50 mm Hg ----- gradient, S VTI ratio 0.44 ----- LVOT/AV Area, VTI 0.79 cm^2 ----- Area 0.34 cm^2/m ----- index ^2 (VTI) Peak vel 0.43 ----- ratio, LVOT/AV Area, 0.76 cm^2 ----- Vmax Area 0.33 cm^2/m ----- index ^2 (Vmax) Mitral valve Peak E 298.02 cm/s ----- vel Peak A 171 cm/s ----- vel Mean vel, 162 cm/s ----- D Decelerat 607.23 cm/s^2 ----- ion slope Decelerat 491 ms 150-2 ion time 30 Pressure 148 ms ----- half-time Mean 13 mm Hg  ----- gradient, D Peak 36 mm Hg ----- gradient, D Peak E/A 1.74 ----- ratio Area 1.49 cm^2 ----- (PHT) Area 0.65 cm^2/m ----- index ^2 (PHT) Area 0.55 cm^2 ----- (LVOT) continuit y Area 0.24 cm^2/m ----- index ^2 (LVOT cont) Annulus 124 cm ----- VTI Systemic veins Estimated 10 mm Hg ----- CVP Right ventricle Sa vel, 7.46 cm/s ----- lat ann, tiss DP     CARDIAC CATH: 05/07/2012 Procedure: Right Heart Cath, Left Heart Cath, temporary transvenous pacemaker placement; rotational atherectomy, PTCA, and stenting of the right coronary artery; and a Perclose of right femoral artery  Indication: Aortic stenosis, mitral stenosis, pulmonary hypertension, congestive heart failure, and severe coronary artery disease. Mr. Brooking is a very complex 72 year old gentleman with extensive CAD, rheumatic heart disease, and prior bypass surgery. Please see previous notes for full details. He underwent balloon aortic valvuloplasty last week. He presents today for rotational atherectomy of the RCA. He is being considered for TAVR versus open redo surgical valve replacement. Plan today for right and left heart catheterization to assess mitral stenosis as well as PCI to right coronary artery. The RCA is severely calcified and diffusely diseased. There are 2 areas of very severe stenosis in the mid and distal vessel and I have plan on treating him with bare-metal stents so that the patient can discontinue Plavix after 30 days and move forward with further surgical intervention.  Procedural Details: The right groin was prepped, draped, and anesthetized with 1% lidocaine. Using a front wall puncture, the right femoral artery was accessed with a 0.035 inch wire. The vessel was pre-closed and then an 8 French sheath was placed. The right femoral vein was accessed and a 7 French sheath was placed. A Swan-Ganz catheter was used for right heart catheterization. An AL-1 catheter and straight-tip wire were used to  cross the aortic valve and this was changed out to a pigtail catheter over an exchange length J-wire. Simultaneous pressures were recorded and the pulmonary capillary wedge and left ventricular tracings. A pullback across the aortic valve was performed..  A Swan-Ganz catheter was removed and a temporary transvenous pacing wire was advanced into the RV apex. The pacing threshold was less than 0.8 milliamps. An 8 [place] JR 4 guide catheter was inserted. Angiomax was used for anticoagulation. Once a therapeutic ACT was achieved a Rotafloppy wire was advanced into the distal right coronary artery. There were 2 severe lesions, one in the mid RCA and another in the distal RCA. The entire vessel was very severely calcified. The mid vessel has a focal 90% stenosis in the junction of the mid/distal vessel has another focal 90% stenosis. After platform he outside the body at 165,000 rpm's, the 1.5 mm Rotablator burr was advanced and 2 runs were performed through the right coronary artery. This was upsized to a 2 mm bur and again repeated runs were done. This didn't good bit of debulking. At that point I  advanced a 3.0 x 15 mm balloon over the Rotablator wire and this was exchanged out for a long prolonged wire. Balloon inflations were performed over each lesion to 12 atmospheres. The distal lesion was then stented with a 3.5 x 15 mm MultiLink vision stent. The mid lesion was stented with a 3.5 x 15 mm MultiLink vision bare metal stent. The distal stent was deployed at 14 atmospheres, the mid stent was deployed at 16 atmospheres. Both stents were postdilated with a 3.75 x 12 mm noncompliant balloon to 16 atmospheres. There was a good angiographic result with no residual stenosis at either stent site. The patient tolerated the procedure well. There were no immediate complications. At the completion of the procedure the Perclose device was used for femoral hemostasis. There was residual oozing from the groin site and a FemoStop  will probably have to be placed.  Procedural Findings:  Hemodynamics  PA 97/32 with a mean of 54  PCWP V wave 44, A wave 37, mean 33  LV 168/18  AO 143/65 with a mean of 94  Oxygen saturations:  PA 62  AO 97  Cardiac Output (Fick) 5.6  Cardiac Index (Fick) 2.4  Mitral valve hemodynamics: Mean gradient 11, mitral valve area 1.5  Aortic valve hemodynamics: Mean gradient 20, valve area 1.7  Lesion 1:  RCA/mid  Baseline 90% stenosis with heavy calcification  Baseline TIMI flow 3  Stent: 3.5 x 15 mm bare-metal  Post stent 0% stenosis  Post stent TIMI flow 3  Lesion 2:  RCA/distal  Baseline 90% stenosis with heavy calcification  Baseline TIMI flow 3  Stent: 3.5 x 15 mm bare-metal  Post stent 0% stenosis  Post stent TIMI flow 3  Final Conclusions:  1. Moderate residual aortic stenosis status post balloon aortic valvuloplasty  2. Moderate mitral stenosis  3. Severe pulmonary hypertension  4. Successful rotational atherectomy and stenting of the right coronary artery using bare-metal stents.  Recommendations: Will review the patient's clinical situation and hemodynamics began with Dr. Cornelius Moras. We will see him back in the multidisciplinary valve clinic. He will remain on aspirin and Plavix for a minimum of 30 days following PCI. His hemodynamics suggest moderate residual aortic stenosis. I think his aortic valve area is over calculated at 1.7 and I'm sure it is much tighter than this. The mitral valve appears to have moderate stenosis based on simultaneous pressure recordings and pulmonary capillary wedge and LV.  [name]  05/07/2012, 1:29 PM  ASSESSMENT AND PLAN:  72 year-old male with severe symptomatic aortic stenosis, and comorbid conditions of moderate mitral stenosis, diastolic heart failure, extensive CAD with hx of CABG and recent PCI, obesity, longstanding diabetes, pulmonary HTN, and venous stasis with chronic lower extremity ulcers.  He has undergone extensive evaluation  and treatment with recent BAV and PCI of the right coronary artery. He has gained marked symptomatic improvement and is feeling better than he has in a long time.   He is here with his wife today. We have discussed potential treatment options for his valvular heart disease in detail today. These include continued medical therapy, transcatheter AVR, and open surgical valve replacement (aortic and mitral). We have discussed with risks, limitations, and benefits of each approach. He understands that open surgery would provide the most definitive treatment and is the only way to realistically treat his mitral valve stenosis. However this would come at very high-risk considering his comorbid conditions, prior cardiac surgery, and severe pulmonary HTN.   He wishes  to proceed with further evaluation for TAVR. We will arrange a gated cardiac CTA for evaluation of his aortic annular size and anatomy, and an abdominal/pelvic CTA to assess for iliofemoral access. Because of mild CKD, we will ask him to hold lasix and ramipril 24 hours before the CT and metformin per protocol.   Will arrange follow-up with Dr Cornelius Moras and I in the multidisciplinary valve clinic after his CT is completed.  Time spent greater than 40 minutes in today's evaluation and discussion with the patient and his family.  [name] 05/24/2012 7:17 AM

## 2012-05-26 ENCOUNTER — Other Ambulatory Visit: Payer: Self-pay | Admitting: *Deleted

## 2012-05-26 DIAGNOSIS — L0390 Cellulitis, unspecified: Secondary | ICD-10-CM

## 2012-05-26 DIAGNOSIS — I359 Nonrheumatic aortic valve disorder, unspecified: Secondary | ICD-10-CM

## 2012-05-26 DIAGNOSIS — I509 Heart failure, unspecified: Secondary | ICD-10-CM

## 2012-05-26 DIAGNOSIS — I059 Rheumatic mitral valve disease, unspecified: Secondary | ICD-10-CM

## 2012-05-26 DIAGNOSIS — I272 Pulmonary hypertension, unspecified: Secondary | ICD-10-CM

## 2012-05-30 ENCOUNTER — Other Ambulatory Visit: Payer: Self-pay | Admitting: *Deleted

## 2012-05-30 MED ORDER — SERTRALINE HCL 50 MG PO TABS: 50.0000 mg | ORAL_TABLET | Freq: Every day | ORAL | Status: DC

## 2012-06-04 ENCOUNTER — Other Ambulatory Visit (HOSPITAL_COMMUNITY): Payer: Medicare Other

## 2012-06-04 ENCOUNTER — Encounter (HOSPITAL_COMMUNITY): Payer: Self-pay

## 2012-06-04 ENCOUNTER — Ambulatory Visit (HOSPITAL_COMMUNITY): Admission: RE | Admit: 2012-06-04 | Payer: Medicare Other | Source: Ambulatory Visit

## 2012-06-04 ENCOUNTER — Ambulatory Visit (HOSPITAL_COMMUNITY)
Admission: RE | Admit: 2012-06-04 | Discharge: 2012-06-04 | Disposition: A | Discharge status: Home / Self Care | Payer: Medicare Other | Source: Ambulatory Visit | Attending: Cardiovascular Disease | Admitting: Cardiovascular Disease

## 2012-06-04 DIAGNOSIS — I059 Rheumatic mitral valve disease, unspecified: Secondary | ICD-10-CM

## 2012-06-04 DIAGNOSIS — I2510 Atherosclerotic heart disease of native coronary artery without angina pectoris: Secondary | ICD-10-CM | POA: Insufficient documentation

## 2012-06-04 DIAGNOSIS — I272 Pulmonary hypertension, unspecified: Secondary | ICD-10-CM

## 2012-06-04 DIAGNOSIS — N281 Cyst of kidney, acquired: Secondary | ICD-10-CM | POA: Insufficient documentation

## 2012-06-04 DIAGNOSIS — I509 Heart failure, unspecified: Secondary | ICD-10-CM

## 2012-06-04 DIAGNOSIS — I079 Rheumatic tricuspid valve disease, unspecified: Secondary | ICD-10-CM | POA: Insufficient documentation

## 2012-06-04 DIAGNOSIS — K5730 Diverticulosis of large intestine without perforation or abscess without bleeding: Secondary | ICD-10-CM | POA: Insufficient documentation

## 2012-06-04 DIAGNOSIS — K4090 Unilateral inguinal hernia, without obstruction or gangrene, not specified as recurrent: Secondary | ICD-10-CM | POA: Insufficient documentation

## 2012-06-04 DIAGNOSIS — L0390 Cellulitis, unspecified: Secondary | ICD-10-CM

## 2012-06-04 DIAGNOSIS — I359 Nonrheumatic aortic valve disorder, unspecified: Secondary | ICD-10-CM

## 2012-06-04 DIAGNOSIS — J8410 Pulmonary fibrosis, unspecified: Secondary | ICD-10-CM | POA: Insufficient documentation

## 2012-06-04 DIAGNOSIS — I771 Stricture of artery: Secondary | ICD-10-CM | POA: Insufficient documentation

## 2012-06-04 DIAGNOSIS — I700 Atherosclerosis of aorta: Secondary | ICD-10-CM | POA: Insufficient documentation

## 2012-06-04 MED ORDER — IOHEXOL 350 MG/ML SOLN
80.0000 mL | Freq: Once | INTRAVENOUS | Status: AC | PRN
  Administered 2012-06-04: 80 mL via INTRAVENOUS

## 2012-06-04 MED ORDER — IOHEXOL 350 MG/ML SOLN
100.0000 mL | Freq: Once | INTRAVENOUS | Status: AC | PRN
  Administered 2012-06-04: 80 mL via INTRAVENOUS

## 2012-06-27 ENCOUNTER — Encounter (HOSPITAL_COMMUNITY): Payer: Self-pay | Admitting: Cardiovascular Disease

## 2012-06-27 ENCOUNTER — Ambulatory Visit (HOSPITAL_COMMUNITY)
Admission: RE | Admit: 2012-06-27 | Discharge: 2012-06-27 | Disposition: A | Discharge status: Home / Self Care | Payer: Medicare Other | Source: Ambulatory Visit | Attending: Cardiovascular Disease | Admitting: Cardiovascular Disease

## 2012-06-27 ENCOUNTER — Encounter (HOSPITAL_COMMUNITY): Payer: Self-pay | Admitting: Thoracic Surgery (Cardiothoracic Vascular Surgery)

## 2012-06-27 ENCOUNTER — Ambulatory Visit (HOSPITAL_COMMUNITY)
Admission: RE | Admit: 2012-06-27 | Discharge: 2012-06-27 | Disposition: A | Discharge status: Home / Self Care | Payer: Medicare Other | Source: Ambulatory Visit | Attending: Thoracic Surgery (Cardiothoracic Vascular Surgery) | Admitting: Thoracic Surgery (Cardiothoracic Vascular Surgery)

## 2012-06-27 VITALS — BP 112/52 | HR 68 | Resp 20 | Ht 66.0 in | Wt 245.0 lb

## 2012-06-27 DIAGNOSIS — I359 Nonrheumatic aortic valve disorder, unspecified: Secondary | ICD-10-CM

## 2012-06-27 NOTE — Progress Notes (Signed)
[facility]   MULTIDISCIPLINARY HEART VALVE CLINIC       CARDIOTHORACIC SURGERY NOTE  Referring Provider is Wall, Jesse Sans, MD PCP is Crawford Givens, MD   HPI:  Patient returns for followup of severe symptomatic aortic stenosis, mitral stenosis, coronary artery disease, and chronic diastolic congestive heart failure. He was originally seen in consultation on 03/10/2012 and I last saw him in the office on 04/07/2012.  Since then he underwent balloon aortic valvuloplasty by Dr. Excell Seltzer on 04/28/2012. His mean transvalvular gradient at catheterization decreased from 47 to 27 mm mercury.  He did well following this and return for repeat catheterization with PCI and stenting of the right coronary artery using a bare metal stent on 05/07/2012. At that time his aortic stenosis remained stable with mean transvalvular gradient measured 20 mm mercury at cath. Mean transvalvular gradient across the mitral valve was 11 mm mercury. Pulmonary artery pressures measured 97/32 with mean pulmonary capillary wedge pressure 33.  Since then the patient has done well clinically and reported significant symptomatic improvement. He still has significant exertional shortness of breath, but he knows that he feels better than he has for quite some time. He has not had any recurrent chest pain or chest tightness. He denies resting shortness of breath. He has severe chronic bilateral lower extremity edema.  To further explore whether or not transcatheter aortic valve replacement might prove to be a reasonable long-term solution, the patient has recently undergone cardiac gated CT angiogram of the heart. He returns to the office today to discuss the results of this test and review treatment options.   Current Outpatient Prescriptions  Medication Sig Dispense Refill  . aspirin 81 MG tablet Take 81 mg by mouth daily.        . clopidogrel (PLAVIX) 75 MG tablet Take 1 tablet (75 mg total) by mouth daily.  30 tablet   5  . co-enzyme Q-10 30 MG capsule Take 30 mg by mouth daily.       Marland Kitchen ezetimibe (ZETIA) 10 MG tablet Take 1 tablet (10 mg total) by mouth daily.  30 tablet  5  . fenofibrate micronized (ANTARA) 130 MG capsule Take 1 capsule (130 mg total) by mouth daily before breakfast.  30 capsule  5  . fish oil-omega-3 fatty acids 1000 MG capsule Take 2 g by mouth 2 (two) times daily.        . furosemide (LASIX) 80 MG tablet Take 1 tablet (80 mg total) by mouth daily.  30 tablet  5  . insulin glargine (LANTUS) 100 UNIT/ML injection Inject 80 Units into the skin 3 (three) times daily.       Marland Kitchen JANUVIA 100 MG tablet       . LOVAZA 1 G capsule       . metFORMIN (GLUCOPHAGE) 1000 MG tablet Take 1 tablet (1,000 mg total) by mouth 2 (two) times daily with a meal. Resume tomorrow 3/8.      . metoprolol (LOPRESSOR) 25 MG tablet Take 1 tablet (25 mg total) by mouth 2 (two) times daily.  60 tablet  5  . nitroGLYCERIN (NITROSTAT) 0.4 MG SL tablet Place 1 tablet (0.4 mg total) under the tongue every 5 (five) minutes as needed for chest pain. May repeat up to 3 times as needed  30 tablet  5  . pantoprazole (PROTONIX) 40 MG tablet Take 1 tablet (40 mg total) by mouth daily.  90 tablet  0  . potassium chloride 20 MEQ TBCR  Take 20 mEq by mouth daily.  30 tablet  6  . potassium chloride SA (K-DUR,KLOR-CON) 20 MEQ tablet       . ramipril (ALTACE) 10 MG tablet Take 1 tablet (10 mg total) by mouth daily.  30 tablet  5  . ranitidine (ZANTAC) 150 MG tablet       . repaglinide (PRANDIN) 2 MG tablet Take 2 mg by mouth 3 (three) times daily before meals.        . rosuvastatin (CRESTOR) 20 MG tablet Take 1 tablet (20 mg total) by mouth at bedtime.  30 tablet  6  . sertraline (ZOLOFT) 50 MG tablet Take 1 tablet (50 mg total) by mouth daily.  90 tablet  0  . Vitamin D, Ergocalciferol, (DRISDOL) 50000 UNITS CAPS Take 50,000 Units by mouth every 7 (seven) days.        . [DISCONTINUED] promethazine (PHENERGAN) 25 MG tablet Take 1/2 to 1  tablet up to every 6 hours as needed for nausea.        No current facility-administered medications for this encounter.      Physical Exam:   BP 112/52  Pulse 68  Resp 20  Ht 5\' 6"  (1.676 m)  Wt 245 lb (111.131 kg)  BMI 39.56 kg/m2  SpO2 94%  General:  Obese but pleasant in NAD  Chest:   clear  CV:   RRR  Incisions:  n/a  Abdomen:  soft  Extremities:  Warm w/ severe bilateral LE edema  Diagnostic Tests:  Cardiac Catheterization Procedure Note   Name: [name] [name].  MRN: [id number]  DOB: 05-18-1940  Procedure: Left Heart Cath, temporary transvenous pacemaker placement, balloon aortic valvuloplasty, Perclose of the right femoral artery  Indication: Severe symptomatic aortic stenosis  Procedural details: The right groin was prepped, draped, and anesthetized with 1% lidocaine. Using the modified Seldinger technique, a 6 French sheath was introduced into the right femoral artery. A 7 Fr sheath was inserted into the right femoral vein for planned placement of a pacing wire. Preclose of the RFA was attempted but only the first device deplyed properly. The second 2 devices did not capture the artery appropriately. The suture was secured with hemostats and a 12 Fr sheath was advanced into the RFA. The balloon tipped pacing wire was inserted into the RV apex and pacing was tested with appropriate capture (threshold less than 1 mA). An AL-2 catheter was used to direct a straight tip wire into the LV and a Langston pigtail catheter was used to record simultaneous pressures. An Amplatz extra-stiff wire was used to exchange out for a 22 mm True balloon. Valvuloplasty was performed with rapid ventricular pacing at 180 bpm on consecutive balloon inflations after the patient's BP recovered. The pigtail was again advanced and pressure gradient was recorded. The patient tolerated the procedure well. The pigtail catheter and temp pacing wire were both removed. Protamine was administered at a dose of 50  mg. The arterial sheath was removed and the Perclose was deployed. There were no immediate procedural complications. The patient was transferred to the post catheterization recovery area for further monitoring.  Procedural Findings:  Hemodynamics:  BASELINE:  AO 118/63  LV 159/29  MEAN Gradient: 47 mm Hg  POST-BAV:  AO 130/69  LV 155/23  MEAN Gradient: 27 mm Hg  Final Conclusions: Successful balloon aortic valvuloplasty  Recommendations: Repeat echo in AM, staged PCI of the RCA probably next week, reassess Mitral Valve gradient with careful echo plus/minus direct measurement  at time of PCI.  [name]  04/28/2012, 9:51 AM    Cardiac Catheterization Procedure Note   Name: [name] [name].  MRN: [id number]  DOB: 1940-10-15  Procedure: Right Heart Cath, Left Heart Cath, temporary transvenous pacemaker placement; rotational atherectomy, PTCA, and stenting of the right coronary artery; and a Perclose of right femoral artery  Indication: Aortic stenosis, mitral stenosis, pulmonary hypertension, congestive heart failure, and severe coronary artery disease. Mr. Michalec is a very complex 72 year old gentleman with extensive CAD, rheumatic heart disease, and prior bypass surgery. Please see previous notes for full details. He underwent balloon aortic valvuloplasty last week. He presents today for rotational atherectomy of the RCA. He is being considered for TAVR versus open redo surgical valve replacement. Plan today for right and left heart catheterization to assess mitral stenosis as well as PCI to right coronary artery. The RCA is severely calcified and diffusely diseased. There are 2 areas of very severe stenosis in the mid and distal vessel and I have plan on treating him with bare-metal stents so that the patient can discontinue Plavix after 30 days and move forward with further surgical intervention.  Procedural Details: The right groin was prepped, draped, and anesthetized with 1% lidocaine.  Using a front wall puncture, the right femoral artery was accessed with a 0.035 inch wire. The vessel was pre-closed and then an 8 French sheath was placed. The right femoral vein was accessed and a 7 French sheath was placed. A Swan-Ganz catheter was used for right heart catheterization. An AL-1 catheter and straight-tip wire were used to cross the aortic valve and this was changed out to a pigtail catheter over an exchange length J-wire. Simultaneous pressures were recorded and the pulmonary capillary wedge and left ventricular tracings. A pullback across the aortic valve was performed..  A Swan-Ganz catheter was removed and a temporary transvenous pacing wire was advanced into the RV apex. The pacing threshold was less than 0.8 milliamps. An 8 [place] JR 4 guide catheter was inserted. Angiomax was used for anticoagulation. Once a therapeutic ACT was achieved a Rotafloppy wire was advanced into the distal right coronary artery. There were 2 severe lesions, one in the mid RCA and another in the distal RCA. The entire vessel was very severely calcified. The mid vessel has a focal 90% stenosis in the junction of the mid/distal vessel has another focal 90% stenosis. After platform he outside the body at 165,000 rpm's, the 1.5 mm Rotablator burr was advanced and 2 runs were performed through the right coronary artery. This was upsized to a 2 mm bur and again repeated runs were done. This didn't good bit of debulking. At that point I advanced a 3.0 x 15 mm balloon over the Rotablator wire and this was exchanged out for a long prolonged wire. Balloon inflations were performed over each lesion to 12 atmospheres. The distal lesion was then stented with a 3.5 x 15 mm MultiLink vision stent. The mid lesion was stented with a 3.5 x 15 mm MultiLink vision bare metal stent. The distal stent was deployed at 14 atmospheres, the mid stent was deployed at 16 atmospheres. Both stents were postdilated with a 3.75 x 12 mm noncompliant  balloon to 16 atmospheres. There was a good angiographic result with no residual stenosis at either stent site. The patient tolerated the procedure well. There were no immediate complications. At the completion of the procedure the Perclose device was used for femoral hemostasis. There was residual oozing from the  groin site and a FemoStop will probably have to be placed.  Procedural Findings:  Hemodynamics  PA 97/32 with a mean of 54  PCWP V wave 44, A wave 37, mean 33  LV 168/18  AO 143/65 with a mean of 94  Oxygen saturations:  PA 62  AO 97  Cardiac Output (Fick) 5.6  Cardiac Index (Fick) 2.4  Mitral valve hemodynamics: Mean gradient 11, mitral valve area 1.5  Aortic valve hemodynamics: Mean gradient 20, valve area 1.7  Lesion 1:  RCA/mid  Baseline 90% stenosis with heavy calcification  Baseline TIMI flow 3  Stent: 3.5 x 15 mm bare-metal  Post stent 0% stenosis  Post stent TIMI flow 3  Lesion 2:  RCA/distal  Baseline 90% stenosis with heavy calcification  Baseline TIMI flow 3  Stent: 3.5 x 15 mm bare-metal  Post stent 0% stenosis  Post stent TIMI flow 3  Final Conclusions:  1. Moderate residual aortic stenosis status post balloon aortic valvuloplasty  2. Moderate mitral stenosis  3. Severe pulmonary hypertension  4. Successful rotational atherectomy and stenting of the right coronary artery using bare-metal stents.  Recommendations: Will review the patient's clinical situation and hemodynamics began with Dr. Cornelius Moras. We will see him back in the multidisciplinary valve clinic. He will remain on aspirin and Plavix for a minimum of 30 days following PCI. His hemodynamics suggest moderate residual aortic stenosis. I think his aortic valve area is over calculated at 1.7 and I'm sure it is much tighter than this. The mitral valve appears to have moderate stenosis based on simultaneous pressure recordings and pulmonary capillary wedge and LV.  [name]  05/07/2012, 1:29  PM    CARDIAC-GATED CTA  OVER-READ INTERPRETATION - CT CHEST  The following report is an over-read performed by radiologist Dr.  Aubery Lapping. [place] Hard, M.D. of [facility], [place] on 06/04/2012  15:57:03. This over-read does not include interpretation of  cardiac or coronary anatomy or pathology. The CTA interpretation  by the cardiologist is attached.  Comparison: None  Findings: No filling defect in the visualized opacified pulmonary  arteries. Calcified granulomas noted within the lungs bilaterally.  Calcified right hilar lymph nodes. Findings compatible with old  granulomatous disease. Mildly prominent subcarinal lymph node  measuring 17 mm in short axis diameter. Other than the calcified  granulomas, no confluent opacity in the lungs. No effusions.  Degenerative changes in the thoracic spine. No acute bony  abnormality.  IMPRESSION:  Old granulomatous disease.  Mildly prominent subcarinal lymph nodes. These may be reactive.  This can be followed with repeat CT in 6 months.  Cardiac CTA:  Indication: TAVR  Protocol: The patient was scanned on a Philips 256 slice scanner.  No beta blocker or nitro were used given his severe AS. Average HR  during scan was 66 bpm. A retrospective 120 kV scan was triggered  in the descending thoracic aorta at 111 HU's. 80 cc of contrast  were used. The patient subsequently had an additional 80 cc of  contrast for abdominal and pelvic run-off CTA to assess suitability  for transfemoral approach. The 3D data set was analyzed on a Tera  Recon work station using MPR, MIP and VRT images  Findings:  Aortic Valve: Tri-leaflet and heavily calcified including base of  leaflets. Extensive calcification extending from the base of the  left coronary cusp through the intervalvular fibrosa to the mitral  annulus. The mitral annulus is severely calcified as well  Virtual Basal Annulus: Difficult to measure  due to extensive  calcification. 40% phase used   Max/Min Diameter: 41mm/21mm  Area: 525cm2 inclusive of calcium ( Note Dr Llana Aliment measures  512cm2 and 464cm2 excluding calcium)  Perimeter: 77mm  Aorta: No aneurysmal dilatation or dissection. Minor calcification  on the minor surface of the arch and left subclavian take-off  Normal arch vessel origins  Ascending Aorta: 3.5 cm  STJ: 2.7 cm  Sinus Measurements:  NCS: 34mm  RCS: 30 mm RCA height above valve plane 13 mm  LCS: 33 mm LM height above valve plane 15 mm  Coronary Arteries:  LM- extremely calcified with distal 80% stenosis  LAD- occluded in mid vessel with patent LIMA  Circumflex- occluded proximally  RCA: extremely calcified with patent stent in mid vessel  SVG RCA- occluded in mid vessel  SVG IM- patent  Lima LAD- patent  Impression:  1) Severely calcified trileaflet aortic valve. Unfavorable  characteristics for TAVR include heavy calcification at the annulus  extending from the left cusp into the intervalvular fibrosa and MAC  with known mitral stenosis  2) Annular measurements noted above suitable for a 26mm Sapien  Valve  3) No arch abnormalities that would affect transfemoral delivery  4) Best angiographic delivery angle LAO 24 degrees Cranial 11  degrees  5) Severe native CAD with occluded SVG to RCA and patent SVG to  OM and LIMA to LAD  Charlton Haws MD [facility]  Original Report Authenticated By: Charlett Nose, M.D.    Impression:  The patient is seen in the multidisciplinary heart valve clinic in conjunction with Dr. Excell Seltzer. We have her shortly reviewed the patient's recent cardiac gated CT angiogram of the heart and discussed findings at length with the patient and his wife in the office today. We have also forwarded copies of his scans to colleagues it other centers for review. The patient has quite extensive calcification throughout the base of the heart with severe calcification extending through the aortic valve leaflet, through the annulus and directly in  conjunction with the anterior leaflet of the mitral valve it is quite bulky and extensive. The patient has at least moderate if not severe mitral stenosis in addition to the severe aortic stenosis. Under the circumstances it seems clear that an attempt at transcatheter aortic valve replacement might be quite risky with concerned that the patient would be left with possible significant paravalvular leak and/or disruption of the calcium involving the mitral valve which might cause either worsening of the patient's mitral stenosis or the development of significant mitral regurgitation. As a result, we do not feel that transcatheter aortic valve replacement would be wise despite the fact that the patient seems to have enjoyed significant symptomatic improvement following recent balloon aortic valvuloplasty. Although the risks associated with conventional surgical treatment would be high, we feel that this would be the best choice for Mr. Gilkey.  At this point the patient is clinically quite stable, and it is certainly reasonable to follow him closely in the immediate future with continued medical therapy. However, his chances for a good recovery following high-risk surgery will diminish rapidly if his condition were to deteriorate to any significant degree.   Plan:  The patient and his wife will contemplate matters and continue to follow closely with Dr. Excell Seltzer in the immediate future. At this point he is not sure whether or not he would be interested in high-risk surgery, but he clearly understands the issues its take. All of his questions been addressed.   Salvatore Decent. Cornelius Moras,  MD 06/27/2012 3:23 PM

## 2012-06-27 NOTE — Progress Notes (Signed)
MULTIDISCIPLINARY HEART VALVE CLINIC NOTE  Patient ID: [name] [name]. MRN: [id number] DOB/AGE: [birth year] 70 y.o.  Primary Care Physician:Graham Para March, MD Primary Cardiologist: Juanito Doom, MD  HPI: 72-year-old gentleman returning for follow-up evaluation. He has rheumatic heart disease with mitral and aortic stenosis, severe CAD s/p CABG, and severe pulmonary hypertension. He underwent balloon aortic valvuloplasty February 24th and rotational atherectomy of the RCA with bare-metal stenting [name] 5th. He has undergone extensive evaluation for review of treatment options for his severe valvular heart disease. He presents today for further discussion of options.  From a symptomatic perspective, he is doing fairly well. He remains limited by exertional dyspnea. He denies leg swelling, orthopnea, PND, or chest pain. He denies palpitations, lightheadedness, or syncope.  Past Medical History  Diagnosis Date  . GERD (gastroesophageal reflux disease)   . Hyperlipidemia   . Hypertension   . Diabetes mellitus   . Carotid artery disease   . Cellulitis and abscess of leg, except foot   . Leg edema   . Right knee sprain   . Cramps, muscle, general   . Low back pain   . Hiatal hernia   . Chills   . Hearing loss   . Nasal congestion   . Cough   . Wheezing   . Generalized headaches   . CAD (coronary artery disease) 11/05/1996    a. CABG x6 by Dr Andrey Campanile - LIMA to Diag+LAD, SVG to OM1+OM2, SVG to PDA+RPL, b. VG->RCA known to be occluded;  c. 05/2012 PCI/Rota/BMS to m/dRCA w/ 3.5x15 Vision BMS x 2 post-dil to 3.75.  Marland Kitchen Mitral stenosis     a. moderate by echo 04/2012.  Marland Kitchen Chronic diastolic congestive heart failure     a. 04/2012 Echo: EF 55-60-%  . Aortic stenosis     a. 04/2012 s/p baloon valvuloplasty w/ reduction in gradient from 47 to 27.  . Pulmonary hypertension     a. 05/2012 RH cath: PA 97/32(54)  . Venous insufficiency (chronic) (peripheral)   . Sleep apnea     no cpap  sleep study >5 yrs     Past Surgical History  Procedure Laterality Date  . Ett cardiolite  12/31/2006    Mild-Mod distal inf/apical ischemia  . Open heart surgery    . Acute or chronic diastolic hf  8/6/ - 10/11/2008    RLE cellulitis - HOSP  . Doppler echocardiography  10/11/2008    Mild AS, Mild-Mod MS, Severe LVH  . Le arterial and venous US  10/11/2008    Art clear.  Venous Neg DVT  . Appendectomy  12/25/2010    Dr Andrey Campanile  . Umbilical hernia repair  12/25/2010    Dr Andrey Campanile  . Hernia repair  12/25/10    umbilical hernia repair   . Tee without cardioversion  03/20/2012    Procedure: TRANSESOPHAGEAL ECHOCARDIOGRAM (TEE);  Surgeon: Laurey Morale, MD;  Location: [facility] ENDOSCOPY;  Service: Cardiovascular;  Laterality: N/A;    Family History  Problem Relation Age of Onset  . Diabetes Other     History   Social History  . Marital Status: Married    Spouse Name: N/A    Number of Children: N/A  . Years of Education: N/A   Occupational History  . Not on file.   Social History Main Topics  . Smoking status: Never Smoker   . Smokeless tobacco: Never Used  . Alcohol Use: No  . Drug Use: No  . Sexually Active: Not on file  Other Topics Concern  . Not on file   Social History Narrative  . No narrative on file       Allergies  Allergen Reactions  . Adhesive (Tape) Rash  . Celecoxib Rash    ROS:  Positive for chronic leg ulcers, otherwise negative except as per HPI  BP 112/52  Pulse 68  Resp 20  Ht 5\' 6"  (1.676 m)  Wt 245 lb (111.131 kg)  BMI 39.56 kg/m2  SpO2 94%  PHYSICAL EXAM: Pt is alert and oriented, WD, WN, in no distress. Remaining exam deferred today as time was spend in discussion.  ASSESSMENT AND PLAN:  1. Severe symptomatic mixed valvular heart disease (aortic stenosis/mitral stenosis) 2. Extensive CAD s/p CABG and PCI with continued graft patency of the LIMA to LAD and SVG to OM and recent stenting of the native RCA 3. Severe pulmonary HTN  The patient was seen in  conjunction with Dr Cornelius Moras of cardiac in the Multidisciplinary Valve Clinic. We have carefully reviewed his clinical circumstances, imaging and cath data, and even sent his cardiac imaging studies to colleagues at outside institutions for opinions on optimal treatment. We have considered TAVR as a potential treatment for his aortic stenosis as he has gained significant improvement from aortic balloon valvuloplasty. However, he has extreme calcification of the mitral-aortic intermembranous fibrosa and this presents significant challenges for TAVR with potential complication of annular rupture, perivalvular insufficiency, and worsening of mitral valve disease. Because of this, we agree TAVR is not a good treatment option. We reviewed the pros and cons of continued medical therapy versus surgical replacement of his mitral and aortic valves. He understands that he would be a high-risk candidate for redo double valve surgery, but also understands the limitations of medical therapy for severe aortic and mitral stenosis. He and his wife asked very appropriate questions and are going to further discuss options. If they decide to move forward with surgery they will contact Dr Cornelius Moras, otherwise I will see them back in 2 months for follow-up.  Time spent on this evaluation greater than 45 minutes, with greater than 50% spent in face-to-face discussion.  [name] 06/28/2012 1:30 PM

## 2012-07-04 ENCOUNTER — Telehealth: Payer: Self-pay | Admitting: Cardiovascular Disease

## 2012-07-04 NOTE — Telephone Encounter (Signed)
New problem ° ° °Per pts wife returning your call °

## 2012-07-04 NOTE — Telephone Encounter (Signed)
I spoke with the pt earlier today to arrange a 2 month follow-up appointment with Dr Excell Seltzer.  The pt's wife just called the office and is confused about why an appointment has been scheduled with Dr Excell Seltzer.  At this time the pt would like to proceed with open repair of aortic valve.  I made the pt's wife aware that I will forward this message to Darius Bump RN and she will follow-up with the pt in regards to surgery.

## 2012-07-07 ENCOUNTER — Telehealth: Payer: Self-pay | Admitting: Thoracic Surgery (Cardiothoracic Vascular Surgery)

## 2012-07-07 NOTE — Telephone Encounter (Signed)
Called to speak with patient and spoke with his wife.  They desire to schedule a follow up appointment in 4 weeks to discuss surgery.  Tachina Spoonemore H 07/07/2012 9:58 AM

## 2012-07-09 ENCOUNTER — Encounter: Payer: Self-pay | Admitting: Family Medicine

## 2012-07-09 ENCOUNTER — Ambulatory Visit (INDEPENDENT_AMBULATORY_CARE_PROVIDER_SITE_OTHER): Payer: Medicare Other | Admitting: Family Medicine

## 2012-07-09 VITALS — BP 138/74 | HR 60 | Temp 98.1°F | Wt 250.5 lb

## 2012-07-09 DIAGNOSIS — L989 Disorder of the skin and subcutaneous tissue, unspecified: Secondary | ICD-10-CM

## 2012-07-09 DIAGNOSIS — R252 Cramp and spasm: Secondary | ICD-10-CM

## 2012-07-09 MED ORDER — METHOCARBAMOL 500 MG PO TABS: 500.0000 mg | ORAL_TABLET | Freq: Every evening | ORAL | Status: DC | PRN

## 2012-07-09 NOTE — Progress Notes (Signed)
He's considering his options from a cardiac standpoint, ie valve replacement.    Lesion on R arm.  Present for about 3 months.  occ will have some fluid, not pus, expressed.  No trauma.  No help with otc meds.   R thigh cramping at night, if laying on L side.  Doesn't happen if in recliner. No exertional cramping.  Asking about options.   Meds, vitals, and allergies reviewed.   ROS: See HPI.  Otherwise, noncontributory.  nad ncat Mmm rrr with murmur noted ctab abd soft Ext with 1-2+ edema R forearm with 1.5cm round flesh papule with central area were he can express serosanguinous fluid, not pus.

## 2012-07-09 NOTE — Assessment & Plan Note (Signed)
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

## 2012-07-09 NOTE — Patient Instructions (Addendum)
See Shirlee Limerick about your referral before you leave today. Keep the area clean and covered. Use a pillow between your knees at night.  If you continue to have cramps then try the robaxin.  Take care.

## 2012-07-09 NOTE — Assessment & Plan Note (Signed)
Positional, he'll use a pillow between the knees and then robaxin if needed qhs.  F/u prn.

## 2012-07-25 ENCOUNTER — Other Ambulatory Visit: Payer: Self-pay | Admitting: *Deleted

## 2012-07-25 MED ORDER — PANTOPRAZOLE SODIUM 40 MG PO TBEC: 40.0000 mg | DELAYED_RELEASE_TABLET | Freq: Every day | ORAL | Status: DC

## 2012-07-25 NOTE — Telephone Encounter (Signed)
Rx originally prescribed and refilled by [facility], pt requested pharmacy send refill request to you. Is it ok to fill?

## 2012-07-25 NOTE — Telephone Encounter (Signed)
Yes, continue.  Sent.

## 2012-08-04 ENCOUNTER — Ambulatory Visit: Payer: Medicare Other | Admitting: Thoracic Surgery (Cardiothoracic Vascular Surgery)

## 2012-08-05 ENCOUNTER — Ambulatory Visit (INDEPENDENT_AMBULATORY_CARE_PROVIDER_SITE_OTHER): Payer: Medicare Other | Admitting: Thoracic Surgery (Cardiothoracic Vascular Surgery)

## 2012-08-05 ENCOUNTER — Encounter: Payer: Self-pay | Admitting: Thoracic Surgery (Cardiothoracic Vascular Surgery)

## 2012-08-05 VITALS — BP 129/66 | HR 60 | Resp 16 | Ht 70.5 in | Wt 249.0 lb

## 2012-08-05 DIAGNOSIS — Z951 Presence of aortocoronary bypass graft: Secondary | ICD-10-CM

## 2012-08-05 DIAGNOSIS — I509 Heart failure, unspecified: Secondary | ICD-10-CM

## 2012-08-05 DIAGNOSIS — I2789 Other specified pulmonary heart diseases: Secondary | ICD-10-CM

## 2012-08-05 DIAGNOSIS — I272 Pulmonary hypertension, unspecified: Secondary | ICD-10-CM

## 2012-08-05 DIAGNOSIS — I359 Nonrheumatic aortic valve disorder, unspecified: Secondary | ICD-10-CM

## 2012-08-05 DIAGNOSIS — I050 Rheumatic mitral stenosis: Secondary | ICD-10-CM

## 2012-08-05 DIAGNOSIS — Z9889 Other specified postprocedural states: Secondary | ICD-10-CM

## 2012-08-05 DIAGNOSIS — I5032 Chronic diastolic (congestive) heart failure: Secondary | ICD-10-CM

## 2012-08-05 DIAGNOSIS — I2510 Atherosclerotic heart disease of native coronary artery without angina pectoris: Secondary | ICD-10-CM

## 2012-08-05 DIAGNOSIS — I350 Nonrheumatic aortic (valve) stenosis: Secondary | ICD-10-CM

## 2012-08-05 NOTE — Progress Notes (Signed)
[address]       [name] [address]             [phone]     CARDIOTHORACIC SURGERY OFFICE NOTE  Referring Provider is Daleen Squibb, Jesse Sans, MD Primary Cardiologist is [name], MD PCP is Crawford Givens, MD   HPI:  Patient returns for followup of severe symptomatic aortic stenosis, mitral stenosis, coronary artery disease, and chronic diastolic congestive heart failure. He was originally seen in consultation on 03/10/2012 and since then he underwent balloon aortic valvuloplasty by Dr. Excell Seltzer on 04/28/2012. His mean transvalvular gradient at catheterization decreased from 47 to 27 mm mercury. He did well following this and returned for repeat catheterization with PCI and stenting of the right coronary artery using a bare metal stent on 05/07/2012. At that time his aortic stenosis remained stable with mean transvalvular gradient measured 20 mm mercury at cath. Mean transvalvular gradient across the mitral valve was 11 mm mercury. Pulmonary artery pressures measured 97/32 with mean pulmonary capillary wedge pressure 33.  I last saw him in the multidisciplinary valve clinic with Dr. Excell Seltzer on 06/27/2012 at which time he was doing fairly well.  At that time we discussed treatment alternatives for more definitive management. In particular, after extensive review of the patient's cardiac gated CT angiogram of the heart we felt that it would not be wise to consider transcatheter aortic valve replacement as a potential long-term treatment option, but rather redo median sternotomy for aortic and mitral valve replacement likely be the best choice despite the relative high-risk. At that time the patient was feeling well and elected to continue medical therapy and take some time to discuss with his wife and family whether or not he might be agreeable with high-risk surgical intervention in the future. Since then the patient has remained fairly stable, although he admits that over the  last couple of weeks he has began to experience gradual worsening of symptoms of exertional shortness of breath. He states that he is still considerably better than he was at the time of his presentation 5 or 6 months ago, but he is now at least intermittently getting more short of breath with relatively mild activity. He denies resting shortness of breath. He has never had any chest pain or chest tightness. Is not having any dizzy spells nor syncope. He has severe chronic bilateral lower extremity edema and he states that he has not had any increasing problems with fluid retention or swelling over the last month. He does admit that he seems to be slowly getting worse, and although he still has some issues that he wants to take care of at home at the present time he feels that he does wish to proceed with surgery in the near future, perhaps in early July.  His review of systems is otherwise unchanged from previously although he did have a skin lesion removed from his right forearm earlier today at his dermatologist felt potentially to be a skin cancer.    Current Outpatient Prescriptions  Medication Sig Dispense Refill  . aspirin 81 MG tablet Take 81 mg by mouth daily.        . clopidogrel (PLAVIX) 75 MG tablet Take 1 tablet (75 mg total) by mouth daily.  30 tablet  5  . co-enzyme Q-10 30 MG capsule Take 30 mg by mouth daily.       Marland Kitchen ezetimibe (ZETIA) 10 MG tablet Take 1 tablet (10 mg total) by mouth  daily.  30 tablet  5  . fenofibrate micronized (ANTARA) 130 MG capsule Take 1 capsule (130 mg total) by mouth daily before breakfast.  30 capsule  5  . fish oil-omega-3 fatty acids 1000 MG capsule Take 1 g by mouth 2 (two) times daily.       . furosemide (LASIX) 80 MG tablet Take 1 tablet (80 mg total) by mouth daily.  30 tablet  5  . insulin glargine (LANTUS) 100 UNIT/ML injection Inject 80 Units into the skin 3 (three) times daily.       Marland Kitchen JANUVIA 100 MG tablet Take 100 mg by mouth daily.       .  metFORMIN (GLUCOPHAGE) 1000 MG tablet Take 1 tablet (1,000 mg total) by mouth 2 (two) times daily with a meal. Resume tomorrow 3/8.      . methocarbamol (ROBAXIN) 500 MG tablet Take 1 tablet (500 mg total) by mouth at bedtime as needed (for cramps).  30 tablet  3  . metoprolol (LOPRESSOR) 25 MG tablet Take 1 tablet (25 mg total) by mouth 2 (two) times daily.  60 tablet  5  . nitroGLYCERIN (NITROSTAT) 0.4 MG SL tablet Place 1 tablet (0.4 mg total) under the tongue every 5 (five) minutes as needed for chest pain. May repeat up to 3 times as needed  30 tablet  5  . pantoprazole (PROTONIX) 40 MG tablet Take 1 tablet (40 mg total) by mouth daily.  90 tablet  1  . potassium chloride 20 MEQ TBCR Take 20 mEq by mouth daily.  30 tablet  6  . ramipril (ALTACE) 10 MG tablet Take 1 tablet (10 mg total) by mouth daily.  30 tablet  5  . ranitidine (ZANTAC) 150 MG tablet 150 mg 2 (two) times daily.       . repaglinide (PRANDIN) 2 MG tablet Take 2 mg by mouth 3 (three) times daily before meals.        . rosuvastatin (CRESTOR) 20 MG tablet Take 1 tablet (20 mg total) by mouth at bedtime.  30 tablet  6  . sertraline (ZOLOFT) 50 MG tablet Take 1 tablet (50 mg total) by mouth daily.  90 tablet  0  . Vitamin D, Ergocalciferol, (DRISDOL) 50000 UNITS CAPS Take 50,000 Units by mouth every 7 (seven) days.        Marland Kitchen LOVAZA 1 G capsule Take 1 g by mouth 2 (two) times daily.       . [DISCONTINUED] promethazine (PHENERGAN) 25 MG tablet Take 1/2 to 1 tablet up to every 6 hours as needed for nausea.        No current facility-administered medications for this visit.      Physical Exam:   BP 129/66  Pulse 60  Resp 16  Ht 5' 10.5" (1.791 m)  Wt 249 lb (112.946 kg)  BMI 35.21 kg/m2  SpO2 97%  General:  Obese and chronically ill-appearing but overall pleasant and in no distress. The patient does get mildly dyspneic with talking.  Chest:   Fairly clear with scattered bibasilar crackles  CV:   Regular rate and rhythm with  systolic murmur heard best along the left sternal border  Incisions:  n/a  Abdomen:  Obese, soft, nontender  Extremities:  Warm and adequately perfused with severe chronic lower extremity edema  Diagnostic Tests:  n/a   Impression:  The patient has complex long-standing rheumatic heart disease with severe aortic stenosis and moderate mitral stenosis with severe three-vessel coronary artery disease status  post coronary artery bypass grafting in the distant past. The patient has long-standing history of chronic diastolic congestive heart failure and severe pulmonary hypertention that further made worse by the presence of obstructive sleep apnea and obesity.  The patient has enjoyed considerable improvement in symptoms of chronic diastolic congestive heart failure following balloon aortic valvuloplasty and PCI with stenting of the native right coronary artery using per metal stent. However, symptoms of exertional shortness of breath are again slowly getting worse.  At present the patient remains clinically stable, but he is now in favor of the idea of proceeding with high-risk aortic and mitral valve replacement in the near future before his condition deteriorates any further.    Plan:  The patient will return to see Dr. Excell Seltzer for followup as previously scheduled on 08/29/2012. We'll make sure that a followup echocardiogram was performed at that time. The patient will then return to see me on 09/01/2012, and depending upon how he is doing we will make definitive schedule for surgery sometime in early July. The patient will call and contact us during the interim period of time if his clinical condition changes in any significant way. All of his questions been addressed.   Salvatore Decent. Cornelius Moras, MD 08/05/2012 2:57 PM

## 2012-08-06 ENCOUNTER — Telehealth: Payer: Self-pay | Admitting: Cardiovascular Disease

## 2012-08-06 ENCOUNTER — Other Ambulatory Visit: Payer: Self-pay

## 2012-08-06 DIAGNOSIS — I359 Nonrheumatic aortic valve disorder, unspecified: Secondary | ICD-10-CM

## 2012-08-06 NOTE — Telephone Encounter (Signed)
Follow UP  Pt is returning your call.

## 2012-08-07 ENCOUNTER — Ambulatory Visit: Payer: Medicare Other | Admitting: Thoracic Surgery (Cardiothoracic Vascular Surgery)

## 2012-08-15 ENCOUNTER — Encounter: Payer: Self-pay | Admitting: Family Medicine

## 2012-08-15 DIAGNOSIS — C4492 Squamous cell carcinoma of skin, unspecified: Secondary | ICD-10-CM | POA: Insufficient documentation

## 2012-08-20 ENCOUNTER — Encounter: Payer: Self-pay | Admitting: Family Medicine

## 2012-08-29 ENCOUNTER — Ambulatory Visit (INDEPENDENT_AMBULATORY_CARE_PROVIDER_SITE_OTHER): Payer: Medicare Other | Admitting: Cardiovascular Disease

## 2012-08-29 ENCOUNTER — Encounter: Payer: Self-pay | Admitting: Cardiovascular Disease

## 2012-08-29 ENCOUNTER — Other Ambulatory Visit (HOSPITAL_COMMUNITY): Payer: Medicare Other

## 2012-08-29 ENCOUNTER — Ambulatory Visit (HOSPITAL_COMMUNITY): Payer: Medicare Other | Attending: Internal Medicine | Admitting: Radiology

## 2012-08-29 VITALS — BP 120/72 | HR 78 | Ht 70.0 in | Wt 247.8 lb

## 2012-08-29 DIAGNOSIS — I359 Nonrheumatic aortic valve disorder, unspecified: Secondary | ICD-10-CM

## 2012-08-29 DIAGNOSIS — R0602 Shortness of breath: Secondary | ICD-10-CM

## 2012-08-29 DIAGNOSIS — I080 Rheumatic disorders of both mitral and aortic valves: Secondary | ICD-10-CM | POA: Insufficient documentation

## 2012-08-29 DIAGNOSIS — I509 Heart failure, unspecified: Secondary | ICD-10-CM | POA: Insufficient documentation

## 2012-08-29 MED ORDER — FUROSEMIDE 80 MG PO TABS: 80.0000 mg | ORAL_TABLET | Freq: Two times a day (BID) | ORAL | Status: DC

## 2012-08-29 MED ORDER — POTASSIUM CHLORIDE ER 20 MEQ PO TBCR: 20.0000 meq | EXTENDED_RELEASE_TABLET | Freq: Two times a day (BID) | ORAL | Status: DC

## 2012-08-29 NOTE — Progress Notes (Signed)
Echocardiogram performed.  

## 2012-08-29 NOTE — Patient Instructions (Addendum)
Your physician has recommended you make the following change in your medication: INCREASE Furosemide 80mg  take one by mouth twice a day, INCREASE Potassium Chloride take one by mouth twice a day   Your physician recommends that you return for lab work in: 1 WEEK (BMP), lab hours 7:30-5:45

## 2012-09-01 ENCOUNTER — Ambulatory Visit (INDEPENDENT_AMBULATORY_CARE_PROVIDER_SITE_OTHER): Payer: Medicare Other | Admitting: Thoracic Surgery (Cardiothoracic Vascular Surgery)

## 2012-09-01 ENCOUNTER — Other Ambulatory Visit: Payer: Medicare Other

## 2012-09-01 ENCOUNTER — Other Ambulatory Visit: Payer: Self-pay | Admitting: *Deleted

## 2012-09-01 ENCOUNTER — Encounter: Payer: Self-pay | Admitting: Thoracic Surgery (Cardiothoracic Vascular Surgery)

## 2012-09-01 VITALS — BP 109/59 | HR 60 | Resp 20 | Ht 70.0 in | Wt 247.0 lb

## 2012-09-01 DIAGNOSIS — I359 Nonrheumatic aortic valve disorder, unspecified: Secondary | ICD-10-CM

## 2012-09-01 DIAGNOSIS — I350 Nonrheumatic aortic (valve) stenosis: Secondary | ICD-10-CM

## 2012-09-01 DIAGNOSIS — I080 Rheumatic disorders of both mitral and aortic valves: Secondary | ICD-10-CM

## 2012-09-01 MED ORDER — SERTRALINE HCL 50 MG PO TABS: 50.0000 mg | ORAL_TABLET | Freq: Every day | ORAL | Status: DC

## 2012-09-01 NOTE — Progress Notes (Signed)
[address]       [name] [address]             [phone]     CARDIOTHORACIC SURGERY OFFICE NOTE  Referring Provider is [name], MD PCP is Crawford Givens, MD   HPI:  Patient returns for followup of severe symptomatic aortic stenosis, mitral stenosis, coronary artery disease, and chronic diastolic congestive heart failure. He was last seen here in the office 08/05/2012. He underwent followup echocardiogram last week and saw Dr. Excell Seltzer in the office. He returns to the office today to further discuss the possibility of proceeding with redo median sternotomy for aortic and mitral valve replacement.  The patient reports that he continues to have exertional shortness of breath. He states that these symptoms have been stable over the past month. He has chronic bilateral lower extremity edema. He reports that he has some good days and some bad days but overall he remains fairly stable.   Current Outpatient Prescriptions  Medication Sig Dispense Refill  . aspirin 81 MG tablet Take 81 mg by mouth daily.        . clopidogrel (PLAVIX) 75 MG tablet Take 1 tablet (75 mg total) by mouth daily.  30 tablet  5  . co-enzyme Q-10 30 MG capsule Take 30 mg by mouth daily.       Marland Kitchen ezetimibe (ZETIA) 10 MG tablet Take 1 tablet (10 mg total) by mouth daily.  30 tablet  5  . fenofibrate micronized (ANTARA) 130 MG capsule Take 1 capsule (130 mg total) by mouth daily before breakfast.  30 capsule  5  . fish oil-omega-3 fatty acids 1000 MG capsule Take 1 g by mouth 2 (two) times daily.       . furosemide (LASIX) 80 MG tablet Take 1 tablet (80 mg total) by mouth 2 (two) times daily.  60 tablet  5  . insulin glargine (LANTUS) 100 UNIT/ML injection Inject 80 Units into the skin 3 (three) times daily.       Marland Kitchen JANUVIA 100 MG tablet Take 100 mg by mouth daily.       Marland Kitchen LOVAZA 1 G capsule Take 1 g by mouth 2 (two) times daily.       . metFORMIN (GLUCOPHAGE) 1000 MG tablet Take 1 tablet  (1,000 mg total) by mouth 2 (two) times daily with a meal. Resume tomorrow 3/8.      . methocarbamol (ROBAXIN) 500 MG tablet Take 1 tablet (500 mg total) by mouth at bedtime as needed (for cramps).  30 tablet  3  . metoprolol (LOPRESSOR) 25 MG tablet Take 1 tablet (25 mg total) by mouth 2 (two) times daily.  60 tablet  5  . nitroGLYCERIN (NITROSTAT) 0.4 MG SL tablet Place 1 tablet (0.4 mg total) under the tongue every 5 (five) minutes as needed for chest pain. May repeat up to 3 times as needed  30 tablet  5  . pantoprazole (PROTONIX) 40 MG tablet Take 1 tablet (40 mg total) by mouth daily.  90 tablet  1  . Potassium Chloride ER 20 MEQ TBCR Take 20 mEq by mouth 2 (two) times daily.  60 tablet  5  . ramipril (ALTACE) 10 MG tablet Take 1 tablet (10 mg total) by mouth daily.  30 tablet  5  . ranitidine (ZANTAC) 150 MG tablet 150 mg 2 (two) times daily.       . repaglinide (PRANDIN) 2 MG tablet Take 2 mg by  mouth 3 (three) times daily before meals.        . rosuvastatin (CRESTOR) 20 MG tablet Take 1 tablet (20 mg total) by mouth at bedtime.  30 tablet  6  . Vitamin D, Ergocalciferol, (DRISDOL) 50000 UNITS CAPS Take 50,000 Units by mouth every 7 (seven) days.        Marland Kitchen sertraline (ZOLOFT) 50 MG tablet Take 1 tablet (50 mg total) by mouth daily.  90 tablet  0  . [DISCONTINUED] promethazine (PHENERGAN) 25 MG tablet Take 1/2 to 1 tablet up to every 6 hours as needed for nausea.        No current facility-administered medications for this visit.      Physical Exam:   BP 109/59  Pulse 60  Resp 20  Ht 5\' 10"  (1.778 m)  Wt 247 lb (112.038 kg)  BMI 35.44 kg/m2  SpO2 95%  General:  Well-appearing  Chest:   Clear to auscultation  CV:   Regular rate and rhythm with systolic murmur  Incisions:  n/a  Abdomen:  Soft and nontender  Extremities:  Moderate bilateral lower extremity edema  Diagnostic Tests:  Transthoracic Echocardiography  Patient: [name] [name], Cremer MR #: [id number] Study Date:  08/29/2012 Gender: M Age: 76 Height: 177.8cm Weight: 112.9kg BSA: 2.63m^2 Pt. Status: Room:  SONOGRAPHER Luvenia Redden, RDCS ATTENDING Dietrich Pates Duanne Moron, Randa Evens PERFORMING [facility], [facility] cc:  ------------------------------------------------------------ LV EF: 65% - 70%  ------------------------------------------------------------ Indications: Shortness of breath 786.05.  ------------------------------------------------------------ History: PMH: Acquired from the patient and from the patient's chart. Coronary artery disease. Congestive heart failure. Aortic valve disease. Mitral valve disease. Primary pulmonary hypertension.  ------------------------------------------------------------ Study Conclusions  - Left ventricle: The cavity size was mildly reduced. Wall thickness was increased in a pattern of severe LVH. Systolic function was vigorous. The estimated ejection fraction was in the range of 65% to 70%. Features are consistent with a pseudonormal left ventricular filling pattern, with concomitant abnormal relaxation and increased filling pressure (grade 2 diastolic dysfunction). - Aortic valve: AV is thickened, calcified with restricted motion Peak and mean gradients through the valve are 12 and 7 mm Hg respectively consistent with very mild AS. 2D images suggest AS is in mild to moderate range. Compared to echo from earlier this year, doppler velocities across aortic valve are much lower. - Mitral valve: MV Is thickened with restricted motion. Peak and mean gradients through the valve are 32 and 12 mm Hg respectively. MVA by pressure T1/2 is 1.42 cm2. Calcified annulus. Moderately thickened leaflets . Valve area by pressure half-time: 1.42cm^2. - Right ventricle: There was mild hypertrophy. Systolic function was mildly to moderately reduced.  ------------------------------------------------------------ Labs, prior  tests, procedures, and surgery: S/P valvuloplasty 2/14. Coronary artery bypass grafting. Transthoracic echocardiography. M-mode, complete 2D, spectral Doppler, and color Doppler. Height: Height: 177.8cm. Height: 70in. Weight: Weight: 112.9kg. Weight: 248.5lb. Body mass index: BMI: 35.7kg/m^2. Body surface area: BSA: 2.73m^2. Blood pressure: 129/66. Patient status: Outpatient. Location: [facility]  ------------------------------------------------------------  ------------------------------------------------------------ Left ventricle: The cavity size was mildly reduced. Wall thickness was increased in a pattern of severe LVH. Systolic function was vigorous. The estimated ejection fraction was in the range of 65% to 70%. Features are consistent with a pseudonormal left ventricular filling pattern, with concomitant abnormal relaxation and increased filling pressure (grade 2 diastolic dysfunction).  ------------------------------------------------------------ Aortic valve: AV is thickened, calcified with restricted motion Peak and mean gradients through the valve are 12 and 7 mm Hg respectively consistent  with very mild AS. 2D images suggest AS is in mild to moderate range. Compared to echo from earlier this year, doppler velocities across aortic valve are much lower. Doppler: No significant regurgitation. VTI ratio of LVOT to aortic valve: 0.48. Peak velocity ratio of LVOT to aortic valve: 0.44. Mean gradient: 31mm Hg (S). Peak gradient: 46mm Hg (S).  ------------------------------------------------------------ Mitral valve: MV Is thickened with restricted motion. Peak and mean gradients through the valve are 32 and 12 mm Hg respectively. MVA by pressure T1/2 is 1.42 cm2. MV is thickened, calcified. Difficult to see well. Moderate annular calcification. Peak and mean gradients through the valve are 28 and 11 mm respectively Calcified annulus. Moderately thickened leaflets .  Doppler: Valve area by pressure half-time: 1.42cm^2. Indexed valve area by pressure half-time: 0.62cm^2/m^2. Mean gradient: 11mm Hg (D). Peak gradient: 28mm Hg (D).  ------------------------------------------------------------ Left atrium: The atrium was mildly dilated.  ------------------------------------------------------------ Right ventricle: The cavity size was normal. There was mild hypertrophy. Systolic function was mildly to moderately reduced.  ------------------------------------------------------------ Pulmonic valve: Poorly visualized. Doppler: No significant regurgitation.  ------------------------------------------------------------ Tricuspid valve: Mildly thickened leaflets. Doppler: Trivial regurgitation.  ------------------------------------------------------------ Right atrium: The atrium was normal in size.  ------------------------------------------------------------ Pericardium: There was no pericardial effusion.  ------------------------------------------------------------ Systemic veins: Inferior vena cava: The vessel was dilated; the respirophasic diameter changes were blunted (< 50%); findings are consistent with elevated central venous pressure.  ------------------------------------------------------------  2D measurements Normal Doppler measurements Norma Left ventricle l LVID ED, 48.6 mm 43-52 Left ventricle chord, Ea, lat 4.28 cm/s ----- PLAX ann, tiss LVID ES, 31.8 mm 23-38 DP chord, E/Ea, lat 62.16 ----- PLAX ann, tiss FS, 35 % >29 DP chord, Ea, med 2.63 cm/s ----- PLAX ann, tiss LVPW, ED 14.69 mm ------ DP IVS/LVPW 1.1 <1.3 E/Ea, med 101.16 ----- ratio, ED ann, tiss Ventricular septum DP IVS, ED 16.23 mm ------ LVOT Aorta Peak vel, 149 cm/s ----- Root 34 mm ------ S diam, ED VTI, S 37.4 cm ----- Left atrium Peak 9 mm Hg ----- AP dim 43 mm ------ gradient, AP dim 1.88 cm/m^2 <2.2 S index Aortic valve Peak vel, 338 cm/s  ----- S Mean vel, 265 cm/s ----- S VTI, S 78.2 cm ----- Mean 31 mm Hg ----- gradient, S Peak 46 mm Hg ----- gradient, S VTI ratio 0.48 ----- LVOT/AV Peak vel 0.44 ----- ratio, LVOT/AV Mitral valve Peak E 266.05 cm/s ----- vel Peak A 179 cm/s ----- vel Mean vel, 153 cm/s ----- D Decelerat 510.3 cm/s^2 ----- ion slope Decelerat 533 ms 150-2 ion time 30 Pressure 155 ms ----- half-time Mean 11 mm Hg ----- gradient, D Peak 28 mm Hg ----- gradient, D Peak E/A 1.49 ----- ratio Area 1.42 cm^2 ----- (PHT) Area 0.62 cm^2/m ----- index ^2 (PHT) Annulus 102 cm ----- VTI Systemic veins Estimated 5 mm Hg ----- CVP Right ventricle Sa vel, 6.03 cm/s ----- lat ann, tiss DP  ------------------------------------------------------------ Prepared and Electronically Authenticated by  Dietrich Pates [id number]:[id number]    Impression:  The patient has complex long-standing rheumatic heart disease with severe aortic stenosis and moderate mitral stenosis with severe three-vessel coronary artery disease status post coronary artery bypass grafting in the distant past. The patient has long-standing history of chronic diastolic congestive heart failure and severe pulmonary hypertention that further made worse by the presence of obstructive sleep apnea and obesity. The patient has enjoyed considerable improvement in symptoms of chronic diastolic congestive heart failure following balloon aortic valvuloplasty and PCI with stenting of the native right  coronary artery using per metal stent.  Follow up echocardiogram performed last week looks remarkably good with no significant interval increase in the transvalvular gradient across the aortic valve since balloon valvuloplasty was performed. Left ventricular systolic function remains normal. There is severe left ventricular hypertrophy.  The patient remains clinically stable. Options include continued close observation versus proceeding  with high-risk aortic and mitral valve replacement.    Plan:  The patient seems inclined to proceed with surgery before he deteriorates, but given the findings of this echocardiogram last week he is encouraged by the possibility of pulling off a bit longer. We will see him back in 6 weeks to see how he is getting along. He understands that it would be wise to proceed with surgery before his clinical condition deteriorate substantially.  However, given findings of his recent echocardiogram I think it is reasonable to continue to hold off for now.   Salvatore Decent. Cornelius Moras, MD 09/01/2012 5:36 PM

## 2012-09-04 ENCOUNTER — Other Ambulatory Visit: Payer: Self-pay | Admitting: Family Medicine

## 2012-09-04 ENCOUNTER — Other Ambulatory Visit (INDEPENDENT_AMBULATORY_CARE_PROVIDER_SITE_OTHER): Payer: Medicare Other

## 2012-09-04 ENCOUNTER — Encounter: Payer: Self-pay | Admitting: Cardiovascular Disease

## 2012-09-04 DIAGNOSIS — I359 Nonrheumatic aortic valve disorder, unspecified: Secondary | ICD-10-CM

## 2012-09-04 LAB — BASIC METABOLIC PANEL
BUN: 25 mg/dL — ABNORMAL HIGH (ref 6–23)
CO2: 25 mEq/L (ref 19–32)
Calcium: 9.5 mg/dL (ref 8.4–10.5)
GFR: 55.53 mL/min — ABNORMAL LOW (ref >60.00)
Glucose, Bld: 224 mg/dL — ABNORMAL HIGH (ref 70–99)
Sodium: 137 mEq/L (ref 135–145)

## 2012-09-04 NOTE — Progress Notes (Signed)
HPI:   72 year old gentleman presenting for followup evaluation. 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 He underwent balloon aortic valvuloplasty and right coronary artery stenting earlier this year. He has had modest symptomatic improvement. He's been evaluated extensively in our multidisciplinary valve clinic and has been considered for TAVR as well as open surgical double valve replacement (mitral/aortic). He has been deemed a poor candidate for TAVR because of complex valvular disease including mitral stenosis with heavy calcification in the aortic/mitral continuity.  The patient continues to have shortness of breath with activity. He has good days and bad days. Energy level is fair. He denies chest pain or pressure. Leg swelling is a little worse. No orthopnea or PND. No lightheadedness or syncope.  Outpatient Encounter Prescriptions as of 08/29/2012  Medication Sig Dispense Refill  . aspirin 81 MG tablet Take 81 mg by mouth daily.        . clopidogrel (PLAVIX) 75 MG tablet Take 1 tablet (75 mg total) by mouth daily.  30 tablet  5  . co-enzyme Q-10 30 MG capsule Take 30 mg by mouth daily.       Marland Kitchen ezetimibe (ZETIA) 10 MG tablet Take 1 tablet (10 mg total) by mouth daily.  30 tablet  5  . fenofibrate micronized (ANTARA) 130 MG capsule Take 1 capsule (130 mg total) by mouth daily before breakfast.  30 capsule  5  . fish oil-omega-3 fatty acids 1000 MG capsule Take 1 g by mouth 2 (two) times daily.       . furosemide (LASIX) 80 MG tablet Take 1 tablet (80 mg total) by mouth 2 (two) times daily.  60 tablet  5  . insulin glargine (LANTUS) 100 UNIT/ML injection Inject 80 Units into the skin 3 (three) times daily.       Marland Kitchen JANUVIA 100 MG tablet Take 100 mg by mouth daily.       Marland Kitchen LOVAZA 1 G capsule Take 1 g by mouth 2 (two) times daily.       . metFORMIN (GLUCOPHAGE)  1000 MG tablet Take 1 tablet (1,000 mg total) by mouth 2 (two) times daily with a meal. Resume tomorrow 3/8.      . methocarbamol (ROBAXIN) 500 MG tablet Take 1 tablet (500 mg total) by mouth at bedtime as needed (for cramps).  30 tablet  3  . metoprolol (LOPRESSOR) 25 MG tablet Take 1 tablet (25 mg total) by mouth 2 (two) times daily.  60 tablet  5  . nitroGLYCERIN (NITROSTAT) 0.4 MG SL tablet Place 1 tablet (0.4 mg total) under the tongue every 5 (five) minutes as needed for chest pain. May repeat up to 3 times as needed  30 tablet  5  . pantoprazole (PROTONIX) 40 MG tablet Take 1 tablet (40 mg total) by mouth daily.  90 tablet  1  . Potassium Chloride ER 20 MEQ TBCR Take 20 mEq by mouth 2 (two) times daily.  60 tablet  5  . ramipril (ALTACE) 10 MG tablet Take 1 tablet (10 mg total) by mouth daily.  30 tablet  5  . ranitidine (ZANTAC) 150 MG tablet 150 mg 2 (two) times daily.       . repaglinide (PRANDIN) 2 MG tablet Take 2 mg by mouth 3 (three) times daily before meals.        . rosuvastatin (CRESTOR) 20 MG tablet Take 1 tablet (20  mg total) by mouth at bedtime.  30 tablet  6  . Vitamin D, Ergocalciferol, (DRISDOL) 50000 UNITS CAPS Take 50,000 Units by mouth every 7 (seven) days.        . [DISCONTINUED] furosemide (LASIX) 80 MG tablet Take 1 tablet (80 mg total) by mouth daily.  30 tablet  5  . [DISCONTINUED] potassium chloride 20 MEQ TBCR Take 20 mEq by mouth daily.  30 tablet  6  . [DISCONTINUED] sertraline (ZOLOFT) 50 MG tablet Take 1 tablet (50 mg total) by mouth daily.  90 tablet  0   No facility-administered encounter medications on file as of 08/29/2012.    Allergies  Allergen Reactions  . Adhesive (Tape) Rash  . Celecoxib Rash    Past Medical History  Diagnosis Date  . GERD (gastroesophageal reflux disease)   . Hyperlipidemia   . Hypertension   . Diabetes mellitus   . Carotid artery disease   . Cellulitis and abscess of leg, except foot   . Leg edema   . Right knee sprain     . Cramps, muscle, general   . Low back pain   . Hiatal hernia   . Chills   . Hearing loss   . Nasal congestion   . Cough   . Wheezing   . Generalized headaches   . CAD (coronary artery disease) 11/05/1996    a. CABG x6 by Dr Andrey Campanile - LIMA to Diag+LAD, SVG to OM1+OM2, SVG to PDA+RPL, b. VG->RCA known to be occluded;  c. 05/2012 PCI/Rota/BMS to m/dRCA w/ 3.5x15 Vision BMS x 2 post-dil to 3.75.  Marland Kitchen Mitral stenosis     a. moderate by echo 04/2012.  Marland Kitchen Chronic diastolic congestive heart failure     a. 04/2012 Echo: EF 55-60-%  . Aortic stenosis     a. 04/2012 s/p baloon valvuloplasty w/ reduction in gradient from 47 to 27.  . Pulmonary hypertension     a. 05/2012 RH cath: PA 97/32(54)  . Venous insufficiency (chronic) (peripheral)   . Sleep apnea     no cpap  sleep study >5 yrs    ROS: Negative except as per HPI  BP 120/72  Pulse 78  Ht 5\' 10"  (1.778 m)  Wt 247 lb 12.8 oz (112.401 kg)  BMI 35.56 kg/m2  PHYSICAL EXAM: Pt is alert and oriented, NAD HEENT: normal Neck: JVP -mildly elevated Lungs: CTA bilaterally CV: RRR with grade 3/6 harsh systolic murmur at the right upper sternal border Abd: soft, NT, Positive BS, no hepatomegaly Ext: Mild diffuse edema, distal pulses intact and equal  2-D echo: Study Conclusions  - Left ventricle: The cavity size was mildly reduced. Wall thickness was increased in a pattern of severe LVH. Systolic function was vigorous. The estimated ejection fraction was in the range of 65% to 70%. Features are consistent with a pseudonormal left ventricular filling pattern, with concomitant abnormal relaxation and increased filling pressure (grade 2 diastolic dysfunction). - Aortic valve: AV is thickened, calcified with restricted motion Peak and mean gradients through the valve are 12 and 7 mm Hg respectively consistent with very mild AS. 2D images suggest AS is in mild to moderate range. Compared to echo from earlier this year, doppler velocities  across aortic valve are much lower. - Mitral valve: MV Is thickened with restricted motion. Peak and mean gradients through the valve are 32 and 12 mm Hg respectively. MVA by pressure T1/2 is 1.42 cm2. Calcified annulus. Moderately thickened leaflets . Valve area by pressure half-time:  1.42cm^2. - Right ventricle: There was mild hypertrophy. Systolic function was mildly to moderately reduced.  ASSESSMENT AND PLAN: 1. Acute on chronic diastolic heart failure. This is a result of severe LVH, rheumatic heart disease with aortic and mitral stenosis, extensive coronary disease, and hypertension. The patient has some increase in dyspnea and evidence of mild volume overload on exam. I have recommended that we increase his furosemide to 80 mg twice daily. Will also increase potassium supplementation. Repeat metabolic panel next week. Will followup in 6-8 weeks.  2. Valvular heart disease with aortic and mitral stenosis. Echocardiogram images and report reviewed. He has had a durable result from balloon aortic valvuloplasty. I have reviewed Dr. Orvan July recent evaluation and the patient will continue with close clinical followup with consideration for aortic and mitral valve replacement down the road depending on his clinical course.  3. Coronary artery disease status post CABG. The patient has extensive distal vessel disease and underwent rotational atherectomy and bare-metal stenting of the right coronary artery earlier this year. No anginal symptoms at present.  4. Hypertension. Blood pressure well controlled on current medical program.  [name] 09/04/2012 12:16 AM

## 2012-09-10 ENCOUNTER — Telehealth: Payer: Self-pay | Admitting: Cardiovascular Disease

## 2012-09-10 ENCOUNTER — Encounter: Payer: Self-pay | Admitting: Cardiovascular Disease

## 2012-09-10 NOTE — Telephone Encounter (Signed)
Wife aware of results and an appt was scheduled in September with Dr Excell Seltzer as instructed

## 2012-09-10 NOTE — Telephone Encounter (Signed)
New Problem:    Patient's wife called in returning Lauren's call regarding her husband's lab results.  Please call back.

## 2012-09-11 ENCOUNTER — Other Ambulatory Visit: Payer: Self-pay

## 2012-10-08 ENCOUNTER — Other Ambulatory Visit: Payer: Self-pay | Admitting: *Deleted

## 2012-10-08 MED ORDER — RAMIPRIL 10 MG PO TABS: 10.0000 mg | ORAL_TABLET | Freq: Every day | ORAL | Status: DC

## 2012-10-13 ENCOUNTER — Encounter: Payer: Self-pay | Admitting: Thoracic Surgery (Cardiothoracic Vascular Surgery)

## 2012-10-13 ENCOUNTER — Ambulatory Visit (INDEPENDENT_AMBULATORY_CARE_PROVIDER_SITE_OTHER): Payer: Medicare Other | Admitting: Thoracic Surgery (Cardiothoracic Vascular Surgery)

## 2012-10-13 VITALS — BP 114/63 | HR 66 | Resp 20 | Ht 70.0 in | Wt 247.0 lb

## 2012-10-13 DIAGNOSIS — I050 Rheumatic mitral stenosis: Secondary | ICD-10-CM

## 2012-10-13 DIAGNOSIS — I5032 Chronic diastolic (congestive) heart failure: Secondary | ICD-10-CM

## 2012-10-13 DIAGNOSIS — I350 Nonrheumatic aortic (valve) stenosis: Secondary | ICD-10-CM | POA: Insufficient documentation

## 2012-10-13 DIAGNOSIS — I080 Rheumatic disorders of both mitral and aortic valves: Secondary | ICD-10-CM

## 2012-10-13 DIAGNOSIS — I359 Nonrheumatic aortic valve disorder, unspecified: Secondary | ICD-10-CM

## 2012-10-13 DIAGNOSIS — I2510 Atherosclerotic heart disease of native coronary artery without angina pectoris: Secondary | ICD-10-CM

## 2012-10-13 DIAGNOSIS — I509 Heart failure, unspecified: Secondary | ICD-10-CM

## 2012-10-13 NOTE — Progress Notes (Signed)
[address]       [name] [address]             [phone]     CARDIOTHORACIC SURGERY OFFICE NOTE  Referring Provider is [name], MD PCP is Crawford Givens, MD   HPI:  Mr Spitler returns for followup of rheumatic aortic stenosis and mitral stenosis, coronary artery disease with previous CABG, and chronic diastolic congestive heart failure with severe pulmonary hypertension.  He was originally seen in consultation on 03/10/2012 and since then he underwent balloon aortic valvuloplasty by Dr. Excell Seltzer on 04/28/2012. His mean transvalvular gradient at catheterization decreased from 47 to 27 mm mercury. He did well following this and returned for repeat catheterization with PCI and stenting of the right coronary artery using a bare metal stent on 05/07/2012. At that time his aortic stenosis remained stable with mean transvalvular gradient measured 20 mm mercury at cath. Mean transvalvular gradient across the mitral valve was 11 mm mercury. Pulmonary artery pressures measured 97/32 with mean pulmonary capillary wedge pressure 33.  At that time we discussed treatment alternatives for more definitive management. In particular, after extensive review of the patient's cardiac gated CT angiogram of the heart we felt that it would not be wise to consider transcatheter aortic valve replacement as a potential long-term treatment option, but rather redo median sternotomy for aortic and mitral valve replacement likely be the best choice despite the relative high-risk.  However, clinically he has done very well since undergoing balloon aortic valvuloplasty and PCI and stenting this past spring, and followup echocardiogram performed on 08/29/2012 demonstrated relatively low gradient across the aortic valve and preserved left ventricular size and systolic function.  As a result we have elected to continue to follow him closely as long as he remains stable, with expectation that eventually  his aortic stenosis will likely recur.  He returns to the office today for routine followup.  At present Mr. Bernhard reports that he feels even better than he did at the time of his last visit.  He has very stable symptoms of mild exertional shortness of breath, currently functionally class II. He is reasonably active physically and comfortably doing most of what he wants to do on a daily basis. This past week he went on a trip with his family to [place] and was pleased that he was able to get along remarkably well without having to stop to rest much at all despite a fairly busy a itinerary.  He denies any resting shortness of breath, orthopnea, chest pain, dizzy spells, or syncope.  He is quite pleased with the stability and happy with his current functional status.     Current Outpatient Prescriptions  Medication Sig Dispense Refill  . aspirin 81 MG tablet Take 81 mg by mouth daily.        . clopidogrel (PLAVIX) 75 MG tablet Take 1 tablet (75 mg total) by mouth daily.  30 tablet  5  . co-enzyme Q-10 30 MG capsule Take 30 mg by mouth daily.       Marland Kitchen ezetimibe (ZETIA) 10 MG tablet Take 1 tablet (10 mg total) by mouth daily.  30 tablet  5  . fenofibrate micronized (ANTARA) 130 MG capsule Take 1 capsule (130 mg total) by mouth daily before breakfast.  30 capsule  5  . fish oil-omega-3 fatty acids 1000 MG capsule Take 1 g by mouth 2 (two) times daily.       . furosemide (LASIX)  80 MG tablet Take 1 tablet (80 mg total) by mouth 2 (two) times daily.  60 tablet  5  . insulin glargine (LANTUS) 100 UNIT/ML injection Inject 80 Units into the skin 3 (three) times daily.       Marland Kitchen JANUVIA 100 MG tablet Take 100 mg by mouth daily.       Marland Kitchen LOVAZA 1 G capsule Take 1 g by mouth 2 (two) times daily.       . metFORMIN (GLUCOPHAGE) 1000 MG tablet Take 1 tablet (1,000 mg total) by mouth 2 (two) times daily with a meal. Resume tomorrow 3/8.      . methocarbamol (ROBAXIN) 500 MG tablet Take 1 tablet (500 mg total) by  mouth at bedtime as needed (for cramps).  30 tablet  3  . metoprolol (LOPRESSOR) 25 MG tablet Take 1 tablet (25 mg total) by mouth 2 (two) times daily.  60 tablet  5  . nitroGLYCERIN (NITROSTAT) 0.4 MG SL tablet Place 1 tablet (0.4 mg total) under the tongue every 5 (five) minutes as needed for chest pain. May repeat up to 3 times as needed  30 tablet  5  . pantoprazole (PROTONIX) 40 MG tablet Take 1 tablet (40 mg total) by mouth daily.  90 tablet  1  . Potassium Chloride ER 20 MEQ TBCR Take 20 mEq by mouth 2 (two) times daily.  60 tablet  5  . ramipril (ALTACE) 10 MG tablet Take 1 tablet (10 mg total) by mouth daily.  30 tablet  5  . ranitidine (ZANTAC) 150 MG tablet 150 mg 2 (two) times daily.       . repaglinide (PRANDIN) 2 MG tablet Take 2 mg by mouth 3 (three) times daily before meals.        . rosuvastatin (CRESTOR) 20 MG tablet Take 1 tablet (20 mg total) by mouth at bedtime.  30 tablet  6  . sertraline (ZOLOFT) 50 MG tablet Take 1 tablet (50 mg total) by mouth daily.  90 tablet  0  . Vitamin D, Ergocalciferol, (DRISDOL) 50000 UNITS CAPS Take 50,000 Units by mouth every 7 (seven) days.        . [DISCONTINUED] promethazine (PHENERGAN) 25 MG tablet Take 1/2 to 1 tablet up to every 6 hours as needed for nausea.        No current facility-administered medications for this visit.      Physical Exam:   BP 114/63  Pulse 66  Resp 20  Ht 5\' 10"  (1.778 m)  Wt 247 lb (112.038 kg)  BMI 35.44 kg/m2  SpO2 94%  General:  Well-appearing  Chest:   Clear to auscultation  CV:   Regular rate and rhythm with harsh systolic murmur  Incisions:  n/a  Abdomen:  Soft and nontender  Extremities:  Warm and well-perfused with moderate lower extremity edema  Diagnostic Tests:  n/a   Impression:  Mr. Hannan remains clinically stable. He has done remarkably well since undergoing balloon aortic valvuloplasty and PCI with stenting last spring.  It seems highly likely that his aortic valve disease will  ultimately get worse again and need to be definitively addressed.  However, given the fact that he continues to remain remarkably stable it seems most reasonable to follow him closely and hold off on high-risk surgical intervention at the present time.    Plan:  The patient will plan to see both myself and Dr. Excell Seltzer for followup in 3 months with a repeat echocardiogram.  He will call  and return sooner if his symptoms seem to be worsening.     Salvatore Decent. Cornelius Moras, MD 10/13/2012 5:19 PM

## 2012-10-22 ENCOUNTER — Other Ambulatory Visit: Payer: Self-pay | Admitting: *Deleted

## 2012-10-22 MED ORDER — EZETIMIBE 10 MG PO TABS: 10.0000 mg | ORAL_TABLET | Freq: Every day | ORAL | Status: DC

## 2012-11-06 ENCOUNTER — Other Ambulatory Visit: Payer: Self-pay

## 2012-11-06 MED ORDER — METOPROLOL TARTRATE 25 MG PO TABS: 25.0000 mg | ORAL_TABLET | Freq: Two times a day (BID) | ORAL | Status: DC

## 2012-11-20 ENCOUNTER — Encounter: Payer: Self-pay | Admitting: Cardiovascular Disease

## 2012-11-20 ENCOUNTER — Ambulatory Visit (INDEPENDENT_AMBULATORY_CARE_PROVIDER_SITE_OTHER): Payer: Medicare Other | Admitting: Cardiovascular Disease

## 2012-11-20 VITALS — BP 124/60 | HR 60 | Ht 70.0 in | Wt 247.0 lb

## 2012-11-20 DIAGNOSIS — I359 Nonrheumatic aortic valve disorder, unspecified: Secondary | ICD-10-CM

## 2012-11-20 DIAGNOSIS — I350 Nonrheumatic aortic (valve) stenosis: Secondary | ICD-10-CM

## 2012-11-20 NOTE — Patient Instructions (Addendum)
Your physician recommends that you continue on your current medications as directed. Please refer to the Current Medication list given to you today. Please take your second dose of furosemide between 1-2 PM.   Your physician has requested that you have an echocardiogram in [place]. Echocardiography is a painless test that uses sound waves to create images of your heart. It provides your doctor with information about the size and shape of your heart and how well your heart's chambers and valves are working. This procedure takes approximately one hour. There are no restrictions for this procedure.  Your physician wants you to follow-up in: 6 MONTHS with Dr Excell Seltzer.  You will receive a reminder letter in the mail two months in advance. If you don't receive a letter, please call our office to schedule the follow-up appointment.

## 2012-11-25 ENCOUNTER — Encounter: Payer: Self-pay | Admitting: Cardiovascular Disease

## 2012-11-25 NOTE — Progress Notes (Signed)
HPI:  72 year old gentleman presenting for followup of his rheumatic heart disease. He also has coronary disease status post CABG with severe diffuse small vessel disease. He has had severe aortic stenosis and was treated earlier this year with balloon aortic valvuloplasty. He also has significant mitral valve stenosis. The patient has been evaluated extensively in the multidisciplinary valve clinic. He has been considered for high risk mitral and aortic valve replacement. For now he is being followed with medical therapy because of stable clinical symptoms.  He continues to do well symptomatically. He has mild exertional dyspnea. He continues to feel better than he has in many years. He denies chest pain or palpitations. He's had no lightheadedness, presyncope, or syncope. He's been compliant with his medications. He primarily complains of nocturnal leg cramps. These are worse when he takes Lasix in the evening.    Outpatient Encounter Prescriptions as of 11/20/2012  Medication Sig Dispense Refill  . aspirin 81 MG tablet Take 81 mg by mouth daily.        . clopidogrel (PLAVIX) 75 MG tablet Take 1 tablet (75 mg total) by mouth daily.  30 tablet  5  . Coenzyme Q10 300 MG CAPS Take by mouth daily.      Marland Kitchen ezetimibe (ZETIA) 10 MG tablet Take 1 tablet (10 mg total) by mouth daily.  30 tablet  5  . fenofibrate micronized (ANTARA) 130 MG capsule Take 1 capsule (130 mg total) by mouth daily before breakfast.  30 capsule  5  . fish oil-omega-3 fatty acids 1000 MG capsule Take 1 g by mouth 2 (two) times daily.       . furosemide (LASIX) 80 MG tablet Take 1 tablet (80 mg total) by mouth 2 (two) times daily.  60 tablet  5  . insulin glargine (LANTUS) 100 UNIT/ML injection Inject 80 Units into the skin 3 (three) times daily.       Marland Kitchen JANUVIA 100 MG tablet Take 100 mg by mouth daily.       Marland Kitchen LOVAZA 1 G capsule Take 1 g by mouth 2 (two) times daily.       . metFORMIN (GLUCOPHAGE) 1000 MG tablet Take 1 tablet  (1,000 mg total) by mouth 2 (two) times daily with a meal. Resume tomorrow 3/8.      . methocarbamol (ROBAXIN) 500 MG tablet Take 1 tablet (500 mg total) by mouth at bedtime as needed (for cramps).  30 tablet  3  . metoprolol tartrate (LOPRESSOR) 25 MG tablet Take 1 tablet (25 mg total) by mouth 2 (two) times daily.  60 tablet  5  . nitroGLYCERIN (NITROSTAT) 0.4 MG SL tablet Place 1 tablet (0.4 mg total) under the tongue every 5 (five) minutes as needed for chest pain. May repeat up to 3 times as needed  30 tablet  5  . pantoprazole (PROTONIX) 40 MG tablet Take 1 tablet (40 mg total) by mouth daily.  90 tablet  1  . Potassium Chloride ER 20 MEQ TBCR Take 20 mEq by mouth 2 (two) times daily.  60 tablet  5  . ramipril (ALTACE) 10 MG tablet Take 1 tablet (10 mg total) by mouth daily.  30 tablet  5  . ranitidine (ZANTAC) 150 MG tablet 150 mg 2 (two) times daily.       . repaglinide (PRANDIN) 2 MG tablet Take 2 mg by mouth 3 (three) times daily before meals.        . rosuvastatin (CRESTOR) 20 MG tablet Take  1 tablet (20 mg total) by mouth at bedtime.  30 tablet  6  . sertraline (ZOLOFT) 50 MG tablet Take 1 tablet (50 mg total) by mouth daily.  90 tablet  0  . Vitamin D, Ergocalciferol, (DRISDOL) 50000 UNITS CAPS Take 50,000 Units by mouth every 7 (seven) days.        . [DISCONTINUED] co-enzyme Q-10 30 MG capsule Take 30 mg by mouth daily.        No facility-administered encounter medications on file as of 11/20/2012.    Allergies  Allergen Reactions  . Adhesive [Tape] Rash  . Celecoxib Rash    Past Medical History  Diagnosis Date  . GERD (gastroesophageal reflux disease)   . Hyperlipidemia   . Hypertension   . Diabetes mellitus   . Carotid artery disease   . Cellulitis and abscess of leg, except foot   . Leg edema   . Right knee sprain   . Cramps, muscle, general   . Low back pain   . Hiatal hernia   . Chills   . Hearing loss   . Nasal congestion   . Cough   . Wheezing   .  Generalized headaches   . CAD (coronary artery disease) 11/05/1996    a. CABG x6 by Dr Andrey Campanile - LIMA to Diag+LAD, SVG to OM1+OM2, SVG to PDA+RPL, b. VG->RCA known to be occluded;  c. 05/2012 PCI/Rota/BMS to m/dRCA w/ 3.5x15 Vision BMS x 2 post-dil to 3.75.  Marland Kitchen Mitral stenosis     a. moderate by echo 04/2012.  Marland Kitchen Chronic diastolic congestive heart failure     a. 04/2012 Echo: EF 55-60-%  . Aortic stenosis     a. 04/2012 s/p baloon valvuloplasty w/ reduction in gradient from 47 to 27.  . Pulmonary hypertension     a. 05/2012 RH cath: PA 97/32(54)  . Venous insufficiency (chronic) (peripheral)   . Sleep apnea     no cpap  sleep study >5 yrs    ROS: Negative except as per HPI  BP 124/60  Pulse 60  Ht 5\' 10"  (1.778 m)  Wt 247 lb (112.038 kg)  BMI 35.44 kg/m2  SpO2 92%  PHYSICAL EXAM: Pt is alert and oriented, NAD HEENT: normal Neck: JVP - normal, carotids 2+= without bruits Lungs: CTA bilaterally CV: RRR with harsh grade 3/6 systolic crescendo decrescendo murmur best heard at the right upper sternal border Abd: soft, NT, Positive BS, no hepatomegaly Ext: 2+ pretibial edema bilaterally, stasis ulcerations bilaterally Skin: As above, erythema of both legs is present  EKG:  Sinus rhythm with first-degree AV block, heart rate 60 beats per minute, inferior infarct age undetermined, nonspecific ST abnormality.  ASSESSMENT AND PLAN: 1. Rheumatic heart disease with severe aortic stenosis and moderate to severe mitral stenosis. The patient has complex valvular and cardiac disease. He currently has New [place] Heart Association class II symptoms and will continue with ongoing medical therapy. A followup echocardiogram will be obtained in 2 months and I will plan on seeing him back in followup in 6 months. He is going to followup with Dr. Cornelius Moras in December.  2. Chronic diastolic heart failure likely related to his valvular heart disease. Advised to move his p.m. furosemide dose up to the early afternoon  for better efficacy and hopefully this will prevent his leg cramps at night. Otherwise he will continue on his current regimen.  3. Coronary artery disease, native vessel and bypass graft disease. He remains stable without anginal symptoms. He is  on aspirin and Plavix for dual antiplatelet therapy after coronary stenting earlier this year. He has known severe diffuse distal vessel disease.  Overall he remains clinically stable and will continue with his current medical program.  [name] 11/26/2012 2:21 PM

## 2012-12-05 ENCOUNTER — Other Ambulatory Visit: Payer: Self-pay | Admitting: Family Medicine

## 2012-12-16 ENCOUNTER — Other Ambulatory Visit: Payer: Self-pay

## 2012-12-16 MED ORDER — FENOFIBRATE MICRONIZED 130 MG PO CAPS: 130.0000 mg | ORAL_CAPSULE | Freq: Every day | ORAL | Status: DC

## 2012-12-30 ENCOUNTER — Telehealth (HOSPITAL_COMMUNITY): Payer: Self-pay | Admitting: Cardiac Rehabilitation

## 2012-12-30 NOTE — Telephone Encounter (Signed)
Pt contacted to enroll in cardiac rehab program  Pt states he is not ready to enroll.  He is scheduled for echo next week and will call us back after he receives results when he is interested in enrolling.

## 2013-01-02 ENCOUNTER — Other Ambulatory Visit: Payer: Self-pay | Admitting: Family Medicine

## 2013-01-05 ENCOUNTER — Other Ambulatory Visit: Payer: Self-pay

## 2013-01-05 MED ORDER — PANTOPRAZOLE SODIUM 40 MG PO TBEC: 40.0000 mg | DELAYED_RELEASE_TABLET | Freq: Every day | ORAL | Status: DC

## 2013-01-08 ENCOUNTER — Other Ambulatory Visit: Payer: Self-pay

## 2013-01-12 ENCOUNTER — Ambulatory Visit (HOSPITAL_COMMUNITY): Payer: Medicare Other | Attending: Cardiovascular Disease | Admitting: Radiology

## 2013-01-12 DIAGNOSIS — I350 Nonrheumatic aortic (valve) stenosis: Secondary | ICD-10-CM

## 2013-01-12 DIAGNOSIS — I2510 Atherosclerotic heart disease of native coronary artery without angina pectoris: Secondary | ICD-10-CM | POA: Insufficient documentation

## 2013-01-12 DIAGNOSIS — G4730 Sleep apnea, unspecified: Secondary | ICD-10-CM | POA: Insufficient documentation

## 2013-01-12 DIAGNOSIS — I10 Essential (primary) hypertension: Secondary | ICD-10-CM | POA: Insufficient documentation

## 2013-01-12 DIAGNOSIS — I059 Rheumatic mitral valve disease, unspecified: Secondary | ICD-10-CM | POA: Insufficient documentation

## 2013-01-12 DIAGNOSIS — I509 Heart failure, unspecified: Secondary | ICD-10-CM | POA: Insufficient documentation

## 2013-01-12 DIAGNOSIS — I359 Nonrheumatic aortic valve disorder, unspecified: Secondary | ICD-10-CM | POA: Insufficient documentation

## 2013-01-12 DIAGNOSIS — I379 Nonrheumatic pulmonary valve disorder, unspecified: Secondary | ICD-10-CM | POA: Insufficient documentation

## 2013-01-12 DIAGNOSIS — E119 Type 2 diabetes mellitus without complications: Secondary | ICD-10-CM | POA: Insufficient documentation

## 2013-01-12 DIAGNOSIS — R609 Edema, unspecified: Secondary | ICD-10-CM | POA: Insufficient documentation

## 2013-01-12 DIAGNOSIS — E785 Hyperlipidemia, unspecified: Secondary | ICD-10-CM | POA: Insufficient documentation

## 2013-01-12 NOTE — Progress Notes (Signed)
Echocardiogram performed.  

## 2013-01-19 ENCOUNTER — Encounter (HOSPITAL_COMMUNITY): Payer: Self-pay | Admitting: Cardiovascular Disease

## 2013-02-09 ENCOUNTER — Telehealth: Payer: Self-pay | Admitting: Cardiovascular Disease

## 2013-02-12 ENCOUNTER — Other Ambulatory Visit: Payer: Self-pay

## 2013-02-12 ENCOUNTER — Other Ambulatory Visit: Payer: Self-pay | Admitting: *Deleted

## 2013-02-12 MED ORDER — CLOPIDOGREL BISULFATE 75 MG PO TABS: 75.0000 mg | ORAL_TABLET | Freq: Every day | ORAL | Status: DC

## 2013-02-16 ENCOUNTER — Ambulatory Visit: Payer: Medicare Other | Admitting: Thoracic Surgery (Cardiothoracic Vascular Surgery)

## 2013-02-17 ENCOUNTER — Telehealth (HOSPITAL_COMMUNITY): Payer: Self-pay | Admitting: Cardiac Rehabilitation

## 2013-02-17 NOTE — Telephone Encounter (Signed)
Multiple attempts have made to enroll pt in cardiac rehab program.  Last conversation on 12/29/2012 pt stated he would call to enroll if interested.  No response from pt.

## 2013-02-19 ENCOUNTER — Ambulatory Visit (INDEPENDENT_AMBULATORY_CARE_PROVIDER_SITE_OTHER): Payer: Medicare Other | Admitting: Family Medicine

## 2013-02-19 ENCOUNTER — Encounter: Payer: Self-pay | Admitting: Family Medicine

## 2013-02-19 VITALS — BP 122/50 | HR 68 | Temp 98.6°F | Wt 246.5 lb

## 2013-02-19 DIAGNOSIS — J069 Acute upper respiratory infection, unspecified: Secondary | ICD-10-CM

## 2013-02-19 NOTE — Assessment & Plan Note (Signed)
Mult medical problems at baseline but appears nontoxic, euvolemic and okay for outpatient fu.  Likely viral.  Would treat supportively and f/u prn.  D/w pt.  He agrees.  Would have low threshold for abx use if sx worsen but wouldn't start now.  Call back prn.

## 2013-02-19 NOTE — Patient Instructions (Signed)
Get some rest, take plain mucinex and let us know if you don't improve.  Take care.

## 2013-02-19 NOTE — Progress Notes (Signed)
Pre-visit discussion using our clinic review tool. No additional management support is needed unless otherwise documented below in the visit note.  Recently with cough and cold sx.  Started about a few days.  First noted some upper chest sensation.  Possible fever last night, had some chills.  No ear pain, no ST.  Some rhinorrhea.  No sputum usually, occ clear sputum in AM.  Taking mucinex and otc cough syrup with some relief.  No rash.  She can get a deep breath.  Had a flu shot at endo clinic.  Mult sick contacts.    Meds, vitals, and allergies reviewed.   ROS: See HPI.  Otherwise, noncontributory.  nad ncat Tm wnl Nasal exam stuffy, clear rhinorrhea Mmm, OP wnl Neck supple Rrr, murmur noted ctab Chronic edema noted in the BLE.

## 2013-02-20 ENCOUNTER — Telehealth: Payer: Self-pay

## 2013-02-20 MED ORDER — DOXYCYCLINE HYCLATE 100 MG PO TABS: 100.0000 mg | ORAL_TABLET | Freq: Two times a day (BID) | ORAL | Status: DC

## 2013-02-20 NOTE — Telephone Encounter (Signed)
Then I would start doxy.  rx sent.  Needs eval if worsening. Thanks.

## 2013-02-20 NOTE — Telephone Encounter (Signed)
Patient advised.

## 2013-02-20 NOTE — Telephone Encounter (Signed)
[name] [name]  Said Pt was seen 02/19/13; today pt has 101.2 fever, pt does not want to eat and prod cough with brown phlegm.No wheezing or SOB(no more than usual). No S/T or earache and no body aches and no CP. [place]. Pt request cb.

## 2013-03-09 ENCOUNTER — Ambulatory Visit (INDEPENDENT_AMBULATORY_CARE_PROVIDER_SITE_OTHER): Payer: Medicare Other | Admitting: Thoracic Surgery (Cardiothoracic Vascular Surgery)

## 2013-03-09 ENCOUNTER — Encounter: Payer: Self-pay | Admitting: Thoracic Surgery (Cardiothoracic Vascular Surgery)

## 2013-03-09 VITALS — BP 91/60 | HR 68 | Resp 20 | Ht 70.0 in | Wt 246.0 lb

## 2013-03-09 DIAGNOSIS — I359 Nonrheumatic aortic valve disorder, unspecified: Secondary | ICD-10-CM

## 2013-03-09 DIAGNOSIS — I2510 Atherosclerotic heart disease of native coronary artery without angina pectoris: Secondary | ICD-10-CM

## 2013-03-09 DIAGNOSIS — I099 Rheumatic heart disease, unspecified: Secondary | ICD-10-CM | POA: Insufficient documentation

## 2013-03-09 DIAGNOSIS — Z951 Presence of aortocoronary bypass graft: Secondary | ICD-10-CM

## 2013-03-09 DIAGNOSIS — I080 Rheumatic disorders of both mitral and aortic valves: Secondary | ICD-10-CM

## 2013-03-09 DIAGNOSIS — I509 Heart failure, unspecified: Secondary | ICD-10-CM

## 2013-03-09 DIAGNOSIS — I2789 Other specified pulmonary heart diseases: Secondary | ICD-10-CM

## 2013-03-09 DIAGNOSIS — I25810 Atherosclerosis of coronary artery bypass graft(s) without angina pectoris: Secondary | ICD-10-CM

## 2013-03-09 DIAGNOSIS — I272 Pulmonary hypertension, unspecified: Secondary | ICD-10-CM

## 2013-03-09 DIAGNOSIS — I5032 Chronic diastolic (congestive) heart failure: Secondary | ICD-10-CM

## 2013-03-09 DIAGNOSIS — I350 Nonrheumatic aortic (valve) stenosis: Secondary | ICD-10-CM

## 2013-03-09 DIAGNOSIS — I050 Rheumatic mitral stenosis: Secondary | ICD-10-CM

## 2013-03-09 NOTE — Progress Notes (Signed)
[address]       [place] [address]             [phone]     CARDIOTHORACIC SURGERY OFFICE NOTE  Referring Provider is Wall, Marijo Conception, MD PCP is [name], MD   HPI:  [name] [name] returns for followup of rheumatic aortic stenosis and mitral stenosis, coronary artery disease with previous CABG, and chronic diastolic congestive heart failure with severe pulmonary hypertension. He was originally seen in consultation on 03/10/2012 and since then he underwent balloon aortic valvuloplasty by Dr. [name] on 04/28/2012. His mean transvalvular gradient at catheterization decreased from 47 to 27 mm mercury. He did well following this and returned for repeat catheterization with PCI and stenting of the right coronary artery using a bare metal stent on 05/07/2012. At that time his aortic stenosis remained stable with mean transvalvular gradient measured 20 mm mercury at cath. Mean transvalvular gradient across the mitral valve was 11 mm mercury. Pulmonary artery pressures measured 97/32 with mean pulmonary capillary wedge pressure 33. At that time we discussed treatment alternatives for more definitive management. In particular, after extensive review of the patient's cardiac gated CT angiogram of the heart we felt that it would not be wise to consider transcatheter aortic valve replacement as a potential long-term treatment option, but rather redo median sternotomy for aortic and mitral valve replacement likely be the best choice despite the relative high-risk. However, clinically he has done very well since undergoing balloon aortic valvuloplasty and PCI and stenting this past spring, and followup echocardiogram performed on 08/29/2012 demonstrated relatively low gradient across the aortic valve and preserved left ventricular size and systolic function. As a result we have elected to continue to follow him closely as long as he remains stable, with expectation that eventually his  aortic stenosis will likely recur.   [name] [name] returns to the office today for routine followup.  He was last seen here in the office 10/13/2012.  He underwent followup echocardiogram in November and clinically he has done very well until last month when he developed flulike illness including productive cough, low grade fever and chills.  He states that as the symptoms progressed he developed severe shortness of breath. He was seen by his primary care physician and ultimately treated with a course of doxycycline as an outpatient. The patient states that he came close to going to the emergency department because of severe shortness of breath, but ultimately the cough subsided and his breathing has improved. He states he still not quite back to baseline, but the cough has resolved and his dyspnea is much improved. He has not had worsening of lower extremity edema or generalized fluid retention. His appetite has been poor, but he states that it's improving. Prior to getting ill last month he remained stable with class II symptoms of exertional shortness of breath with stable severe bilateral lower extremity edema. He has not had any chest pain or chest tightness suspicious for recurrent angina pectoris, although he states that he did have some "tingling" chest pain associated with his viral illness last month.    Current Outpatient Prescriptions  Medication Sig Dispense Refill  . aspirin 81 MG tablet Take 81 mg by mouth daily.        . clopidogrel (PLAVIX) 75 MG tablet Take 1 tablet (75 mg total) by mouth daily.  30 tablet  5  . Coenzyme Q10 300 MG CAPS Take by mouth daily.      Marland Kitchen  doxycycline (VIBRA-TABS) 100 MG tablet Take 1 tablet (100 mg total) by mouth 2 (two) times daily.  20 tablet  0  . ezetimibe (ZETIA) 10 MG tablet Take 1 tablet (10 mg total) by mouth daily.  30 tablet  5  . fenofibrate micronized (ANTARA) 130 MG capsule Take 1 capsule (130 mg total) by mouth daily before breakfast.  30 capsule  5   . fish oil-omega-3 fatty acids 1000 MG capsule Take 1 g by mouth 2 (two) times daily.       . furosemide (LASIX) 80 MG tablet Take 1 tablet (80 mg total) by mouth 2 (two) times daily.  60 tablet  5  . insulin glargine (LANTUS) 100 UNIT/ML injection Inject 80 Units into the skin 3 (three) times daily.       Marland Kitchen JANUVIA 100 MG tablet Take 100 mg by mouth daily.       Marland Kitchen LOVAZA 1 G capsule Take 1 g by mouth 2 (two) times daily.       . metFORMIN (GLUCOPHAGE) 1000 MG tablet Take 1 tablet (1,000 mg total) by mouth 2 (two) times daily with a meal. Resume tomorrow 3/8.      . methocarbamol (ROBAXIN) 500 MG tablet Take 1 tablet (500 mg total) by mouth at bedtime as needed (for cramps).  30 tablet  3  . metoprolol tartrate (LOPRESSOR) 25 MG tablet Take 1 tablet (25 mg total) by mouth 2 (two) times daily.  60 tablet  5  . nitroGLYCERIN (NITROSTAT) 0.4 MG SL tablet Place 1 tablet (0.4 mg total) under the tongue every 5 (five) minutes as needed for chest pain. May repeat up to 3 times as needed  30 tablet  5  . pantoprazole (PROTONIX) 40 MG tablet Take 1 tablet (40 mg total) by mouth daily.  90 tablet  3  . Potassium Chloride ER 20 MEQ TBCR Take 20 mEq by mouth 2 (two) times daily.  60 tablet  5  . ramipril (ALTACE) 10 MG tablet Take 1 tablet (10 mg total) by mouth daily.  30 tablet  5  . ranitidine (ZANTAC) 150 MG tablet 150 mg 2 (two) times daily.       . repaglinide (PRANDIN) 2 MG tablet Take 2 mg by mouth 3 (three) times daily before meals.        . rosuvastatin (CRESTOR) 20 MG tablet Take 1 tablet (20 mg total) by mouth at bedtime.  30 tablet  6  . sertraline (ZOLOFT) 50 MG tablet TAKE 1 TABLET BY MOUTH DAILY  90 tablet  0  . Vitamin D, Ergocalciferol, (DRISDOL) 50000 UNITS CAPS Take 50,000 Units by mouth every 7 (seven) days.        . [DISCONTINUED] promethazine (PHENERGAN) 25 MG tablet Take 1/2 to 1 tablet up to every 6 hours as needed for nausea.        No current facility-administered medications for  this visit.      Physical Exam:   BP 91/60  Pulse 68  Resp 20  Ht 5\' 10"  (1.778 m)  Wt 246 lb (111.585 kg)  BMI 35.30 kg/m2  SpO2 93%  General:  Well-appearing  Chest:   Clear to auscultation with symmetrical breath sounds  CV:   Regular rate and rhythm with soft systolic murmur heard best at sternal border  Incisions:  n/a  Abdomen:  Soft and nontender  Extremities:  Warm and well-perfused with severe bilateral lower extremity edema  Diagnostic Tests:  Transthoracic Echocardiography  (Report amended )  Patient: [name] [name], [name] [name] [name] #: [id number] Study Date: 01/12/2013 Gender: M Age: 73 Height: 177.8cm Weight: 112kg BSA: 2.89m^2 Pt. Status: Room:  SONOGRAPHER Charlann Noss, RDCS ATTENDING Catalina [place]ig[place], Gala Lewandowsky PERFORMING [facility] cc: Dr. [name]  ------------------------------------------------------------ LV EF: 55% - 60%  ------------------------------------------------------------ Indications: Aortic stenosis 424.1.  ------------------------------------------------------------ History: PMH: Acquired from the patient and from the patient's chart. Bilateral lower extremity edema. Coronary artery disease. Congestive heart failure. Aortic valve disease. Mitral valve disease. Primary pulmonary hypertension. Risk factors: Sleep apnea. Hypertension. Diabetes mellitus. Dyslipidemia.  ------------------------------------------------------------ Study Conclusions  - Left ventricle: The cavity size was normal. Wall thickness was increased in a pattern of severe LVH. Systolic function was normal. The estimated ejection fraction was in the range of 55% to 60%. There is hypokinesis of the apical myocardium. - Aortic valve: Valve mobility was restricted. There was moderate to severe stenosis. - Mitral valve: Severely calcified annulus. The findings are consistent with moderate stenosis. Valve area by  pressure half-time: 1.35cm^2. - Left atrium: The atrium was mildly dilated. Impressions:  - Normal LV function, moderate to severe AS with mean gradient of 37 mmHg, moderate MS; compared to study of 6/14, aortic valve gradient has increased.  ------------------------------------------------------------ Labs, prior tests, procedures, and surgery: Coronary artery bypass grafting.  Transthoracic echocardiography. M-mode, complete 2D, spectral Doppler, and color Doppler. Height: Height: 177.8cm. Height: 70in. Weight: Weight: 112kg. Weight: 246.5lb. Body mass index: BMI: 35.4kg/m^2. Body surface area: BSA: 2.63m^2. Blood pressure: 124/60. Patient status: Outpatient. Location: [place] [facility]  ------------------------------------------------------------  ------------------------------------------------------------ Left ventricle: The cavity size was normal. Wall thickness was increased in a pattern of severe LVH. Systolic function was normal. The estimated ejection fraction was in the range of 55% to 60%. Regional wall motion abnormalities: There is hypokinesis of the apical myocardium.  ------------------------------------------------------------ Aortic valve: Trileaflet; moderately calcified leaflets. Valve mobility was restricted. Doppler: There was moderate to severe stenosis. No regurgitation. VTI ratio of LVOT to aortic valve: 0.21. Valve area: 0.54cm^2(VTI). Indexed valve area: 0.24cm^2/m^2 (VTI). Peak velocity ratio of LVOT to aortic valve: 0.21. Valve area: 0.54cm^2 (Vmax). Indexed valve area: 0.24cm^2/m^2 (Vmax). Mean gradient: 15mm Hg (S). Peak gradient: 51mm Hg (S).  ------------------------------------------------------------ Aorta: Aortic root: The aortic root was normal in size.  ------------------------------------------------------------ Mitral valve: Severely calcified annulus. Mobility was not restricted. Doppler: The findings are consistent with moderate  stenosis. Trivial regurgitation. Valve area by pressure half-time: 1.35cm^2. Indexed valve area by pressure half-time: 0.59cm^2/m^2. Indexed valve area by continuity equation (using LVOT flow): 0.24cm^2/m^2. Mean gradient: 14mm Hg (D). Peak gradient: 85mm Hg (D).  ------------------------------------------------------------ Left atrium: The atrium was mildly dilated.  ------------------------------------------------------------ Right ventricle: The cavity size was normal. Systolic function was normal.  ------------------------------------------------------------ Pulmonic valve: Doppler: Transvalvular velocity was within the normal range. There was no evidence for stenosis.  ------------------------------------------------------------ Tricuspid valve: Structurally normal valve. Doppler: Transvalvular velocity was within the normal range. No regurgitation.  ------------------------------------------------------------ Right atrium: The atrium was normal in size.  ------------------------------------------------------------ Pericardium: There was no pericardial effusion.  ------------------------------------------------------------ Systemic veins: Inferior vena cava: The vessel was normal in size.  ------------------------------------------------------------  2D measurements Normal Doppler measurements Norma Left ventricle l LVID ED, 51.3 mm 43-52 LVOT chord, Peak vel, 84. cm/s ----- PLAX S 4 LVID ES, 29 mm 23-38 VTI, S 21 cm ----- chord, Stroke 53. ml ----- PLAX vol 4 FS, 43 % >29 Stroke 23. ml/m^2 ----- chord, index 4 PLAX Aortic valve LVPW, ED 16.76 mm ------  Peak vel, 395 cm/s ----- IVS/LVPW 0.96 <1.3 S ratio, ED Mean vel, 277 cm/s ----- Ventricular septum S IVS, ED 16.12 mm ------ VTI, S 99. cm ----- LVOT 1 Diam, S 18 mm ------ Mean 37 mm Hg ----- Area 2.54 cm^2 ------ gradient, Diam 18 mm ------ S Aorta Peak 62 mm Hg ----- Root 34 mm ------ gradient, diam, ED  S Left atrium VTI ratio 0.2 ----- AP dim 46 mm ------ LVOT/AV 1 AP dim 2.02 cm/m^2 <2.2 Area, VTI 0.5 cm^2 ----- index 4 Area 0.2 cm^2/m^2 ----- index 4 (VTI) Peak vel 0.2 ----- ratio, 1 LVOT/AV Area, 0.5 cm^2 ----- Vmax 4 Area 0.2 cm^2/m^2 ----- index 4 (Vmax) Mitral valve Mean vel, 142 cm/s ----- D Pressure 163 ms ----- half-time Mean 10 mm Hg ----- gradient, D Peak 30 mm Hg ----- gradient, D Area 1.3 cm^2 ----- (PHT) 5 Area 0.5 cm^2/m^2 ----- index 9 (PHT) Area 0.2 cm^2/m^2 ----- index 4 (LVOT cont) Annulus 99. cm ----- VTI 6 Systemic veins Estimated 5 mm Hg ----- CVP Right ventricle Sa vel, 6.8 cm/s ----- lat ann, tiss DP  ------------------------------------------------------------ Burke Keels, Aaron Edelman 2014-11-10T13:12:[id number]    Impression:  [name] [name] has remained essentially stable from a cardiovascular standpoint although it sounds as though his recent bout of the flu came close to putting him in the hospital with acute respiratory failure. At this point it seems as though he is nearly back to his baseline. However, a followup echocardiogram performed in November reveals that the aortic stenosis appears to be recurring with peak velocity across the aortic valve measuring close to 4 m/s. Left ventricular systolic function remains stable.   Plan:  I discussed options at length with the patient and his wife in the office today. In the short-term I think it makes sense to let him completely recover from his recent bout of the flu and make certain that his heart failure symptoms have completely stabilized. However, I suspect that we will need to make a decision in the near future whether or not to proceed with high-risk surgery for treatment of his underlying valvular heart disease and coronary artery disease. In association with surgery I have quoted him a 25% risk of mortality and 50% likelihood that he might survive surgery to enjoy a quality of  life similar to that which he has experienced in recent months for a prolonged period time.  All of his questions been addressed.  We will plan to see him back in 3 months.   Valentina Gu. Roxy Manns, MD 03/09/2013 12:16 PM

## 2013-03-11 ENCOUNTER — Other Ambulatory Visit: Payer: Self-pay | Admitting: Family Medicine

## 2013-03-11 NOTE — Telephone Encounter (Signed)
Please refill times one in PCP absence  thanks 

## 2013-03-11 NOTE — Telephone Encounter (Signed)
done

## 2013-03-16 ENCOUNTER — Other Ambulatory Visit: Payer: Self-pay

## 2013-03-16 ENCOUNTER — Telehealth: Payer: Self-pay | Admitting: *Deleted

## 2013-03-16 MED ORDER — RAMIPRIL 10 MG PO CAPS: 10.0000 mg | ORAL_CAPSULE | Freq: Every day | ORAL | Status: DC

## 2013-03-16 NOTE — Telephone Encounter (Signed)
SPOKE  WITH  WIFE  , PT  HAD  AN EPISODE  Friday   CAME  HOME  AFTER   EATING OUT   CAME INTO HOUSE   AND  HAD  TO PUT HEAD  DOWN ON  BACK OF CHAIR COULD NOT MOVE  HAD  TO BRING CHAIR TO PT FROM [facility] COULD  NOT  WALK AROUND TO  SIT   HAS ONLY HAD  ONE  EPISODE  FEELS  BETTER NOW   DID  NOT  CHECK  B/P OR  PULSE   ENCOURAGED TO PURCHASE MONITOR  PER  WIFE  PT HAD APPT  WITH DR Roxy Manns LAST  WEEK  B/P  AT THAT  TIME WAS  91/60  NO CHANGES WERE MADE TO MEDICINE  WILL FORWARD TO DR [name]   PT  HAS APPT  WITH [facility]   ON  03-17-13 AT  11:30 AM./CY

## 2013-03-17 ENCOUNTER — Ambulatory Visit: Payer: Medicare Other | Admitting: Nurse Practitioner

## 2013-03-18 ENCOUNTER — Ambulatory Visit (INDEPENDENT_AMBULATORY_CARE_PROVIDER_SITE_OTHER): Payer: Medicare Other | Admitting: Cardiovascular Disease

## 2013-03-18 ENCOUNTER — Other Ambulatory Visit: Payer: Self-pay | Admitting: Cardiovascular Disease

## 2013-03-18 ENCOUNTER — Encounter: Payer: Self-pay | Admitting: Cardiovascular Disease

## 2013-03-18 VITALS — BP 90/54 | HR 60 | Ht 70.0 in | Wt 245.5 lb

## 2013-03-18 DIAGNOSIS — I5031 Acute diastolic (congestive) heart failure: Secondary | ICD-10-CM

## 2013-03-18 DIAGNOSIS — I350 Nonrheumatic aortic (valve) stenosis: Secondary | ICD-10-CM

## 2013-03-18 DIAGNOSIS — I5033 Acute on chronic diastolic (congestive) heart failure: Secondary | ICD-10-CM

## 2013-03-18 DIAGNOSIS — I959 Hypotension, unspecified: Secondary | ICD-10-CM

## 2013-03-18 DIAGNOSIS — I359 Nonrheumatic aortic valve disorder, unspecified: Secondary | ICD-10-CM

## 2013-03-18 NOTE — Patient Instructions (Signed)
Your physician has recommended you make the following change in your medication:  HOLD your Ramipril and Plavix today and tomorrow  You are being admitted to [facility] tomorrow, Thursday January 15 for acute on chronic diastolic heart failure.  Someone from the admitting office of the hospital will call you tomorrow with your room assignment.  We will follow-up with you after your hospitalization

## 2013-03-18 NOTE — Progress Notes (Signed)
HPI:  Mr Vivero returns for cardiac follow-up. He is followed for rheumatic heart disease with aortic and mitral stenosis, coronary artery disease with history of CABG and PCI, chronic diastolic heart failure, and severe pulmonary hypertension. Approximately one year ago he underwent balloon aortic valvuloplasty followed by PCI of severe stenosis in the right coronary artery. He was considered for transcatheter aortic valve replacement, but because of severe calcification in the mitral-aortic intervascular fibrosa, we felt that this would not be a good treatment option. He has been considered for redo median sternotomy with aortic and mitral valve replacement. His symptoms have been relatively stable so medical therapy has been recommended. He initially had a good hemodynamic response to balloon aortic valvuloplasty. However, recent echocardiogram demonstrated the presence of recurrent severe aortic stenosis.  Had a respiratory illness in mid December. He was sick for several days, but has now recovered from this. He described a severe cough which has resolved.  About 4 days ago, the patient had been out working for much of the day. When he walked back into his house, he became weak and lightheaded. He sat in a chair and could "barely move." He felt like his blood pressure was very low. He nearly lost consciousness, but after several minutes he began to feel better. There was no associated chest pain or pressure. Since that time, he has felt weak. He also describes feeling "woozy." He has not had further pre-syncope.  The patient also complains of progressive shortness of breath with lower levels of activity. He denies orthopnea or PND. His chronic leg swelling is unchanged. He denies chest pain or pressure. He is now dyspneic with walking short distance on level ground (New York Heart Association class III).  Outpatient Encounter Prescriptions as of 03/18/2013  Medication Sig  . aspirin 81 MG tablet  Take 81 mg by mouth daily.    . clopidogrel (PLAVIX) 75 MG tablet Take 1 tablet (75 mg total) by mouth daily.  . Coenzyme Q10 300 MG CAPS Take by mouth daily.  Marland Kitchen doxycycline (VIBRA-TABS) 100 MG tablet Take 1 tablet (100 mg total) by mouth 2 (two) times daily.  Marland Kitchen ezetimibe (ZETIA) 10 MG tablet Take 1 tablet (10 mg total) by mouth daily.  . fenofibrate micronized (ANTARA) 130 MG capsule Take 1 capsule (130 mg total) by mouth daily before breakfast.  . fish oil-omega-3 fatty acids 1000 MG capsule Take 1 g by mouth 2 (two) times daily.   . furosemide (LASIX) 80 MG tablet Take 1 tablet (80 mg total) by mouth 2 (two) times daily.  . insulin glargine (LANTUS) 100 UNIT/ML injection Inject 80 Units into the skin 3 (three) times daily.   Marland Kitchen JANUVIA 100 MG tablet Take 100 mg by mouth daily.   Marland Kitchen LOVAZA 1 G capsule Take 1 g by mouth 2 (two) times daily.   . metFORMIN (GLUCOPHAGE) 1000 MG tablet Take 1 tablet (1,000 mg total) by mouth 2 (two) times daily with a meal. Resume tomorrow 3/8.  . methocarbamol (ROBAXIN) 500 MG tablet Take 1 tablet (500 mg total) by mouth at bedtime as needed (for cramps).  . metoprolol tartrate (LOPRESSOR) 25 MG tablet Take 1 tablet (25 mg total) by mouth 2 (two) times daily.  . nitroGLYCERIN (NITROSTAT) 0.4 MG SL tablet Place 1 tablet (0.4 mg total) under the tongue every 5 (five) minutes as needed for chest pain. May repeat up to 3 times as needed  . pantoprazole (PROTONIX) 40 MG tablet Take 1 tablet (  40 mg total) by mouth daily.  . Potassium Chloride ER 20 MEQ TBCR Take 20 mEq by mouth 2 (two) times daily.  . ramipril (ALTACE) 10 MG capsule Take 1 capsule (10 mg total) by mouth daily.  . ranitidine (ZANTAC) 150 MG tablet 150 mg 2 (two) times daily.   . repaglinide (PRANDIN) 2 MG tablet Take 2 mg by mouth 3 (three) times daily before meals.    . rosuvastatin (CRESTOR) 20 MG tablet Take 1 tablet (20 mg total) by mouth at bedtime.  . sertraline (ZOLOFT) 50 MG tablet TAKE 1 TABLET BY  MOUTH DAILY  . Vitamin D, Ergocalciferol, (DRISDOL) 50000 UNITS CAPS Take 50,000 Units by mouth every 7 (seven) days.      Allergies  Allergen Reactions  . Adhesive [Tape] Rash  . Celecoxib Rash    Past Medical History  Diagnosis Date  . GERD (gastroesophageal reflux disease)   . Hyperlipidemia   . Hypertension   . Diabetes mellitus   . Carotid artery disease   . Cellulitis and abscess of leg, except foot   . Leg edema   . Right knee sprain   . Cramps, muscle, general   . Low back pain   . Hiatal hernia   . Chills   . Hearing loss   . Nasal congestion   . Cough   . Wheezing   . Generalized headaches   . CAD (coronary artery disease) 11/05/1996    a. CABG x6 by Dr Redmond Pulling - LIMA to Diag+LAD, SVG to OM1+OM2, SVG to PDA+RPL, b. VG->RCA known to be occluded;  c. 05/2012 PCI/Rota/BMS to m/dRCA w/ 3.5x15 Vision BMS x 2 post-dil to 3.75.  Marland Kitchen Mitral stenosis     a. moderate by echo 04/2012.  Marland Kitchen Chronic diastolic congestive heart failure     a. 04/2012 Echo: EF 55-60-%  . Aortic stenosis     a. 04/2012 s/p baloon valvuloplasty w/ reduction in gradient from 47 to 27.  . Pulmonary hypertension     a. 05/2012 RH cath: PA 97/32(54)  . Venous insufficiency (chronic) (peripheral)   . Sleep apnea     no cpap  sleep study >5 yrs    ROS: Negative except as per HPI  BP 80/56  Pulse 60  Ht 5\' 10"  (1.778 m)  Wt 245 lb 8 oz (111.358 kg)  BMI 35.23 kg/m2  PHYSICAL EXAM: Pt is alert and oriented, pleasant overweight male in NAD HEENT: normal Neck: JVP - normal, carotids 2+= without bruits Lungs: CTA bilaterally CV: RRR with grade 3/6 harsh systolic murmur at the right upper sternal border Abd: soft, NT, Positive BS, no hepatomegaly Ext: Diffuse 2+ pretibial and calf edema with marked changes of chronic stasis bilaterally and extensive stasis ulcerations on the left leg  EKG:  Sinus bradycardia with first degree AV block, heart rate 58 beats per minute, right superior axis deviation, cannot  rule out age-indeterminate inferior MI.  2D Echo: Left ventricle: The cavity size was normal. Wall thickness was increased in a pattern of severe LVH. Systolic function was normal. The estimated ejection fraction was in the range of 55% to 60%. Regional wall motion abnormalities: There is hypokinesis of the apical myocardium.  ------------------------------------------------------------ Aortic valve: Trileaflet; moderately calcified leaflets. Valve mobility was restricted. Doppler: There was moderate to severe stenosis. No regurgitation. VTI ratio of LVOT to aortic valve: 0.21. Valve area: 0.54cm^2(VTI). Indexed valve area: 0.24cm^2/m^2 (VTI). Peak velocity ratio of LVOT to aortic valve: 0.21. Valve area: 0.54cm^2 (Vmax). Indexed  valve area: 0.24cm^2/m^2 (Vmax). Mean gradient: 36mm Hg (S). Peak gradient: 33mm Hg (S).  ------------------------------------------------------------ Aorta: Aortic root: The aortic root was normal in size.  ------------------------------------------------------------ Mitral valve: Severely calcified annulus. Mobility was not restricted. Doppler: The findings are consistent with moderate stenosis. Trivial regurgitation. Valve area by pressure half-time: 1.35cm^2. Indexed valve area by pressure half-time: 0.59cm^2/m^2. Indexed valve area by continuity equation (using LVOT flow): 0.24cm^2/m^2. Mean gradient: 2mm Hg (D). Peak gradient: 59mm Hg (D).  ------------------------------------------------------------ Left atrium: The atrium was mildly dilated.  ------------------------------------------------------------ Right ventricle: The cavity size was normal. Systolic function was normal.  ------------------------------------------------------------ Pulmonic valve: Doppler: Transvalvular velocity was within the normal range. There was no evidence for stenosis.  ------------------------------------------------------------ Tricuspid valve: Structurally  normal valve. Doppler: Transvalvular velocity was within the normal range. No regurgitation.  ------------------------------------------------------------ Right atrium: The atrium was normal in size.  ------------------------------------------------------------ Pericardium: There was no pericardial effusion.  ------------------------------------------------------------ Systemic veins: Inferior vena cava: The vessel was normal in size.  ASSESSMENT AND PLAN: 1. Acute on chronic diastolic heart failure, New York Heart Association class III.  2. Extensive coronary artery disease status post CABG and PCI  3. Rheumatic heart disease with severe aortic and mitral valve stenosis  4. Hypotension and presyncope  The patient's symptoms are very concerning in the setting of his severe valvular heart disease. His cardiac situation is also complicated by extensive coronary artery disease with a very severe distal vessel disease pattern. He clearly has worsening symptoms of heart failure , now with associated low blood pressure. I have asked him to hold his ACE inhibitor. He will continue low-dose metoprolol. I have recommended hospital admission tomorrow in preparation for cardiac catheterization and possible cardiac surgery. I have discussed his case with Dr Roxy Manns who knows the patient very well. He will see him in the hospital as well. I think it is important to admit him for medical management of his congestive heart failure prior to undergoing cardiac catheterization which I would anticipate performing January 16. I have reviewed the risks, indications, and alternatives to diagnostic right and left heart catheterization with graft angiography. The patient and his wife understand and agree to proceed. I also asked him to hold Plavix in anticipation of cardiac surgery.  I had extensive discussion with the patient and his wife today about plans for further evaluation and treatment. We discussed options of  palliative therapy such as ongoing medical treatment and/or balloon aortic valvuloplasty versus definitive but high-risk treatment with redo cardiac surgery. They favor the latter approach. They will have further discussion with Dr. Roxy Manns once he is hospitalized tomorrow.  Time spent greater than 45 minutes over 50% of which was in discussion with the patient and his wife.   [name] 03/18/2013 6:17 PM

## 2013-03-19 ENCOUNTER — Encounter (HOSPITAL_COMMUNITY): Payer: Self-pay | Admitting: General Practice

## 2013-03-19 ENCOUNTER — Encounter (HOSPITAL_COMMUNITY): Payer: Self-pay | Admitting: Pharmacy Technician

## 2013-03-19 ENCOUNTER — Inpatient Hospital Stay (HOSPITAL_COMMUNITY)
Admission: RE | Admit: 2013-03-19 | Discharge: 2013-03-21 | DRG: 287 | Disposition: A | Discharge status: Home / Self Care | Payer: Medicare Other | Source: Ambulatory Visit | Attending: Cardiovascular Disease | Admitting: Cardiovascular Disease

## 2013-03-19 DIAGNOSIS — I6529 Occlusion and stenosis of unspecified carotid artery: Secondary | ICD-10-CM

## 2013-03-19 DIAGNOSIS — K5289 Other specified noninfective gastroenteritis and colitis: Secondary | ICD-10-CM

## 2013-03-19 DIAGNOSIS — L989 Disorder of the skin and subcutaneous tissue, unspecified: Secondary | ICD-10-CM

## 2013-03-19 DIAGNOSIS — IMO0002 Reserved for concepts with insufficient information to code with codable children: Secondary | ICD-10-CM

## 2013-03-19 DIAGNOSIS — I129 Hypertensive chronic kidney disease with stage 1 through stage 4 chronic kidney disease, or unspecified chronic kidney disease: Secondary | ICD-10-CM | POA: Diagnosis present

## 2013-03-19 DIAGNOSIS — Z951 Presence of aortocoronary bypass graft: Secondary | ICD-10-CM

## 2013-03-19 DIAGNOSIS — E669 Obesity, unspecified: Secondary | ICD-10-CM

## 2013-03-19 DIAGNOSIS — M720 Palmar fascial fibromatosis [Dupuytren]: Secondary | ICD-10-CM

## 2013-03-19 DIAGNOSIS — I2789 Other specified pulmonary heart diseases: Secondary | ICD-10-CM | POA: Diagnosis present

## 2013-03-19 DIAGNOSIS — R252 Cramp and spasm: Secondary | ICD-10-CM

## 2013-03-19 DIAGNOSIS — I5032 Chronic diastolic (congestive) heart failure: Secondary | ICD-10-CM

## 2013-03-19 DIAGNOSIS — I10 Essential (primary) hypertension: Secondary | ICD-10-CM

## 2013-03-19 DIAGNOSIS — L03119 Cellulitis of unspecified part of limb: Secondary | ICD-10-CM

## 2013-03-19 DIAGNOSIS — I872 Venous insufficiency (chronic) (peripheral): Secondary | ICD-10-CM

## 2013-03-19 DIAGNOSIS — I2582 Chronic total occlusion of coronary artery: Secondary | ICD-10-CM | POA: Diagnosis present

## 2013-03-19 DIAGNOSIS — L02419 Cutaneous abscess of limb, unspecified: Secondary | ICD-10-CM

## 2013-03-19 DIAGNOSIS — L570 Actinic keratosis: Secondary | ICD-10-CM

## 2013-03-19 DIAGNOSIS — M545 Low back pain, unspecified: Secondary | ICD-10-CM

## 2013-03-19 DIAGNOSIS — R059 Cough, unspecified: Secondary | ICD-10-CM

## 2013-03-19 DIAGNOSIS — I080 Rheumatic disorders of both mitral and aortic valves: Secondary | ICD-10-CM | POA: Diagnosis present

## 2013-03-19 DIAGNOSIS — I272 Pulmonary hypertension, unspecified: Secondary | ICD-10-CM

## 2013-03-19 DIAGNOSIS — R0989 Other specified symptoms and signs involving the circulatory and respiratory systems: Secondary | ICD-10-CM

## 2013-03-19 DIAGNOSIS — K449 Diaphragmatic hernia without obstruction or gangrene: Secondary | ICD-10-CM

## 2013-03-19 DIAGNOSIS — I2510 Atherosclerotic heart disease of native coronary artery without angina pectoris: Secondary | ICD-10-CM | POA: Diagnosis present

## 2013-03-19 DIAGNOSIS — L98499 Non-pressure chronic ulcer of skin of other sites with unspecified severity: Secondary | ICD-10-CM

## 2013-03-19 DIAGNOSIS — I099 Rheumatic heart disease, unspecified: Secondary | ICD-10-CM

## 2013-03-19 DIAGNOSIS — R0600 Dyspnea, unspecified: Secondary | ICD-10-CM

## 2013-03-19 DIAGNOSIS — E1149 Type 2 diabetes mellitus with other diabetic neurological complication: Secondary | ICD-10-CM

## 2013-03-19 DIAGNOSIS — K219 Gastro-esophageal reflux disease without esophagitis: Secondary | ICD-10-CM | POA: Diagnosis present

## 2013-03-19 DIAGNOSIS — I509 Heart failure, unspecified: Secondary | ICD-10-CM | POA: Diagnosis present

## 2013-03-19 DIAGNOSIS — I359 Nonrheumatic aortic valve disorder, unspecified: Secondary | ICD-10-CM

## 2013-03-19 DIAGNOSIS — E785 Hyperlipidemia, unspecified: Secondary | ICD-10-CM

## 2013-03-19 DIAGNOSIS — E119 Type 2 diabetes mellitus without complications: Secondary | ICD-10-CM | POA: Diagnosis present

## 2013-03-19 DIAGNOSIS — R609 Edema, unspecified: Secondary | ICD-10-CM

## 2013-03-19 DIAGNOSIS — R197 Diarrhea, unspecified: Secondary | ICD-10-CM

## 2013-03-19 DIAGNOSIS — S60459A Superficial foreign body of unspecified finger, initial encounter: Secondary | ICD-10-CM

## 2013-03-19 DIAGNOSIS — I25810 Atherosclerosis of coronary artery bypass graft(s) without angina pectoris: Secondary | ICD-10-CM | POA: Diagnosis present

## 2013-03-19 DIAGNOSIS — E875 Hyperkalemia: Secondary | ICD-10-CM

## 2013-03-19 DIAGNOSIS — K37 Unspecified appendicitis: Secondary | ICD-10-CM

## 2013-03-19 DIAGNOSIS — I350 Nonrheumatic aortic (valve) stenosis: Secondary | ICD-10-CM

## 2013-03-19 DIAGNOSIS — Z7401 Bed confinement status: Secondary | ICD-10-CM

## 2013-03-19 DIAGNOSIS — I959 Hypotension, unspecified: Secondary | ICD-10-CM | POA: Diagnosis present

## 2013-03-19 DIAGNOSIS — Z9889 Other specified postprocedural states: Secondary | ICD-10-CM

## 2013-03-19 DIAGNOSIS — D3A8 Other benign neuroendocrine tumors: Secondary | ICD-10-CM

## 2013-03-19 DIAGNOSIS — G4730 Sleep apnea, unspecified: Secondary | ICD-10-CM

## 2013-03-19 DIAGNOSIS — N189 Chronic kidney disease, unspecified: Secondary | ICD-10-CM | POA: Diagnosis present

## 2013-03-19 DIAGNOSIS — I5033 Acute on chronic diastolic (congestive) heart failure: Principal | ICD-10-CM | POA: Diagnosis present

## 2013-03-19 DIAGNOSIS — I70209 Unspecified atherosclerosis of native arteries of extremities, unspecified extremity: Secondary | ICD-10-CM

## 2013-03-19 DIAGNOSIS — I739 Peripheral vascular disease, unspecified: Secondary | ICD-10-CM | POA: Diagnosis present

## 2013-03-19 DIAGNOSIS — J069 Acute upper respiratory infection, unspecified: Secondary | ICD-10-CM

## 2013-03-19 DIAGNOSIS — C4492 Squamous cell carcinoma of skin, unspecified: Secondary | ICD-10-CM

## 2013-03-19 DIAGNOSIS — I050 Rheumatic mitral stenosis: Secondary | ICD-10-CM

## 2013-03-19 DIAGNOSIS — R05 Cough: Secondary | ICD-10-CM

## 2013-03-19 DIAGNOSIS — Z6833 Body mass index (BMI) 33.0-33.9, adult: Secondary | ICD-10-CM

## 2013-03-19 HISTORY — DX: Shortness of breath: R06.02

## 2013-03-19 LAB — GLUCOSE, CAPILLARY
Glucose-Capillary: 185 mg/dL — ABNORMAL HIGH (ref 70–99)
Glucose-Capillary: 160 mg/dL — ABNORMAL HIGH (ref 70–99)

## 2013-03-19 LAB — CBC WITH DIFFERENTIAL/PLATELET
Basophils Absolute: 0 10*3/uL (ref 0.0–0.1)
Basophils Relative: 1 % (ref 0–1)
Eosinophils Absolute: 0.2 10*3/uL (ref 0.0–0.7)
Eosinophils Relative: 2 % (ref 0–5)
HEMATOCRIT: 38 % — AB (ref 39.0–52.0)
HEMOGLOBIN: 12.6 g/dL — AB (ref 13.0–17.0)
LYMPHS PCT: 13 % (ref 12–46)
Lymphs Abs: 1 10*3/uL (ref 0.7–4.0)
MCH: 29.4 pg (ref 26.0–34.0)
MCHC: 33.2 g/dL (ref 30.0–36.0)
MCV: 88.8 fL (ref 78.0–100.0)
MONO ABS: 0.7 10*3/uL (ref 0.1–1.0)
MONOS PCT: 9 % (ref 3–12)
NEUTROS ABS: 5.8 10*3/uL (ref 1.7–7.7)
Neutrophils Relative %: 75 % (ref 43–77)
Platelets: 206 10*3/uL (ref 150–400)
RBC: 4.28 MIL/uL (ref 4.22–5.81)
RDW: 18.2 % — ABNORMAL HIGH (ref 11.5–15.5)
WBC: 7.7 10*3/uL (ref 4.0–10.5)

## 2013-03-19 LAB — COMPREHENSIVE METABOLIC PANEL
ALK PHOS: 35 U/L — AB (ref 39–117)
ALT: 24 U/L (ref 0–53)
AST: 23 U/L (ref 0–37)
Albumin: 4.2 g/dL (ref 3.5–5.2)
BUN: 33 mg/dL — AB (ref 6–23)
CHLORIDE: 99 meq/L (ref 96–112)
CO2: 23 meq/L (ref 19–32)
CREATININE: 1.91 mg/dL — AB (ref 0.50–1.35)
Calcium: 10 mg/dL (ref 8.4–10.5)
GFR calc Af Amer: 39 mL/min — ABNORMAL LOW (ref >90)
GFR, EST NON AFRICAN AMERICAN: 34 mL/min — AB (ref >90)
Glucose, Bld: 144 mg/dL — ABNORMAL HIGH (ref 70–99)
POTASSIUM: 4.7 meq/L (ref 3.7–5.3)
Sodium: 140 mEq/L (ref 137–147)
Total Bilirubin: 0.3 mg/dL (ref 0.3–1.2)
Total Protein: 8 g/dL (ref 6.0–8.3)

## 2013-03-19 LAB — PRO B NATRIURETIC PEPTIDE: Pro B Natriuretic peptide (BNP): 7196 pg/mL — ABNORMAL HIGH (ref 0–125)

## 2013-03-19 LAB — PROTIME-INR
INR: 1.09 (ref 0.00–1.49)
PROTHROMBIN TIME: 13.9 s (ref 11.6–15.2)

## 2013-03-19 LAB — HEMOGLOBIN A1C
HEMOGLOBIN A1C: 7.8 % — AB (ref <5.7)
Mean Plasma Glucose: 177 mg/dL — ABNORMAL HIGH (ref <117)

## 2013-03-19 MED ORDER — POTASSIUM CHLORIDE CRYS ER 20 MEQ PO TBCR
20.0000 meq | EXTENDED_RELEASE_TABLET | Freq: Every day | ORAL | Status: DC
  Administered 2013-03-20 – 2013-03-21 (×2): 20 meq via ORAL
  Filled 2013-03-19 (×2): qty 1

## 2013-03-19 MED ORDER — LINAGLIPTIN 5 MG PO TABS
5.0000 mg | ORAL_TABLET | Freq: Every day | ORAL | Status: DC
  Administered 2013-03-19 – 2013-03-21 (×2): 5 mg via ORAL
  Filled 2013-03-19 (×3): qty 1

## 2013-03-19 MED ORDER — RAMIPRIL 10 MG PO CAPS
10.0000 mg | ORAL_CAPSULE | Freq: Every day | ORAL | Status: DC
  Filled 2013-03-19 (×4): qty 1

## 2013-03-19 MED ORDER — COENZYME Q10 300 MG PO CAPS: 300.0000 mg | ORAL_CAPSULE | Freq: Every day | ORAL | Status: DC

## 2013-03-19 MED ORDER — INSULIN GLARGINE 100 UNIT/ML ~~LOC~~ SOLN
50.0000 [IU] | Freq: Once | SUBCUTANEOUS | Status: AC
  Administered 2013-03-19: 50 [IU] via SUBCUTANEOUS

## 2013-03-19 MED ORDER — SODIUM CHLORIDE 0.9 % IJ SOLN: 3.0000 mL | INTRAMUSCULAR | Status: DC | PRN

## 2013-03-19 MED ORDER — SERTRALINE HCL 50 MG PO TABS
50.0000 mg | ORAL_TABLET | Freq: Every morning | ORAL | Status: DC
  Administered 2013-03-20 – 2013-03-21 (×2): 50 mg via ORAL
  Filled 2013-03-19 (×2): qty 1

## 2013-03-19 MED ORDER — ATORVASTATIN CALCIUM 40 MG PO TABS
40.0000 mg | ORAL_TABLET | Freq: Every day | ORAL | Status: DC
  Administered 2013-03-19 – 2013-03-21 (×3): 40 mg via ORAL
  Filled 2013-03-19 (×3): qty 1

## 2013-03-19 MED ORDER — OMEGA-3-ACID ETHYL ESTERS 1 G PO CAPS
1.0000 g | ORAL_CAPSULE | Freq: Two times a day (BID) | ORAL | Status: DC
  Administered 2013-03-19 – 2013-03-21 (×4): 1 g via ORAL
  Filled 2013-03-19 (×5): qty 1

## 2013-03-19 MED ORDER — SODIUM CHLORIDE 0.9 % IJ SOLN: 3.0000 mL | Freq: Two times a day (BID) | INTRAMUSCULAR | Status: DC

## 2013-03-19 MED ORDER — VITAMIN D (ERGOCALCIFEROL) 1.25 MG (50000 UNIT) PO CAPS
50000.0000 [IU] | ORAL_CAPSULE | ORAL | Status: DC
  Administered 2013-03-21: 50000 [IU] via ORAL
  Filled 2013-03-19: qty 1

## 2013-03-19 MED ORDER — FENOFIBRATE 54 MG PO TABS
54.0000 mg | ORAL_TABLET | Freq: Every morning | ORAL | Status: DC
  Administered 2013-03-19 – 2013-03-21 (×2): 54 mg via ORAL
  Filled 2013-03-19 (×3): qty 1

## 2013-03-19 MED ORDER — NITROGLYCERIN 0.4 MG SL SUBL: 0.4000 mg | SUBLINGUAL_TABLET | SUBLINGUAL | Status: DC | PRN

## 2013-03-19 MED ORDER — ONDANSETRON HCL 4 MG/2ML IJ SOLN: 4.0000 mg | Freq: Four times a day (QID) | INTRAMUSCULAR | Status: DC | PRN

## 2013-03-19 MED ORDER — SODIUM CHLORIDE 0.9 % IV SOLN: 250.0000 mL | INTRAVENOUS | Status: DC | PRN

## 2013-03-19 MED ORDER — ENOXAPARIN SODIUM 40 MG/0.4ML ~~LOC~~ SOLN
40.0000 mg | SUBCUTANEOUS | Status: DC
  Administered 2013-03-19: 40 mg via SUBCUTANEOUS
  Filled 2013-03-19 (×3): qty 0.4

## 2013-03-19 MED ORDER — EZETIMIBE 10 MG PO TABS
10.0000 mg | ORAL_TABLET | Freq: Every evening | ORAL | Status: DC
  Administered 2013-03-19 – 2013-03-21 (×3): 10 mg via ORAL
  Filled 2013-03-19 (×3): qty 1

## 2013-03-19 MED ORDER — FAMOTIDINE 20 MG PO TABS
20.0000 mg | ORAL_TABLET | Freq: Every day | ORAL | Status: DC
  Administered 2013-03-20 – 2013-03-21 (×2): 20 mg via ORAL
  Filled 2013-03-19 (×2): qty 1

## 2013-03-19 MED ORDER — INSULIN ASPART 100 UNIT/ML ~~LOC~~ SOLN
0.0000 [IU] | Freq: Three times a day (TID) | SUBCUTANEOUS | Status: DC
  Administered 2013-03-19 – 2013-03-21 (×2): 3 [IU] via SUBCUTANEOUS
  Administered 2013-03-21 (×2): 5 [IU] via SUBCUTANEOUS

## 2013-03-19 MED ORDER — REPAGLINIDE 2 MG PO TABS
2.0000 mg | ORAL_TABLET | Freq: Two times a day (BID) | ORAL | Status: DC
  Administered 2013-03-19 – 2013-03-21 (×3): 2 mg via ORAL
  Filled 2013-03-19 (×6): qty 1

## 2013-03-19 MED ORDER — INSULIN GLARGINE 100 UNIT/ML ~~LOC~~ SOLN
70.0000 [IU] | Freq: Every day | SUBCUTANEOUS | Status: DC
  Administered 2013-03-20: 70 [IU] via SUBCUTANEOUS
  Filled 2013-03-19 (×3): qty 0.7

## 2013-03-19 MED ORDER — ASPIRIN EC 81 MG PO TBEC: 81.0000 mg | DELAYED_RELEASE_TABLET | Freq: Every day | ORAL | Status: DC

## 2013-03-19 MED ORDER — ASPIRIN EC 81 MG PO TBEC
81.0000 mg | DELAYED_RELEASE_TABLET | Freq: Every day | ORAL | Status: DC
  Administered 2013-03-19 – 2013-03-21 (×3): 81 mg via ORAL
  Filled 2013-03-19 (×3): qty 1

## 2013-03-19 MED ORDER — PANTOPRAZOLE SODIUM 40 MG PO TBEC
40.0000 mg | DELAYED_RELEASE_TABLET | Freq: Every morning | ORAL | Status: DC
  Administered 2013-03-21: 40 mg via ORAL

## 2013-03-19 MED ORDER — ACETAMINOPHEN 325 MG PO TABS: 650.0000 mg | ORAL_TABLET | ORAL | Status: DC | PRN

## 2013-03-19 MED ORDER — SODIUM CHLORIDE 0.9 % IV SOLN
INTRAVENOUS | Status: DC
  Administered 2013-03-19: 10 mL via INTRAVENOUS

## 2013-03-19 MED ORDER — FUROSEMIDE 80 MG PO TABS
80.0000 mg | ORAL_TABLET | Freq: Two times a day (BID) | ORAL | Status: DC
  Administered 2013-03-19 – 2013-03-20 (×2): 80 mg via ORAL
  Filled 2013-03-19 (×5): qty 1

## 2013-03-19 MED ORDER — DIAZEPAM 5 MG PO TABS: 5.0000 mg | ORAL_TABLET | ORAL | Status: AC

## 2013-03-19 MED ORDER — METHOCARBAMOL 500 MG PO TABS
500.0000 mg | ORAL_TABLET | Freq: Every day | ORAL | Status: DC | PRN
  Administered 2013-03-19 – 2013-03-20 (×2): 500 mg via ORAL
  Filled 2013-03-19 (×5): qty 1

## 2013-03-19 MED ORDER — METOPROLOL TARTRATE 25 MG PO TABS
25.0000 mg | ORAL_TABLET | Freq: Two times a day (BID) | ORAL | Status: DC
  Administered 2013-03-19 – 2013-03-20 (×2): 25 mg via ORAL
  Filled 2013-03-19 (×6): qty 1

## 2013-03-19 MED ORDER — SODIUM CHLORIDE 0.9 % IJ SOLN
3.0000 mL | Freq: Two times a day (BID) | INTRAMUSCULAR | Status: DC
  Administered 2013-03-19 – 2013-03-20 (×2): 3 mL via INTRAVENOUS

## 2013-03-19 NOTE — Progress Notes (Signed)
Utilization Review Completed.[name] [name] T1/15/2015  

## 2013-03-19 NOTE — Progress Notes (Signed)
PHARMACIST - PHYSICIAN ORDER COMMUNICATION  CONCERNING: P&T Medication Policy on Herbal Medications  DESCRIPTION:  This patient's order for:  Coenzyme Q 10  has been noted.  This product(s) is classified as an "herbal" or natural product. Due to a lack of definitive safety studies or FDA approval, nonstandard manufacturing practices, plus the potential risk of unknown drug-drug interactions while on inpatient medications, the Pharmacy and Therapeutics Committee does not permit the use of "herbal" or natural products of this type within [facility].   ACTION TAKEN: The pharmacy department is unable to verify this order at this time and your patient has been informed of this safety policy. Please reevaluate patient's clinical condition at discharge and address if the herbal or natural product(s) should be resumed at that time.  Norva Riffle

## 2013-03-20 ENCOUNTER — Encounter (HOSPITAL_COMMUNITY)
Admission: RE | Disposition: A | Discharge status: Home / Self Care | Payer: Self-pay | Source: Ambulatory Visit | Attending: Cardiovascular Disease

## 2013-03-20 ENCOUNTER — Inpatient Hospital Stay (HOSPITAL_COMMUNITY): Payer: Medicare Other

## 2013-03-20 ENCOUNTER — Ambulatory Visit (HOSPITAL_COMMUNITY): Admission: RE | Admit: 2013-03-20 | Payer: Medicare Other | Source: Ambulatory Visit | Admitting: Cardiovascular Disease

## 2013-03-20 DIAGNOSIS — I5033 Acute on chronic diastolic (congestive) heart failure: Principal | ICD-10-CM

## 2013-03-20 DIAGNOSIS — I2510 Atherosclerotic heart disease of native coronary artery without angina pectoris: Secondary | ICD-10-CM

## 2013-03-20 HISTORY — PX: LEFT AND RIGHT HEART CATHETERIZATION WITH CORONARY ANGIOGRAM: SHX5449

## 2013-03-20 LAB — POCT I-STAT 3, VENOUS BLOOD GAS (G3P V)
Acid-base deficit: 1 mmol/L (ref 0.0–2.0)
Bicarbonate: 25.9 mEq/L — ABNORMAL HIGH (ref 20.0–24.0)
Bicarbonate: 26 mEq/L — ABNORMAL HIGH (ref 20.0–24.0)
O2 SAT: 52 %
O2 Saturation: 61 %
PCO2 VEN: 49.4 mmHg (ref 45.0–50.0)
PH VEN: 7.327 — AB (ref 7.250–7.300)
PO2 VEN: 30 mmHg (ref 30.0–45.0)
PO2 VEN: 34 mmHg (ref 30.0–45.0)
TCO2: 27 mmol/L (ref 0–100)
TCO2: 27 mmol/L (ref 0–100)
pCO2, Ven: 47.5 mmHg (ref 45.0–50.0)
pH, Ven: 7.346 — ABNORMAL HIGH (ref 7.250–7.300)

## 2013-03-20 LAB — GLUCOSE, CAPILLARY
GLUCOSE-CAPILLARY: 97 mg/dL (ref 70–99)
GLUCOSE-CAPILLARY: 98 mg/dL (ref 70–99)
Glucose-Capillary: 79 mg/dL (ref 70–99)
Glucose-Capillary: 237 mg/dL — ABNORMAL HIGH (ref 70–99)

## 2013-03-20 LAB — BASIC METABOLIC PANEL
BUN: 34 mg/dL — AB (ref 6–23)
CALCIUM: 9.6 mg/dL (ref 8.4–10.5)
CO2: 27 meq/L (ref 19–32)
Chloride: 101 mEq/L (ref 96–112)
Creatinine, Ser: 1.92 mg/dL — ABNORMAL HIGH (ref 0.50–1.35)
GFR calc Af Amer: 39 mL/min — ABNORMAL LOW (ref >90)
GFR, EST NON AFRICAN AMERICAN: 34 mL/min — AB (ref >90)
Glucose, Bld: 87 mg/dL (ref 70–99)
Potassium: 4.2 mEq/L (ref 3.7–5.3)
SODIUM: 143 meq/L (ref 137–147)

## 2013-03-20 LAB — POCT I-STAT 3, ART BLOOD GAS (G3+)
Acid-base deficit: 1 mmol/L (ref 0.0–2.0)
Bicarbonate: 23.2 mEq/L (ref 20.0–24.0)
O2 Saturation: 91 %
PCO2 ART: 36 mmHg (ref 35.0–45.0)
PO2 ART: 59 mmHg — AB (ref 80.0–100.0)
TCO2: 24 mmol/L (ref 0–100)
pH, Arterial: 7.417 (ref 7.350–7.450)

## 2013-03-20 SURGERY — LEFT AND RIGHT HEART CATHETERIZATION WITH CORONARY ANGIOGRAM
Anesthesia: LOCAL

## 2013-03-20 MED ORDER — MORPHINE SULFATE 2 MG/ML IJ SOLN: 2.0000 mg | INTRAMUSCULAR | Status: DC | PRN

## 2013-03-20 MED ORDER — MIDAZOLAM HCL 2 MG/2ML IJ SOLN
INTRAMUSCULAR | Status: AC
  Filled 2013-03-20: qty 2

## 2013-03-20 MED ORDER — SODIUM CHLORIDE 0.9 % IJ SOLN: 3.0000 mL | Freq: Two times a day (BID) | INTRAMUSCULAR | Status: DC

## 2013-03-20 MED ORDER — OXYCODONE-ACETAMINOPHEN 5-325 MG PO TABS
1.0000 | ORAL_TABLET | ORAL | Status: DC | PRN
  Administered 2013-03-20 – 2013-03-21 (×2): 1 via ORAL
  Filled 2013-03-20 (×2): qty 1

## 2013-03-20 MED ORDER — SODIUM CHLORIDE 0.9 % IV SOLN
INTRAVENOUS | Status: DC
  Administered 2013-03-20: 08:00:00 via INTRAVENOUS

## 2013-03-20 MED ORDER — NITROGLYCERIN 0.2 MG/ML ON CALL CATH LAB
INTRAVENOUS | Status: AC
  Filled 2013-03-20: qty 1

## 2013-03-20 MED ORDER — ASPIRIN 81 MG PO CHEW
CHEWABLE_TABLET | ORAL | Status: AC
  Filled 2013-03-20: qty 1

## 2013-03-20 MED ORDER — HYDROMORPHONE HCL PF 2 MG/ML IJ SOLN
INTRAMUSCULAR | Status: AC
  Filled 2013-03-20: qty 1

## 2013-03-20 MED ORDER — FENTANYL CITRATE 0.05 MG/ML IJ SOLN
INTRAMUSCULAR | Status: AC
  Filled 2013-03-20: qty 2

## 2013-03-20 MED ORDER — SODIUM CHLORIDE 0.9 % IJ SOLN: 3.0000 mL | INTRAMUSCULAR | Status: DC | PRN

## 2013-03-20 MED ORDER — FUROSEMIDE 80 MG PO TABS
80.0000 mg | ORAL_TABLET | Freq: Two times a day (BID) | ORAL | Status: DC
  Administered 2013-03-20 – 2013-03-21 (×2): 80 mg via ORAL
  Filled 2013-03-20 (×4): qty 1

## 2013-03-20 MED ORDER — SODIUM CHLORIDE 0.9 % IV SOLN
1.0000 mL/kg/h | INTRAVENOUS | Status: AC
  Administered 2013-03-20: 1 mL/kg/h via INTRAVENOUS

## 2013-03-20 MED ORDER — LIDOCAINE HCL (PF) 1 % IJ SOLN
INTRAMUSCULAR | Status: AC
  Filled 2013-03-20: qty 30

## 2013-03-20 MED ORDER — SODIUM CHLORIDE 0.9 % IV SOLN: 250.0000 mL | INTRAVENOUS | Status: DC | PRN

## 2013-03-20 MED ORDER — HEPARIN (PORCINE) IN NACL 2-0.9 UNIT/ML-% IJ SOLN
INTRAMUSCULAR | Status: AC
  Filled 2013-03-20: qty 1000

## 2013-03-20 NOTE — H&P (View-Only) (Signed)
  Subjective: Denies CP. Breathing is better. He has mostly been bed bound, so cannot rule out further presyncope.   Objective: Vital signs in last 24 hours: Temp:  [97.4 F (36.3 C)-97.9 F (36.6 C)] 97.9 F (36.6 C) (01/16 0527) Pulse Rate:  [51-62] 51 (01/16 0527) Resp:  [18-20] 20 (01/16 0527) BP: (94-129)/(52-64) 94/52 mmHg (01/16 0527) SpO2:  [95 %-99 %] 99 % (01/16 0527) Weight:  [233 lb 12.8 oz (106.051 kg)-238 lb 5.1 oz (108.1 kg)] 233 lb 12.8 oz (106.051 kg) (01/16 0527) Last BM Date: 03/19/13  Intake/Output from previous day: 01/15 0701 - 01/16 0700 In: 480 [P.O.:480] Out: 450 [Urine:450] Intake/Output this shift:    Medications Current Facility-Administered Medications  Medication Dose Route Frequency Provider Last Rate Last Dose  . 0.9 %  sodium chloride infusion  250 mL Intravenous PRN Dorella Laster, MD      . 0.9 %  sodium chloride infusion   Intravenous Continuous Alvaro Aungst, MD      . acetaminophen (TYLENOL) tablet 650 mg  650 mg Oral Q4H PRN Dajha Urquilla, MD      . aspirin EC tablet 81 mg  81 mg Oral Daily Terik Haughey, MD   81 mg at 03/19/13 2119  . atorvastatin (LIPITOR) tablet 40 mg  40 mg Oral q1800 Keshon Markovitz, MD   40 mg at 03/19/13 1730  . diazepam (VALIUM) tablet 5 mg  5 mg Oral On Call Alonnah Lampkins, MD      . enoxaparin (LOVENOX) injection 40 mg  40 mg Subcutaneous Q24H Arnel Wymer, MD   40 mg at 03/19/13 1731  . ezetimibe (ZETIA) tablet 10 mg  10 mg Oral QPM Anjel Perfetti, MD   10 mg at 03/19/13 1730  . famotidine (PEPCID) tablet 20 mg  20 mg Oral Daily Maeci Kalbfleisch, MD      . fenofibrate tablet 54 mg  54 mg Oral q morning - 10a Dayla Gasca, MD   54 mg at 03/19/13 1730  . furosemide (LASIX) tablet 80 mg  80 mg Oral BID Kalleigh Harbor, MD   80 mg at 03/19/13 1730  . insulin aspart (novoLOG) injection 0-15 Units  0-15 Units Subcutaneous TID WC Evans Levee, MD   3 Units at 03/19/13 1730  . insulin glargine (LANTUS) injection  70 Units  70 Units Subcutaneous QHS Tamisha Nordstrom, MD      . linagliptin (TRADJENTA) tablet 5 mg  5 mg Oral Daily Emeree Mahler, MD   5 mg at 03/19/13 1730  . methocarbamol (ROBAXIN) tablet 500 mg  500 mg Oral Daily PRN Copper Kirtley, MD   500 mg at 03/19/13 2331  . metoprolol tartrate (LOPRESSOR) tablet 25 mg  25 mg Oral Q12H Marajade Lei, MD   25 mg at 03/19/13 2119  . nitroGLYCERIN (NITROSTAT) SL tablet 0.4 mg  0.4 mg Sublingual Q5 min PRN Violetta Lavalle, MD      . omega-3 acid ethyl esters (LOVAZA) capsule 1 g  1 g Oral BID Rhona Fusilier, MD   1 g at 03/19/13 2119  . ondansetron (ZOFRAN) injection 4 mg  4 mg Intravenous Q6H PRN Tiffine Henigan, MD      . pantoprazole (PROTONIX) EC tablet 40 mg  40 mg Oral q morning - 10a Zaharah Amir, MD      . potassium chloride SA (K-DUR,KLOR-CON) CR tablet 20 mEq  20 mEq Oral Daily Bosten Newstrom, MD      . ramipril (ALTACE) capsule 10 mg  10 mg   Oral QHS [name], MD      . repaglinide [facility]) tablet 2 mg  2 mg Oral BID [facility] [name], MD   2 mg at 03/19/13 1730  . sertraline (ZOLOFT) tablet 50 mg  50 mg Oral q morning - 10a [name], MD      . sodium chloride 0.9 % injection 3 mL  3 mL Intravenous PRN [name], MD      . sodium chloride 0.9 % injection 3 mL  3 mL Intravenous Q12H [name], MD   3 mL at 03/19/13 2120  . [START ON 03/21/2013] Vitamin D (Ergocalciferol) (DRISDOL) capsule 50,000 Units  50,000 Units Oral Q7 days [name], MD        PE: General appearance: alert, cooperative and no distress Lungs: clear to auscultation bilaterally Heart: regular rate and rhythm and 3/6 AS murmur Extremities: venous stasis dermatitis noted and trace bilateral LEE Pulses: 2+ and symmetric Skin: Skin color, texture, turgor normal. No rashes or lesions Neurologic: Grossly normal  Lab Results:   Recent Labs  03/19/13 1250  WBC 7.7  HGB 12.6*  HCT 38.0*  PLT 206   BMET  Recent Labs  03/19/13 1250  03/20/13 0347  NA 140 143  K 4.7 4.2  CL 99 101  CO2 23 27  GLUCOSE 144* 87  BUN 33* 34*  CREATININE 1.91* 1.92*  CALCIUM 10.0 9.6   PT/INR  Recent Labs  03/19/13 1250  LABPROT 13.9  INR 1.09    Assessment/Plan  Active Problems:   Acute on chronic diastolic heart failure  Plan: Breathing better. No chest pain. Borderline hypotensive w/ SBP in the low 90s, but asymptomatic. Plan for LHC with Dr. [name] today. ? Need for AVR.    LOS: 1 day    Brittainy M. Ladoris Gene 03/20/2013 8:05 AM  Patient seen, examined. Available data reviewed. Agree with findings, assessment, and plan as outlined by Lyda Jester, PA-C. Mr Mclennan is stable, but creatinine is above baseline. Suspect secondary to hypotension/ACE-inhibitor. Will hold lasix this am as well. Give IV fluids x 6 hours and plan cath this pm with limited contrast. Reviewed risks, indication, and alternatives to this approach and he understands and agrees to proceed. CXR/labs reviewed - no clear CHF on chest XRay. Suspect progressive symptoms related to rheumatic valvular heart disease (AS/MS).  [name], M.D. 03/20/2013 8:54 AM

## 2013-03-20 NOTE — Progress Notes (Signed)
Reviewed case with Dr Roxy Manns. If patient remains stable and renal function ok, he can be discharged tomorrow after an echocardiogram. Would like to repeat his echo because he now has hypotension (rule out development of LV dysfunction). Dr Roxy Manns will see him back in the office one week after discharge.   He should stay off of plavix and ramipril at discharge. Would continue lasix 80 mg BID.   [name] 03/20/2013 5:47 PM

## 2013-03-20 NOTE — Interval H&P Note (Signed)
History and Physical Interval Note:  03/20/2013 2:00 PM  [name] [name].  has presented today for surgery, with the diagnosis of Chest pain  The various methods of treatment have been discussed with the patient and family. After consideration of risks, benefits and other options for treatment, the patient has consented to  Procedure(s): LEFT AND RIGHT HEART CATHETERIZATION WITH CORONARY ANGIOGRAM (N/A) as a surgical intervention .  The patient's history has been reviewed, patient examined, no change in status, stable for surgery.  I have reviewed the patient's chart and labs.  Questions were answered to the patient's satisfaction.    Cath Lab Visit (complete for each Cath Lab visit)  Clinical Evaluation Leading to the Procedure:   ACS: no  Non-ACS:    Anginal Classification: No Symptoms  Anti-ischemic medical therapy: Maximal Therapy (2 or more classes of medications)  Non-Invasive Test Results: No non-invasive testing performed  Prior CABG: No previous CABG       [name]

## 2013-03-20 NOTE — Progress Notes (Signed)
Subjective: Denies CP. Breathing is better. He has mostly been bed bound, so cannot rule out further presyncope.   Objective: Vital signs in last 24 hours: Temp:  [97.4 F (36.3 C)-97.9 F (36.6 C)] 97.9 F (36.6 C) (01/16 0527) Pulse Rate:  [51-62] 51 (01/16 0527) Resp:  [18-20] 20 (01/16 0527) BP: (94-129)/(52-64) 94/52 mmHg (01/16 0527) SpO2:  [95 %-99 %] 99 % (01/16 0527) Weight:  [233 lb 12.8 oz (106.051 kg)-238 lb 5.1 oz (108.1 kg)] 233 lb 12.8 oz (106.051 kg) (01/16 0527) Last BM Date: 03/19/13  Intake/Output from previous day: 01/15 0701 - 01/16 0700 In: 480 [P.O.:480] Out: 450 [Urine:450] Intake/Output this shift:    Medications Current Facility-Administered Medications  Medication Dose Route Frequency Provider Last Rate Last Dose  . 0.9 %  sodium chloride infusion  250 mL Intravenous PRN [name], MD      . 0.9 %  sodium chloride infusion   Intravenous Continuous [name], MD      . acetaminophen (TYLENOL) tablet 650 mg  650 mg Oral Q4H PRN [name], MD      . aspirin EC tablet 81 mg  81 mg Oral Daily [name], MD   81 mg at 03/19/13 2119  . atorvastatin (LIPITOR) tablet 40 mg  40 mg Oral q1800 [name], MD   40 mg at 03/19/13 1730  . diazepam (VALIUM) tablet 5 mg  5 mg Oral On Call [name], MD      . enoxaparin (LOVENOX) injection 40 mg  40 mg Subcutaneous Q24H [name], MD   40 mg at 03/19/13 1731  . ezetimibe (ZETIA) tablet 10 mg  10 mg Oral QPM [name], MD   10 mg at 03/19/13 1730  . famotidine (PEPCID) tablet 20 mg  20 mg Oral Daily [name], MD      . fenofibrate tablet 54 mg  54 mg Oral q morning - 10a [name], MD   54 mg at 03/19/13 1730  . furosemide (LASIX) tablet 80 mg  80 mg Oral BID [name], MD   80 mg at 03/19/13 1730  . insulin aspart (novoLOG) injection 0-15 Units  0-15 Units Subcutaneous TID WC [name], MD   3 Units at 03/19/13 1730  . insulin glargine (LANTUS) injection  70 Units  70 Units Subcutaneous QHS [name], MD      . linagliptin (TRADJENTA) tablet 5 mg  5 mg Oral Daily [name], MD   5 mg at 03/19/13 1730  . methocarbamol (ROBAXIN) tablet 500 mg  500 mg Oral Daily PRN [name], MD   500 mg at 03/19/13 2331  . metoprolol tartrate (LOPRESSOR) tablet 25 mg  25 mg Oral Q12H [name], MD   25 mg at 03/19/13 2119  . nitroGLYCERIN (NITROSTAT) SL tablet 0.4 mg  0.4 mg Sublingual Q5 min PRN [name], MD      . omega-3 acid ethyl esters (LOVAZA) capsule 1 g  1 g Oral BID [name], MD   1 g at 03/19/13 2119  . ondansetron (ZOFRAN) injection 4 mg  4 mg Intravenous Q6H PRN [name], MD      . pantoprazole (PROTONIX) EC tablet 40 mg  40 mg Oral q morning - 10a [name], MD      . potassium chloride SA (K-DUR,KLOR-CON) CR tablet 20 mEq  20 mEq Oral Daily [name], MD      . ramipril (ALTACE) capsule 10 mg  10 mg  Oral QHS [name], MD      . repaglinide [facility]) tablet 2 mg  2 mg Oral BID [facility] [name], MD   2 mg at 03/19/13 1730  . sertraline (ZOLOFT) tablet 50 mg  50 mg Oral q morning - 10a [name], MD      . sodium chloride 0.9 % injection 3 mL  3 mL Intravenous PRN [name], MD      . sodium chloride 0.9 % injection 3 mL  3 mL Intravenous Q12H [name], MD   3 mL at 03/19/13 2120  . [START ON 03/21/2013] Vitamin D (Ergocalciferol) (DRISDOL) capsule 50,000 Units  50,000 Units Oral Q7 days [name], MD        PE: General appearance: alert, cooperative and no distress Lungs: clear to auscultation bilaterally Heart: regular rate and rhythm and 3/6 AS murmur Extremities: venous stasis dermatitis noted and trace bilateral LEE Pulses: 2+ and symmetric Skin: Skin color, texture, turgor normal. No rashes or lesions Neurologic: Grossly normal  Lab Results:   Recent Labs  03/19/13 1250  WBC 7.7  HGB 12.6*  HCT 38.0*  PLT 206   BMET  Recent Labs  03/19/13 1250  03/20/13 0347  NA 140 143  K 4.7 4.2  CL 99 101  CO2 23 27  GLUCOSE 144* 87  BUN 33* 34*  CREATININE 1.91* 1.92*  CALCIUM 10.0 9.6   PT/INR  Recent Labs  03/19/13 1250  LABPROT 13.9  INR 1.09    Assessment/Plan  Active Problems:   Acute on chronic diastolic heart failure  Plan: Breathing better. No chest pain. Borderline hypotensive w/ SBP in the low 90s, but asymptomatic. Plan for LHC with Dr. [name] today. ? Need for AVR.    LOS: 1 day    Brittainy M. Ladoris Gene 03/20/2013 8:05 AM  Patient seen, examined. Available data reviewed. Agree with findings, assessment, and plan as outlined by Lyda Jester, PA-C. Mr Mclennan is stable, but creatinine is above baseline. Suspect secondary to hypotension/ACE-inhibitor. Will hold lasix this am as well. Give IV fluids x 6 hours and plan cath this pm with limited contrast. Reviewed risks, indication, and alternatives to this approach and he understands and agrees to proceed. CXR/labs reviewed - no clear CHF on chest XRay. Suspect progressive symptoms related to rheumatic valvular heart disease (AS/MS).  [name], M.D. 03/20/2013 8:54 AM

## 2013-03-20 NOTE — CV Procedure (Signed)
Cardiac Catheterization Procedure Note  Name: [name] [name]. MRN: [id number] DOB: [birth year]  Procedure: Right Heart Cath, Left Heart Cath, Selective Coronary Angiography, LIMA angiography, SVG angiography  Indication: CHF, Rheumatic aortic and mitral stenosis   Procedural Details: The right groin was prepped, draped, and anesthetized with 1% lidocaine. Using the modified Seldinger technique a 5 French sheath was placed in the right femoral artery and a 7 French sheath was placed in the right femoral vein. A Swan-Ganz catheter was used for the right heart catheterization. Standard protocol was followed for recording of right heart pressures and sampling of oxygen saturations. Fick cardiac output was calculated. Standard Judkins catheters were used for selective coronary angiography. An AL-1 catheter was used to direct an exchange length straight tip wire across the aortic valve and this was changed out for a pigtail catheter. Simultaneous LV and pulmonary capillary wedge pressures were recorded to evaluate mitral stenosis. A pullback across the aortic valve was done to evaluate aortic stenosis. Ventriculography was deferred because of chronic kidney disease. Saphenous vein graft angiography was performed with the JR 4 catheter. LIMA angiography was performed of the LIMA catheter. There were no immediate procedural complications. The patient was transferred to the post catheterization recovery area for further monitoring.  Procedural Findings: Hemodynamics RA A wave 19, V wave 17, mean 14 RV 96/18 PA 98/34 with a mean of 56 PCWP A wave 28, V wave 31, mean 27 LV 167/23 AO 118/61 with a mean of 84  Oxygen saturations: PA 56 AO 91  Cardiac Output (Fick) 5.0 L per min  Cardiac Index (Fick) 2.2 L per minute per meter squared  Aortic valve hemodynamics: The peak gradient 49 mm mercury, mean gradient 37 mm mercury, aortic valve area 1.0 cm.  Mitral valve hemodynamics: Mean gradient 11,  mitral valve area 1.16 cm  Coronary angiography: Coronary dominance: right  Left mainstem: The left main is severely calcified. The vessel has 90% distal stenosis.  Left anterior descending (LAD): The LAD is occluded at the first septal perforator. The entirety of the LAD is severely calcified always to the LV apex.  Left circumflex (LCx): The left circumflex is totally occluded. There is a sequential vein graft supplying the first and second OM branches with continued patency  Right coronary artery (RCA): The RCA is severely calcified throughout its entirety. The vessel is stented with continued stent patency. There is mild in-stent restenosis in the mid and distal vessel. There is diffuse disease throughout the proximal, mid, and distal RCA, but no more than 40-50% stenosis noted. The PDA and PLA branches have no evidence of high-grade stenosis.  Saphenous vein graft sequential to OM1 and OM 2: Widely patent throughout  LIMA sequential to second diagonal and LAD: The diagonal is patent beyond the LIMA anastomosis with 70% stenosis noted as it divides into twin vessels. The diagonal is small through this region. The sequential limb to the LAD is patent. However, the LAD is totally occluded at the anastomotic site. Previously the vessel was severely diseased throughout.  Left ventriculography: Deferred because of chronic kidney disease  Final Conclusions:   1. Severe aortic stenosis 2. Moderate to severe mitral stenosis 3. 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 Status post aortocoronary bypass surgery with continued patency of the saphenous vein graft sequential to OM1 and OM 2, known occlusion of the saphenous vein graft to  RCA branches, and continued patency of the LIMA sequential to diagonal 2 and the mid LAD. However, total occlusion of the mid  LAD just beyond the LIMA anastomosis. 5. Severe pulmonary hypertension.  [name] 03/20/2013, 2:55 PM

## 2013-03-20 NOTE — Care Management Note (Unsigned)
    Page 1 of 1   03/20/2013     2:00:58 PM   CARE MANAGEMENT NOTE 03/20/2013  Patient:  [name] [name], [name] [name]   Account Number:  [id number]  Date Initiated:  03/20/2013  Documentation initiated by:  Markeshia Giebel  Subjective/Objective Assessment:   PT ADM ON 1/15 WITH CHF.  PTA, PT RESIDES AT [place].     Action/Plan:   WILL FOLLOW FOR DC NEEDS AS PT PROGRESSES.  CATH PENDING TODAY; MAY NEED VALVE REPLACEMENT NEXT WEEK, PER MD NOTES.   Anticipated DC Date:  03/23/2013   Anticipated DC Plan:  [place]  CM consult      Choice offered to / List presented to:             Status of service:  In process, will continue to follow Medicare Important Message given?   (If response is "NO", the following Medicare IM given date fields will be blank) Date Medicare IM given:   Date Additional Medicare IM given:    Discharge Disposition:    Per UR Regulation:  Reviewed for med. necessity/level of care/duration of stay  If discussed at [place] of Stay Meetings, dates discussed:    Comments:

## 2013-03-20 NOTE — Progress Notes (Signed)
Unable to find measuring device to order ted hose.

## 2013-03-21 DIAGNOSIS — I359 Nonrheumatic aortic valve disorder, unspecified: Secondary | ICD-10-CM

## 2013-03-21 LAB — BASIC METABOLIC PANEL
BUN: 28 mg/dL — ABNORMAL HIGH (ref 6–23)
CHLORIDE: 102 meq/L (ref 96–112)
CO2: 25 mEq/L (ref 19–32)
Calcium: 9.1 mg/dL (ref 8.4–10.5)
Creatinine, Ser: 1.75 mg/dL — ABNORMAL HIGH (ref 0.50–1.35)
GFR calc non Af Amer: 38 mL/min — ABNORMAL LOW (ref >90)
GFR, EST AFRICAN AMERICAN: 44 mL/min — AB (ref >90)
Glucose, Bld: 189 mg/dL — ABNORMAL HIGH (ref 70–99)
POTASSIUM: 4.1 meq/L (ref 3.7–5.3)
Sodium: 142 mEq/L (ref 137–147)

## 2013-03-21 LAB — GLUCOSE, CAPILLARY
GLUCOSE-CAPILLARY: 259 mg/dL — AB (ref 70–99)
Glucose-Capillary: 190 mg/dL — ABNORMAL HIGH (ref 70–99)
Glucose-Capillary: 222 mg/dL — ABNORMAL HIGH (ref 70–99)

## 2013-03-21 MED ORDER — METOPROLOL TARTRATE 12.5 MG HALF TABLET
12.5000 mg | ORAL_TABLET | Freq: Two times a day (BID) | ORAL | Status: DC
Start: 2013-03-21 — End: 2013-03-21
  Administered 2013-03-21: 12.5 mg via ORAL
  Filled 2013-03-21 (×2): qty 1

## 2013-03-21 MED ORDER — METOPROLOL TARTRATE 12.5 MG HALF TABLET: 12.5000 mg | ORAL_TABLET | Freq: Two times a day (BID) | ORAL | Status: DC

## 2013-03-21 NOTE — Progress Notes (Signed)
SUBJECTIVE:  He was SOB last night after walking.  No pain.   PHYSICAL EXAM Filed Vitals:   03/20/13 1900 03/20/13 2120 03/20/13 2223 03/21/13 0421  BP: 108/60 102/52 94/45 109/62  Pulse: 52 63 59 55  Temp:   98.6 F (37 C) 97.3 F (36.3 C)  TempSrc:   Oral Oral  Resp:   20 18  Height:      Weight:    234 lb 2.1 oz (106.2 kg)  SpO2:   93% 94%   General:  No distress Lungs:  Clear Heart:  RRR, positive murmur Abdomen:  Positive bowel sounds, no rebound no guarding Extremities:  Right groin without hematoma  LABS: No results found for this basename: TROPONINI   Results for orders placed during the hospital encounter of 03/19/13 (from the past 24 hour(s))  GLUCOSE, CAPILLARY     Status: None   Collection Time    03/20/13 11:19 AM      Result Value Range   Glucose-Capillary 97  70 - 99 mg/dL   Comment 1 Notify RN    POCT I-STAT 3, BLOOD GAS (G3+)     Status: Abnormal   Collection Time    03/20/13  2:24 PM      Result Value Range   pH, Arterial 7.417  7.350 - 7.450   pCO2 arterial 36.0  35.0 - 45.0 mmHg   pO2, Arterial 59.0 (*) 80.0 - 100.0 mmHg   Bicarbonate 23.2  20.0 - 24.0 mEq/L   TCO2 24  0 - 100 mmol/L   O2 Saturation 91.0     Acid-base deficit 1.0  0.0 - 2.0 mmol/L   Sample type ARTERIAL    POCT I-STAT 3, BLOOD GAS (G3P V)     Status: Abnormal   Collection Time    03/20/13  2:49 PM      Result Value Range   pH, Ven 7.327 (*) 7.250 - 7.300   pCO2, Ven 49.4  45.0 - 50.0 mmHg   pO2, Ven 30.0  30.0 - 45.0 mmHg   Bicarbonate 25.9 (*) 20.0 - 24.0 mEq/L   TCO2 27  0 - 100 mmol/L   O2 Saturation 52.0     Acid-base deficit 1.0  0.0 - 2.0 mmol/L   Sample type VENOUS     Comment NOTIFIED PHYSICIAN    POCT I-STAT 3, BLOOD GAS (G3P V)     Status: Abnormal   Collection Time    03/20/13  2:53 PM      Result Value Range   pH, Ven 7.346 (*) 7.250 - 7.300   pCO2, Ven 47.5  45.0 - 50.0 mmHg   pO2, Ven 34.0  30.0 - 45.0 mmHg   Bicarbonate 26.0 (*) 20.0 - 24.0 mEq/L     TCO2 27  0 - 100 mmol/L   O2 Saturation 61.0     Sample type VENOUS     Comment NOTIFIED PHYSICIAN    GLUCOSE, CAPILLARY     Status: None   Collection Time    03/20/13  5:50 PM      Result Value Range   Glucose-Capillary 98  70 - 99 mg/dL  GLUCOSE, CAPILLARY     Status: Abnormal   Collection Time    03/20/13 10:22 PM      Result Value Range   Glucose-Capillary 237 (*) 70 - 99 mg/dL  BASIC METABOLIC PANEL     Status: Abnormal   Collection Time    03/21/13  4:00 AM  Result Value Range   Sodium 142  137 - 147 mEq/L   Potassium 4.1  3.7 - 5.3 mEq/L   Chloride 102  96 - 112 mEq/L   CO2 25  19 - 32 mEq/L   Glucose, Bld 189 (*) 70 - 99 mg/dL   BUN 28 (*) 6 - 23 mg/dL   Creatinine, Ser 1.75 (*) 0.50 - 1.35 mg/dL   Calcium 9.1  8.4 - 10.5 mg/dL   GFR calc non Af Amer 38 (*) >90 mL/min   GFR calc Af Amer 44 (*) >90 mL/min  GLUCOSE, CAPILLARY     Status: Abnormal   Collection Time    03/21/13  6:46 AM      Result Value Range   Glucose-Capillary 190 (*) 70 - 99 mg/dL    Intake/Output Summary (Last 24 hours) at 03/21/13 0855 Last data filed at 03/21/13 0600  Gross per 24 hour  Intake    360 ml  Output   1450 ml  Net  -1090 ml    ASSESSMENT AND PLAN:  CAD:   Medical management.  See cath results  AS:  Awaiting echo today.  Home if OK.  Needs follow up with Dr. Roxy [name] in one week.    CKD:  Creat stable.    BRADYCARDIA:  Reduce beta blocker dose and follow HR as an outpatient.   [name] [name] 03/21/2013 8:55 AM

## 2013-03-21 NOTE — Progress Notes (Signed)
[name] Was 75 when I gave Lopressor at 10 P.M. Last night. When patient asleep [name] Drops to 40's sometimes 38 S.B. 1st degree H.B. When awake back up to 60's . Cont. To monitor patient and rhythm. No comp. Voiced.

## 2013-03-21 NOTE — Progress Notes (Signed)
[address]       [place] [address]             [phone]     CARDIOTHORACIC SURGERY PROGRESS NOTE  1 Day Post-Op  S/P Procedure(s) (LRB): LEFT AND RIGHT HEART CATHETERIZATION WITH CORONARY ANGIOGRAM (N/A)  Subjective: Feels much better now that BP improved.  Objective: Vital signs in last 24 hours: Temp:  [97.3 F (36.3 C)-98.6 F (37 C)] 97.9 F (36.6 C) (01/17 1401) Pulse Rate:  [51-63] 53 (01/17 1401) Cardiac Rhythm:  [-] Normal sinus rhythm;Heart block (01/16 2030) Resp:  [18-20] 18 (01/17 1401) BP: (94-134)/(45-66) 134/66 mmHg (01/17 1401) SpO2:  [93 %-97 %] 97 % (01/17 1401) Weight:  [106.2 kg (234 lb 2.1 oz)] 106.2 kg (234 lb 2.1 oz) (01/17 0421)  Physical Exam:  Rhythm:   sinus  Breath sounds: clear  Heart sounds:  RRR w/ systolic murmur  Incisions:  n/a  Abdomen:  soft  Extremities:  warm   Intake/Output from previous day: 01/16 0701 - 01/17 0700 In: 360 [P.O.:360] Out: 1450 [Urine:1450] Intake/Output this shift: Total I/O In: 480 [P.O.:480] Out: 800 [Urine:800]  Lab Results:  Recent Labs  03/19/13 1250  WBC 7.7  HGB 12.6*  HCT 38.0*  PLT 206   BMET:  Recent Labs  03/20/13 0347 03/21/13 0400  NA 143 142  K 4.2 4.1  CL 101 102  CO2 27 25  GLUCOSE 87 189*  BUN 34* 28*  CREATININE 1.92* 1.75*  CALCIUM 9.6 9.1    CBG (last 3)   Recent Labs  03/20/13 2222 03/21/13 0646 03/21/13 1108  GLUCAP 237* 190* 222*   PT/INR:   Recent Labs  03/19/13 1250  LABPROT 13.9  INR 1.09    CXR:  N/A   Transthoracic Echocardiography  Patient: [name] [name], [name] [name] MR #: [id number] Study Date: 03/21/2013 Gender: M Age: 73 Height: 177.8cm Weight: 106.1kg BSA: 2.44m^2 Pt. Status: Room: [facility]  ADMITTING [name] ATTENDING Catalina [place]ig[place], Gala Lewandowsky SONOGRAPHER Wyatt Mage, RDCS PERFORMING [facility],  Inpatient cc:  ------------------------------------------------------------ LV EF: 35% - 40%  ------------------------------------------------------------ Indications: Aortic stenosis 424.1.  ------------------------------------------------------------ History: PMH: Dyspnea. Coronary artery disease. Congestive heart failure. Aortic stenosis. Mitral stenosis. Risk factors: Hyperpotassemia. PVD. GERD. Hiatal hernia. Gastroenteritis. Cellulitis. Low back pain. Sleep apnea. Leg edema. Carotid bruit. Diarrhea. Appendicitis. Actinic keratosis. Cough. URI. Rheumatic heart disease. Squamous cell carcinoma. Hypertension. Diabetes mellitus. Obese. Dyslipidemia.  ------------------------------------------------------------ Study Conclusions  - Left ventricle: The cavity size was normal. Wall thickness was increased in a pattern of moderate LVH. Systolic function was moderately reduced. The estimated ejection fraction was in the range of 35% to 40%. Diffuse hypokinesis. - Aortic valve: Moderately to severely calcified annulus. Severely thickened leaflets. There was severe stenosis. Valve area: 0.97cm^2(VTI). Valve area: 1.01cm^2 (Vmax). - Mitral valve: The findings are consistent with moderate stenosis. Valve area by pressure half-time: 1.29cm^2. Valve area by continuity equation (using LVOT flow): 0.9cm^2. - Left atrium: The atrium was moderately to severely dilated. - Right ventricle: The cavity size was mildly dilated. Systolic function was mildly reduced. - Right atrium: The atrium was moderately dilated.  ------------------------------------------------------------ Labs, prior tests, procedures, and surgery: Coronary artery bypass grafting.  Transthoracic echocardiography. M-mode, complete 2D, spectral Doppler, and color Doppler. Height: Height: 177.8cm. Height: 70in. Weight: Weight: 106.1kg. Weight: 233.5lb. Body mass index: BMI: 33.6kg/m^2. Body surface area: BSA: 2.81m^2.  Blood pressure: 109/62. Patient status: Inpatient. Location: Bedside.  ------------------------------------------------------------  ------------------------------------------------------------  Left ventricle: The cavity size was normal. Wall thickness was increased in a pattern of moderate LVH. Systolic function was moderately reduced. The estimated ejection fraction was in the range of 35% to 40%. Diffuse hypokinesis. Early diastolic septal annular tissue Doppler velocities Ea were abnormal.  ------------------------------------------------------------ Aortic valve: Moderately to severely calcified annulus. Severely thickened leaflets. Doppler: There was severe stenosis. Valve area: 0.97cm^2(VTI). Indexed valve area: 0.43cm^2/m^2 (VTI). Peak velocity ratio of LVOT to aortic valve: 0.32. Valve area: 1.01cm^2 (Vmax). Indexed valve area: 0.45cm^2/m^2 (Vmax). Mean gradient: 68mm Hg (S). Peak gradient: 55mm Hg (S).  ------------------------------------------------------------ Aorta: Aortic root: The aortic root was normal in size. Ascending aorta: The ascending aorta was normal in size.  ------------------------------------------------------------ Mitral valve: Moderately thickened, severely calcified leaflets . Doppler: The findings are consistent with moderate stenosis. Valve area by pressure half-time: 1.29cm^2. Indexed valve area by pressure half-time: 0.58cm^2/m^2. Valve area by continuity equation (using LVOT flow): 0.9cm^2. Indexed valve area by continuity equation (using LVOT flow): 0.4cm^2/m^2. Mean gradient: 42mm Hg (D). Peak gradient: 105mm Hg (D).  ------------------------------------------------------------ Left atrium: The atrium was moderately to severely dilated.  ------------------------------------------------------------ Right ventricle: The cavity size was mildly dilated. Systolic function was mildly  reduced.  ------------------------------------------------------------ Pulmonic valve: The valve appears to be grossly normal. Doppler: Mild regurgitation.  ------------------------------------------------------------ Tricuspid valve: Structurally normal valve. Doppler: Trivial regurgitation.  ------------------------------------------------------------ Right atrium: The atrium was moderately dilated.  ------------------------------------------------------------ Pericardium: There was no pericardial effusion.  ------------------------------------------------------------  2D measurements Normal Doppler measurements Normal Left ventricle Left ventricle LVID ED, 47.4 mm 43-52 Ea, lat 4.4 cm/s ------ chord, ann, tiss PLAX DP LVID ES, 33.5 mm 23-38 E/Ea, lat 65.2 ------ chord, ann, tiss 3 PLAX DP FS, chord, 29 % >29 Ea, med 3.44 cm/s ------ PLAX ann, tiss LVPW, ED 16.5 mm ------ DP IVS/LVPW 1.03 <1.3 E/Ea, med 83.4 ------ ratio, ED ann, tiss 3 Ventricular septum DP IVS, ED 17 mm ------ LVOT LVOT Peak vel, 138 cm/s ------ Diam, S 20 mm ------ S Area 3.14 cm^2 ------ Peak 8 mm Hg ------ Aorta gradient, Root diam, 34 mm ------ S ED Aortic valve Left atrium Peak vel, 431 cm/s ------ AP dim 57 mm ------ S AP dim 2.56 cm/m^2 <2.2 Mean vel, 308 cm/s ------ index S Vol, S 157 ml ------ VTI, S 99.4 cm ------ Vol index, 70.4 ml/m^2 ------ Mean 43 mm Hg ------ S gradient, S Peak 74 mm Hg ------ gradient, S Area, VTI 0.97 cm^2 ------ Area index 0.43 cm^2/m ------ (VTI) ^2 Peak vel 0.32 ------ ratio, LVOT/AV Area, Vmax 1.01 cm^2 ------ Area index 0.45 cm^2/m ------ (Vmax) ^2 Mitral valve Peak E vel 287 cm/s ------ Peak A vel 232 cm/s ------ Mean vel, 196 cm/s ------ D Decelerati 673 ms 150-23 on time 0 Pressure 170 ms ------ half-time Mean 17 mm Hg ------ gradient, D Peak 34 mm Hg ------ gradient, D Peak E/A 1.2 ------ ratio Area (PHT) 1.29 cm^2 ------ Area  index 0.58 cm^2/m ------ (PHT) ^2 Area 0.9 cm^2 ------ (LVOT) continuity Area index 0.4 cm^2/m ------ (LVOT ^2 cont) Annulus 122 cm ------ VTI Systemic veins Estimated 20 mm Hg ------ CVP Right ventricle Sa vel, 5.7 cm/s ------ lat ann, tiss DP  ------------------------------------------------------------ Prepared and Electronically Authenticated by  Mertie Moores 2015-01-17T11:[id number]   Assessment/Plan: S/P Procedure(s) (LRB): LEFT AND RIGHT HEART CATHETERIZATION WITH CORONARY ANGIOGRAM (N/A)  Mr Geis looks and feels better than he did earlier in the week.  He is very eager to go home unless there  are plans for specific treatment requiring him to stay in the hospital.  I have personally reviewed the ECHO done earlier today.  Although his LV function appears somewhat decreased in comparison to previous [facility], I'm not sure that it's as different as suggested by the reported EF.  We could consider repeat TEE to further assess this, although I'm not sure it would change our treatment plan.  I favor giving him at least another week or two to recover since his ACE-I was stopped, and then we will need to make a final decision whether or not to proceed with very high risk AVR + MVR.  I will plan to see him in the office on Monday January 26th and check repeat BMET and proBNP at that time.  He should continue to stay off of Plavix.  OWEN,CLARENCE H 03/21/2013 4:03 PM

## 2013-03-21 NOTE — Discharge Summary (Signed)
Physician Discharge Summary  Patient ID: [name] MRN: [id number] DOB/AGE: 71-Aug-1942 71 y.o.  Admit date: 03/19/2013 Discharge date: 03/21/2013  Primary Discharge Diagnosis: 1. CAD: Severe 3 vessel native disease. 2. Severe AoV stenosis  Secondary Discharge Diagnosis: 1. Pulmonary Hypertension 2. Systolic Dysfunction  Significant Diagnostic Studies: 1. Cardiac Cath 03/20/2013  Final Conclusions:  1. Severe aortic stenosis  2. Moderate to severe mitral stenosis  3. Severe native three-vessel disease with total occlusion of the LAD, total occlusion left circumflex, and continued patency of the severely calcified, moderately diseased dominant RCA  4. Status post aortocoronary bypass surgery with continued patency of the saphenous vein graft sequential to OM1 and OM 2, known occlusion of the saphenous vein graft to RCA branches, and continued patency of the LIMA sequential to diagonal 2 and the mid LAD. However, total occlusion of the mid LAD just beyond the LIMA anastomosis.  5. Severe pulmonary hypertension.  Echocardiogram:03/21/2013 Left ventricle: The cavity size was normal. Wall thickness was increased in a pattern of moderate LVH. Systolic function was moderately reduced. The estimated ejection fraction was in the range of 35% to 40%. Diffuse hypokinesis. - Aortic valve: Moderately to severely calcified annulus. Severely thickened leaflets. There was severe stenosis. Valve area: 0.97cm^2(VTI). Valve area: 1.01cm^2 (Vmax). - Mitral valve: The findings are consistent with moderate stenosis. Valve area by pressure half-time: 1.29cm^2. Valve area by continuity equation (using LVOT flow): 0.9cm^2. - Left atrium: The atrium was moderately to severely dilated. - Right ventricle: The cavity size was mildly dilated. Systolic function was mildly reduced. - Right atrium: The atrium was moderately dilated.   Consults:  CVTS: Darylene Price MD  Hospital Course: Mrs.  Rojelio Brenner this is a 73-year-old patient admitted for cardiac catheterization with known history of rheumatic heart disease, aortic and mitral valve stenosis, coronary artery disease with history of coronary artery bypass grafting, PCI , chronic diastolic heart failure, with severe pulmonary hypertension. 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 She complained of progressive shortness of breath and weakness with near syncope. As result the patient was planned for a cardiac catheterization for reevaluation of coronary anatomy and aortic valve.    Cardiac catheterization was completed on 03/20/2013 by Dr. [name]. This revealed near native three-vessel disease with total occlusion of the LAD circumflex but continued patency of a severely calcified moderately diseased dominant RCA. She was found to have severe aortic valve stenosis and moderate to severe mitral stenosis. She was also noted to have continued patency of her SVG to OM1 and OM 2 with occlusion of the SVG to RCA branches. And continued patency of the LIMA sequential to diagonal 2 and mid LAD. She did have occlusion of the mid LAD just beyond the LIMA anastomosis.    Dr. Halford Chessman was consulted from CVTS to discuss need for redo and AV/MV valve repair. Dr. Roxy Manns did see the patient and recommended that she be taken off of Plavix and ramipril at discharge. Bradycardia was noted, and metoprolol was decreased to 12.5 mg twice a day. A repeat echocardiogram was completed and reviewed by Dr. Roxy Manns. LV function appeared decreased in comparison to previous echoes. However he did not feel it was as significant as suggested by the reported EF. It was his recommendation to consideration for TEE to further assess this. However, he was not certain would change the treatment plan.    He recommended giving the patient another week or  2 to recover off of ACE inhibitor. She is to followup with him on Monday, January  26 or for repeat BMET and pro BNP at that time period and is to remain off of Plavix. We will make a final decision concerning whether or not to proceed with a very high-risk AVR plus MVR.    This was discussed with Dr. Warren Lacy by Dr. Ricard Dillon prior to discharge. The patient will keep appointment with Dr. Alvan Dame as recently scheduled, and followup with Dr. [name] in one month.         Discharge Exam: Blood pressure 134/66, pulse 53, temperature 97.9 F (36.6 C), temperature source Oral, resp. rate 18, height 5\' 10"  (1.778 m), weight 234 lb 2.1 oz (106.2 kg), SpO2 97.00%.  Labs:   Lab Results  Component Value Date   WBC 7.7 03/19/2013   HGB 12.6* 03/19/2013   HCT 38.0* 03/19/2013   MCV 88.8 03/19/2013   PLT 206 03/19/2013        Radiology: Dg Chest Port 1 View  03/20/2013   CLINICAL DATA:  Short of breath  EXAM: PORTABLE CHEST - 1 VIEW  COMPARISON:  None.  FINDINGS: Cardiac enlargement with CABG changes. Negative for heart failure. Lungs are clear. Negative for infiltrate or effusion.  IMPRESSION: No active disease.   Electronically Signed   By: Franchot Gallo M.D.   On: 03/20/2013 07:44    EKG: Sinus bradycardia with 1st degree AV block with non-specific T-Wave abnormalities.  FOLLOW UP PLANS AND APPOINTMENTS Discharge Orders   Future Appointments Provider Department Dept Phone   05/26/2013 3:45 PM [name], MD [place] [facility] [phone]   06/15/2013 10:30 AM Rexene Alberts, MD [facility] [phone]   Future Orders Complete By Expires   Diet - low sodium heart healthy  As directed    Increase activity slowly  As directed        Medication List    STOP taking these medications       clopidogrel 75 MG tablet  Commonly known as:  PLAVIX     ramipril 10 MG capsule  Commonly known as:  ALTACE      TAKE these medications       ANTI MONKEY BUTT Powd  Apply 1 application topically as needed (burning itching drying  up). Apply to legs and rub in     aspirin EC 81 MG tablet  Take 81 mg by mouth daily.     Coenzyme Q10 300 MG Caps  Take 300 mg by mouth daily.     ezetimibe 10 MG tablet  Commonly known as:  ZETIA  Take 10 mg by mouth every evening.     fenofibrate micronized 130 MG capsule  Commonly known as:  ANTARA  Take 130 mg by mouth every evening.     fish oil-omega-3 fatty acids 1000 MG capsule  Take 1 g by mouth 2 (two) times daily.     furosemide 80 MG tablet  Commonly known as:  LASIX  Take 1 tablet (80 mg total) by mouth 2 (two) times daily.     JANUVIA 100 MG tablet  Generic drug:  sitaGLIPtin  Take 100 mg by mouth at bedtime.     LANTUS 100 UNIT/ML injection  Generic drug:  insulin glargine  Inject 70 Units into the skin 3 (three) times daily.     LOVAZA 1 G capsule  Generic drug:  omega-3 acid ethyl esters  Take 1 g by  mouth 2 (two) times daily.     metFORMIN 1000 MG tablet  Commonly known as:  GLUCOPHAGE  Take 1,000 mg by mouth 2 (two) times daily with a meal.     methocarbamol 500 MG tablet  Commonly known as:  ROBAXIN  Take 500 mg by mouth daily as needed (leg cramps).     metoprolol tartrate 12.5 mg Tabs tablet  Commonly known as:  LOPRESSOR  Take 0.5 tablets (12.5 mg total) by mouth 2 (two) times daily.     nitroGLYCERIN 0.4 MG SL tablet  Commonly known as:  NITROSTAT  Place 1 tablet (0.4 mg total) under the tongue every 5 (five) minutes as needed for chest pain. May repeat up to 3 times as needed     OVER THE COUNTER MEDICATION  Apply 1 application topically 2 (two) times daily. Cream used for itching. cream includes camphor.     pantoprazole 40 MG tablet  Commonly known as:  PROTONIX  Take 40 mg by mouth every morning.     Potassium Chloride ER 20 MEQ Tbcr  Take 20 mEq by mouth 2 (two) times daily.     ranitidine 150 MG tablet  Commonly known as:  ZANTAC  150 mg 2 (two) times daily.     repaglinide 2 MG tablet  Commonly known as:  PRANDIN  Take  2 mg by mouth 2 (two) times daily.     rosuvastatin 20 MG tablet  Commonly known as:  CRESTOR  Take 1 tablet (20 mg total) by mouth at bedtime.     sertraline 50 MG tablet  Commonly known as:  ZOLOFT  Take 50 mg by mouth every morning.     Vitamin D (Ergocalciferol) 50000 UNITS Caps capsule  Commonly known as:  DRISDOL  Take 50,000 Units by mouth every 7 (seven) days. Take every week on saturday           Follow-up Information   Follow up with Rexene Alberts, MD On 03/30/2013. (Call office to confirm time please)    Specialty:  Cardiothoracic Surgery   Contact information:   [address] [place] [address] [phone]       Follow up with [name], MD In 1 month. (Our office will call you for appt,.)    Specialty:  Cardiology   Contact information:   [id number] [place] [address] [phone]         Time spent with patient to include physician time:40 minutes. Signed: Jory Sims 03/21/2013, 4:30 PM Co-Sign MD

## 2013-03-21 NOTE — Progress Notes (Signed)
Discharged to home with family office visits in place teaching done  

## 2013-03-21 NOTE — Progress Notes (Signed)
  Echocardiogram 2D Echocardiogram has been performed.  Basilia Jumbo 03/21/2013, 10:48 AM

## 2013-03-23 ENCOUNTER — Other Ambulatory Visit: Payer: Self-pay

## 2013-03-23 ENCOUNTER — Other Ambulatory Visit: Payer: Self-pay | Admitting: *Deleted

## 2013-03-23 DIAGNOSIS — I359 Nonrheumatic aortic valve disorder, unspecified: Secondary | ICD-10-CM

## 2013-03-23 DIAGNOSIS — I350 Nonrheumatic aortic (valve) stenosis: Secondary | ICD-10-CM

## 2013-03-23 DIAGNOSIS — I509 Heart failure, unspecified: Secondary | ICD-10-CM

## 2013-03-23 DIAGNOSIS — I050 Rheumatic mitral stenosis: Secondary | ICD-10-CM

## 2013-03-24 ENCOUNTER — Telehealth: Payer: Self-pay | Admitting: *Deleted

## 2013-03-24 DIAGNOSIS — I359 Nonrheumatic aortic valve disorder, unspecified: Secondary | ICD-10-CM

## 2013-03-24 DIAGNOSIS — I059 Rheumatic mitral valve disease, unspecified: Secondary | ICD-10-CM

## 2013-03-24 NOTE — Telephone Encounter (Signed)
Received a notice from pt's insurance that Methocarbamol is non formulary. The suggested alternative is Tizanidine tabs. If alternative is not appropriate, a prior auth needs to be obtained. Prior auth forms placed in inbox along with letter from insurance.

## 2013-03-25 ENCOUNTER — Other Ambulatory Visit: Payer: Self-pay

## 2013-03-25 MED ORDER — FENOFIBRATE MICRONIZED 130 MG PO CAPS: 130.0000 mg | ORAL_CAPSULE | Freq: Every evening | ORAL | Status: DC

## 2013-03-25 NOTE — Telephone Encounter (Signed)
Form done. Would continue methocarbamol.  Thanks.

## 2013-03-26 NOTE — Telephone Encounter (Signed)
Paperwork submitted along with a copy of 07/09/12 office visit notes. Pending notification from insurance company.

## 2013-03-30 ENCOUNTER — Ambulatory Visit (INDEPENDENT_AMBULATORY_CARE_PROVIDER_SITE_OTHER): Payer: Medicare Other | Admitting: Thoracic Surgery (Cardiothoracic Vascular Surgery)

## 2013-03-30 ENCOUNTER — Encounter: Payer: Self-pay | Admitting: Thoracic Surgery (Cardiothoracic Vascular Surgery)

## 2013-03-30 ENCOUNTER — Other Ambulatory Visit: Payer: Self-pay | Admitting: *Deleted

## 2013-03-30 ENCOUNTER — Ambulatory Visit
Admission: RE | Admit: 2013-03-30 | Discharge: 2013-03-30 | Disposition: A | Discharge status: Home / Self Care | Payer: Medicare Other | Source: Ambulatory Visit | Attending: Thoracic Surgery (Cardiothoracic Vascular Surgery) | Admitting: Thoracic Surgery (Cardiothoracic Vascular Surgery)

## 2013-03-30 VITALS — BP 97/61 | HR 70 | Resp 16 | Ht 70.0 in | Wt 240.0 lb

## 2013-03-30 DIAGNOSIS — I2510 Atherosclerotic heart disease of native coronary artery without angina pectoris: Secondary | ICD-10-CM

## 2013-03-30 DIAGNOSIS — I359 Nonrheumatic aortic valve disorder, unspecified: Secondary | ICD-10-CM

## 2013-03-30 DIAGNOSIS — Z951 Presence of aortocoronary bypass graft: Secondary | ICD-10-CM

## 2013-03-30 DIAGNOSIS — I050 Rheumatic mitral stenosis: Secondary | ICD-10-CM

## 2013-03-30 DIAGNOSIS — I350 Nonrheumatic aortic (valve) stenosis: Secondary | ICD-10-CM

## 2013-03-30 NOTE — Progress Notes (Signed)
[address]       [place] [address]             [phone]     CARDIOTHORACIC SURGERY OFFICE NOTE  Referring Provider is [name], MD PCP is [name], MD   HPI:  Patient returns for followup of rheumatic aortic stenosis, mitral stenosis, chronic diastolic congestive heart failure with severe pulmonary hypertension, and severe ischemic heart disease. He was last seen here in our office on 03/09/2013 and since then he underwent repeat diagnostic cardiac catheterization by Dr. [name] 03/20/2013. I saw him briefly while he was in the hospital at that time and the patient returns to the office for followup to see how he has recovered and to discuss treatment options further.  [name] [name] reports that ever since he left the hospital he has been feeling better than he had for several months. He is back to normal activity and he reports no problems with shortness of breath.  He is accompanied to the office with his wife today who corroborates the fact that he has been quite active physically over the past week and he has reported no problems whatsoever.   Current Outpatient Prescriptions  Medication Sig Dispense Refill  . aspirin EC 81 MG tablet Take 81 mg by mouth daily.      . Coenzyme Q10 300 MG CAPS Take 300 mg by mouth daily.       Marland Kitchen ezetimibe (ZETIA) 10 MG tablet Take 10 mg by mouth every evening.      . fenofibrate micronized (ANTARA) 130 MG capsule Take 1 capsule (130 mg total) by mouth every evening.  30 capsule  6  . fish oil-omega-3 fatty acids 1000 MG capsule Take 1 g by mouth 2 (two) times daily.       . furosemide (LASIX) 80 MG tablet Take 1 tablet (80 mg total) by mouth 2 (two) times daily.  60 tablet  5  . insulin glargine (LANTUS) 100 UNIT/ML injection Inject 70 Units into the skin 3 (three) times daily.       Marland Kitchen JANUVIA 100 MG tablet Take 100 mg by mouth at bedtime.       Marland Kitchen LOVAZA 1 G capsule Take 1 g by mouth 2 (two) times daily.       .  metFORMIN (GLUCOPHAGE) 1000 MG tablet Take 1,000 mg by mouth 2 (two) times daily with a meal.      . methocarbamol (ROBAXIN) 500 MG tablet Take 500 mg by mouth daily as needed (leg cramps).      . metoprolol tartrate (LOPRESSOR) 12.5 mg TABS tablet Take 0.5 tablets (12.5 mg total) by mouth 2 (two) times daily.  30 tablet  3  . nitroGLYCERIN (NITROSTAT) 0.4 MG SL tablet Place 1 tablet (0.4 mg total) under the tongue every 5 (five) minutes as needed for chest pain. May repeat up to 3 times as needed  30 tablet  5  . OVER THE COUNTER MEDICATION Apply 1 application topically 2 (two) times daily. Cream used for itching. cream includes camphor.      . pantoprazole (PROTONIX) 40 MG tablet Take 40 mg by mouth every morning.      . Potassium Chloride ER 20 MEQ TBCR Take 20 mEq by mouth 2 (two) times daily.  60 tablet  5  . ranitidine (ZANTAC) 150 MG tablet 150 mg 2 (two) times daily.       . repaglinide (PRANDIN) 2 MG tablet Take 2  mg by mouth 2 (two) times daily.       . rosuvastatin (CRESTOR) 20 MG tablet Take 1 tablet (20 mg total) by mouth at bedtime.  30 tablet  6  . sertraline (ZOLOFT) 50 MG tablet Take 50 mg by mouth every morning.      . Vitamin D, Ergocalciferol, (DRISDOL) 50000 UNITS CAPS Take 50,000 Units by mouth every 7 (seven) days. Take every week on saturday      . Powders (ANTI MONKEY BUTT) POWD Apply 1 application topically as needed (burning itching drying up). Apply to legs and rub in      . [DISCONTINUED] promethazine (PHENERGAN) 25 MG tablet Take 1/2 to 1 tablet up to every 6 hours as needed for nausea.        No current facility-administered medications for this visit.      Physical Exam:   BP 97/61  Pulse 70  Resp 16  Ht 5\' 10"  (1.778 m)  Wt 240 lb (108.863 kg)  BMI 34.44 kg/m2  SpO2 93%  General:  Well-appearing  Chest:   Clear to auscultation  CV:   Regular rate and rhythm with crescendo decrescendo systolic murmur  Incisions:  n/a  Abdomen:  Soft  nontender  Extremities:  Warm and well-perfused with mild lower extremity edema, decreased from previously  Diagnostic Tests:   CHEST 2 VIEW  COMPARISON: DG CHEST 1V PORT dated 03/20/2013; CT HEART MORP W/CTA  COR W/SCORE W/CA W/CM &/OR WO/CM dated 06/04/2012  FINDINGS:  Trachea is midline. Heart is mildly enlarged, stable. Possible  calcified granuloma in the right upper lobe. Lungs are otherwise  clear. No pleural fluid. Extensive degenerative change in the spine.  IMPRESSION:  No acute findings.  Electronically Signed  By: Leanna Battles M.D.  On: 03/30/2013 11:38  Transthoracic Echocardiography  Patient:    [name] [name], [name] [name] MR #:       [id number] Study Date: 03/21/2013 Gender:     M Age:        72 Height:     177.8cm Weight:     106.1kg BSA:        2.19m^2 Pt. Status: Room:       [facility]    ADMITTING    [name]  ATTENDING    Karlene Lineman, Randa Evens  SONOGRAPHER  Dewitt Hoes, RDCS  PERFORMING   [facility], Inpatient cc:  ------------------------------------------------------------ LV EF: 35% -   40%  ------------------------------------------------------------ Indications:      Aortic stenosis 424.1.  ------------------------------------------------------------ History:   PMH:   Dyspnea.  Coronary artery disease. Congestive heart failure.  Aortic stenosis.  Mitral stenosis.  Risk factors:  Hyperpotassemia. PVD. GERD. Hiatal hernia. Gastroenteritis. Cellulitis. Low back pain. Sleep apnea. Leg edema. Carotid bruit. Diarrhea. Appendicitis. Actinic keratosis. Cough. URI. Rheumatic heart disease. Squamous cell carcinoma. Hypertension. Diabetes mellitus. Obese. Dyslipidemia.  ------------------------------------------------------------ Study Conclusions  - Left ventricle: The cavity size was normal. Wall thickness   was increased in a pattern of moderate LVH. Systolic   function was moderately reduced. The  estimated ejection   fraction was in the range of 35% to 40%. Diffuse   hypokinesis. - Aortic valve: Moderately to severely calcified annulus.   Severely thickened leaflets. There was severe stenosis.   Valve area: 0.97cm^2(VTI). Valve area: 1.01cm^2 (Vmax). - Mitral valve: The findings are consistent with moderate   stenosis. Valve area by pressure half-time: 1.29cm^2.   Valve area by continuity  equation (using LVOT flow):   0.9cm^2. - Left atrium: The atrium was moderately to severely   dilated. - Right ventricle: The cavity size was mildly dilated.   Systolic function was mildly reduced. - Right atrium: The atrium was moderately dilated.  ------------------------------------------------------------ Labs, prior tests, procedures, and surgery: Coronary artery bypass grafting.  Transthoracic echocardiography.  M-mode, complete 2D, spectral Doppler, and color Doppler.  Height:  Height: 177.8cm. Height: 70in.  Weight:  Weight: 106.1kg. Weight: 233.5lb.  Body mass index:  BMI: 33.6kg/m^2.  Body surface area:    BSA: 2.30m^2.  Blood pressure:     109/62.  Patient status:  Inpatient.  Location:  Bedside.  ------------------------------------------------------------  ------------------------------------------------------------ Left ventricle:  The cavity size was normal. Wall thickness was increased in a pattern of moderate LVH. Systolic function was moderately reduced. The estimated ejection fraction was in the range of 35% to 40%. Diffuse hypokinesis. Early diastolic septal annular tissue Doppler velocities Ea were abnormal.  ------------------------------------------------------------ Aortic valve:   Moderately to severely calcified annulus. Severely thickened leaflets.  Doppler:   There was severe stenosis.      Valve area: 0.97cm^2(VTI). Indexed valve area: 0.43cm^2/m^2 (VTI). Peak velocity ratio of LVOT to aortic valve: 0.32. Valve area: 1.01cm^2 (Vmax). Indexed valve area:  0.45cm^2/m^2 (Vmax).    Mean gradient: 54mm Hg (S). Peak gradient: 18mm Hg (S).  ------------------------------------------------------------ Aorta:  Aortic root: The aortic root was normal in size. Ascending aorta: The ascending aorta was normal in size.  ------------------------------------------------------------ Mitral valve:   Moderately thickened, severely calcified leaflets .  Doppler:   The findings are consistent with moderate stenosis.      Valve area by pressure half-time: 1.29cm^2. Indexed valve area by pressure half-time: 0.58cm^2/m^2. Valve area by continuity equation (using LVOT flow): 0.9cm^2. Indexed valve area by continuity equation (using LVOT flow): 0.4cm^2/m^2.    Mean gradient: 18mm Hg (D). Peak gradient: 49mm Hg (D).  ------------------------------------------------------------ Left atrium:  The atrium was moderately to severely dilated.   ------------------------------------------------------------ Right ventricle:  The cavity size was mildly dilated. Systolic function was mildly reduced.  ------------------------------------------------------------ Pulmonic valve:    The valve appears to be grossly normal.  Doppler:   Mild regurgitation.  ------------------------------------------------------------ Tricuspid valve:   Structurally normal valve.    Doppler: Trivial regurgitation.  ------------------------------------------------------------ Right atrium:  The atrium was moderately dilated.  ------------------------------------------------------------ Pericardium:  There was no pericardial effusion.  ------------------------------------------------------------  2D measurements        Normal  Doppler measurements   Normal Left ventricle                 Left ventricle LVID ED,   47.4 mm     43-52   Ea, lat     4.4 cm/s   ------ chord,                         ann, tiss PLAX                           DP LVID ES,   33.5 mm     23-38   E/Ea, lat  65.2         ------ chord,                         ann, tiss     3 PLAX  DP FS, chord,   29 %      >29     Ea, med    3.44 cm/s   ------ PLAX                           ann, tiss LVPW, ED   16.5 mm     ------  DP IVS/LVPW   1.03        <1.3    E/Ea, med  83.4        ------ ratio, ED                      ann, tiss     3 Ventricular septum             DP IVS, ED      17 mm     ------  LVOT LVOT                           Peak vel,   138 cm/s   ------ Diam, S      20 mm     ------  S Area       3.14 cm^2   ------  Peak          8 mm Hg  ------ Aorta                          gradient, Root diam,   34 mm     ------  S ED                             Aortic valve Left atrium                    Peak vel,   431 cm/s   ------ AP dim       57 mm     ------  S AP dim     2.56 cm/m^2 <2.2    Mean vel,   308 cm/s   ------ index                          S Vol, S      157 ml     ------  VTI, S     99.4 cm     ------ Vol index, 70.4 ml/m^2 ------  Mean         43 mm Hg  ------ S                              gradient,                                S                                Peak         74 mm Hg  ------                                gradient,  S                                Area, VTI  0.97 cm^2   ------                                Area index 0.43 cm^2/m ------                                (VTI)           ^2                                Peak vel   0.32        ------                                ratio,                                LVOT/AV                                Area, Vmax 1.01 cm^2   ------                                Area index 0.45 cm^2/m ------                                (Vmax)          ^2                                Mitral valve                                Peak E vel  287 cm/s   ------                                Peak A vel  232 cm/s   ------                                Mean vel,   196 cm/s   ------                                 D                                Decelerati  673 ms     150-23  on time                0                                Pressure    170 ms     ------                                half-time                                Mean         17 mm Hg  ------                                gradient,                                D                                Peak         34 mm Hg  ------                                gradient,                                D                                Peak E/A    1.2        ------                                ratio                                Area (PHT) 1.29 cm^2   ------                                Area index 0.58 cm^2/m ------                                (PHT)           ^2                                Area        0.9 cm^2   ------                                (LVOT)  continuity                                Area index  0.4 cm^2/m ------                                (LVOT           ^2                                cont)                                Annulus     122 cm     ------                                VTI                                Systemic veins                                Estimated    20 mm Hg  ------                                CVP                                Right ventricle                                Sa vel,     5.7 cm/s   ------                                lat ann,                                tiss DP   ------------------------------------------------------------ Prepared and Electronically Authenticated by  Mertie Moores 2015-01-17T11:[id number]       Cardiac Catheterization Procedure Note  Name: [name] [name]. MRN: [id number] DOB: 18-Apr-1940  Procedure: Right Heart Cath, Left Heart Cath, Selective Coronary Angiography, LIMA angiography, SVG angiography  Indication: CHF, Rheumatic aortic and  mitral stenosis            Procedural Details: The right groin was prepped, draped, and anesthetized with 1% lidocaine. Using the modified Seldinger technique a 5 French sheath was placed in the right femoral artery and a 7 French sheath was placed in the right femoral vein. A Swan-Ganz catheter was used for the right heart catheterization. Standard protocol was followed for recording of right heart pressures and sampling of oxygen saturations. Fick cardiac output was calculated. Standard Judkins catheters were used for selective coronary angiography. An AL-1 catheter was used to direct an exchange length straight tip wire across the aortic valve and this  was changed out for a pigtail catheter. Simultaneous LV and pulmonary capillary wedge pressures were recorded to evaluate mitral stenosis. A pullback across the aortic valve was done to evaluate aortic stenosis. Ventriculography was deferred because of chronic kidney disease. Saphenous vein graft angiography was performed with the JR 4 catheter. LIMA angiography was performed of the LIMA catheter. There were no immediate procedural complications. The patient was transferred to the post catheterization recovery area for further monitoring.  Procedural Findings: Hemodynamics RA A wave 19, V wave 17, mean 14 RV 96/18 PA 98/34 with a mean of 56 PCWP A wave 28, V wave 31, mean 27 LV 167/23 AO 118/61 with a mean of 84  Oxygen saturations: PA 56 AO 91  Cardiac Output (Fick) 5.0 L per min  Cardiac Index (Fick) 2.2 L per minute per meter squared  Aortic valve hemodynamics: The peak gradient 49 mm mercury, mean gradient 37 mm mercury, aortic valve area 1.0 cm.  Mitral valve hemodynamics:   Mean gradient 11, mitral valve area 1.16 cm  Coronary angiography: Coronary dominance: right  Left mainstem: The left main is severely calcified. The vessel has 90% distal stenosis.  Left anterior descending (LAD): The LAD is occluded at the first septal  perforator. The entirety of the LAD is severely calcified always to the LV apex.  Left circumflex (LCx): The left circumflex is totally occluded. There is a sequential vein graft supplying the first and second OM branches with continued patency  Right coronary artery (RCA): The RCA is severely calcified throughout its entirety. The vessel is stented with continued stent patency. There is mild in-stent restenosis in the mid and distal vessel. There is diffuse disease throughout the proximal, mid, and distal RCA, but no more than 40-50% stenosis noted. The PDA and PLA branches have no evidence of high-grade stenosis.  Saphenous vein graft sequential to OM1 and OM 2: Widely patent throughout  LIMA sequential to second diagonal and LAD: The diagonal is patent beyond the LIMA anastomosis with 70% stenosis noted as it divides into twin vessels. The diagonal is small through this region. The sequential limb to the LAD is patent. However, the LAD is totally occluded at the anastomotic site. Previously the vessel was severely diseased throughout.  Left ventriculography: Deferred because of chronic kidney disease  Final Conclusions:   1. Severe aortic stenosis 2. Moderate to severe mitral stenosis 3. 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 Status post aortocoronary bypass surgery with continued patency of the saphenous vein graft sequential to OM1 and OM 2, known occlusion of the saphenous vein graft to RCA branches, and continued patency of the LIMA sequential to diagonal 2 and the mid LAD. However, total occlusion of the mid LAD just beyond the LIMA anastomosis. 5. Severe pulmonary hypertension.  [name] 03/20/2013, 2:55 PM    Impression:  Mr. Wallett appears to have recovered completely from his recent acute exacerbation of chronic diastolic  congestive heart failure. His blood pressure was notably quite low and he has been looking and feeling much better ever since his ACE inhibitor was discontinued. However, followup echocardiogram suggests that left ventricular systolic function has deteriorated somewhat, and his recent cardiac catheterization demonstrates some further progression of his native three-vessel coronary artery disease with now chronic occlusion of the distal left anterior descending coronary artery beyond insertion of the patent left internal mammary artery graft.  Plan:  I have again reviewed options at length with Mr. Revak and his wife in the office this afternoon. We discussed long-term medical therapy without any type of surgical intervention, extremely high-risk surgery for aortic valve replacement, mitral valve replacement, and possible redo coronary artery bypass grafting, versus trans-catheter aortic valve replacement. The relative risks and benefits of each of these 3 approaches were discussed.  Mr. Switalski has made it clear that as long as he feels as well as he does presently he does not wish to proceed with any type of intervention. However, he is well aware of the fact that his condition will likely deteriorate, and if we are ever going to consider some type of high-risk intervention it would be unwise to wait for him to develop decompensated heart failure with or without renal failure. If that were to occur the likelihood of him to survive with a reasonable outcome would likely be lost.  On the other hand, continued close observation is not unreasonable under the circumstances. We will plan to see him back in the valve clinic in 2 months.  During the interim period of time he should resume taking Plavix.   Valentina Gu. Roxy Manns, MD 03/30/2013 1:53 PM

## 2013-03-30 NOTE — Telephone Encounter (Signed)
Approval form received by insurance company. Will send to provider for signature, then to be scanned into patients medical record.  

## 2013-03-30 NOTE — Patient Instructions (Signed)
Resume taking Plavix

## 2013-03-31 LAB — BASIC METABOLIC PANEL
BUN: 34 mg/dL — AB (ref 6–23)
CHLORIDE: 98 meq/L (ref 96–112)
CO2: 28 mEq/L (ref 19–32)
CREATININE: 1.99 mg/dL — AB (ref 0.50–1.35)
Calcium: 10.5 mg/dL (ref 8.4–10.5)
Glucose, Bld: 165 mg/dL — ABNORMAL HIGH (ref 70–99)
POTASSIUM: 4.6 meq/L (ref 3.5–5.3)
Sodium: 138 mEq/L (ref 135–145)

## 2013-04-01 LAB — PRO B NATRIURETIC PEPTIDE: PRO B NATRI PEPTIDE: 7270 pg/mL — AB (ref <126)

## 2013-04-08 ENCOUNTER — Other Ambulatory Visit: Payer: Self-pay

## 2013-04-08 DIAGNOSIS — I359 Nonrheumatic aortic valve disorder, unspecified: Secondary | ICD-10-CM

## 2013-04-08 MED ORDER — FUROSEMIDE 80 MG PO TABS: 80.0000 mg | ORAL_TABLET | Freq: Two times a day (BID) | ORAL | Status: DC

## 2013-04-14 ENCOUNTER — Encounter (HOSPITAL_COMMUNITY): Payer: Self-pay | Admitting: Cardiovascular Disease

## 2013-04-15 ENCOUNTER — Other Ambulatory Visit: Payer: Self-pay

## 2013-04-15 DIAGNOSIS — I359 Nonrheumatic aortic valve disorder, unspecified: Secondary | ICD-10-CM

## 2013-04-15 MED ORDER — POTASSIUM CHLORIDE ER 20 MEQ PO TBCR: 20.0000 meq | EXTENDED_RELEASE_TABLET | Freq: Two times a day (BID) | ORAL | Status: DC

## 2013-04-24 ENCOUNTER — Other Ambulatory Visit: Payer: Self-pay

## 2013-04-24 MED ORDER — NITROGLYCERIN 0.4 MG SL SUBL: 0.4000 mg | SUBLINGUAL_TABLET | SUBLINGUAL | Status: DC | PRN

## 2013-05-04 ENCOUNTER — Other Ambulatory Visit: Payer: Self-pay

## 2013-05-04 MED ORDER — EZETIMIBE 10 MG PO TABS: 10.0000 mg | ORAL_TABLET | Freq: Every evening | ORAL | Status: DC

## 2013-05-22 ENCOUNTER — Encounter (HOSPITAL_COMMUNITY): Payer: Self-pay | Admitting: Thoracic Surgery (Cardiothoracic Vascular Surgery)

## 2013-05-22 ENCOUNTER — Ambulatory Visit (HOSPITAL_BASED_OUTPATIENT_CLINIC_OR_DEPARTMENT_OTHER)
Admission: RE | Admit: 2013-05-22 | Discharge: 2013-05-22 | Disposition: A | Discharge status: Home / Self Care | Payer: Medicare Other | Source: Ambulatory Visit | Attending: Thoracic Surgery (Cardiothoracic Vascular Surgery) | Admitting: Thoracic Surgery (Cardiothoracic Vascular Surgery)

## 2013-05-22 ENCOUNTER — Ambulatory Visit (HOSPITAL_COMMUNITY)
Admission: RE | Admit: 2013-05-22 | Discharge: 2013-05-22 | Disposition: A | Discharge status: Home / Self Care | Payer: Medicare Other | Source: Ambulatory Visit | Attending: Cardiovascular Disease | Admitting: Cardiovascular Disease

## 2013-05-22 ENCOUNTER — Encounter (HOSPITAL_COMMUNITY): Payer: Self-pay | Admitting: Cardiovascular Disease

## 2013-05-22 VITALS — BP 126/58 | HR 73 | Resp 20 | Ht 70.0 in | Wt 236.8 lb

## 2013-05-22 DIAGNOSIS — I359 Nonrheumatic aortic valve disorder, unspecified: Secondary | ICD-10-CM | POA: Insufficient documentation

## 2013-05-22 DIAGNOSIS — I5033 Acute on chronic diastolic (congestive) heart failure: Secondary | ICD-10-CM | POA: Insufficient documentation

## 2013-05-22 DIAGNOSIS — I050 Rheumatic mitral stenosis: Secondary | ICD-10-CM

## 2013-05-22 DIAGNOSIS — I509 Heart failure, unspecified: Secondary | ICD-10-CM

## 2013-05-22 DIAGNOSIS — I080 Rheumatic disorders of both mitral and aortic valves: Secondary | ICD-10-CM

## 2013-05-22 DIAGNOSIS — I5032 Chronic diastolic (congestive) heart failure: Secondary | ICD-10-CM

## 2013-05-22 DIAGNOSIS — I350 Nonrheumatic aortic (valve) stenosis: Secondary | ICD-10-CM

## 2013-05-22 LAB — BASIC METABOLIC PANEL
BUN: 37 mg/dL — ABNORMAL HIGH (ref 6–23)
CO2: 25 mEq/L (ref 19–32)
Calcium: 10.1 mg/dL (ref 8.4–10.5)
Chloride: 97 mEq/L (ref 96–112)
Creatinine, Ser: 1.77 mg/dL — ABNORMAL HIGH (ref 0.50–1.35)
GFR, EST AFRICAN AMERICAN: 43 mL/min — AB (ref >90)
GFR, EST NON AFRICAN AMERICAN: 37 mL/min — AB (ref >90)
GLUCOSE: 219 mg/dL — AB (ref 70–99)
POTASSIUM: 4.5 meq/L (ref 3.7–5.3)
SODIUM: 141 meq/L (ref 137–147)

## 2013-05-22 NOTE — Progress Notes (Signed)
MULTIDISCIPLINARY HEART VALVE CLINIC NOTE  Patient ID: [name] [name]. MRN: [id number] DOB/AGE: Oct 27, 1940 73 y.o.  Primary Care Physician:Graham Damita Dunnings, MD Primary Cardiologist: Dr [name]  HPI: 73 year old gentleman presenting for followup evaluation. The patient has severe rheumatic heart disease with severe aortic stenosis and at least moderate mitral stenosis. He also has severe pulmonary hypertension. He has extensive coronary artery disease with previous CABG and PCI. He has undergone extensive evaluation in the multidisciplinary heart valve clinic previously. He's been followed regularly over the past year by Dr Roxy Manns and I. The patient has been considered for double valve surgery as well as transcatheter aortic valve replacement. He had significant clinical improvement after balloon aortic valvuloplasty in February 2014. However, followup echocardiography has demonstrated severe restenosis of his aortic valve.  The patient presents now with progressive dyspnea and leg swelling. This is occurred over the last few weeks. Within the past one week he has increased his furosemide to 160 mg in the morning and 80 mg in the afternoon. He is improved on this dose. Prior to doing this. He had experienced orthopnea and dyspnea with minimal exertion. He's had no chest pain or pressure. He denies lightheadedness or syncope.  Past Medical History  Diagnosis Date  . GERD (gastroesophageal reflux disease)   . Hyperlipidemia   . Hypertension   . Diabetes mellitus   . Carotid artery disease   . Cellulitis and abscess of leg, except foot   . Leg edema   . Right knee sprain   . Cramps, muscle, general   . Low back pain   . Hiatal hernia   . Chills   . Hearing loss   . Nasal congestion   . Cough   . Wheezing   . Generalized headaches   . CAD (coronary artery disease) 11/05/1996    a. CABG x6 by Dr Redmond Pulling - LIMA to Diag+LAD, SVG to OM1+OM2, SVG to PDA+RPL, b. VG->RCA known to be occluded;  c. 05/2012  PCI/Rota/BMS to m/dRCA w/ 3.5x15 Vision BMS x 2 post-dil to 3.75.  Marland Kitchen Mitral stenosis     a. moderate by echo 04/2012.  Marland Kitchen Chronic diastolic congestive heart failure     a. 04/2012 Echo: EF 55-60-%  . Aortic stenosis     a. 04/2012 s/p baloon valvuloplasty w/ reduction in gradient from 47 to 27.  . Pulmonary hypertension     a. 05/2012 RH cath: PA 97/32(54)  . Venous insufficiency (chronic) (peripheral)   . Sleep apnea     no cpap  sleep study >5 yrs  . Shortness of breath     Past Surgical History  Procedure Laterality Date  . Ett cardiolite  12/31/2006    Mild-Mod distal inf/apical ischemia  . Open heart surgery    . Acute or chronic diastolic hf  3/7/ - 10/07/8848    RLE cellulitis - HOSP  . Doppler echocardiography  10/11/2008    Mild AS, Mild-Mod MS, Severe LVH  . Le arterial and venous US  10/11/2008    Art clear.  Venous Neg DVT  . Appendectomy  12/25/2010    Dr Redmond Pulling  . Umbilical hernia repair  12/25/2010    Dr Redmond Pulling  . Hernia repair  27/74/12    umbilical hernia repair   . Tee without cardioversion  03/20/2012    Procedure: TRANSESOPHAGEAL ECHOCARDIOGRAM (TEE);  Surgeon: [name], MD;  Location: [facility];  Service: Cardiovascular;  Laterality: N/A;  . Cardiac catheterization      Family  History  Problem Relation Age of Onset  . Diabetes Other     History   Social History  . Marital Status: Married    Spouse Name: N/A    Number of Children: N/A  . Years of Education: N/A   Occupational History  . Not on file.   Social History Main Topics  . Smoking status: Never Smoker   . Smokeless tobacco: Never Used  . Alcohol Use: No  . Drug Use: No  . Sexual Activity: Not on file   Other Topics Concern  . Not on file   Social History Narrative  . No narrative on file     Prior to Admission medications   Medication Sig Start Date End Date Taking? Authorizing Provider  aspirin EC 81 MG tablet Take 81 mg by mouth daily.   Yes Historical Provider, MD   Coenzyme Q10 300 MG CAPS Take 300 mg by mouth daily.    Yes Historical Provider, MD  ezetimibe (ZETIA) 10 MG tablet Take 1 tablet (10 mg total) by mouth every evening. 05/04/13  Yes [name], MD  fenofibrate micronized (ANTARA) 130 MG capsule Take 1 capsule (130 mg total) by mouth every evening. 03/25/13  Yes [name], MD  fish oil-omega-3 fatty acids 1000 MG capsule Take 1 g by mouth 2 (two) times daily.    Yes Historical Provider, MD  furosemide (LASIX) 80 MG tablet Take 1 tablet (80 mg total) by mouth 2 (two) times daily. 04/08/13  Yes [name], MD  insulin glargine (LANTUS) 100 UNIT/ML injection Inject 70 Units into the skin 3 (three) times daily.    Yes Historical Provider, MD  JANUVIA 100 MG tablet Take 100 mg by mouth at bedtime.  04/24/12  Yes Historical Provider, MD  LOVAZA 1 G capsule Take 1 g by mouth 2 (two) times daily.  04/24/12  Yes Historical Provider, MD  metFORMIN (GLUCOPHAGE) 1000 MG tablet Take 1,000 mg by mouth 2 (two) times daily with a meal. 05/09/12  Yes Rogelia Mire, NP  methocarbamol (ROBAXIN) 500 MG tablet Take 500 mg by mouth daily as needed (leg cramps).   Yes Historical Provider, MD  metoprolol tartrate (LOPRESSOR) 12.5 mg TABS tablet Take 0.5 tablets (12.5 mg total) by mouth 2 (two) times daily. 03/21/13  Yes Lendon Colonel, NP  nitroGLYCERIN (NITROSTAT) 0.4 MG SL tablet Place 1 tablet (0.4 mg total) under the tongue every 5 (five) minutes as needed for chest pain. May repeat up to 3 times as needed 04/24/13 01/23/15 Yes [name], MD  OVER THE COUNTER MEDICATION Apply 1 application topically 2 (two) times daily. Cream used for itching. cream includes camphor.   Yes Historical Provider, MD  pantoprazole (PROTONIX) 40 MG tablet Take 40 mg by mouth every morning.   Yes Historical Provider, MD  Potassium Chloride ER 20 MEQ TBCR Take 20 mEq by mouth 2 (two) times daily. 04/15/13  Yes [name], MD  Powders (ANTI MONKEY BUTT) POWD Apply 1  application topically as needed (burning itching drying up). Apply to legs and rub in   Yes Historical Provider, MD  ranitidine (ZANTAC) 150 MG tablet 150 mg 2 (two) times daily.  04/11/12  Yes Historical Provider, MD  repaglinide (PRANDIN) 2 MG tablet Take 2 mg by mouth 2 (two) times daily.    Yes Historical Provider, MD  rosuvastatin (CRESTOR) 20 MG tablet Take 1 tablet (20 mg total) by mouth at bedtime. 01/24/12 07/04/13 Yes Renella Cunas, MD  sertraline (ZOLOFT)  50 MG tablet Take 50 mg by mouth every morning.   Yes Historical Provider, MD  Vitamin D, Ergocalciferol, (DRISDOL) 50000 UNITS CAPS Take 50,000 Units by mouth every 7 (seven) days. Take every week on saturday   Yes Historical Provider, MD    Allergies  Allergen Reactions  . Adhesive [Tape] Rash    blistered  . Celecoxib Rash    ROS:  General: no fevers/chills/night sweats, positive for generalized fatigue Eyes: no blurry vision, diplopia, or amaurosis ENT: no sore throat or hearing loss Resp: no cough, wheezing, or hemoptysis, positive for dyspnea CV: See history of present illness GI: no abdominal pain, nausea, vomiting, diarrhea, or constipation GU: no dysuria, frequency, or hematuria Skin: no rash Neuro: no headache, numbness, tingling, or weakness of extremities Musculoskeletal: no joint pain or swelling Heme: no bleeding, DVT, or easy bruising Endo: no polydipsia or polyuria  BP 126/58  Pulse 73  Resp 20  Ht 5\' 10"  (1.778 m)  Wt 236 lb 12.8 oz (107.412 kg)  BMI 33.98 kg/m2  SpO2 96%  PHYSICAL EXAM: Pt is alert and oriented, WD, WN, in no distress. HEENT: normal Neck: JVP difficult to visualize but appears elevated. Carotid upstrokes normal   Lungs: equal expansion, clear bilaterally CV: Regular rate and rhythm with grade 3/6 harsh percent a decrescendo murmur at the upper sternal border Abd: soft, NT, +BS, no bruit, no hepatosplenomegaly Back: no CVA tenderness Ext: 1+ pretibial edema bilaterally, stasis  ulcers noted bilaterally. Neuro: CNII-XII intact             Strength intact = bilaterally  2D ECHO:  Left ventricle: The cavity size was normal. Wall thickness was increased in a pattern of moderate LVH. Systolic function was moderately reduced. The estimated ejection fraction was in the range of 35% to 40%. Diffuse hypokinesis. Early diastolic septal annular tissue Doppler velocities Ea were abnormal.  ------------------------------------------------------------ Aortic valve: Moderately to severely calcified annulus. Severely thickened leaflets. Doppler: There was severe stenosis. Valve area: 0.97cm^2(VTI). Indexed valve area: 0.43cm^2/m^2 (VTI). Peak velocity ratio of LVOT to aortic valve: 0.32. Valve area: 1.01cm^2 (Vmax). Indexed valve area: 0.45cm^2/m^2 (Vmax). Mean gradient: 97mm Hg (S). Peak gradient: 39mm Hg (S).  ------------------------------------------------------------ Aorta: Aortic root: The aortic root was normal in size. Ascending aorta: The ascending aorta was normal in size.  ------------------------------------------------------------ Mitral valve: Moderately thickened, severely calcified leaflets . Doppler: The findings are consistent with moderate stenosis. Valve area by pressure half-time: 1.29cm^2. Indexed valve area by pressure half-time: 0.58cm^2/m^2. Valve area by continuity equation (using LVOT flow): 0.9cm^2. Indexed valve area by continuity equation (using LVOT flow): 0.4cm^2/m^2. Mean gradient: 43mm Hg (D). Peak gradient: 58mm Hg (D).  ------------------------------------------------------------ Left atrium: The atrium was moderately to severely dilated.  ------------------------------------------------------------ Right ventricle: The cavity size was mildly dilated. Systolic function was mildly reduced.  ------------------------------------------------------------ Pulmonic valve: The valve appears to be grossly normal. Doppler: Mild  regurgitation.  ------------------------------------------------------------ Tricuspid valve: Structurally normal valve. Doppler: Trivial regurgitation.  ------------------------------------------------------------ Right atrium: The atrium was moderately dilated.  ------------------------------------------------------------ Pericardium: There was no pericardial effusion.   CARDIAC CATH: 03/20/2013: Procedure: Right Heart Cath, Left Heart Cath, Selective Coronary Angiography, LIMA angiography, SVG angiography  Indication: CHF, Rheumatic aortic and mitral stenosis  Procedural Details: The right groin was prepped, draped, and anesthetized with 1% lidocaine. Using the modified Seldinger technique a 5 French sheath was placed in the right femoral artery and a 7 French sheath was placed in the right femoral vein. A Swan-Ganz catheter was used for  the right heart catheterization. Standard protocol was followed for recording of right heart pressures and sampling of oxygen saturations. Fick cardiac output was calculated. Standard Judkins catheters were used for selective coronary angiography. An AL-1 catheter was used to direct an exchange length straight tip wire across the aortic valve and this was changed out for a pigtail catheter. Simultaneous LV and pulmonary capillary wedge pressures were recorded to evaluate mitral stenosis. A pullback across the aortic valve was done to evaluate aortic stenosis. Ventriculography was deferred because of chronic kidney disease. Saphenous vein graft angiography was performed with the JR 4 catheter. LIMA angiography was performed of the LIMA catheter. There were no immediate procedural complications. The patient was transferred to the post catheterization recovery area for further monitoring.  Procedural Findings:  Hemodynamics  RA A wave 19, V wave 17, mean 14  RV 96/18  PA 98/34 with a mean of 56  PCWP A wave 28, V wave 31, mean 27  LV 167/23  AO 118/61 with a  mean of 84  Oxygen saturations:  PA 56  AO 91  Cardiac Output (Fick) 5.0 L per min  Cardiac Index (Fick) 2.2 L per minute per meter squared  Aortic valve hemodynamics: The peak gradient 49 mm mercury, mean gradient 37 mm mercury, aortic valve area 1.0 cm.  Mitral valve hemodynamics: Mean gradient 11, mitral valve area 1.16 cm    Coronary angiography:  Coronary dominance: right  Left mainstem: The left main is severely calcified. The vessel has 90% distal stenosis.  Left anterior descending (LAD): The LAD is occluded at the first septal perforator. The entirety of the LAD is severely calcified always to the LV apex.  Left circumflex (LCx): The left circumflex is totally occluded. There is a sequential vein graft supplying the first and second OM branches with continued patency  Right coronary artery (RCA): The RCA is severely calcified throughout its entirety. The vessel is stented with continued stent patency. There is mild in-stent restenosis in the mid and distal vessel. There is diffuse disease throughout the proximal, mid, and distal RCA, but no more than 40-50% stenosis noted. The PDA and PLA branches have no evidence of high-grade stenosis.  Saphenous vein graft sequential to OM1 and OM 2: Widely patent throughout  LIMA sequential to second diagonal and LAD: The diagonal is patent beyond the LIMA anastomosis with 70% stenosis noted as it divides into twin vessels. The diagonal is small through this region. The sequential limb to the LAD is patent. However, the LAD is totally occluded at the anastomotic site. Previously the vessel was severely diseased throughout.  Left ventriculography: Deferred because of chronic kidney disease   Final Conclusions:  1. Severe aortic stenosis  2. Moderate to severe mitral stenosis  3. Severe native three-vessel disease with total occlusion of the LAD, total occlusion left circumflex, and continued patency of the severely calcified, moderately diseased  dominant RCA  4. Status post aortocoronary bypass surgery with continued patency of the saphenous vein graft sequential to OM1 and OM 2, known occlusion of the saphenous vein graft to RCA branches, and continued patency of the LIMA sequential to diagonal 2 and the mid LAD. However, total occlusion of the mid LAD just beyond the LIMA anastomosis.  5. Severe pulmonary hypertension.   CTA Heart 06/04/2012: Findings:  Aortic Valve: Tri-leaflet and heavily calcified including base of  leaflets. Extensive calcification extending from the base of the  left coronary cusp through the intervalvular fibrosa to the mitral  annulus. The mitral annulus is severely calcified as well  Virtual Basal Annulus: Difficult to measure due to extensive  calcification. 40% phase used  Max/Min Diameter: 54mm/21mm  Area: 525cm2 inclusive of calcium ( Note Dr Weber Cooks measures  512cm2 and 464cm2 excluding calcium)  Perimeter: 2mm  Aorta: No aneurysmal dilatation or dissection. Minor calcification  on the minor surface of the arch and left subclavian take-off  Normal arch vessel origins  Ascending Aorta: 3.5 cm  STJ: 2.7 cm  Sinus Measurements:  NCS: 46mm  RCS: 30 mm RCA height above valve plane 13 mm  LCS: 33 mm LM height above valve plane 15 mm  Coronary Arteries:  LM- extremely calcified with distal 80% stenosis  LAD- occluded in mid vessel with patent LIMA  Circumflex- occluded proximally  RCA: extremely calcified with patent stent in mid vessel  SVG RCA- occluded in mid vessel  SVG IM- patent  Lima LAD- patent  Impression:  1) Severely calcified trileaflet aortic valve. Unfavorable  characteristics for TAVR include heavy calcification at the annulus  extending from the left cusp into the intervalvular fibrosa and MAC  with known mitral stenosis  2) Annular measurements noted above suitable for a 58mm Sapien  Valve  3) No arch abnormalities that would affect transfemoral delivery  4) Best  angiographic delivery angle LAO 24 degrees Cranial 11  degrees  5) Severe native CAD with occluded SVG to RCA and patent SVG to  OM and LIMA to LAD  Jenkins Rouge MD [facility]   CTA Abdomen/Pelvis: VASCULAR MEASUREMENTS PERTINENT TO TAVR:  AORTA:  Minimal Aortic Diameter - 15 x 14 mm  Severity of Aortic Calcification - mild  RIGHT PELVIS:  Right Common Iliac Artery -  Minimal Diameter - 11.3 x 8.8 mm  Tortuosity - mild  Calcification - mild  Right External Iliac Artery -  Minimal Diameter - 10.3 x 10.2 mm  Tortuosity - moderate  Calcification - mild  Right Common Femoral Artery -  Minimal Diameter - 10.1 x 8.8 mm  Tortuosity - mild  Calcification - mild  LEFT PELVIS:  Left Common Iliac Artery -  Minimal Diameter - 9.2 x 10.7 mm  Tortuosity - mild  Calcification - mild  Left External Iliac Artery -  Minimal Diameter - 10.4 x 12.0 mm  Tortuosity - moderate  Calcification - none  Left Common Femoral Artery -  Minimal Diameter - 9.8 x 10.5 mm  Tortuosity - mild  Calcification - mild   RISK SCORES Procedure: AV Replacement + CAB Risk of Mortality: 9.721% Morbidity or Mortality: 40.16%  Long Length of Stay: 20.061% Short Length of Stay: 17.106% Permanent Stroke: 2.898% Prolonged Ventilation: 28.291% DSW Infection: 0.798% Renal Failure: 16.581% Reoperation: 13.569%    ASSESSMENT AND PLAN:  73 year old gentleman with severe symptomatic aortic stenosis and New York Heart Association class III congestive heart failure. Multiple comorbid conditions include severe pulmonary hypertension, severe diffuse coronary artery disease without revascularization options, severe calcification extending from the aortic valve annulus into the intervalvular fibrosa and mitral valve annulus, and stage III chronic kidney disease.  His STS risk with AVR/CABG is 9.7%. The STS risk calculator does not calculate risk for aortic and mitral valve replacement, but per Dr Guy Sandifer previous assessment this  patient's risk would far exceed an operative mortality of 15% and major morbidity of 50%.  We have carefully reviewed all of his imaging studies, hemodynamic assessment by cardiac catheterization, and cardiac cath films. We have conferred with colleagues at tertiary care centers  regarding potential treatment options. We've had extensive discussion with the patient and his wife. We agree that double valve surgery carries prohibitive risk in this gentleman with multiple severe comorbidities as outlined above. While TAVR may have some technical limitations related to heavy calcification of his aortic valve annulus and risk of significant perivalvular aortic insufficiency, this made be his best treatment option. The fact that he had marked improvement clinically after balloon aortic valvuloplasty weighs heavily in our decision-making. We have reviewed all of these considerations with the patient who is eager to proceed with treatment. He has clearly failed all attempts at medical therapy and has had progressive symptoms of heart failure now on multiple occasions despite close outpatient followup. We have reviewed the specific risks, indications, and alternatives to transcatheter aortic valve replacement. He appears to have suitable vascular access for a transfemoral approach. He understands the specific risks of bleeding, infection, stroke, myocardial infarction, cardiac perforation, tamponade, prosthetic valve infection, need for permanent pacemaker, valve embolization, aortic dissection, aortic annular rupture, and death. He also understands that based on his specific anatomy, his risk perivalvular aortic insufficiency is higher than normal. We discussed considerations if a catastrophic complication were to occur. He would not be a candidate for emergency sternotomy, but could be considered for short-term cardiopulmonary bypass in order to stabilize his hemodynamics.  The patient and his wife understand all these  issues and agreed to proceed. Will tentatively schedule for surgery on April 14th.  I asked the patient to continue on his current diuretic regimen and will see him back in follow-up on April 10th prior to surgery.   [name] 05/22/2013 5:52 PM

## 2013-05-22 NOTE — Progress Notes (Addendum)
[facility]       CARDIOTHORACIC SURGERY NOTE  Referring Provider is [name], MD PCP is [name], MD   HPI:  Patient returns for followup of rheumatic aortic stenosis, mitral stenosis, chronic diastolic congestive heart failure with severe pulmonary hypertension, and severe ischemic heart disease.  I last saw him in the office on 03/30/2013.  He was feeling somewhat better at that time, and we made a decision to continue to follow him closely as long as he was not having any worsening of symptoms of congestive heart failure.  He returns to the valve clinic this afternoon for followup and reports that over the past 2 months he has definitely slowly gotten worse. He has developed worsening exertional shortness of breath and lower extremity edema. He now get short of breath with relatively mild activity and this is starting to limit his ordinary day to day activities. He is not having any resting shortness of breath but he does have some difficulty sleeping and orthopnea. He has never had any chest pain or chest tightness. He is not having any dizzy spells.  Symptoms have progressed to the point where there he are nearly as bad as they were last spring before he underwent balloon aortic valvuloplasty.    Current Outpatient Prescriptions  Medication Sig Dispense Refill  . aspirin EC 81 MG tablet Take 81 mg by mouth daily.      . Coenzyme Q10 300 MG CAPS Take 300 mg by mouth daily.       Marland Kitchen ezetimibe (ZETIA) 10 MG tablet Take 1 tablet (10 mg total) by mouth every evening.  30 tablet  6  . fenofibrate micronized (ANTARA) 130 MG capsule Take 1 capsule (130 mg total) by mouth every evening.  30 capsule  6  . fish oil-omega-3 fatty acids 1000 MG capsule Take 1 g by mouth 2 (two) times daily.       . furosemide (LASIX) 80 MG tablet Take 1 tablet (80 mg total) by mouth 2 (two) times daily.  60 tablet  5  . insulin glargine (LANTUS) 100  UNIT/ML injection Inject 70 Units into the skin 3 (three) times daily.       Marland Kitchen JANUVIA 100 MG tablet Take 100 mg by mouth at bedtime.       Marland Kitchen LOVAZA 1 G capsule Take 1 g by mouth 2 (two) times daily.       . metFORMIN (GLUCOPHAGE) 1000 MG tablet Take 1,000 mg by mouth 2 (two) times daily with a meal.      . methocarbamol (ROBAXIN) 500 MG tablet Take 500 mg by mouth daily as needed (leg cramps).      . metoprolol tartrate (LOPRESSOR) 12.5 mg TABS tablet Take 0.5 tablets (12.5 mg total) by mouth 2 (two) times daily.  30 tablet  3  . nitroGLYCERIN (NITROSTAT) 0.4 MG SL tablet Place 1 tablet (0.4 mg total) under the tongue every 5 (five) minutes as needed for chest pain. May repeat up to 3 times as needed  30 tablet  5  . OVER THE COUNTER MEDICATION Apply 1 application topically 2 (two) times daily. Cream used for itching. cream includes camphor.      . pantoprazole (PROTONIX) 40 MG tablet Take 40 mg by mouth every morning.      . Potassium Chloride ER 20 MEQ TBCR Take 20 mEq by mouth 2 (two) times daily.  60 tablet  5  . Powders (  ANTI MONKEY BUTT) POWD Apply 1 application topically as needed (burning itching drying up). Apply to legs and rub in      . ranitidine (ZANTAC) 150 MG tablet 150 mg 2 (two) times daily.       . repaglinide (PRANDIN) 2 MG tablet Take 2 mg by mouth 2 (two) times daily.       . rosuvastatin (CRESTOR) 20 MG tablet Take 1 tablet (20 mg total) by mouth at bedtime.  30 tablet  6  . sertraline (ZOLOFT) 50 MG tablet Take 50 mg by mouth every morning.      . Vitamin D, Ergocalciferol, (DRISDOL) 50000 UNITS CAPS Take 50,000 Units by mouth every 7 (seven) days. Take every week on saturday      . [DISCONTINUED] promethazine (PHENERGAN) 25 MG tablet Take 1/2 to 1 tablet up to every 6 hours as needed for nausea.        No current facility-administered medications for this encounter.      Physical Exam:   SpO2 %  General:  Well appearing  Chest:   clear  CV:   RRR w/ grade III/VI  cresceno/decrescendo systolic murmur RUSB  Incisions:  n/a  Abdomen:  soft  Extremities:  + bilateral LE edema w/ chronic venous stasis ulcerations  Diagnostic Tests:  n/a   Impression:  Patient has severe symptomatic aortic stenosis associated with progressive symptoms of congestive heart failure, now consistent with New York Heart Association functional class III. He has severe three-vessel coronary artery disease with vein graft disease status post coronary artery bypass grafting in the distant past. He does not have suitable target vessels for repeat surgical revascularization. He also has at least moderate mitral stenosis and severe pulmonary hypertension with PA pressures approaching systemic pressure.  Other comorbidities include stage III chronic kidney disease and obesity.  Risks associated with conventional surgical aortic valve replacement and mitral valve replacement with or without redo coronary artery bypass grafting would unquestionably be extremely high and are further adversely affected by the presence of extremely severe calcification in the mitral valve and intervalvular fibrosa.  I feel the predicted risk of mortality with conventional surgery would clearly be greater than 15%, probably somewhere closer to 50%.  Last year the patient experienced dramatic improvement in symptoms of congestive heart failure following balloon aortic valvuloplasty. Transcatheter aortic valve replacement may prove to be a reasonable alternative for treating his recurrent severe aortic stenosis.  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    Plan:  The patient is seen in conjunction with Dr. [name] in the multidisciplinary heart valve clinic.  Treatment options were discussed at  length and all their questions have been answered. We tentatively plan to proceed with transcatheter aortic valve replacement via open transfemoral approach on Tuesday, April 14.    Following the decision to proceed with transcatheter aortic valve replacement, a discussion has been held regarding what types of management strategies would be attempted intraoperatively in the event of life-threatening complications, including whether or not the patient would be considered a candidate for the use of cardiopulmonary bypass and/or conversion to open sternotomy for attempted surgical intervention.  The patient has been advised of a variety of complications that might develop including but not limited to risks of death, stroke, paravalvular leak, aortic dissection or other major vascular  complications, aortic annulus rupture, device embolization, cardiac rupture or perforation, mitral regurgitation, acute myocardial infarction, arrhythmia, heart block or bradycardia requiring permanent pacemaker placement, congestive heart failure, respiratory failure, renal failure, pneumonia, infection, other late complications related to structural valve deterioration or migration, or other complications that might ultimately cause a temporary or permanent loss of functional independence or other long term morbidity.  The patient provides full informed consent for the procedure as described and all questions were answered.   I spent in excess of 30 minutes during the conduct of this office consultation and >50% of this time involved direct face-to-face encounter with the patient for counseling and/or coordination of their care.   Valentina Gu. Roxy Manns, MD 05/22/2013 2:44 PM

## 2013-05-26 ENCOUNTER — Ambulatory Visit: Payer: Medicare Other | Admitting: Cardiovascular Disease

## 2013-06-02 ENCOUNTER — Other Ambulatory Visit: Payer: Self-pay | Admitting: *Deleted

## 2013-06-02 DIAGNOSIS — I359 Nonrheumatic aortic valve disorder, unspecified: Secondary | ICD-10-CM

## 2013-06-03 ENCOUNTER — Ambulatory Visit: Payer: Medicare Other | Attending: Thoracic Surgery (Cardiothoracic Vascular Surgery) | Admitting: Physical Therapy

## 2013-06-03 ENCOUNTER — Encounter: Payer: Self-pay | Admitting: Surgery

## 2013-06-03 ENCOUNTER — Institutional Professional Consult (permissible substitution) (INDEPENDENT_AMBULATORY_CARE_PROVIDER_SITE_OTHER): Payer: Medicare Other | Admitting: Surgery

## 2013-06-03 ENCOUNTER — Other Ambulatory Visit: Payer: Self-pay | Admitting: *Deleted

## 2013-06-03 VITALS — BP 110/78 | HR 78 | Resp 20 | Ht 70.0 in | Wt 236.0 lb

## 2013-06-03 DIAGNOSIS — I359 Nonrheumatic aortic valve disorder, unspecified: Secondary | ICD-10-CM | POA: Insufficient documentation

## 2013-06-03 DIAGNOSIS — I059 Rheumatic mitral valve disease, unspecified: Secondary | ICD-10-CM

## 2013-06-03 DIAGNOSIS — IMO0001 Reserved for inherently not codable concepts without codable children: Secondary | ICD-10-CM | POA: Insufficient documentation

## 2013-06-03 DIAGNOSIS — I350 Nonrheumatic aortic (valve) stenosis: Secondary | ICD-10-CM

## 2013-06-03 DIAGNOSIS — Z951 Presence of aortocoronary bypass graft: Secondary | ICD-10-CM

## 2013-06-03 DIAGNOSIS — R269 Unspecified abnormalities of gait and mobility: Secondary | ICD-10-CM | POA: Insufficient documentation

## 2013-06-04 ENCOUNTER — Encounter: Payer: Self-pay | Admitting: Surgery

## 2013-06-04 NOTE — Progress Notes (Signed)
Patient ID: [name], male   DOB: Aug 22, 1940, 73 y.o.   MRN: [id number]   [facility] CONSULTATION REPORT  Referring Provider is [name], MD PCP is [name], MD  Chief Complaint  Patient presents with  . 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 Redmond Pulling in 1998. Grafts placed at the time of surgery included left internal mammary artery placed sequentially to the diagonal and the distal left anterior descending coronary artery, saphenous vein graft placed sequentially to the first and second obtuse marginal branch of left circumflex coronary artery, and saphenous vein graft placed sequentially to the posterior descending coronary artery and the right posterolateral branch.  He subsequently underwent PCI and stenting 2 years later and did well until the past few years when he has developed gradual progression of exertional shortness of breath and chest tightness. An echo in Nov 2013 showed moderate to severe AS and moderate to severe MS with preserved LV function. He underwent right and left heart cath showing severe AS with severe native coronary artery disease and severe vein graft disease as well as severe pulmonary hypertension. He was referred to Dr. Roxy Manns in January 2014 for consideration of high risk surgery. There was concern that he had severe diffuse coronary disease that was not amenable to redo CABG. He had a TEE on 03/20/2012 which showed severe AS and moderate to severe MS with a mean gradient of 9 mm Hg and a MVA of 0.9 cm2. He also had severe calcification of the mitral annulus and intervalvular region. He wanted to continue aggressive treatment of his disease and therefore underwent a BAV on 04/28/2012 by Dr.  [name] with a reduction in the mean gradient from 47 to 27 mm Hg. Then on 05/07/2012 he underwent rotational atherectomy and stenting of the mid and distal RCA with BMS. Right heart cath showed severe pulmonary hypertension with a PAP of 97/32 with a residual mean gradient of 20 mm Hg across the AV and a mean gradient of 11 mm Hg across the MV. He did reasonably well for a while but follow up echos in Nov 2014 and Jan 2015 showed an increasing gradient across the AV and he presented in mid January with a severe presyncopal episode with profound weakness and was admitted. Repeat right and left heart cath showed severe pulmonary hypertension again with PAP of 98/34, a mean gradient across the aortic valve of 37 and an AVA of 1.0 cm2 with a mean gradient across the MV of 11. Coronary angiography again showed severe native 3-vessel CAD with continued stent patency in the RCA, and continued SVG patency except the known occlusion of the graft to the RCA. Over the past 2 months he has developed progressive exertional shortness of breath and lower extremity edema. He gets short of breath and very fatigued with mild activity, bending over, and lifting anything. He denies any symptoms at rest but is tired most of the time. He has no orthopnea or dizziness.  Past Medical History  Diagnosis Date  . GERD (gastroesophageal reflux disease)   . Hyperlipidemia   . Hypertension   . Diabetes mellitus   . Carotid artery disease   . Cellulitis and abscess  of leg, except foot   . Leg edema   . Right knee sprain   . Cramps, muscle, general   . Low back pain   . Hiatal hernia   . Chills   . Hearing loss   . Nasal congestion   . Cough   . Wheezing   . Generalized headaches   . CAD (coronary artery disease) 11/05/1996    a. CABG x6 by Dr Andrey Campanile - LIMA to Diag+LAD, SVG to OM1+OM2, SVG to PDA+RPL, b. VG->RCA known to be occluded;  c. 05/2012 PCI/Rota/BMS to m/dRCA w/ 3.5x15 Vision BMS x 2 post-dil to 3.75.  Marland Kitchen Mitral stenosis      a. moderate by echo 04/2012.  Marland Kitchen Chronic diastolic congestive heart failure     a. 04/2012 Echo: EF 55-60-%  . Aortic stenosis     a. 04/2012 s/p baloon valvuloplasty w/ reduction in gradient from 47 to 27.  . Pulmonary hypertension     a. 05/2012 RH cath: PA 97/32(54)  . Venous insufficiency (chronic) (peripheral)   . Sleep apnea     no cpap  sleep study >5 yrs  . Shortness of breath     Past Surgical History  Procedure Laterality Date  . Ett cardiolite  12/31/2006    Mild-Mod distal inf/apical ischemia  . Open heart surgery    . Acute or chronic diastolic hf  8/6/ - 10/11/2008    RLE cellulitis - HOSP  . Doppler echocardiography  10/11/2008    Mild AS, Mild-Mod MS, Severe LVH  . Le arterial and venous US  10/11/2008    Art clear.  Venous Neg DVT  . Appendectomy  12/25/2010    Dr Andrey Campanile  . Umbilical hernia repair  12/25/2010    Dr Andrey Campanile  . Hernia repair  12/25/10    umbilical hernia repair   . Tee without cardioversion  03/20/2012    Procedure: TRANSESOPHAGEAL ECHOCARDIOGRAM (TEE);  Surgeon: Laurey Morale, MD;  Location: [facility] ENDOSCOPY;  Service: Cardiovascular;  Laterality: N/A;  . Cardiac catheterization      Family History  Problem Relation Age of Onset  . Diabetes Other     History   Social History  . Marital Status: Married    Spouse Name: N/A    Number of Children: N/A  . Years of Education: N/A   Occupational History  . Not on file.   Social History Main Topics  . Smoking status: Never Smoker   . Smokeless tobacco: Never Used  . Alcohol Use: No  . Drug Use: No  . Sexual Activity: Not on file   Other Topics Concern  . Not on file   Social History Narrative  . No narrative on file    Current Outpatient Prescriptions  Medication Sig Dispense Refill  . aspirin EC 81 MG tablet Take 81 mg by mouth daily.      . Coenzyme Q10 300 MG CAPS Take 300 mg by mouth daily.       Marland Kitchen ezetimibe (ZETIA) 10 MG tablet Take 1 tablet (10 mg total) by mouth every evening.   30 tablet  6  . fenofibrate micronized (ANTARA) 130 MG capsule Take 1 capsule (130 mg total) by mouth every evening.  30 capsule  6  . fish oil-omega-3 fatty acids 1000 MG capsule Take 1 g by mouth 2 (two) times daily.       . furosemide (LASIX) 80 MG tablet Take 80 mg by mouth 2 (two) times daily. 2 tabs in  AM and 1 tab in PM      . insulin glargine (LANTUS) 100 UNIT/ML injection Inject 70 Units into the skin 3 (three) times daily.       Marland Kitchen JANUVIA 100 MG tablet Take 100 mg by mouth at bedtime.       Marland Kitchen LOVAZA 1 G capsule Take 1 g by mouth 2 (two) times daily.       . metFORMIN (GLUCOPHAGE) 1000 MG tablet Take 1,000 mg by mouth 2 (two) times daily with a meal.      . methocarbamol (ROBAXIN) 500 MG tablet Take 500 mg by mouth daily as needed (leg cramps).      . metoprolol tartrate (LOPRESSOR) 12.5 mg TABS tablet Take 0.5 tablets (12.5 mg total) by mouth 2 (two) times daily.  30 tablet  3  . nitroGLYCERIN (NITROSTAT) 0.4 MG SL tablet Place 1 tablet (0.4 mg total) under the tongue every 5 (five) minutes as needed for chest pain. May repeat up to 3 times as needed  30 tablet  5  . OVER THE COUNTER MEDICATION Apply 1 application topically 2 (two) times daily. Cream used for itching. cream includes camphor.      . pantoprazole (PROTONIX) 40 MG tablet Take 40 mg by mouth every morning.      . Potassium Chloride ER 20 MEQ TBCR Take 20 mEq by mouth 2 (two) times daily.  60 tablet  5  . Powders (ANTI MONKEY BUTT) POWD Apply 1 application topically as needed (burning itching drying up). Apply to legs and rub in      . ranitidine (ZANTAC) 150 MG tablet 150 mg 2 (two) times daily.       . repaglinide (PRANDIN) 2 MG tablet Take 2 mg by mouth 2 (two) times daily.       . rosuvastatin (CRESTOR) 20 MG tablet Take 1 tablet (20 mg total) by mouth at bedtime.  30 tablet  6  . sertraline (ZOLOFT) 50 MG tablet Take 50 mg by mouth every morning.      . Vitamin D, Ergocalciferol, (DRISDOL) 50000 UNITS CAPS Take 50,000  Units by mouth every 7 (seven) days. Take every week on saturday      . [DISCONTINUED] promethazine (PHENERGAN) 25 MG tablet Take 1/2 to 1 tablet up to every 6 hours as needed for nausea.        No current facility-administered medications for this visit.    Allergies  Allergen Reactions  . Adhesive [Tape] Rash    blistered  . Celecoxib Rash      Review of Systems:   General:  good appetite, decreased energy, some weight gain, no weight loss, no fever  Cardiac:  no chest pain with exertion, no chest pain at rest, marked SOB with mild exertion, no resting SOB, no PND, no orthopnea, no palpitations, no arrhythmia, no atrial fibrillation, marked LE edema, no dizzy spells, no syncope  Respiratory:  exertional shortness of breath, no home oxygen, no productive cough, no dry cough, no bronchitis, no wheezing, no hemoptysis, no asthma, no pain with inspiration or cough, no sleep apnea, no CPAP at night  GI:   no difficulty swallowing, positive reflux, positive frequent heartburn, unknown hiatal hernia, no abdominal pain, no constipation, no diarrhea, no hematochezia, no hematemesis, no melena  GU:   no dysuria,  positive frequency, no urinary tract infection, no hematuria, no enlarged prostate, no kidney stones, no kidney disease  Vascular:  no pain suggestive of claudication, no pain in feet,  has leg cramps, no varicose veins, no DVT, has non-healing leg ulcers worse on the right than the left  Neuro:   no stroke, no TIA's, no seizures, has headaches, temporary blindness right eye,  no slurred speech, has peripheral neuropathy, no chronic pain, no instability of gait, no memory/cognitive dysfunction  Musculoskeletal: no arthritis, no joint swelling, no myalgias, mild difficulty walking, mildly limited mobility   Skin:   chronic rash both lower legs from venous insufficiency, no itching, positive skin infections, positive pressure sores or ulcerations in both lower legs  Psych:   no anxiety, no  depression, no nervousness, positive unusual recent stress  Eyes:   positive blurry vision, positive floaters, positive recent vision changes,  wears glasses or contacts  ENT:   positive hearing loss, no loose or painful teeth, no dentures, last saw dentist 2014  Hematologic:  no easy bruising, positive abnormal bleeding, no clotting disorder, no frequent epistaxis  Endocrine:  positive diabetes, does check CBG's at home. Last Hgb A1c was 7.8 on 03/19/2013           Physical Exam:   BP 110/78  Pulse 78  Resp 20  Ht 5\' 10"  (1.778 m)  Wt 236 lb (107.049 kg)  BMI 33.86 kg/m2  SpO2 95%  General: Obese but otherwise fairly well-appearing  HEENT: Unremarkable  Neck: no JVD, no bruits, no adenopathy  Chest: Few bibasilar inspiratory crackles, symmetrical breath sounds, no wheezes, no rhonchi  CV: RRR, grade III/VI systolic murmur best LSB  Abdomen: soft, non-tender, no masses, no obvious ascites  Extremities: warm, well-perfused, pulses not palpable, + severe bilateral LE edema, chronic skin changes from venous insufficiency and diabetes both lower legs with a few areas of skin breakdown but no active cellulitis  Rectal/GU Deferred  Neuro: Grossly non-focal and symmetrical throughout  Skin: Clean and dry other than lower legs, no rashes     Diagnostic Tests:  *[place]* *[facility]* [address], [place] [address] [phone]  ------------------------------------------------------------ Transthoracic Echocardiography  Patient: Tralyn, Benitez MR #: [id number] Study Date: 03/21/2013 Gender: M Age: 78 Height: 177.8cm Weight: 106.1kg BSA: 2.29m^2 Pt. Status: Room: [facility]  ADMITTING [name] ATTENDING Catalina Antigua, Gala Lewandowsky SONOGRAPHER Wyatt Mage, RDCS PERFORMING [facility], Inpatient cc:  ------------------------------------------------------------ LV EF: 35% -  40%  ------------------------------------------------------------ Indications: Aortic stenosis 424.1.  ------------------------------------------------------------ History: PMH: Dyspnea. Coronary artery disease. Congestive heart failure. Aortic stenosis. Mitral stenosis. Risk factors: Hyperpotassemia. PVD. GERD. Hiatal hernia. Gastroenteritis. Cellulitis. Low back pain. Sleep apnea. Leg edema. Carotid bruit. Diarrhea. Appendicitis. Actinic keratosis. Cough. URI. Rheumatic heart disease. Squamous cell carcinoma. Hypertension. Diabetes mellitus. Obese. Dyslipidemia.  ------------------------------------------------------------ Study Conclusions  - Left ventricle: The cavity size was normal. Wall thickness was increased in a pattern of moderate LVH. Systolic function was moderately reduced. The estimated ejection fraction was in the range of 35% to 40%. Diffuse hypokinesis. - Aortic valve: Moderately to severely calcified annulus. Severely thickened leaflets. There was severe stenosis. Valve area: 0.97cm^2(VTI). Valve area: 1.01cm^2 (Vmax). - Mitral valve: The findings are consistent with moderate stenosis. Valve area by pressure half-time: 1.29cm^2. Valve area by continuity equation (using LVOT flow): 0.9cm^2. - Left atrium: The atrium was moderately to severely dilated. - Right ventricle: The cavity size was mildly dilated. Systolic function was mildly reduced. - Right atrium: The atrium was moderately dilated.  ------------------------------------------------------------ Labs, prior tests, procedures, and surgery: Coronary artery bypass grafting.  Transthoracic echocardiography. M-mode, complete 2D, spectral Doppler, and color  Doppler. Height: Height: 177.8cm. Height: 70in. Weight: Weight: 106.1kg. Weight: 233.5lb. Body mass index: BMI: 33.6kg/m^2. Body surface area: BSA: 2.64m^2. Blood pressure: 109/62. Patient status: Inpatient. Location:  Bedside.  ------------------------------------------------------------  ------------------------------------------------------------ Left ventricle: The cavity size was normal. Wall thickness was increased in a pattern of moderate LVH. Systolic function was moderately reduced. The estimated ejection fraction was in the range of 35% to 40%. Diffuse hypokinesis. Early diastolic septal annular tissue Doppler velocities Ea were abnormal.  ------------------------------------------------------------ Aortic valve: Moderately to severely calcified annulus. Severely thickened leaflets. Doppler: There was severe stenosis. Valve area: 0.97cm^2(VTI). Indexed valve area: 0.43cm^2/m^2 (VTI). Peak velocity ratio of LVOT to aortic valve: 0.32. Valve area: 1.01cm^2 (Vmax). Indexed valve area: 0.45cm^2/m^2 (Vmax). Mean gradient: 34mm Hg (S). Peak gradient: 63mm Hg (S).  ------------------------------------------------------------ Aorta: Aortic root: The aortic root was normal in size. Ascending aorta: The ascending aorta was normal in size.  ------------------------------------------------------------ Mitral valve: Moderately thickened, severely calcified leaflets . Doppler: The findings are consistent with moderate stenosis. Valve area by pressure half-time: 1.29cm^2. Indexed valve area by pressure half-time: 0.58cm^2/m^2. Valve area by continuity equation (using LVOT flow): 0.9cm^2. Indexed valve area by continuity equation (using LVOT flow): 0.4cm^2/m^2. Mean gradient: 63mm Hg (D). Peak gradient: 69mm Hg (D).  ------------------------------------------------------------ Left atrium: The atrium was moderately to severely dilated.  ------------------------------------------------------------ Right ventricle: The cavity size was mildly dilated. Systolic function was mildly reduced.  ------------------------------------------------------------ Pulmonic valve: The valve appears to be grossly  normal. Doppler: Mild regurgitation.  ------------------------------------------------------------ Tricuspid valve: Structurally normal valve. Doppler: Trivial regurgitation.  ------------------------------------------------------------ Right atrium: The atrium was moderately dilated.  ------------------------------------------------------------ Pericardium: There was no pericardial effusion.  ------------------------------------------------------------  2D measurements Normal Doppler measurements Normal Left ventricle Left ventricle LVID ED, 47.4 mm 43-52 Ea, lat 4.4 cm/s ------ chord, ann, tiss PLAX DP LVID ES, 33.5 mm 23-38 E/Ea, lat 65.2 ------ chord, ann, tiss 3 PLAX DP FS, chord, 29 % >29 Ea, med 3.44 cm/s ------ PLAX ann, tiss LVPW, ED 16.5 mm ------ DP IVS/LVPW 1.03 <1.3 E/Ea, med 83.4 ------ ratio, ED ann, tiss 3 Ventricular septum DP IVS, ED 17 mm ------ LVOT LVOT Peak vel, 138 cm/s ------ Diam, S 20 mm ------ S Area 3.14 cm^2 ------ Peak 8 mm Hg ------ Aorta gradient, Root diam, 34 mm ------ S ED Aortic valve Left atrium Peak vel, 431 cm/s ------ AP dim 57 mm ------ S AP dim 2.56 cm/m^2 <2.2 Mean vel, 308 cm/s ------ index S Vol, S 157 ml ------ VTI, S 99.4 cm ------ Vol index, 70.4 ml/m^2 ------ Mean 43 mm Hg ------ S gradient, S Peak 74 mm Hg ------ gradient, S Area, VTI 0.97 cm^2 ------ Area index 0.43 cm^2/m ------ (VTI) ^2 Peak vel 0.32 ------ ratio, LVOT/AV Area, Vmax 1.01 cm^2 ------ Area index 0.45 cm^2/m ------ (Vmax) ^2 Mitral valve Peak E vel 287 cm/s ------ Peak A vel 232 cm/s ------ Mean vel, 196 cm/s ------ D Decelerati 673 ms 150-23 on time 0 Pressure 170 ms ------ half-time Mean 17 mm Hg ------ gradient, D Peak 34 mm Hg ------ gradient, D Peak E/A 1.2 ------ ratio Area (PHT) 1.29 cm^2 ------ Area index 0.58 cm^2/m ------ (PHT) ^2 Area 0.9 cm^2 ------ (LVOT) continuity Area index 0.4 cm^2/m ------ (LVOT  ^2 cont) Annulus 122 cm ------ VTI Systemic veins Estimated 20 mm Hg ------ CVP Right ventricle Sa vel, 5.7 cm/s ------ lat ann, tiss DP  ------------------------------------------------------------ Prepared and Electronically Authenticated by  Mertie Moores 2015-01-17T11:[id number]   Cardiac Catheterization Procedure  Note  Name: Graesyn Ruszczyk.  MRN: [id number]  DOB: 1940-12-24  Procedure: Right Heart Cath, Left Heart Cath, Selective Coronary Angiography, LIMA angiography, SVG angiography  Indication: CHF, Rheumatic aortic and mitral stenosis  Procedural Details: The right groin was prepped, draped, and anesthetized with 1% lidocaine. Using the modified Seldinger technique a 5 French sheath was placed in the right femoral artery and a 7 French sheath was placed in the right femoral vein. A Swan-Ganz catheter was used for the right heart catheterization. Standard protocol was followed for recording of right heart pressures and sampling of oxygen saturations. Fick cardiac output was calculated. Standard Judkins catheters were used for selective coronary angiography. An AL-1 catheter was used to direct an exchange length straight tip wire across the aortic valve and this was changed out for a pigtail catheter. Simultaneous LV and pulmonary capillary wedge pressures were recorded to evaluate mitral stenosis. A pullback across the aortic valve was done to evaluate aortic stenosis. Ventriculography was deferred because of chronic kidney disease. Saphenous vein graft angiography was performed with the JR 4 catheter. LIMA angiography was performed of the LIMA catheter. There were no immediate procedural complications. The patient was transferred to the post catheterization recovery area for further monitoring.  Procedural Findings:  Hemodynamics  RA A wave 19, V wave 17, mean 14  RV 96/18  PA 98/34 with a mean of 56  PCWP A wave 28, V wave 31, mean 27  LV 167/23  AO 118/61 with a mean of 84   Oxygen saturations:  PA 56  AO 91  Cardiac Output (Fick) 5.0 L per min  Cardiac Index (Fick) 2.2 L per minute per meter squared  Aortic valve hemodynamics: The peak gradient 49 mm mercury, mean gradient 37 mm mercury, aortic valve area 1.0 cm.  Mitral valve hemodynamics: Mean gradient 11, mitral valve area 1.16 cm    Coronary angiography:  Coronary dominance: right  Left mainstem: The left main is severely calcified. The vessel has 90% distal stenosis.  Left anterior descending (LAD): The LAD is occluded at the first septal perforator. The entirety of the LAD is severely calcified always to the LV apex.  Left circumflex (LCx): The left circumflex is totally occluded. There is a sequential vein graft supplying the first and second OM branches with continued patency  Right coronary artery (RCA): The RCA is severely calcified throughout its entirety. The vessel is stented with continued stent patency. There is mild in-stent restenosis in the mid and distal vessel. There is diffuse disease throughout the proximal, mid, and distal RCA, but no more than 40-50% stenosis noted. The PDA and PLA branches have no evidence of high-grade stenosis.  Saphenous vein graft sequential to OM1 and OM 2: Widely patent throughout  LIMA sequential to second diagonal and LAD: The diagonal is patent beyond the LIMA anastomosis with 70% stenosis noted as it divides into twin vessels. The diagonal is small through this region. The sequential limb to the LAD is patent. However, the LAD is totally occluded at the anastomotic site. Previously the vessel was severely diseased throughout.  Left ventriculography: Deferred because of chronic kidney disease  Final Conclusions:  1. Severe aortic stenosis  2. Moderate to severe mitral stenosis  3. Severe native three-vessel disease with total occlusion of the LAD, total occlusion left circumflex, and continued patency of the severely calcified, moderately diseased dominant RCA   4. Status post aortocoronary bypass surgery with continued patency of the saphenous vein graft sequential to  OM1 and OM 2, known occlusion of the saphenous vein graft to RCA branches, and continued patency of the LIMA sequential to diagonal 2 and the mid LAD. However, total occlusion of the mid LAD just beyond the LIMA anastomosis.  5. Severe pulmonary hypertension.  [name]  03/20/2013, 2:55 PM    OVER-READ INTERPRETATION - CT CHEST  The following report is an over-read performed by radiologist Dr.  Aubery Lapping. Kearney Hard, M.D. of [facility], [place] on 06/04/2012  15:57:03. This over-read does not include interpretation of  cardiac or coronary anatomy or pathology. The CTA interpretation  by the cardiologist is attached.  Comparison: None  Findings: No filling defect in the visualized opacified pulmonary  arteries. Calcified granulomas noted within the lungs bilaterally.  Calcified right hilar lymph nodes. Findings compatible with old  granulomatous disease. Mildly prominent subcarinal lymph node  measuring 17 mm in short axis diameter. Other than the calcified  granulomas, no confluent opacity in the lungs. No effusions.  Degenerative changes in the thoracic spine. No acute bony  abnormality.  IMPRESSION:  Old granulomatous disease.  Mildly prominent subcarinal lymph nodes. These may be reactive.  This can be followed with repeat CT in 6 months.  Cardiac CTA:  Indication: TAVR  Protocol: The patient was scanned on a Philips 256 slice scanner.  No beta blocker or nitro were used given his severe AS. Average HR  during scan was 66 bpm. A retrospective 120 kV scan was triggered  in the descending thoracic aorta at 111 HU's. 80 cc of contrast  were used. The patient subsequently had an additional 80 cc of  contrast for abdominal and pelvic run-off CTA to assess suitability  for transfemoral approach. The 3D data set was analyzed on a [employer]  [employer] work station using MPR, MIP and VRT  images  Findings:  Aortic Valve: Tri-leaflet and heavily calcified including base of  leaflets. Extensive calcification extending from the base of the  left coronary cusp through the intervalvular fibrosa to the mitral  annulus. The mitral annulus is severely calcified as well  Virtual Basal Annulus: Difficult to measure due to extensive  calcification. 40% phase used  Max/Min Diameter: 62mm/21mm  Area: 525cm2 inclusive of calcium ( Note Dr Llana Aliment measures  512cm2 and 464cm2 excluding calcium)  Perimeter: 51mm  Aorta: No aneurysmal dilatation or dissection. Minor calcification  on the minor surface of the arch and left subclavian take-off  Normal arch vessel origins  Ascending Aorta: 3.5 cm  STJ: 2.7 cm  Sinus Measurements:  NCS: 82mm  RCS: 30 mm RCA height above valve plane 13 mm  LCS: 33 mm LM height above valve plane 15 mm  Coronary Arteries:  LM- extremely calcified with distal 80% stenosis  LAD- occluded in mid vessel with patent LIMA  Circumflex- occluded proximally  RCA: extremely calcified with patent stent in mid vessel  SVG RCA- occluded in mid vessel  SVG IM- patent  Lima LAD- patent  Impression:  1) Severely calcified trileaflet aortic valve. Unfavorable  characteristics for TAVR include heavy calcification at the annulus  extending from the left cusp into the intervalvular fibrosa and MAC  with known mitral stenosis  2) Annular measurements noted above suitable for a 55mm Sapien  Valve  3) No arch abnormalities that would affect transfemoral delivery  4) Best angiographic delivery angle LAO 24 degrees Cranial 11  degrees  5) Severe native CAD with occluded SVG to RCA and patent SVG to  OM and LIMA to LAD  Jenkins Rouge MD [facility]  Original Report Authenticated By: Rolm Baptise, M.D.   *RADIOLOGY REPORT*  Clinical Data: 73 year old male with history of severe aortic  stenosis. Preprocedural study for potential transcatheter aortic  valve replacement (TAVR).    CT ANGIOGRAPHY ABDOMEN AND PELVIS  Technique: Multidetector CT imaging of the abdomen and pelvis was  performed using the standard protocol during bolus administration  of intravenous contrast. Multiplanar reconstructed images  including MIPs were obtained and reviewed to evaluate the vascular  anatomy.  Contrast: 63mL OMNIPAQUE IOHEXOL 350 MG/ML SOLN, 60mL OMNIPAQUE  IOHEXOL 350 MG/ML SOLN  Comparison: CT of the abdomen and pelvis 12/25/2010.  Findings:  VASCULAR MEASUREMENTS PERTINENT TO TAVR:  AORTA:  Minimal Aortic Diameter - 15 x 14 mm  Severity of Aortic Calcification - mild  RIGHT PELVIS:  Right Common Iliac Artery -  Minimal Diameter - 11.3 x 8.8 mm  Tortuosity - mild  Calcification - mild  Right External Iliac Artery -  Minimal Diameter - 10.3 x 10.2 mm  Tortuosity - moderate  Calcification - mild  Right Common Femoral Artery -  Minimal Diameter - 10.1 x 8.8 mm  Tortuosity - mild  Calcification - mild  LEFT PELVIS:  Left Common Iliac Artery -  Minimal Diameter - 9.2 x 10.7 mm  Tortuosity - mild  Calcification - mild  Left External Iliac Artery -  Minimal Diameter - 10.4 x 12.0 mm  Tortuosity - moderate  Calcification - none  Left Common Femoral Artery -  Minimal Diameter - 9.8 x 10.5 mm  Tortuosity - mild  Calcification - mild  ADDITIONAL FINDINGS:  Lung Bases: Multiple small calcified pulmonary nodules compatible  with granulomas.  Abdomen/Pelvis: The appearance of the liver, gallbladder,  pancreas, spleen and bilateral adrenal glands is unremarkable.  There are numerous low attenuation lesions in the kidneys  bilaterally, the majority of which are too small to definitively  characterize. The largest renal lesion measures 2.7 cm in diameter  in the upper pole of the left kidney and is compatible with a  simple cyst. No significant volume of ascites. No  pneumoperitoneum. No pathologic distension of small bowel.  Numerous colonic diverticula are noted,  particularly in the region  of the sigmoid colon, without surrounding inflammatory changes to  suggest acute diverticulitis at this time. No definite pathologic  lymphadenopathy is identified within the abdomen or pelvis. Soft  tissue stranding in the right inguinal region, likely related to  recent cardiac catheterization. Atherosclerosis throughout the  abdominal and pelvic vasculature, without evidence of aneurysm or  dissection, with measurements pertinent to potential TAVR  procedure, as above. Small left inguinal hernia containing only  fat incidentally noted.  Musculoskeletal: There are no aggressive appearing lytic or blastic  lesions noted in the visualized portions of the skeleton.  Review of the MIP images confirms the above findings.  IMPRESSION:  1. Vascular measurements and findings pertinent to potential TAVR  procedure, as detailed above. The patient does appear to have  suitable pelvic arterial access for the procedure.  2. Multiple low attenuation renal lesions bilaterally. The  majority of these are too small to definitively characterize. The  largest lesion is compatible with a simple cyst, and the smaller  lesions are also favored to represent cysts.  3. Colonic diverticulosis without evidence to suggest acute  diverticulitis.  4. Small left inguinal hernia containing only fat.  Original Report Authenticated By: Vinnie Langton, M.D.    STS Risk Calculator   Procedure AVR +  redo CABG (does not include MVR)   Risk of Mortality 12.4%  Morbidity or Mortality 44%  Prolonged LOS 23%  Short LOS 13%  Permanent Stroke 2.6%  Prolonged Vent Support 34%  DSW Infection 1.6%  Renal Failure 19%  Reoperation 13%  Impression:  Patient has severe symptomatic aortic stenosis associated with progressive symptoms of congestive heart failure, now consistent with [place] Heart Association functional class III. He has severe three-vessel coronary artery disease with vein  graft disease status post coronary artery bypass grafting in the distant past. He does not have suitable target vessels for repeat surgical revascularization. He also has at least moderate mitral stenosis and severe pulmonary hypertension with PA pressures approaching systemic pressure. Other comorbidities include stage III chronic kidney disease and obesity. Risks associated with conventional surgical aortic valve replacement and mitral valve replacement with or without redo coronary artery bypass grafting would be extremely high. I am also concerned about  the presence of extremely severe calcification in the mitral valve and intervalvular fibrosa making double valve replacement very high risk. Although the STS risk calculator predicts a risk of mortality for this patient of 12.4% it does not include MVR and I think the predicted risk of mortality with conventional surgery would clearly be greater than 15%, probably somewhere closer to 50%.  Last year the patient experienced dramatic improvement in symptoms of congestive heart failure following balloon aortic valvuloplasty and we could reasonably expect him to have significant symptomatic improvement after TAVR. Transcatheter aortic valve replacement is a reasonable alternative for treating his recurrent severe aortic stenosis. However, the patient does have significant asymmetric calcification in the aortic annulus and aortic root which extends down into the mitral valve, and this might substantially increased the risk of paravalvular leak. I reviewed the echo and CT scan with the patient and his wife and they have been advised of a variety of complications that might develop including but not limited to risks of death, stroke, paravalvular leak, aortic dissection or other major vascular complications, aortic annulus rupture, device embolization, cardiac rupture or perforation, mitral regurgitation, acute myocardial infarction, arrhythmia, heart block or  bradycardia requiring permanent pacemaker placement, congestive heart failure, respiratory failure, renal failure, pneumonia, infection, other late complications related to structural valve deterioration or migration, or other complications that might ultimately cause a temporary or permanent loss of functional independence or other long term morbidity. The patient provides full informed consent for the procedure as described and all questions were answered.     Plan:  We will plan to proceed with TAVR in 2 weeks. In the meantime I advised the patient against exerting himself to the point of symptoms and discussed the importance of avoiding excess fluid and avoidance of sodium.    Gaye Pollack, MD

## 2013-06-05 ENCOUNTER — Encounter (HOSPITAL_COMMUNITY): Payer: Self-pay | Admitting: Pharmacy Technician

## 2013-06-08 ENCOUNTER — Other Ambulatory Visit: Payer: Self-pay | Admitting: Family Medicine

## 2013-06-08 ENCOUNTER — Telehealth: Payer: Self-pay | Admitting: Family Medicine

## 2013-06-08 NOTE — Telephone Encounter (Signed)
Sammye Borum dropped off Handicapped parking placard form for completion.  Best number to call Sammye when form complete is [phone] / lt

## 2013-06-08 NOTE — Telephone Encounter (Signed)
I'll work on the hard copy.

## 2013-06-08 NOTE — Telephone Encounter (Signed)
Form in your In Box.

## 2013-06-08 NOTE — Telephone Encounter (Signed)
Electronic refill request, Dr. Tower out of the office this week, please advise 

## 2013-06-11 NOTE — Pre-Procedure Instructions (Signed)
Harrel A Dare Sanger.  06/11/2013   Your procedure is scheduled on:  Tuesday, APRIL 14.  Report to [facility], Main Entrance/ Entrance "A"at 5:30 AM.  Call this number if you have problems the morning of surgery: [phone]   Remember:   Do not eat food or drink liquids after midnight Monday.   Take these medicines the morning of surgery with A SIP OF WATER: metoprolol tartrate (LOPRESSOR), pantoprazole (PROTONIX), ranitidine (ZANTAC), sertraline (ZOLOFT).                Take if needed: nitroGLYCERIN (NITROSTAT).                Stop taking Herbal Medications.   Do not wear jewelry, make-up or nail polish.  Do not wear lotions, powders, or perfumes.              Men may shave face and neck.  Do not bring valuables to the hospital.  [facility] is not responsible  for any belongings or valuables.               Contacts, dentures or bridgework may not be worn into surgery.  Leave suitcase in the car. After surgery it may be brought to your room.  For patients admitted to the hospital, discharge time is determined by your treatment team.                Special Instructions: Review  [place] - Preparing For Surgery.   Please read over the following fact sheets that you were given: Pain Booklet and Surgical Site Infection Prevention

## 2013-06-12 ENCOUNTER — Encounter (HOSPITAL_COMMUNITY)
Admission: RE | Admit: 2013-06-12 | Discharge: 2013-06-12 | Disposition: A | Discharge status: Home / Self Care | Payer: Medicare Other | Source: Ambulatory Visit | Attending: Cardiovascular Disease | Admitting: Cardiovascular Disease

## 2013-06-12 ENCOUNTER — Encounter (HOSPITAL_COMMUNITY): Payer: Self-pay

## 2013-06-12 ENCOUNTER — Ambulatory Visit (HOSPITAL_COMMUNITY)
Admission: RE | Admit: 2013-06-12 | Discharge: 2013-06-12 | Disposition: A | Discharge status: Home / Self Care | Payer: Medicare Other | Source: Ambulatory Visit | Attending: Cardiovascular Disease | Admitting: Cardiovascular Disease

## 2013-06-12 ENCOUNTER — Encounter: Payer: Self-pay | Admitting: Cardiovascular Disease

## 2013-06-12 ENCOUNTER — Ambulatory Visit (INDEPENDENT_AMBULATORY_CARE_PROVIDER_SITE_OTHER): Payer: Medicare Other | Admitting: Cardiovascular Disease

## 2013-06-12 VITALS — BP 138/70 | HR 61 | Ht 70.0 in | Wt 236.0 lb

## 2013-06-12 VITALS — BP 110/57 | HR 57 | Temp 98.2°F | Resp 20 | Ht 70.0 in | Wt 235.4 lb

## 2013-06-12 DIAGNOSIS — F419 Anxiety disorder, unspecified: Secondary | ICD-10-CM

## 2013-06-12 DIAGNOSIS — Z01810 Encounter for preprocedural cardiovascular examination: Secondary | ICD-10-CM | POA: Insufficient documentation

## 2013-06-12 DIAGNOSIS — F411 Generalized anxiety disorder: Secondary | ICD-10-CM

## 2013-06-12 DIAGNOSIS — Z01818 Encounter for other preprocedural examination: Secondary | ICD-10-CM | POA: Insufficient documentation

## 2013-06-12 DIAGNOSIS — I350 Nonrheumatic aortic (valve) stenosis: Secondary | ICD-10-CM

## 2013-06-12 DIAGNOSIS — I359 Nonrheumatic aortic valve disorder, unspecified: Secondary | ICD-10-CM

## 2013-06-12 DIAGNOSIS — I25810 Atherosclerosis of coronary artery bypass graft(s) without angina pectoris: Secondary | ICD-10-CM

## 2013-06-12 HISTORY — DX: Unspecified osteoarthritis, unspecified site: M19.90

## 2013-06-12 HISTORY — DX: Anxiety disorder, unspecified: F41.9

## 2013-06-12 LAB — BLOOD GAS, ARTERIAL
Acid-base deficit: 2.7 mmol/L — ABNORMAL HIGH (ref 0.0–2.0)
Bicarbonate: 20.6 mEq/L (ref 20.0–24.0)
DRAWN BY: 181601
FIO2: 0.21 %
O2 Saturation: 98.4 %
PATIENT TEMPERATURE: 98.6
PH ART: 7.449 (ref 7.350–7.450)
TCO2: 21.6 mmol/L (ref 0–100)
pCO2 arterial: 30.3 mmHg — ABNORMAL LOW (ref 35.0–45.0)
pO2, Arterial: 83.1 mmHg (ref 80.0–100.0)

## 2013-06-12 LAB — CBC
HCT: 35.8 % — ABNORMAL LOW (ref 39.0–52.0)
Hemoglobin: 11.5 g/dL — ABNORMAL LOW (ref 13.0–17.0)
MCH: 27 pg (ref 26.0–34.0)
MCHC: 32.1 g/dL (ref 30.0–36.0)
MCV: 84 fL (ref 78.0–100.0)
PLATELETS: 263 10*3/uL (ref 150–400)
RBC: 4.26 MIL/uL (ref 4.22–5.81)
RDW: 18.1 % — AB (ref 11.5–15.5)
WBC: 7.2 10*3/uL (ref 4.0–10.5)

## 2013-06-12 LAB — COMPREHENSIVE METABOLIC PANEL
ALBUMIN: 3.8 g/dL (ref 3.5–5.2)
ALK PHOS: 34 U/L — AB (ref 39–117)
ALT: 11 U/L (ref 0–53)
AST: 19 U/L (ref 0–37)
BUN: 42 mg/dL — ABNORMAL HIGH (ref 6–23)
CHLORIDE: 98 meq/L (ref 96–112)
CO2: 18 mEq/L — ABNORMAL LOW (ref 19–32)
Calcium: 9.4 mg/dL (ref 8.4–10.5)
Creatinine, Ser: 1.97 mg/dL — ABNORMAL HIGH (ref 0.50–1.35)
GFR calc Af Amer: 38 mL/min — ABNORMAL LOW (ref >90)
GFR calc non Af Amer: 33 mL/min — ABNORMAL LOW (ref >90)
Glucose, Bld: 133 mg/dL — ABNORMAL HIGH (ref 70–99)
POTASSIUM: 4.2 meq/L (ref 3.7–5.3)
Sodium: 138 mEq/L (ref 137–147)
Total Bilirubin: 0.4 mg/dL (ref 0.3–1.2)
Total Protein: 7.8 g/dL (ref 6.0–8.3)

## 2013-06-12 LAB — HEMOGLOBIN A1C
HEMOGLOBIN A1C: 7.3 % — AB (ref <5.7)
MEAN PLASMA GLUCOSE: 163 mg/dL — AB (ref <117)

## 2013-06-12 LAB — PROTIME-INR
INR: 1.29 (ref 0.00–1.49)
Prothrombin Time: 15.8 seconds — ABNORMAL HIGH (ref 11.6–15.2)

## 2013-06-12 LAB — URINALYSIS, ROUTINE W REFLEX MICROSCOPIC
Bilirubin Urine: NEGATIVE
Glucose, UA: NEGATIVE mg/dL
Hgb urine dipstick: NEGATIVE
KETONES UR: NEGATIVE mg/dL
Leukocytes, UA: NEGATIVE
NITRITE: NEGATIVE
PH: 6 (ref 5.0–8.0)
PROTEIN: NEGATIVE mg/dL
Specific Gravity, Urine: 1.012 (ref 1.005–1.030)
Urobilinogen, UA: 1 mg/dL (ref 0.0–1.0)

## 2013-06-12 LAB — SURGICAL PCR SCREEN
MRSA, PCR: NEGATIVE
STAPHYLOCOCCUS AUREUS: POSITIVE — AB

## 2013-06-12 LAB — TYPE AND SCREEN
ABO/RH(D): A POS
Antibody Screen: NEGATIVE

## 2013-06-12 LAB — APTT: aPTT: 34 seconds (ref 24–37)

## 2013-06-12 MED ORDER — CHLORHEXIDINE GLUCONATE 4 % EX LIQD: 30.0000 mL | CUTANEOUS | Status: DC

## 2013-06-12 MED ORDER — ALPRAZOLAM 0.25 MG PO TABS: 0.2500 mg | ORAL_TABLET | Freq: Two times a day (BID) | ORAL | Status: DC | PRN

## 2013-06-12 MED ORDER — CHLORHEXIDINE GLUCONATE CLOTH 2 % EX PADS: 6.0000 | MEDICATED_PAD | Freq: Once | CUTANEOUS | Status: DC

## 2013-06-12 NOTE — Progress Notes (Signed)
[facility]   MULTIDISCIPLINARY HEART VALVE CLINIC  HISTORY AND PHYSICAL EXAM  [name] is seen back for his preoperative visit for upcoming TAVR. He is a 73 year old gentleman with extensive cardiovascular disease which has been outlined in multiple recent notes. In brief, he has extensive CAD and underwent 6 vessel CABG in 1998. He has also undergone PCI procedures. He's developed severe distal vessel diabetic coronary disease. At the time of most recent heart catheterization, he had continued patency of stents in his RCA but diffuse branch vessel disease, total occlusion of his LAD beyond the LIMA insertion site, and patency of the sequential saphenous vein graft to OM1 and OM 2.  The patient underwent balloon aortic valvuloplasty in February 2014 with a good result. He had marked improvement in his symptoms following this. He also underwent rotational atherectomy and stenting of his right coronary artery in March 2014. However, over the past 6 months he has developed progressive and severe shortness of breath once again. His most recent echocardiogram has demonstrated recurrent severe aortic stenosis. We have had extensive discussion about history main options, which include palliative medical care versus open surgical double valve surgery, versus TAVR. Please see previous notes for details of this discussion. We have ultimately elected through our multidisciplinary clinic to proceed with TAVR.  The patient continues to have fatigue and shortness of breath with minimal activity. He has New York Heart Association class III symptoms. He began to worsen about 2 weeks ago, but feels a little better after increase of his furosemide dose. He denies chest pain, lightheadedness, or syncope.  During the course of the patient's preoperative work up they have been evaluated comprehensively by a multidisciplinary team of specialists coordinated through the [facility] in  the [facility].  The patient has been counseled extensively as to the relative risks and benefits of all options for the treatment of severe aortic stenosis including long term medical therapy, conventional surgery for aortic valve replacement, and transcatheter aortic valve replacement.  The patient has been independently evaluated by two cardiac surgeons including Dr Roxy Manns and Dr. Cyndia Bent, and they are felt to be at high risk for conventional surgical aortic valve replacement based upon a predicted risk of mortality using the Society of Thoracic Surgeons risk calculator of 12.4 % (not including mitral valve replacement), both determining the patient would be a poor candidate for conventional surgery (predicted risk of mortality >15% and/or predicted risk of permanent morbidity >50%).    Based upon review of all of the patient's preoperative diagnostic tests they are felt to be candidate for transcatheter aortic valve replacement using the femoral approach as an alternative to high risk conventional surgery.  The patient has been counseled at length regarding the indications, risks and potential benefits of surgery.  All questions have been answered, and the patient now presents for surgery having provided full informed consent for the operation as planned.   Past Medical History  Diagnosis Date  . GERD (gastroesophageal reflux disease)   . Hyperlipidemia   . Hypertension   . Diabetes mellitus   . Carotid artery disease   . Cellulitis and abscess of leg, except foot   . Leg edema   . Right knee sprain   . Cramps, muscle, general   . Low back pain   . Hiatal hernia   . Chills   . Hearing loss   . Nasal congestion   . Cough   . Wheezing   .  Generalized headaches   . CAD (coronary artery disease) 11/05/1996    a. CABG x6 by Dr Redmond Pulling - LIMA to Diag+LAD, SVG to OM1+OM2, SVG to PDA+RPL, b. VG->RCA known to be occluded;  c. 05/2012 PCI/Rota/BMS to m/dRCA w/ 3.5x15 Vision BMS  x 2 post-dil to 3.75.  Marland Kitchen Mitral stenosis     a. moderate by echo 04/2012.  Marland Kitchen Chronic diastolic congestive heart failure     a. 04/2012 Echo: EF 55-60-%  . Aortic stenosis     a. 04/2012 s/p baloon valvuloplasty w/ reduction in gradient from 47 to 27.  . Pulmonary hypertension     a. 05/2012 RH cath: PA 97/32(54)  . Venous insufficiency (chronic) (peripheral)   . Sleep apnea     no cpap  sleep study >5 yrs  . Shortness of breath   . Anxiety   . Arthritis     Past Surgical History  Procedure Laterality Date  . Ett cardiolite  12/31/2006    Mild-Mod distal inf/apical ischemia  . Open heart surgery    . Acute or chronic diastolic hf  8/3/ - 03/10/1759    RLE cellulitis - HOSP  . Doppler echocardiography  10/11/2008    Mild AS, Mild-Mod MS, Severe LVH  . Le arterial and venous US  10/11/2008    Art clear.  Venous Neg DVT  . Appendectomy  12/25/2010    Dr Redmond Pulling  . Umbilical hernia repair  12/25/2010    Dr Redmond Pulling  . Hernia repair  60/73/71    umbilical hernia repair   . Tee without cardioversion  03/20/2012    Procedure: TRANSESOPHAGEAL ECHOCARDIOGRAM (TEE);  Surgeon: [name], MD;  Location: [facility];  Service: Cardiovascular;  Laterality: N/A;  . Cardiac catheterization    . Coronary artery bypass graft      Family History  Problem Relation Age of Onset  . Diabetes Other     Social History History  Substance Use Topics  . Smoking status: Never Smoker   . Smokeless tobacco: Never Used  . Alcohol Use: No    Prior to Admission medications   Medication Sig Start Date End Date Taking? Authorizing Provider  aspirin EC 81 MG tablet Take 81 mg by mouth daily.   Yes Historical Provider, MD  Coenzyme Q10 300 MG CAPS Take 300 mg by mouth daily.    Yes Historical Provider, MD  ezetimibe (ZETIA) 10 MG tablet Take 1 tablet (10 mg total) by mouth every evening. 05/04/13  Yes [name], MD  fenofibrate micronized (ANTARA) 130 MG capsule Take 1 capsule (130 mg total) by mouth  every evening. 03/25/13  Yes [name], MD  fish oil-omega-3 fatty acids 1000 MG capsule Take 1 g by mouth 2 (two) times daily.    Yes Historical Provider, MD  furosemide (LASIX) 80 MG tablet Take 80-160 mg by mouth 2 (two) times daily. 2 tabs in AM and 1 tab in PM 04/08/13  Yes [name], MD  insulin glargine (LANTUS) 100 UNIT/ML injection Inject 70 Units into the skin 3 (three) times daily.    Yes Historical Provider, MD  JANUVIA 100 MG tablet Take 100 mg by mouth at bedtime.  04/24/12  Yes Historical Provider, MD  metFORMIN (GLUCOPHAGE) 1000 MG tablet Take 1,000 mg by mouth 2 (two) times daily with a meal. 05/09/12  Yes Rogelia Mire, NP  methocarbamol (ROBAXIN) 500 MG tablet Take 500 mg by mouth daily as needed (leg cramps).   Yes Historical Provider, MD  metoprolol tartrate (LOPRESSOR) 12.5 mg TABS tablet Take 0.5 tablets (12.5 mg total) by mouth 2 (two) times daily. 03/21/13  Yes Lendon Colonel, NP  nitroGLYCERIN (NITROSTAT) 0.4 MG SL tablet Place 1 tablet (0.4 mg total) under the tongue every 5 (five) minutes as needed for chest pain. May repeat up to 3 times as needed 04/24/13 01/23/15 Yes [name], MD  omega-3 acid ethyl esters (LOVAZA) 1 G capsule Take 1 g by mouth daily.  05/20/13  Yes Historical Provider, MD  pantoprazole (PROTONIX) 40 MG tablet Take 40 mg by mouth every morning.   Yes Historical Provider, MD  potassium chloride SA (K-DUR,KLOR-CON) 20 MEQ tablet Take 20-40 mEq by mouth 2 (two) times daily. 2 tabs in the morning and 1 tab in the evening   Yes Historical Provider, MD  Powders (ANTI MONKEY BUTT) POWD Apply 1 application topically as needed (burning itching drying up). Apply to legs and rub in   Yes Historical Provider, MD  ranitidine (ZANTAC) 150 MG tablet Take 150 mg by mouth 2 (two) times daily.  04/11/12  Yes Historical Provider, MD  repaglinide (PRANDIN) 2 MG tablet Take 2 mg by mouth 2 (two) times daily.    Yes Historical Provider, MD  rosuvastatin  (CRESTOR) 20 MG tablet Take 1 tablet (20 mg total) by mouth at bedtime. 01/24/12 07/04/13 Yes Renella Cunas, MD  sertraline (ZOLOFT) 50 MG tablet TAKE 1 TABLET BY MOUTH DAILY 06/08/13  Yes Ria Bush, MD  Vitamin D, Ergocalciferol, (DRISDOL) 50000 UNITS CAPS Take 50,000 Units by mouth every 7 (seven) days. Take every week on saturday   Yes Historical Provider, MD  ALPRAZolam (XANAX) 0.25 MG tablet Take 1 tablet (0.25 mg total) by mouth 2 (two) times daily as needed for anxiety. 06/12/13   [name], MD    Allergies  Allergen Reactions  . Adhesive [Tape] Rash    blistered  . Celecoxib Rash     Review of Systems:   Positive for bilateral leg edema, extensive stasis ulceration, shortness of breath, decreased energy, blurry vision, hearing loss, all other systems reviewed and are negative except as above.  Physical Exam: Filed Vitals:   06/12/13 1426  BP: 138/70  Pulse: 61   Pt is alert and oriented, WD, WN, in no distress.  HEENT: normal  Neck: JVP difficult to visualize but appears elevated. Carotid upstrokes normal  Lungs: equal expansion, clear bilaterally  CV: Regular rate and rhythm with grade 3/6 harsh percent a decrescendo murmur at the upper sternal border  Abd: soft, NT, +BS, no bruit, no hepatosplenomegaly  Back: no CVA tenderness  Ext: 1+ pretibial edema bilaterally, stasis ulcers noted bilaterally.  Neuro: CNII-XII intact  Strength intact = bilaterally    Diagnostic Tests:  Cardiac catheterization 03/19/2013: Procedural Findings:  Hemodynamics  RA A wave 19, V wave 17, mean 14  RV 96/18  PA 98/34 with a mean of 56  PCWP A wave 28, V wave 31, mean 27  LV 167/23  AO 118/61 with a mean of 84  Oxygen saturations:  PA 56  AO 91  Cardiac Output (Fick) 5.0 L per min  Cardiac Index (Fick) 2.2 L per minute per meter squared  Aortic valve hemodynamics: The peak gradient 49 mm mercury, mean gradient 37 mm mercury, aortic valve area 1.0 cm.  Mitral valve  hemodynamics: Mean gradient 11, mitral valve area 1.16 cm    Coronary angiography:  Coronary dominance: right  Left mainstem: The left main is severely calcified.  The vessel has 90% distal stenosis.  Left anterior descending (LAD): The LAD is occluded at the first septal perforator. The entirety of the LAD is severely calcified always to the LV apex.  Left circumflex (LCx): The left circumflex is totally occluded. There is a sequential vein graft supplying the first and second OM branches with continued patency  Right coronary artery (RCA): The RCA is severely calcified throughout its entirety. The vessel is stented with continued stent patency. There is mild in-stent restenosis in the mid and distal vessel. There is diffuse disease throughout the proximal, mid, and distal RCA, but no more than 40-50% stenosis noted. The PDA and PLA branches have no evidence of high-grade stenosis.  Saphenous vein graft sequential to OM1 and OM 2: Widely patent throughout  LIMA sequential to second diagonal and LAD: The diagonal is patent beyond the LIMA anastomosis with 70% stenosis noted as it divides into twin vessels. The diagonal is small through this region. The sequential limb to the LAD is patent. However, the LAD is totally occluded at the anastomotic site. Previously the vessel was severely diseased throughout.  Left ventriculography: Deferred because of chronic kidney disease  Final Conclusions:  1. Severe aortic stenosis  2. Moderate to severe mitral stenosis  3. Severe native three-vessel disease with total occlusion of the LAD, total occlusion left circumflex, and continued patency of the severely calcified, moderately diseased dominant RCA  4. Status post aortocoronary bypass surgery with continued patency of the saphenous vein graft sequential to OM1 and OM 2, known occlusion of the saphenous vein graft to RCA branches, and continued patency of the LIMA sequential to diagonal 2 and the mid LAD.  However, total occlusion of the mid LAD just beyond the LIMA anastomosis.  5. Severe pulmonary hypertension.  2-D echocardiogram 03/21/2013: Left ventricle: The cavity size was normal. Wall thickness was increased in a pattern of moderate LVH. Systolic function was moderately reduced. The estimated ejection fraction was in the range of 35% to 40%. Diffuse hypokinesis. Early diastolic septal annular tissue Doppler velocities Ea were abnormal.  ------------------------------------------------------------ Aortic valve: Moderately to severely calcified annulus. Severely thickened leaflets. Doppler: There was severe stenosis. Valve area: 0.97cm^2(VTI). Indexed valve area: 0.43cm^2/m^2 (VTI). Peak velocity ratio of LVOT to aortic valve: 0.32. Valve area: 1.01cm^2 (Vmax). Indexed valve area: 0.45cm^2/m^2 (Vmax). Mean gradient: 8mm Hg (S). Peak gradient: 62mm Hg (S).  ------------------------------------------------------------ Aorta: Aortic root: The aortic root was normal in size. Ascending aorta: The ascending aorta was normal in size.  ------------------------------------------------------------ Mitral valve: Moderately thickened, severely calcified leaflets . Doppler: The findings are consistent with moderate stenosis. Valve area by pressure half-time: 1.29cm^2. Indexed valve area by pressure half-time: 0.58cm^2/m^2. Valve area by continuity equation (using LVOT flow): 0.9cm^2. Indexed valve area by continuity equation (using LVOT flow): 0.4cm^2/m^2. Mean gradient: 81mm Hg (D). Peak gradient: 29mm Hg (D).  ------------------------------------------------------------ Left atrium: The atrium was moderately to severely dilated.  ------------------------------------------------------------ Right ventricle: The cavity size was mildly dilated. Systolic function was mildly reduced.  ------------------------------------------------------------ Pulmonic valve: The valve appears to be  grossly normal. Doppler: Mild regurgitation.  ------------------------------------------------------------ Tricuspid valve: Structurally normal valve. Doppler: Trivial regurgitation.  ------------------------------------------------------------ Right atrium: The atrium was moderately dilated.  ------------------------------------------------------------ Pericardium: There was no pericardial effusion.  ------------------------------------------------------------  2D measurements Normal Doppler measurements Normal Left ventricle Left ventricle LVID ED, 47.4 mm 43-52 Ea, lat 4.4 cm/s ------ chord, ann, tiss PLAX DP LVID ES, 33.5 mm 23-38 E/Ea, lat 65.2 ------ chord, ann, tiss 3 PLAX DP  FS, chord, 29 % >29 Ea, med 3.44 cm/s ------ PLAX ann, tiss LVPW, ED 16.5 mm ------ DP IVS/LVPW 1.03 <1.3 E/Ea, med 83.4 ------ ratio, ED ann, tiss 3 Ventricular septum DP IVS, ED 17 mm ------ LVOT LVOT Peak vel, 138 cm/s ------ Diam, S 20 mm ------ S Area 3.14 cm^2 ------ Peak 8 mm Hg ------ Aorta gradient, Root diam, 34 mm ------ S ED Aortic valve Left atrium Peak vel, 431 cm/s ------ AP dim 57 mm ------ S AP dim 2.56 cm/m^2 <2.2 Mean vel, 308 cm/s ------ index S Vol, S 157 ml ------ VTI, S 99.4 cm ------ Vol index, 70.4 ml/m^2 ------ Mean 43 mm Hg ------ S gradient, S Peak 74 mm Hg ------ gradient, S Area, VTI 0.97 cm^2 ------ Area index 0.43 cm^2/m ------ (VTI) ^2 Peak vel 0.32 ------ ratio, LVOT/AV Area, Vmax 1.01 cm^2 ------ Area index 0.45 cm^2/m ------ (Vmax) ^2 Mitral valve Peak E vel 287 cm/s ------ Peak A vel 232 cm/s ------ Mean vel, 196 cm/s ------ D Decelerati 673 ms 150-23 on time 0 Pressure 170 ms ------ half-time Mean 17 mm Hg ------ gradient, D Peak 34 mm Hg ------ gradient, D Peak E/A 1.2 ------ ratio Area (PHT) 1.29 cm^2 ------ Area index 0.58 cm^2/m ------ (PHT) ^2 Area 0.9 cm^2 ------ (LVOT) continuity Area index 0.4 cm^2/m ------ (LVOT  ^2 cont) Annulus 122 cm ------ VTI Systemic veins Estimated 20 mm Hg ------  Gated cardiac CTA 06/04/2012: Findings:  Aortic Valve: Tri-leaflet and heavily calcified including base of leaflets. Extensive calcification extending from the base of the left coronary cusp through the intervalvular fibrosa to the mitral annulus. The mitral annulus is severely calcified as well  Virtual Basal Annulus: Difficult to measure due to extensive calcification. 40% phase used  Max/Min Diameter: 69mm/21mm Area: 525cm2 inclusive of calcium ( Note Dr Weber Cooks measures 512cm2 and 464cm2 excluding calcium) Perimeter: 73mm  Aorta: No aneurysmal dilatation or dissection. Minor calcification on the minor surface of the arch and left subclavian take-off Normal arch vessel origins  Ascending Aorta: 3.5 cm STJ: 2.7 cm  Sinus Measurements:  NCS: 56mm RCS: 30 mm RCA height above valve plane 13 mm LCS: 33 mm LM height above valve plane 15 mm  Coronary Arteries:  LM- extremely calcified with distal 80% stenosis LAD- occluded in mid vessel with patent LIMA Circumflex- occluded proximally RCA: extremely calcified with patent stent in mid vessel  SVG RCA- occluded in mid vessel SVG IM- patent [place] LAD- patent  Impression:  1) Severely calcified trileaflet aortic valve. Unfavorable characteristics for TAVR include heavy calcification at the annulus extending from the left cusp into the intervalvular fibrosa and MAC with known mitral stenosis  2) Annular measurements noted above suitable for a 15mm Sapien Valve 3) No arch abnormalities that would affect transfemoral delivery 4) Best angiographic delivery angle LAO 24 degrees Cranial 11 degrees 5) Severe native CAD with occluded SVG to RCA and patent SVG to OM and LIMA to LAD Jenkins Rouge MD [facility]  CTA abdomen/pelvis: VASCULAR MEASUREMENTS PERTINENT TO TAVR:  AORTA:  Minimal Aortic Diameter - 15 x 14 mm  Severity of Aortic  Calcification - mild  RIGHT PELVIS:  Right Common Iliac Artery -  Minimal Diameter - 11.3 x 8.8 mm  Tortuosity - mild  Calcification - mild  Right External Iliac Artery -  Minimal Diameter - 10.3 x 10.2 mm  Tortuosity - moderate  Calcification - mild  Right Common Femoral Artery -  Minimal Diameter -  10.1 x 8.8 mm  Tortuosity - mild  Calcification - mild  LEFT PELVIS:  Left Common Iliac Artery -  Minimal Diameter - 9.2 x 10.7 mm  Tortuosity - mild  Calcification - mild  Left External Iliac Artery -  Minimal Diameter - 10.4 x 12.0 mm  Tortuosity - moderate  Calcification - none  Left Common Femoral Artery -  Minimal Diameter - 9.8 x 10.5 mm  Tortuosity - mild  Calcification - mild   STS Risk Calculator  Procedure AVR + redo CABG (does not include MVR)  Risk of Mortality 12.4%  Morbidity or Mortality 44%  Prolonged LOS 23%  Short LOS 13%  Permanent Stroke 2.6%  Prolonged Vent Support 34%  DSW Infection 1.6%  Renal Failure 19%  Reoperation 13%  Impression: 73 year old gentleman with severe symptomatic aortic stenosis, New York Heart Association class III congestive heart failure. Multiple severe comorbidities conditions include extensive coronary artery disease not amenable to revascularization, severe pulmonary hypertension, at least moderate mitral stenosis, stage III chronic kidney disease, obesity, and severe venous insufficiency with extensive stasis ulcerations.  Importantly, he had marked symptomatic improvement following balloon valvuloplasty. We are hopeful that TAVR will improve his heart failure and offer him mortality benefit as would be predicted. He is clearly failing medical therapy with progressive symptoms of heart failure as outlined above.  Plan:  We plan to proceed with transcatheter aortic valve replacement using the right femoral approach as an alternative to high risk conventional aortic valve replacement.  The patient  understands and accepts all potential associated risks of surgery including but not limited to risks of death, stroke, paravalvular leak, aortic dissection, other major vascular complications, aortic annulus rupture, device embolization, cardiac rupture or perforation, mitral regurgitation, acute myocardial infarction, arrhythmia, heart block or bradycardia requiring permanent pacemaker placement, congestive heart failure, respiratory failure, renal failure, pneumonia, infection, pleural effusion, pericardial effusion or tamponade, pulmonary embolus or other thromboembolic complications, late complications related to structural valve deterioration or migration, or other complications that might ultimately cause a temporary or permanent loss of functional independence or other long term morbidity.  Following the decision to proceed with transcatheter aortic valve replacement, a discussion has also been held regarding what types of management strategies would be attempted intraoperatively in the event of life-threatening complications, including whether or not the patient would be considered a candidate for the use of cardiopulmonary bypass and/or conversion to open sternotomy for attempted surgical intervention.  The patient provides full informed consent for the procedure as planned and all questions have been answered.   The patient expresses very clearly that if catastrophic or life-threatening complication were to occur during TAVR, he would not want to be treated with open cardiac surgery or redo sternotomy.  I have asked him to continue furosemide at his current dose until the day prior to surgery, then to hold the afternoon dose as well as the morning dose on the day of surgery. He was also instructed on Metformin. All questions were answered.  Blane Ohara, M.D.

## 2013-06-12 NOTE — Pre-Procedure Instructions (Signed)
Jurrell Royster.  06/12/2013   Your procedure is scheduled on:  06/16/13  Report to [facility]  2 * 3 at 530 AM.  Call this number if you have problems the morning of surgery: [phone]   Remember:   Do not eat food or drink liquids after midnight.   Take these medicines the morning of surgery with A SIP OF WATER: metroprolol,protonix,zoloft   Do not wear jewelry, make-up or nail polish.  Do not wear lotions, powders, or perfumes. You may wear deodorant.  Do not shave 48 hours prior to surgery. Men may shave face and neck.  Do not bring valuables to the hospital.  [facility] is not responsible                  for any belongings or valuables.               Contacts, dentures or bridgework may not be worn into surgery.  Leave suitcase in the car. After surgery it may be brought to your room.  For patients admitted to the hospital, discharge time is determined by your                treatment team.               Patients discharged the day of surgery will not be allowed to drive  home.  Name and phone number of your driver: family  Special Instructions: Shower using CHG 2 nights before surgery and the night before surgery.  If you shower the day of surgery use CHG.  Use special wash - you have one bottle of CHG for all showers.  You should use approximately 1/3 of the bottle for each shower.   Please read over the following fact sheets that you were given: Pain Booklet, Coughing and Deep Breathing, Blood Transfusion Information, MRSA Information and Surgical Site Infection Prevention

## 2013-06-12 NOTE — Pre-Procedure Instructions (Signed)
[name] [name].  06/12/2013   Your procedure is scheduled on:  Tuesday, APRIL 14.  Report to [facility], Main Entrance/ Entrance "A"at 5:30 AM.  Call this number if you have problems the morning of surgery: [phone]   Remember:   Do not eat food or drink liquids after midnight Monday.   Take these medicines the morning of surgery with A SIP OF WATER: metoprolol tartrate (LOPRESSOR), pantoprazole (PROTONIX), ranitidine (ZANTAC), sertraline (ZOLOFT).                Take if needed: nitroGLYCERIN (NITROSTAT).                Stop taking Herbal Medications.   Do not wear jewelry, make-up or nail polish.  Do not wear lotions, powders, or perfumes.              Men may shave face and neck.  Do not bring valuables to the hospital.  [facility] is not responsible  for any belongings or valuables.               Contacts, dentures or bridgework may not be worn into surgery.  Leave suitcase in the car. After surgery it may be brought to your room.  For patients admitted to the hospital, discharge time is determined by your treatment team.                Special Instructions: Review  [facility] - Preparing For Surgery.   Please read over the following fact sheets that you were given: Pain Booklet and Surgical Site Infection Prevention

## 2013-06-12 NOTE — Patient Instructions (Addendum)
Your physician has recommended you make the following change in your medication: TAVR scheduled 06/16/13--Do not take Furosemide and Metformin the night before and morning of procedure. Please take half of your dose of Lantus on Monday night.  Use Xanax as needed for anxiety.

## 2013-06-15 ENCOUNTER — Ambulatory Visit: Payer: Medicare Other | Admitting: Thoracic Surgery (Cardiothoracic Vascular Surgery)

## 2013-06-15 MED ORDER — POTASSIUM CHLORIDE 2 MEQ/ML IV SOLN
80.0000 meq | INTRAVENOUS | Status: DC
  Filled 2013-06-15: qty 40

## 2013-06-15 MED ORDER — PHENYLEPHRINE HCL 10 MG/ML IJ SOLN
30.0000 ug/min | INTRAVENOUS | Status: DC
  Filled 2013-06-15: qty 2

## 2013-06-15 MED ORDER — MAGNESIUM SULFATE 50 % IJ SOLN
40.0000 meq | INTRAMUSCULAR | Status: DC
  Filled 2013-06-15: qty 10

## 2013-06-15 MED ORDER — METOPROLOL TARTRATE 12.5 MG HALF TABLET: 12.5000 mg | ORAL_TABLET | Freq: Once | ORAL | Status: DC

## 2013-06-15 MED ORDER — NITROGLYCERIN IN D5W 200-5 MCG/ML-% IV SOLN
2.0000 ug/min | INTRAVENOUS | Status: DC
  Filled 2013-06-15: qty 250

## 2013-06-15 MED ORDER — SODIUM CHLORIDE 0.9 % IV SOLN: INTRAVENOUS | Status: DC

## 2013-06-15 MED ORDER — SODIUM CHLORIDE 0.9 % IV SOLN
INTRAVENOUS | Status: DC
  Filled 2013-06-15: qty 30

## 2013-06-15 MED ORDER — DOPAMINE-DEXTROSE 3.2-5 MG/ML-% IV SOLN
2.0000 ug/kg/min | INTRAVENOUS | Status: DC
  Filled 2013-06-15: qty 250

## 2013-06-15 MED ORDER — VANCOMYCIN HCL 10 G IV SOLR
1500.0000 mg | INTRAVENOUS | Status: AC
  Administered 2013-06-16: 1500 mg via INTRAVENOUS
  Filled 2013-06-15: qty 1500

## 2013-06-15 MED ORDER — DEXMEDETOMIDINE HCL IN NACL 400 MCG/100ML IV SOLN
0.1000 ug/kg/h | INTRAVENOUS | Status: AC
  Administered 2013-06-16: 0.3 ug/kg/h via INTRAVENOUS
  Filled 2013-06-15: qty 100

## 2013-06-15 MED ORDER — SODIUM CHLORIDE 0.9 % IV SOLN
INTRAVENOUS | Status: AC
  Administered 2013-06-16: 3.6 [IU]/h via INTRAVENOUS
  Filled 2013-06-15: qty 1

## 2013-06-15 MED ORDER — DEXTROSE 5 % IV SOLN
1.5000 g | INTRAVENOUS | Status: AC
  Administered 2013-06-16: 1.5 g via INTRAVENOUS
  Filled 2013-06-15: qty 1.5

## 2013-06-15 MED ORDER — EPINEPHRINE HCL 1 MG/ML IJ SOLN
0.5000 ug/min | INTRAVENOUS | Status: AC
  Administered 2013-06-16: 3 ug/min via INTRAVENOUS
  Filled 2013-06-15: qty 4

## 2013-06-15 MED ORDER — DEXTROSE 5 % IV SOLN
0.0000 ug/min | INTRAVENOUS | Status: AC
  Administered 2013-06-16: 2 ug/min via INTRAVENOUS
  Filled 2013-06-15: qty 8

## 2013-06-16 ENCOUNTER — Encounter (HOSPITAL_COMMUNITY): Payer: Medicare Other | Admitting: Certified Registered"

## 2013-06-16 ENCOUNTER — Inpatient Hospital Stay (HOSPITAL_COMMUNITY): Payer: Medicare Other

## 2013-06-16 ENCOUNTER — Inpatient Hospital Stay (HOSPITAL_COMMUNITY): Payer: Medicare Other | Admitting: Certified Registered"

## 2013-06-16 ENCOUNTER — Inpatient Hospital Stay (HOSPITAL_COMMUNITY)
Admission: RE | Admit: 2013-06-16 | Discharge: 2013-06-21 | DRG: 266 | Disposition: A | Discharge status: Home / Self Care | Payer: Medicare Other | Source: Ambulatory Visit | Attending: Cardiovascular Disease | Admitting: Cardiovascular Disease

## 2013-06-16 ENCOUNTER — Encounter (HOSPITAL_COMMUNITY)
Admission: RE | Disposition: A | Discharge status: Home / Self Care | Payer: Medicare Other | Source: Ambulatory Visit | Attending: Cardiovascular Disease

## 2013-06-16 ENCOUNTER — Encounter (HOSPITAL_COMMUNITY): Payer: Self-pay | Admitting: *Deleted

## 2013-06-16 DIAGNOSIS — N183 Chronic kidney disease, stage 3 unspecified: Secondary | ICD-10-CM | POA: Diagnosis present

## 2013-06-16 DIAGNOSIS — I2510 Atherosclerotic heart disease of native coronary artery without angina pectoris: Secondary | ICD-10-CM | POA: Diagnosis present

## 2013-06-16 DIAGNOSIS — I25810 Atherosclerosis of coronary artery bypass graft(s) without angina pectoris: Secondary | ICD-10-CM | POA: Diagnosis present

## 2013-06-16 DIAGNOSIS — L98499 Non-pressure chronic ulcer of skin of other sites with unspecified severity: Secondary | ICD-10-CM

## 2013-06-16 DIAGNOSIS — R0902 Hypoxemia: Secondary | ICD-10-CM | POA: Diagnosis not present

## 2013-06-16 DIAGNOSIS — Z951 Presence of aortocoronary bypass graft: Secondary | ICD-10-CM

## 2013-06-16 DIAGNOSIS — L03119 Cellulitis of unspecified part of limb: Secondary | ICD-10-CM

## 2013-06-16 DIAGNOSIS — G4730 Sleep apnea, unspecified: Secondary | ICD-10-CM | POA: Diagnosis present

## 2013-06-16 DIAGNOSIS — I050 Rheumatic mitral stenosis: Secondary | ICD-10-CM

## 2013-06-16 DIAGNOSIS — Z7982 Long term (current) use of aspirin: Secondary | ICD-10-CM

## 2013-06-16 DIAGNOSIS — I080 Rheumatic disorders of both mitral and aortic valves: Principal | ICD-10-CM | POA: Diagnosis present

## 2013-06-16 DIAGNOSIS — T82897A Other specified complication of cardiac prosthetic devices, implants and grafts, initial encounter: Secondary | ICD-10-CM | POA: Diagnosis present

## 2013-06-16 DIAGNOSIS — Z006 Encounter for examination for normal comparison and control in clinical research program: Secondary | ICD-10-CM

## 2013-06-16 DIAGNOSIS — E785 Hyperlipidemia, unspecified: Secondary | ICD-10-CM | POA: Diagnosis present

## 2013-06-16 DIAGNOSIS — I5033 Acute on chronic diastolic (congestive) heart failure: Secondary | ICD-10-CM

## 2013-06-16 DIAGNOSIS — I129 Hypertensive chronic kidney disease with stage 1 through stage 4 chronic kidney disease, or unspecified chronic kidney disease: Secondary | ICD-10-CM | POA: Diagnosis present

## 2013-06-16 DIAGNOSIS — E1149 Type 2 diabetes mellitus with other diabetic neurological complication: Secondary | ICD-10-CM

## 2013-06-16 DIAGNOSIS — Z79899 Other long term (current) drug therapy: Secondary | ICD-10-CM

## 2013-06-16 DIAGNOSIS — I099 Rheumatic heart disease, unspecified: Secondary | ICD-10-CM

## 2013-06-16 DIAGNOSIS — R0989 Other specified symptoms and signs involving the circulatory and respiratory systems: Secondary | ICD-10-CM

## 2013-06-16 DIAGNOSIS — H9190 Unspecified hearing loss, unspecified ear: Secondary | ICD-10-CM | POA: Diagnosis present

## 2013-06-16 DIAGNOSIS — I5032 Chronic diastolic (congestive) heart failure: Secondary | ICD-10-CM

## 2013-06-16 DIAGNOSIS — I2582 Chronic total occlusion of coronary artery: Secondary | ICD-10-CM | POA: Diagnosis present

## 2013-06-16 DIAGNOSIS — I359 Nonrheumatic aortic valve disorder, unspecified: Secondary | ICD-10-CM

## 2013-06-16 DIAGNOSIS — E119 Type 2 diabetes mellitus without complications: Secondary | ICD-10-CM | POA: Diagnosis present

## 2013-06-16 DIAGNOSIS — Z952 Presence of prosthetic heart valve: Secondary | ICD-10-CM

## 2013-06-16 DIAGNOSIS — I498 Other specified cardiac arrhythmias: Secondary | ICD-10-CM | POA: Diagnosis not present

## 2013-06-16 DIAGNOSIS — I2584 Coronary atherosclerosis due to calcified coronary lesion: Secondary | ICD-10-CM | POA: Diagnosis present

## 2013-06-16 DIAGNOSIS — J9819 Other pulmonary collapse: Secondary | ICD-10-CM | POA: Diagnosis not present

## 2013-06-16 DIAGNOSIS — I70209 Unspecified atherosclerosis of native arteries of extremities, unspecified extremity: Secondary | ICD-10-CM

## 2013-06-16 DIAGNOSIS — Y849 Medical procedure, unspecified as the cause of abnormal reaction of the patient, or of later complication, without mention of misadventure at the time of the procedure: Secondary | ICD-10-CM | POA: Diagnosis present

## 2013-06-16 DIAGNOSIS — E669 Obesity, unspecified: Secondary | ICD-10-CM

## 2013-06-16 DIAGNOSIS — I10 Essential (primary) hypertension: Secondary | ICD-10-CM

## 2013-06-16 DIAGNOSIS — I509 Heart failure, unspecified: Secondary | ICD-10-CM | POA: Diagnosis present

## 2013-06-16 DIAGNOSIS — I272 Pulmonary hypertension, unspecified: Secondary | ICD-10-CM

## 2013-06-16 DIAGNOSIS — K219 Gastro-esophageal reflux disease without esophagitis: Secondary | ICD-10-CM | POA: Diagnosis present

## 2013-06-16 DIAGNOSIS — I350 Nonrheumatic aortic (valve) stenosis: Secondary | ICD-10-CM | POA: Diagnosis present

## 2013-06-16 DIAGNOSIS — E875 Hyperkalemia: Secondary | ICD-10-CM | POA: Diagnosis not present

## 2013-06-16 DIAGNOSIS — I5043 Acute on chronic combined systolic (congestive) and diastolic (congestive) heart failure: Secondary | ICD-10-CM | POA: Diagnosis not present

## 2013-06-16 DIAGNOSIS — Z794 Long term (current) use of insulin: Secondary | ICD-10-CM

## 2013-06-16 DIAGNOSIS — L02419 Cutaneous abscess of limb, unspecified: Secondary | ICD-10-CM

## 2013-06-16 DIAGNOSIS — I2789 Other specified pulmonary heart diseases: Secondary | ICD-10-CM | POA: Diagnosis present

## 2013-06-16 DIAGNOSIS — I872 Venous insufficiency (chronic) (peripheral): Secondary | ICD-10-CM

## 2013-06-16 DIAGNOSIS — R609 Edema, unspecified: Secondary | ICD-10-CM

## 2013-06-16 HISTORY — DX: Presence of prosthetic heart valve: Z95.2

## 2013-06-16 HISTORY — PX: INTRAOPERATIVE TRANSESOPHAGEAL ECHOCARDIOGRAM: SHX5062

## 2013-06-16 HISTORY — PX: TRANSCATHETER AORTIC VALVE REPLACEMENT, TRANSFEMORAL: SHX6400

## 2013-06-16 LAB — POCT I-STAT 3, ART BLOOD GAS (G3+)
ACID-BASE DEFICIT: 3 mmol/L — AB (ref 0.0–2.0)
ACID-BASE DEFICIT: 3 mmol/L — AB (ref 0.0–2.0)
ACID-BASE DEFICIT: 3 mmol/L — AB (ref 0.0–2.0)
Acid-base deficit: 5 mmol/L — ABNORMAL HIGH (ref 0.0–2.0)
Acid-base deficit: 5 mmol/L — ABNORMAL HIGH (ref 0.0–2.0)
BICARBONATE: 26.2 meq/L — AB (ref 20.0–24.0)
BICARBONATE: 21.3 meq/L (ref 20.0–24.0)
BICARBONATE: 22.4 meq/L (ref 20.0–24.0)
BICARBONATE: 21.5 meq/L (ref 20.0–24.0)
BICARBONATE: 23.1 meq/L (ref 20.0–24.0)
Bicarbonate: 20.5 mEq/L (ref 20.0–24.0)
O2 SAT: 100 %
O2 SAT: 97 %
O2 Saturation: 100 %
O2 Saturation: 92 %
O2 Saturation: 97 %
O2 Saturation: 89 %
PCO2 ART: 47.9 mmHg — AB (ref 35.0–45.0)
PCO2 ART: 36.3 mmHg (ref 35.0–45.0)
PH ART: 7.322 — AB (ref 7.350–7.450)
PH ART: 7.373 (ref 7.350–7.450)
PH ART: 7.387 (ref 7.350–7.450)
PO2 ART: 439 mmHg — AB (ref 80.0–100.0)
Patient temperature: 36.3
TCO2: 28 mmol/L (ref 0–100)
TCO2: 23 mmol/L (ref 0–100)
TCO2: 24 mmol/L (ref 0–100)
TCO2: 22 mmol/L (ref 0–100)
TCO2: 23 mmol/L (ref 0–100)
TCO2: 24 mmol/L (ref 0–100)
pCO2 arterial: 38.4 mmHg (ref 35.0–45.0)
pCO2 arterial: 41.2 mmHg (ref 35.0–45.0)
pCO2 arterial: 35.6 mmHg (ref 35.0–45.0)
pCO2 arterial: 41.6 mmHg (ref 35.0–45.0)
pH, Arterial: 7.346 — ABNORMAL LOW (ref 7.350–7.450)
pH, Arterial: 7.357 (ref 7.350–7.450)
pH, Arterial: 7.35 (ref 7.350–7.450)
pO2, Arterial: 257 mmHg — ABNORMAL HIGH (ref 80.0–100.0)
pO2, Arterial: 70 mmHg — ABNORMAL LOW (ref 80.0–100.0)
pO2, Arterial: 90 mmHg (ref 80.0–100.0)
pO2, Arterial: 92 mmHg (ref 80.0–100.0)
pO2, Arterial: 58 mmHg — ABNORMAL LOW (ref 80.0–100.0)

## 2013-06-16 LAB — POCT I-STAT 4, (NA,K, GLUC, HGB,HCT)
GLUCOSE: 236 mg/dL — AB (ref 70–99)
GLUCOSE: 259 mg/dL — AB (ref 70–99)
GLUCOSE: 238 mg/dL — AB (ref 70–99)
Glucose, Bld: 180 mg/dL — ABNORMAL HIGH (ref 70–99)
HCT: 34 % — ABNORMAL LOW (ref 39.0–52.0)
HCT: 35 % — ABNORMAL LOW (ref 39.0–52.0)
HCT: 35 % — ABNORMAL LOW (ref 39.0–52.0)
HEMATOCRIT: 36 % — AB (ref 39.0–52.0)
HEMOGLOBIN: 11.6 g/dL — AB (ref 13.0–17.0)
Hemoglobin: 12.2 g/dL — ABNORMAL LOW (ref 13.0–17.0)
Hemoglobin: 11.9 g/dL — ABNORMAL LOW (ref 13.0–17.0)
Hemoglobin: 11.9 g/dL — ABNORMAL LOW (ref 13.0–17.0)
POTASSIUM: 4 meq/L (ref 3.7–5.3)
POTASSIUM: 4.5 meq/L (ref 3.7–5.3)
Potassium: 4.7 mEq/L (ref 3.7–5.3)
Potassium: 4.2 mEq/L (ref 3.7–5.3)
SODIUM: 138 meq/L (ref 137–147)
Sodium: 139 mEq/L (ref 137–147)
Sodium: 138 mEq/L (ref 137–147)
Sodium: 138 mEq/L (ref 137–147)

## 2013-06-16 LAB — CBC
HCT: 34.5 % — ABNORMAL LOW (ref 39.0–52.0)
HCT: 33.9 % — ABNORMAL LOW (ref 39.0–52.0)
HEMOGLOBIN: 10.7 g/dL — AB (ref 13.0–17.0)
Hemoglobin: 10.9 g/dL — ABNORMAL LOW (ref 13.0–17.0)
MCH: 26.7 pg (ref 26.0–34.0)
MCH: 26.6 pg (ref 26.0–34.0)
MCHC: 31.6 g/dL (ref 30.0–36.0)
MCHC: 31.6 g/dL (ref 30.0–36.0)
MCV: 84.6 fL (ref 78.0–100.0)
MCV: 84.3 fL (ref 78.0–100.0)
PLATELETS: 222 10*3/uL (ref 150–400)
Platelets: 251 10*3/uL (ref 150–400)
RBC: 4.08 MIL/uL — AB (ref 4.22–5.81)
RBC: 4.02 MIL/uL — AB (ref 4.22–5.81)
RDW: 18.1 % — ABNORMAL HIGH (ref 11.5–15.5)
RDW: 17.8 % — ABNORMAL HIGH (ref 11.5–15.5)
WBC: 12 10*3/uL — AB (ref 4.0–10.5)
WBC: 10.6 10*3/uL — ABNORMAL HIGH (ref 4.0–10.5)

## 2013-06-16 LAB — PROTIME-INR
INR: 1.39 (ref 0.00–1.49)
PROTHROMBIN TIME: 16.7 s — AB (ref 11.6–15.2)

## 2013-06-16 LAB — POCT I-STAT, CHEM 8
BUN: 31 mg/dL — AB (ref 6–23)
CREATININE: 1.9 mg/dL — AB (ref 0.50–1.35)
Calcium, Ion: 1.32 mmol/L — ABNORMAL HIGH (ref 1.13–1.30)
Chloride: 105 mEq/L (ref 96–112)
Glucose, Bld: 124 mg/dL — ABNORMAL HIGH (ref 70–99)
HCT: 33 % — ABNORMAL LOW (ref 39.0–52.0)
HEMOGLOBIN: 11.2 g/dL — AB (ref 13.0–17.0)
Potassium: 4.8 mEq/L (ref 3.7–5.3)
Sodium: 139 mEq/L (ref 137–147)
TCO2: 21 mmol/L (ref 0–100)

## 2013-06-16 LAB — GLUCOSE, CAPILLARY
GLUCOSE-CAPILLARY: 177 mg/dL — AB (ref 70–99)
GLUCOSE-CAPILLARY: 130 mg/dL — AB (ref 70–99)
Glucose-Capillary: 119 mg/dL — ABNORMAL HIGH (ref 70–99)
Glucose-Capillary: 124 mg/dL — ABNORMAL HIGH (ref 70–99)
Glucose-Capillary: 124 mg/dL — ABNORMAL HIGH (ref 70–99)
Glucose-Capillary: 130 mg/dL — ABNORMAL HIGH (ref 70–99)
Glucose-Capillary: 130 mg/dL — ABNORMAL HIGH (ref 70–99)
Glucose-Capillary: 137 mg/dL — ABNORMAL HIGH (ref 70–99)
Glucose-Capillary: 141 mg/dL — ABNORMAL HIGH (ref 70–99)
Glucose-Capillary: 169 mg/dL — ABNORMAL HIGH (ref 70–99)
Glucose-Capillary: 84 mg/dL (ref 70–99)
Glucose-Capillary: 85 mg/dL (ref 70–99)
Glucose-Capillary: 96 mg/dL (ref 70–99)

## 2013-06-16 LAB — MAGNESIUM: MAGNESIUM: 3 mg/dL — AB (ref 1.5–2.5)

## 2013-06-16 LAB — CREATININE, SERUM
CREATININE: 1.72 mg/dL — AB (ref 0.50–1.35)
GFR calc Af Amer: 45 mL/min — ABNORMAL LOW (ref >90)
GFR calc non Af Amer: 39 mL/min — ABNORMAL LOW (ref >90)

## 2013-06-16 LAB — APTT: APTT: 34 s (ref 24–37)

## 2013-06-16 SURGERY — IMPLANTATION, AORTIC VALVE, TRANSCATHETER, FEMORAL APPROACH
Anesthesia: General | Site: Chest

## 2013-06-16 MED ORDER — SODIUM CHLORIDE 0.9 % IV SOLN
INTRAVENOUS | Status: DC
  Administered 2013-06-16: 4.7 [IU]/h via INTRAVENOUS
  Filled 2013-06-16: qty 1

## 2013-06-16 MED ORDER — SUCCINYLCHOLINE CHLORIDE 20 MG/ML IJ SOLN
INTRAMUSCULAR | Status: AC
  Filled 2013-06-16: qty 1

## 2013-06-16 MED ORDER — ESMOLOL HCL 10 MG/ML IV SOLN
INTRAVENOUS | Status: AC
  Filled 2013-06-16: qty 10

## 2013-06-16 MED ORDER — FENTANYL CITRATE 0.05 MG/ML IJ SOLN
INTRAMUSCULAR | Status: AC
  Filled 2013-06-16: qty 5

## 2013-06-16 MED ORDER — PHENYLEPHRINE HCL 10 MG/ML IJ SOLN
0.0000 ug/min | INTRAMUSCULAR | Status: DC
  Filled 2013-06-16: qty 2

## 2013-06-16 MED ORDER — FENTANYL CITRATE 0.05 MG/ML IJ SOLN
INTRAMUSCULAR | Status: DC | PRN
  Administered 2013-06-16 (×2): 50 ug via INTRAVENOUS

## 2013-06-16 MED ORDER — NOREPINEPHRINE BITARTRATE 1 MG/ML IJ SOLN
0.0000 ug/min | INTRAVENOUS | Status: DC
  Filled 2013-06-16: qty 8

## 2013-06-16 MED ORDER — METOPROLOL TARTRATE 12.5 MG HALF TABLET
12.5000 mg | ORAL_TABLET | Freq: Two times a day (BID) | ORAL | Status: DC
  Filled 2013-06-16 (×3): qty 1

## 2013-06-16 MED ORDER — FUROSEMIDE 10 MG/ML IJ SOLN
40.0000 mg | Freq: Once | INTRAMUSCULAR | Status: AC
  Administered 2013-06-16: 40 mg via INTRAVENOUS

## 2013-06-16 MED ORDER — AMIODARONE HCL IN DEXTROSE 360-4.14 MG/200ML-% IV SOLN
INTRAVENOUS | Status: DC | PRN
  Administered 2013-06-16: 30 mg/h via INTRAVENOUS

## 2013-06-16 MED ORDER — NITROGLYCERIN 0.4 MG SL SUBL: 0.4000 mg | SUBLINGUAL_TABLET | SUBLINGUAL | Status: DC | PRN

## 2013-06-16 MED ORDER — ACETAMINOPHEN 160 MG/5ML PO SOLN
650.0000 mg | Freq: Once | ORAL | Status: AC
Start: 2013-06-16 — End: 2013-06-16

## 2013-06-16 MED ORDER — AMIODARONE LOAD VIA INFUSION
INTRAVENOUS | Status: DC | PRN
  Administered 2013-06-16: 150 mg via INTRAVENOUS

## 2013-06-16 MED ORDER — LIDOCAINE HCL (CARDIAC) 20 MG/ML IV SOLN
INTRAVENOUS | Status: AC
  Filled 2013-06-16: qty 5

## 2013-06-16 MED ORDER — ACETAMINOPHEN 500 MG PO TABS
1000.0000 mg | ORAL_TABLET | Freq: Four times a day (QID) | ORAL | Status: DC
  Administered 2013-06-17 – 2013-06-21 (×17): 1000 mg via ORAL
  Filled 2013-06-16 (×21): qty 2

## 2013-06-16 MED ORDER — AMIODARONE HCL IN DEXTROSE 360-4.14 MG/200ML-% IV SOLN
30.0000 mg/h | INTRAVENOUS | Status: DC
  Filled 2013-06-16: qty 200

## 2013-06-16 MED ORDER — LACTATED RINGERS IV SOLN
INTRAVENOUS | Status: DC | PRN
  Administered 2013-06-16: 07:00:00 via INTRAVENOUS

## 2013-06-16 MED ORDER — PROPOFOL 10 MG/ML IV BOLUS
INTRAVENOUS | Status: AC
  Filled 2013-06-16: qty 20

## 2013-06-16 MED ORDER — ARTIFICIAL TEARS OP OINT
TOPICAL_OINTMENT | OPHTHALMIC | Status: DC | PRN
  Administered 2013-06-16: 1 via OPHTHALMIC

## 2013-06-16 MED ORDER — SODIUM CHLORIDE 0.9 % IV SOLN: 1.0000 mL/kg/h | INTRAVENOUS | Status: AC

## 2013-06-16 MED ORDER — AMIODARONE HCL IN DEXTROSE 360-4.14 MG/200ML-% IV SOLN: 30.0000 mg/h | INTRAVENOUS | Status: DC

## 2013-06-16 MED ORDER — ALPRAZOLAM 0.25 MG PO TABS
0.2500 mg | ORAL_TABLET | Freq: Two times a day (BID) | ORAL | Status: DC | PRN
  Administered 2013-06-17: 0.25 mg via ORAL
  Filled 2013-06-16: qty 1

## 2013-06-16 MED ORDER — SODIUM CHLORIDE 0.9 % IV SOLN
INTRAVENOUS | Status: DC
  Administered 2013-06-16 – 2013-06-17 (×2): 20 mL/h via INTRAVENOUS

## 2013-06-16 MED ORDER — SUCCINYLCHOLINE CHLORIDE 20 MG/ML IJ SOLN
INTRAMUSCULAR | Status: DC | PRN
  Administered 2013-06-16: 120 mg via INTRAVENOUS

## 2013-06-16 MED ORDER — ESMOLOL HCL 10 MG/ML IV SOLN
INTRAVENOUS | Status: DC | PRN
  Administered 2013-06-16: 80 mg via INTRAVENOUS

## 2013-06-16 MED ORDER — MIDAZOLAM HCL 5 MG/5ML IJ SOLN
INTRAMUSCULAR | Status: DC | PRN
  Administered 2013-06-16: 1 mg via INTRAVENOUS

## 2013-06-16 MED ORDER — ACETAMINOPHEN 650 MG RE SUPP
650.0000 mg | Freq: Once | RECTAL | Status: AC
  Administered 2013-06-16: 650 mg via RECTAL

## 2013-06-16 MED ORDER — ROCURONIUM BROMIDE 50 MG/5ML IV SOLN
INTRAVENOUS | Status: AC
  Filled 2013-06-16: qty 1

## 2013-06-16 MED ORDER — EPINEPHRINE HCL 0.1 MG/ML IJ SOSY
PREFILLED_SYRINGE | INTRAMUSCULAR | Status: AC
  Filled 2013-06-16: qty 10

## 2013-06-16 MED ORDER — MILRINONE IN DEXTROSE 20 MG/100ML IV SOLN
0.1250 ug/kg/min | INTRAVENOUS | Status: DC
  Filled 2013-06-16: qty 100

## 2013-06-16 MED ORDER — STERILE WATER FOR INJECTION IJ SOLN
INTRAMUSCULAR | Status: AC
  Filled 2013-06-16: qty 10

## 2013-06-16 MED ORDER — FAMOTIDINE IN NACL 20-0.9 MG/50ML-% IV SOLN
20.0000 mg | Freq: Two times a day (BID) | INTRAVENOUS | Status: AC
  Administered 2013-06-16: 20 mg via INTRAVENOUS

## 2013-06-16 MED ORDER — SODIUM CHLORIDE 0.9 % IR SOLN
Status: DC | PRN
  Administered 2013-06-16: 08:00:00

## 2013-06-16 MED ORDER — ALBUMIN HUMAN 5 % IV SOLN
250.0000 mL | INTRAVENOUS | Status: AC | PRN
  Administered 2013-06-16 (×2): 250 mL via INTRAVENOUS

## 2013-06-16 MED ORDER — PROTAMINE SULFATE 10 MG/ML IV SOLN
INTRAVENOUS | Status: AC
  Filled 2013-06-16: qty 5

## 2013-06-16 MED ORDER — ARTIFICIAL TEARS OP OINT
TOPICAL_OINTMENT | OPHTHALMIC | Status: AC
  Filled 2013-06-16: qty 3.5

## 2013-06-16 MED ORDER — MAGNESIUM SULFATE 4000MG/100ML IJ SOLN
4.0000 g | Freq: Once | INTRAMUSCULAR | Status: AC
  Administered 2013-06-16: 4 g via INTRAVENOUS
  Filled 2013-06-16: qty 100

## 2013-06-16 MED ORDER — MUPIROCIN 2 % EX OINT
1.0000 "application " | TOPICAL_OINTMENT | Freq: Two times a day (BID) | CUTANEOUS | Status: AC
  Administered 2013-06-16 – 2013-06-17 (×2): 1 via NASAL
  Filled 2013-06-16: qty 22

## 2013-06-16 MED ORDER — AMIODARONE HCL IN DEXTROSE 360-4.14 MG/200ML-% IV SOLN
60.0000 mg/h | INTRAVENOUS | Status: DC
  Filled 2013-06-16: qty 200

## 2013-06-16 MED ORDER — DOPAMINE-DEXTROSE 3.2-5 MG/ML-% IV SOLN
INTRAVENOUS | Status: AC
  Administered 2013-06-16: 3 ug/kg/min via INTRAVENOUS
  Filled 2013-06-16: qty 250

## 2013-06-16 MED ORDER — LACTATED RINGERS IV SOLN: 500.0000 mL | Freq: Once | INTRAVENOUS | Status: AC | PRN

## 2013-06-16 MED ORDER — SODIUM CHLORIDE 0.9 % IJ SOLN
3.0000 mL | Freq: Two times a day (BID) | INTRAMUSCULAR | Status: DC
  Administered 2013-06-16 – 2013-06-18 (×4): 3 mL via INTRAVENOUS

## 2013-06-16 MED ORDER — AMIODARONE IV BOLUS ONLY 150 MG/100ML: 150.0000 mg | Freq: Once | INTRAVENOUS | Status: DC

## 2013-06-16 MED ORDER — PHENYLEPHRINE 40 MCG/ML (10ML) SYRINGE FOR IV PUSH (FOR BLOOD PRESSURE SUPPORT)
PREFILLED_SYRINGE | INTRAVENOUS | Status: AC
  Filled 2013-06-16: qty 10

## 2013-06-16 MED ORDER — METOPROLOL TARTRATE 25 MG/10 ML ORAL SUSPENSION
12.5000 mg | Freq: Two times a day (BID) | ORAL | Status: DC
  Filled 2013-06-16 (×3): qty 5

## 2013-06-16 MED ORDER — NITROGLYCERIN IN D5W 200-5 MCG/ML-% IV SOLN: 0.0000 ug/min | INTRAVENOUS | Status: DC

## 2013-06-16 MED ORDER — SODIUM CHLORIDE 0.9 % IJ SOLN
INTRAMUSCULAR | Status: DC | PRN
  Administered 2013-06-16: 10 mL via INTRAVENOUS

## 2013-06-16 MED ORDER — FAMOTIDINE 20 MG PO TABS
20.0000 mg | ORAL_TABLET | Freq: Every day | ORAL | Status: DC
  Administered 2013-06-17 – 2013-06-21 (×5): 20 mg via ORAL
  Filled 2013-06-16 (×6): qty 1

## 2013-06-16 MED ORDER — MIDAZOLAM HCL 2 MG/2ML IJ SOLN
INTRAMUSCULAR | Status: AC
  Filled 2013-06-16: qty 2

## 2013-06-16 MED ORDER — SODIUM CHLORIDE 0.9 % IJ SOLN: 3.0000 mL | INTRAMUSCULAR | Status: DC | PRN

## 2013-06-16 MED ORDER — SODIUM CHLORIDE 0.9 % IV SOLN
INTRAVENOUS | Status: DC
  Administered 2013-06-16: 20 mL/h via INTRAVENOUS

## 2013-06-16 MED ORDER — CALCIUM CHLORIDE 10 % IV SOLN
INTRAVENOUS | Status: DC | PRN
  Administered 2013-06-16 (×2): 200 mg via INTRAVENOUS
  Administered 2013-06-16: 500 mg via INTRAVENOUS
  Administered 2013-06-16: 100 mg via INTRAVENOUS

## 2013-06-16 MED ORDER — ASPIRIN EC 81 MG PO TBEC
81.0000 mg | DELAYED_RELEASE_TABLET | Freq: Every day | ORAL | Status: DC
  Administered 2013-06-17 – 2013-06-21 (×5): 81 mg via ORAL
  Filled 2013-06-16 (×6): qty 1

## 2013-06-16 MED ORDER — LACTATED RINGERS IV SOLN
INTRAVENOUS | Status: DC | PRN
  Administered 2013-06-16 (×2): via INTRAVENOUS

## 2013-06-16 MED ORDER — PANTOPRAZOLE SODIUM 40 MG PO TBEC
40.0000 mg | DELAYED_RELEASE_TABLET | Freq: Every morning | ORAL | Status: DC
  Administered 2013-06-17 – 2013-06-21 (×4): 40 mg via ORAL
  Filled 2013-06-16 (×4): qty 1

## 2013-06-16 MED ORDER — MIDAZOLAM HCL 2 MG/2ML IJ SOLN
2.0000 mg | INTRAMUSCULAR | Status: DC | PRN
  Administered 2013-06-16: 2 mg via INTRAVENOUS
  Filled 2013-06-16: qty 2

## 2013-06-16 MED ORDER — CHLORHEXIDINE GLUCONATE CLOTH 2 % EX PADS
6.0000 | MEDICATED_PAD | Freq: Every day | CUTANEOUS | Status: AC
  Administered 2013-06-16 – 2013-06-19 (×3): 6 via TOPICAL

## 2013-06-16 MED ORDER — SERTRALINE HCL 50 MG PO TABS
50.0000 mg | ORAL_TABLET | Freq: Every day | ORAL | Status: DC
  Administered 2013-06-17 – 2013-06-20 (×4): 50 mg via ORAL
  Filled 2013-06-16 (×6): qty 1

## 2013-06-16 MED ORDER — EZETIMIBE 10 MG PO TABS
10.0000 mg | ORAL_TABLET | Freq: Every evening | ORAL | Status: DC
  Administered 2013-06-17 – 2013-06-20 (×4): 10 mg via ORAL
  Filled 2013-06-16 (×6): qty 1

## 2013-06-16 MED ORDER — 0.9 % SODIUM CHLORIDE (POUR BTL) OPTIME
TOPICAL | Status: DC | PRN
  Administered 2013-06-16: 6000 mL

## 2013-06-16 MED ORDER — POTASSIUM CHLORIDE 10 MEQ/50ML IV SOLN: 10.0000 meq | INTRAVENOUS | Status: AC

## 2013-06-16 MED ORDER — ACETAMINOPHEN 160 MG/5ML PO SOLN: 1000.0000 mg | Freq: Four times a day (QID) | ORAL | Status: DC

## 2013-06-16 MED ORDER — DEXMEDETOMIDINE HCL IN NACL 200 MCG/50ML IV SOLN
0.1000 ug/kg/h | INTRAVENOUS | Status: DC
  Filled 2013-06-16 (×2): qty 50

## 2013-06-16 MED ORDER — SODIUM CHLORIDE 0.9 % IV SOLN: 250.0000 mL | INTRAVENOUS | Status: DC | PRN

## 2013-06-16 MED ORDER — PROPOFOL 10 MG/ML IV BOLUS
INTRAVENOUS | Status: DC | PRN
  Administered 2013-06-16: 20 mg via INTRAVENOUS
  Administered 2013-06-16: 40 mg via INTRAVENOUS
  Administered 2013-06-16: 30 mg via INTRAVENOUS
  Administered 2013-06-16: 40 mg via INTRAVENOUS

## 2013-06-16 MED ORDER — PROTAMINE SULFATE 10 MG/ML IV SOLN
INTRAVENOUS | Status: DC | PRN
  Administered 2013-06-16: 20 mg via INTRAVENOUS
  Administered 2013-06-16: 30 mg via INTRAVENOUS
  Administered 2013-06-16 (×2): 20 mg via INTRAVENOUS
  Administered 2013-06-16: 30 mg via INTRAVENOUS
  Administered 2013-06-16 (×2): 20 mg via INTRAVENOUS

## 2013-06-16 MED ORDER — LACTATED RINGERS IV SOLN
INTRAVENOUS | Status: DC
  Administered 2013-06-16: 15:00:00 via INTRAVENOUS

## 2013-06-16 MED ORDER — EPHEDRINE SULFATE 50 MG/ML IJ SOLN
INTRAMUSCULAR | Status: AC
  Filled 2013-06-16: qty 1

## 2013-06-16 MED ORDER — MORPHINE SULFATE 2 MG/ML IJ SOLN
1.0000 mg | INTRAMUSCULAR | Status: DC | PRN
  Administered 2013-06-16: 2 mg via INTRAVENOUS

## 2013-06-16 MED ORDER — ONDANSETRON HCL 4 MG/2ML IJ SOLN
4.0000 mg | Freq: Four times a day (QID) | INTRAMUSCULAR | Status: DC | PRN
  Administered 2013-06-16: 4 mg via INTRAVENOUS
  Filled 2013-06-16: qty 2

## 2013-06-16 MED ORDER — METOPROLOL TARTRATE 12.5 MG HALF TABLET: 12.5000 mg | ORAL_TABLET | Freq: Two times a day (BID) | ORAL | Status: DC

## 2013-06-16 MED ORDER — CLOPIDOGREL BISULFATE 75 MG PO TABS
75.0000 mg | ORAL_TABLET | Freq: Every day | ORAL | Status: DC
  Administered 2013-06-17 – 2013-06-21 (×5): 75 mg via ORAL
  Filled 2013-06-16 (×6): qty 1

## 2013-06-16 MED ORDER — ROCURONIUM BROMIDE 100 MG/10ML IV SOLN
INTRAVENOUS | Status: DC | PRN
  Administered 2013-06-16 (×2): 10 mg via INTRAVENOUS
  Administered 2013-06-16: 30 mg via INTRAVENOUS
  Administered 2013-06-16: 20 mg via INTRAVENOUS
  Administered 2013-06-16: 10 mg via INTRAVENOUS

## 2013-06-16 MED ORDER — EPHEDRINE SULFATE 50 MG/ML IJ SOLN
INTRAMUSCULAR | Status: DC | PRN
  Administered 2013-06-16: 10 mg via INTRAVENOUS

## 2013-06-16 MED ORDER — MORPHINE SULFATE 2 MG/ML IJ SOLN
INTRAMUSCULAR | Status: AC
  Filled 2013-06-16: qty 1

## 2013-06-16 MED ORDER — OXYCODONE HCL 5 MG PO TABS
5.0000 mg | ORAL_TABLET | ORAL | Status: DC | PRN
  Administered 2013-06-17: 5 mg via ORAL
  Administered 2013-06-18: 10 mg via ORAL
  Administered 2013-06-19 – 2013-06-21 (×3): 5 mg via ORAL
  Filled 2013-06-16: qty 2
  Filled 2013-06-16: qty 1
  Filled 2013-06-16: qty 2
  Filled 2013-06-16 (×3): qty 1

## 2013-06-16 MED ORDER — ATORVASTATIN CALCIUM 20 MG PO TABS
20.0000 mg | ORAL_TABLET | Freq: Every day | ORAL | Status: DC
  Administered 2013-06-17 – 2013-06-20 (×4): 20 mg via ORAL
  Filled 2013-06-16 (×6): qty 1

## 2013-06-16 MED ORDER — SODIUM CHLORIDE 0.9 % IJ SOLN
INTRAMUSCULAR | Status: AC
  Filled 2013-06-16: qty 10

## 2013-06-16 MED ORDER — VANCOMYCIN HCL IN DEXTROSE 1-5 GM/200ML-% IV SOLN
1000.0000 mg | Freq: Once | INTRAVENOUS | Status: AC
  Administered 2013-06-16: 1000 mg via INTRAVENOUS
  Filled 2013-06-16: qty 200

## 2013-06-16 MED ORDER — METHOCARBAMOL 500 MG PO TABS
500.0000 mg | ORAL_TABLET | Freq: Every day | ORAL | Status: DC | PRN
  Filled 2013-06-16: qty 1

## 2013-06-16 MED ORDER — INSULIN REGULAR BOLUS VIA INFUSION
0.0000 [IU] | Freq: Three times a day (TID) | INTRAVENOUS | Status: DC
  Filled 2013-06-16: qty 10

## 2013-06-16 MED ORDER — MORPHINE SULFATE 2 MG/ML IJ SOLN
2.0000 mg | INTRAMUSCULAR | Status: DC | PRN
  Administered 2013-06-16: 4 mg via INTRAVENOUS
  Administered 2013-06-17: 2 mg via INTRAVENOUS
  Filled 2013-06-16: qty 2
  Filled 2013-06-16 (×2): qty 1

## 2013-06-16 MED ORDER — METOPROLOL TARTRATE 1 MG/ML IV SOLN: 2.5000 mg | INTRAVENOUS | Status: DC | PRN

## 2013-06-16 MED ORDER — HEPARIN SODIUM (PORCINE) 1000 UNIT/ML IJ SOLN
INTRAMUSCULAR | Status: DC | PRN
  Administered 2013-06-16: 16000 [IU] via INTRAVENOUS

## 2013-06-16 MED ORDER — LACTATED RINGERS IV SOLN
INTRAVENOUS | Status: DC | PRN
  Administered 2013-06-16: 09:00:00 via INTRAVENOUS

## 2013-06-16 MED ORDER — IODIXANOL 320 MG/ML IV SOLN
INTRAVENOUS | Status: DC | PRN
  Administered 2013-06-16: 150 mL via INTRAVENOUS

## 2013-06-16 MED ORDER — CALCIUM CHLORIDE 10 % IV SOLN
INTRAVENOUS | Status: AC
  Filled 2013-06-16: qty 10

## 2013-06-16 MED ORDER — DEXTROSE 5 % IV SOLN
1.5000 g | Freq: Two times a day (BID) | INTRAVENOUS | Status: AC
  Administered 2013-06-16 – 2013-06-18 (×4): 1.5 g via INTRAVENOUS
  Filled 2013-06-16 (×4): qty 1.5

## 2013-06-16 MED ORDER — DOPAMINE-DEXTROSE 3.2-5 MG/ML-% IV SOLN
0.0000 ug/kg/min | INTRAVENOUS | Status: DC
  Administered 2013-06-16: 3 ug/kg/min via INTRAVENOUS

## 2013-06-16 SURGICAL SUPPLY — 122 items
ADAPTER CARDIOPLEGIA (MISCELLANEOUS) IMPLANT
ADH SKN CLS APL DERMABOND .7 (GAUZE/BANDAGES/DRESSINGS) ×2
ANTEGRADE CPLG (MISCELLANEOUS) IMPLANT
ATTRACTOMAT 16X20 MAGNETIC DRP (DRAPES) IMPLANT
BAG BANDED W/RUBBER/TAPE 36X54 (MISCELLANEOUS) ×4 IMPLANT
BAG DECANTER FOR FLEXI CONT (MISCELLANEOUS) IMPLANT
BAG EQP BAND 135X91 W/RBR TAPE (MISCELLANEOUS) ×2
BAG SNAP BAND KOVER 36X36 (MISCELLANEOUS) ×6 IMPLANT
BLADE STERNUM SYSTEM 6 (BLADE) ×4 IMPLANT
BLADE SURG ROTATE 9660 (MISCELLANEOUS) IMPLANT
CABLE PACING FASLOC BIEGE (MISCELLANEOUS) ×4 IMPLANT
CABLE PACING FASLOC BLUE (MISCELLANEOUS) ×4 IMPLANT
CANISTER SUCTION 2500CC (MISCELLANEOUS) IMPLANT
CANNULA FEM VENOUS REMOTE 22FR (CANNULA) IMPLANT
CANNULA FEMORAL ART 14 SM (MISCELLANEOUS) IMPLANT
CANNULA GUNDRY RCSP 15FR (MISCELLANEOUS) IMPLANT
CANNULA OPTISITE PERFUSION 16F (CANNULA) IMPLANT
CANNULA OPTISITE PERFUSION 18F (CANNULA) IMPLANT
CANNULA SOFTFLOW AORTIC 7M21FR (CANNULA) IMPLANT
CANNULA VENOUS LOW PROF 34X46 (CANNULA) IMPLANT
CATH DIAG EXPO 6F VENT PIG 145 (CATHETERS) ×2 IMPLANT
CATH EXPO 5FR AL1 (CATHETERS) ×2 IMPLANT
CATH HEART VENT LEFT (CATHETERS) IMPLANT
CATH S G BIP PACING (SET/KITS/TRAYS/PACK) ×6 IMPLANT
CATH SOFT-VU 4F 65 STRAIGHT (CATHETERS) IMPLANT
CATH SOFT-VU STRAIGHT 4F 65CM (CATHETERS) ×4
CLIP TI MEDIUM 24 (CLIP) ×2 IMPLANT
CLIP TI WIDE RED SMALL 24 (CLIP) ×2 IMPLANT
CONN ST 1/4X3/8  BEN (MISCELLANEOUS)
CONN ST 1/4X3/8 BEN (MISCELLANEOUS) IMPLANT
CONNECTOR 1/2X3/8X1/2 3 WAY (MISCELLANEOUS)
CONNECTOR 1/2X3/8X1/2 3WAY (MISCELLANEOUS) IMPLANT
CONT SPEC 4OZ CLIKSEAL STRL BL (MISCELLANEOUS) ×4 IMPLANT
COVER DOME SNAP 22 D (MISCELLANEOUS) ×4 IMPLANT
COVER MAYO STAND STRL (DRAPES) ×4 IMPLANT
COVER PROBE W GEL 5X96 (DRAPES) IMPLANT
COVER SURGICAL LIGHT HANDLE (MISCELLANEOUS) ×4 IMPLANT
COVER TABLE BACK 60X90 (DRAPES) ×4 IMPLANT
CRADLE DONUT ADULT HEAD (MISCELLANEOUS) ×4 IMPLANT
DERMABOND ADVANCED (GAUZE/BANDAGES/DRESSINGS) ×2
DERMABOND ADVANCED .7 DNX12 (GAUZE/BANDAGES/DRESSINGS) ×2 IMPLANT
DRAIN CHANNEL 28F RND 3/8 FF (WOUND CARE) IMPLANT
DRAIN CHANNEL 32F RND 10.7 FF (WOUND CARE) IMPLANT
DRAPE INCISE IOBAN 66X45 STRL (DRAPES) IMPLANT
DRAPE SLUSH/WARMER DISC (DRAPES) ×4 IMPLANT
DRAPE TABLE COVER HEAVY DUTY (DRAPES) ×4 IMPLANT
ELECT REM PT RETURN 9FT ADLT (ELECTROSURGICAL) ×8
ELECTRODE REM PT RTRN 9FT ADLT (ELECTROSURGICAL) ×4 IMPLANT
FELT TEFLON 6X6 (MISCELLANEOUS) ×4 IMPLANT
FEMORAL VENOUS CANN RAP (CANNULA) IMPLANT
FILTER STRAW FLUID ASPIR (MISCELLANEOUS) ×4 IMPLANT
GLOVE ECLIPSE 7.5 STRL STRAW (GLOVE) ×4 IMPLANT
GLOVE ECLIPSE 8.0 STRL XLNG CF (GLOVE) ×8 IMPLANT
GLOVE EUDERMIC 7 POWDERFREE (GLOVE) ×4 IMPLANT
GLOVE ORTHO TXT STRL SZ7.5 (GLOVE) ×4 IMPLANT
GOWN STRL REUS W/ TWL LRG LVL3 (GOWN DISPOSABLE) ×6 IMPLANT
GOWN STRL REUS W/ TWL XL LVL3 (GOWN DISPOSABLE) ×12 IMPLANT
GOWN STRL REUS W/TWL LRG LVL3 (GOWN DISPOSABLE) ×12
GOWN STRL REUS W/TWL XL LVL3 (GOWN DISPOSABLE) ×24
GUIDEWIRE SAF TJ AMPL .035X180 (WIRE) ×4 IMPLANT
GUIDEWIRE SAFE TJ AMPLATZ EXST (WIRE) ×2 IMPLANT
GUIDEWIRE STRAIGHT .035 260CM (WIRE) ×2 IMPLANT
HEMOSTAT POWDER SURGIFOAM 1G (HEMOSTASIS) IMPLANT
INSERT FOGARTY 61MM (MISCELLANEOUS) IMPLANT
INSERT FOGARTY XLG (MISCELLANEOUS) IMPLANT
KIT BASIN OR (CUSTOM PROCEDURE TRAY) ×4 IMPLANT
KIT DILATOR VASC 18G NDL (KITS) IMPLANT
KIT HEART LEFT (KITS) ×2 IMPLANT
KIT ROOM TURNOVER OR (KITS) ×4 IMPLANT
KIT SUCTION CATH 14FR (SUCTIONS) ×8 IMPLANT
LEAD PACING MYOCARDI (MISCELLANEOUS) IMPLANT
NDL 18GX1X1/2 (RX/OR ONLY) (NEEDLE) IMPLANT
NDL PERC 18GX7CM (NEEDLE) ×2 IMPLANT
NEEDLE 18GX1X1/2 (RX/OR ONLY) (NEEDLE) ×4 IMPLANT
NEEDLE PERC 18GX7CM (NEEDLE) ×4 IMPLANT
NS IRRIG 1000ML POUR BTL (IV SOLUTION) ×12 IMPLANT
PACK AORTA (CUSTOM PROCEDURE TRAY) ×4 IMPLANT
PAD ARMBOARD 7.5X6 YLW CONV (MISCELLANEOUS) ×8 IMPLANT
PAD ELECT DEFIB RADIOL ZOLL (MISCELLANEOUS) ×4 IMPLANT
PATCH TACHOSII LRG 9.5X4.8 (VASCULAR PRODUCTS) IMPLANT
SET CANNULATION TOURNIQUET (MISCELLANEOUS) IMPLANT
SHEATH PINNACLE 6F 10CM (SHEATH) ×4 IMPLANT
SPONGE GAUZE 4X4 12PLY (GAUZE/BANDAGES/DRESSINGS) ×6 IMPLANT
SPONGE LAP 4X18 X RAY DECT (DISPOSABLE) ×4 IMPLANT
STOPCOCK MORSE 400PSI 3WAY (MISCELLANEOUS) ×6 IMPLANT
SUT ETHIBOND 2 0 SH (SUTURE) ×4
SUT ETHIBOND 2 0 SH 36X2 (SUTURE) ×2 IMPLANT
SUT ETHIBOND X763 2 0 SH 1 (SUTURE) ×8 IMPLANT
SUT GORETEX CV 4 TH 22 36 (SUTURE) ×2 IMPLANT
SUT MNCRL AB 3-0 PS2 18 (SUTURE) ×4 IMPLANT
SUT PDS AB 1 CTX 36 (SUTURE) IMPLANT
SUT PROLENE 2 0 MH 48 (SUTURE) IMPLANT
SUT PROLENE 3 0 SH1 36 (SUTURE) IMPLANT
SUT PROLENE 4 0 RB 1 (SUTURE)
SUT PROLENE 4-0 RB1 .5 CRCL 36 (SUTURE) IMPLANT
SUT PROLENE 5 0 C 1 36 (SUTURE) ×14 IMPLANT
SUT PROLENE 6 0 C 1 30 (SUTURE) ×12 IMPLANT
SUT SILK  1 MH (SUTURE) ×2
SUT SILK 1 MH (SUTURE) ×2 IMPLANT
SUT SILK 2 0 SH CR/8 (SUTURE) ×4 IMPLANT
SUT TEM PAC WIRE 2 0 SH (SUTURE) IMPLANT
SUT VIC AB 2-0 CTX 36 (SUTURE) IMPLANT
SUT VIC AB 3-0 SH 8-18 (SUTURE) ×10 IMPLANT
SYR 30ML LL (SYRINGE) ×8 IMPLANT
SYR 3ML LL SCALE MARK (SYRINGE) ×2 IMPLANT
SYR 50ML LL SCALE MARK (SYRINGE) ×4 IMPLANT
SYSTEM SAHARA CHEST DRAIN ATS (WOUND CARE) ×4 IMPLANT
TAPE CLOTH SURG 4X10 WHT LF (GAUZE/BANDAGES/DRESSINGS) ×4 IMPLANT
TOWEL OR 17X24 6PK STRL BLUE (TOWEL DISPOSABLE) ×12 IMPLANT
TOWEL OR 17X26 10 PK STRL BLUE (TOWEL DISPOSABLE) ×8 IMPLANT
TRANSDUCER W/STOPCOCK (MISCELLANEOUS) ×4 IMPLANT
TRAY FOLEY IC TEMP SENS 14FR (CATHETERS) ×4 IMPLANT
TUBE SUCT INTRACARD DLP 20F (MISCELLANEOUS) IMPLANT
TUBING ART PRESS 72  MALE/FEM (TUBING) ×2
TUBING ART PRESS 72 MALE/FEM (TUBING) IMPLANT
TUBING HIGH PRESSURE 120CM (CONNECTOR) ×4 IMPLANT
UNDERPAD 30X30 INCONTINENT (UNDERPADS AND DIAPERS) ×4 IMPLANT
VALVE TRANSFEMORAL HRT 26MM KT (Valve) ×2 IMPLANT
VENT LEFT HEART 12002 (CATHETERS)
WATER STERILE IRR 1000ML POUR (IV SOLUTION) ×8 IMPLANT
WIRE .035 3MM-J 145CM (WIRE) ×2 IMPLANT
WIRE AMPLATZ SS-J .035X180CM (WIRE) ×2 IMPLANT

## 2013-06-16 NOTE — Transfer of Care (Signed)
Immediate Anesthesia Transfer of Care Note  Patient: [name] [name].  Procedure(s) Performed: Procedure(s): TRANSCATHETER AORTIC VALVE REPLACEMENT, TRANSFEMORAL (N/A) INTRAOPERATIVE TRANSESOPHAGEAL ECHOCARDIOGRAM (N/A)  Patient Location: SICU  Anesthesia Type:General  Level of Consciousness: sedated and Patient remains intubated per anesthesia plan  Airway & Oxygen Therapy: Patient remains intubated per anesthesia plan and Patient placed on Ventilator (see vital sign flow sheet for setting)  Post-op Assessment: Report given to PACU RN  Post vital signs: Reviewed  Complications: No apparent anesthesia complications

## 2013-06-16 NOTE — OR Nursing (Signed)
1st call SICU

## 2013-06-16 NOTE — Progress Notes (Signed)
Patient ID: [name] [name]., male   DOB: 12-27-1940, 73 y.o.   MRN: [id number]  SICU Evening Rounds  Hemodynamically stable  Rhythm is sinus 43 off amio now.  CI 1.3 but overall looks stable. This is probably related to bradycardia.  He was extubated this afternoon. He is still sleepy but appropriate.

## 2013-06-16 NOTE — Op Note (Signed)
CARDIOTHORACIC SURGERY OPERATIVE NOTE  Date of Procedure:  06/16/2013  Preoperative Diagnosis: Severe Aortic Stenosis   Postoperative Diagnosis: Same   Procedure:    Transcatheter Aortic Valve Replacement - Open Right Transfemoral Approach  Edwards Sapien XT (size 26 mm, model # 9300TFX, serial # [id number])   Co-Surgeons:  Valentina Gu. Roxy Manns, MD and [name], MD  Assistants:   Gaye Pollack, MD  Anesthesiologist:  Laurie Panda, MD  Echocardiographer:  Ena Dawley, MD  Pre-operative Echo Findings:  Severe aortic stenosis  Moderate-severe mitral stenosis  Mild global LV dysfunction  Post-operative Echo Findings:  Mild paravalvular leak  Unchanged LV function     DETAILS OF THE OPERATIVE PROCEDURE  The majority of the procedure is documented separately in a procedure note by Dr. [name].   TRANSFEMORAL ACCESS:   A small vertical incision is made in the right groin immediately over the common femoral artery. The subcutaneous tissues are divided with electrocautery and the anterior surface of the common femoral artery is identified. Sharp dissection is utilized to free up the artery proximally and distally and the vessel is encircled with a vessel loop.  A pair of CV-4 Gore-tex sutures are place as diamond-shaped purse-strings on the anterior surface of the femoral artery.  The patient is heparinized systemically and ACT verified > 250 seconds.  The common femoral artery is punctured using an 18 gauge needle and a soft J-tipped guidewire is passed into the common iliac artery under fluoroscopic guidance.  A 6 Fr straight diagnostic catheter is placed over the guidewire and the guidewire is removed.  An Amplatz super stiff guidewire is passed through the sheath into the descending thoracic aorta and the introducing diagnostic catheter is removed.  Serial dilators are passed over the guidewire under continuous fluoroscopic guidance, making certain that each dilator passes  easily all of the way into the distal abdominal aorta.  An 18 Fr Edwards Novoflex Plus introducer sheath is passed over the guidewire into the abdominal aorta.  The introducing dilator is removed, the sheath is flushed with heparinized saline, and the sheath is secured to the skin.    FEMORAL SHEATH REMOVAL AND ARTERIAL CLOSURE:  After the completion of successful valve deployment as documented separately by Dr. [name], the femoral artery sheath is removed and the arteriotomy is closed using the previously placed Gore-tex purse-string sutures. Once the repair has been completed protamine was administered to reverse the anticoagulation. There was a strong palpable pulse in the distal right femoral artery.  An arteriogram was not performed in an effort to avoid additional IV contrast administration.  The incision is irrigated with saline solution and subsequently closed in multiple layers using absorbable suture.  The skin incision is closed using a subcuticular skin closure.     Valentina Gu. Roxy Manns MD 06/16/2013 10:43 AM

## 2013-06-16 NOTE — H&P (View-Only) (Signed)
[facility]   MULTIDISCIPLINARY HEART VALVE CLINIC  HISTORY AND PHYSICAL EXAM  [name] is seen back for his preoperative visit for upcoming TAVR. He is a 73 year old gentleman with extensive cardiovascular disease which has been outlined in multiple recent notes. In brief, he has extensive CAD and underwent 6 vessel CABG in 1998. He has also undergone PCI procedures. He's developed severe distal vessel diabetic coronary disease. At the time of most recent heart catheterization, he had continued patency of stents in his RCA but diffuse branch vessel disease, total occlusion of his LAD beyond the LIMA insertion site, and patency of the sequential saphenous vein graft to OM1 and OM 2.  The patient underwent balloon aortic valvuloplasty in February 2014 with a good result. He had marked improvement in his symptoms following this. He also underwent rotational atherectomy and stenting of his right coronary artery in March 2014. However, over the past 6 months he has developed progressive and severe shortness of breath once again. His most recent echocardiogram has demonstrated recurrent severe aortic stenosis. We have had extensive discussion about history main options, which include palliative medical care versus open surgical double valve surgery, versus TAVR. Please see previous notes for details of this discussion. We have ultimately elected through our multidisciplinary clinic to proceed with TAVR.  The patient continues to have fatigue and shortness of breath with minimal activity. He has New York Heart Association class III symptoms. He began to worsen about 2 weeks ago, but feels a little better after increase of his furosemide dose. He denies chest pain, lightheadedness, or syncope.  During the course of the patient's preoperative work up they have been evaluated comprehensively by a multidisciplinary team of specialists coordinated through the [facility] in  the [facility].  The patient has been counseled extensively as to the relative risks and benefits of all options for the treatment of severe aortic stenosis including long term medical therapy, conventional surgery for aortic valve replacement, and transcatheter aortic valve replacement.  The patient has been independently evaluated by two cardiac surgeons including Dr Roxy Manns and Dr. Cyndia Bent, and they are felt to be at high risk for conventional surgical aortic valve replacement based upon a predicted risk of mortality using the Society of Thoracic Surgeons risk calculator of 12.4 % (not including mitral valve replacement), both determining the patient would be a poor candidate for conventional surgery (predicted risk of mortality >15% and/or predicted risk of permanent morbidity >50%).    Based upon review of all of the patient's preoperative diagnostic tests they are felt to be candidate for transcatheter aortic valve replacement using the femoral approach as an alternative to high risk conventional surgery.  The patient has been counseled at length regarding the indications, risks and potential benefits of surgery.  All questions have been answered, and the patient now presents for surgery having provided full informed consent for the operation as planned.   Past Medical History  Diagnosis Date  . GERD (gastroesophageal reflux disease)   . Hyperlipidemia   . Hypertension   . Diabetes mellitus   . Carotid artery disease   . Cellulitis and abscess of leg, except foot   . Leg edema   . Right knee sprain   . Cramps, muscle, general   . Low back pain   . Hiatal hernia   . Chills   . Hearing loss   . Nasal congestion   . Cough   . Wheezing   .  Generalized headaches   . CAD (coronary artery disease) 11/05/1996    a. CABG x6 by Dr Redmond Pulling - LIMA to Diag+LAD, SVG to OM1+OM2, SVG to PDA+RPL, b. VG->RCA known to be occluded;  c. 05/2012 PCI/Rota/BMS to m/dRCA w/ 3.5x15 Vision BMS  x 2 post-dil to 3.75.  Marland Kitchen Mitral stenosis     a. moderate by echo 04/2012.  Marland Kitchen Chronic diastolic congestive heart failure     a. 04/2012 Echo: EF 55-60-%  . Aortic stenosis     a. 04/2012 s/p baloon valvuloplasty w/ reduction in gradient from 47 to 27.  . Pulmonary hypertension     a. 05/2012 RH cath: PA 97/32(54)  . Venous insufficiency (chronic) (peripheral)   . Sleep apnea     no cpap  sleep study >5 yrs  . Shortness of breath   . Anxiety   . Arthritis     Past Surgical History  Procedure Laterality Date  . Ett cardiolite  12/31/2006    Mild-Mod distal inf/apical ischemia  . Open heart surgery    . Acute or chronic diastolic hf  8/3/ - 03/10/1759    RLE cellulitis - HOSP  . Doppler echocardiography  10/11/2008    Mild AS, Mild-Mod MS, Severe LVH  . Le arterial and venous US  10/11/2008    Art clear.  Venous Neg DVT  . Appendectomy  12/25/2010    Dr Redmond Pulling  . Umbilical hernia repair  12/25/2010    Dr Redmond Pulling  . Hernia repair  60/73/71    umbilical hernia repair   . Tee without cardioversion  03/20/2012    Procedure: TRANSESOPHAGEAL ECHOCARDIOGRAM (TEE);  Surgeon: [name], MD;  Location: [facility];  Service: Cardiovascular;  Laterality: N/A;  . Cardiac catheterization    . Coronary artery bypass graft      Family History  Problem Relation Age of Onset  . Diabetes Other     Social History History  Substance Use Topics  . Smoking status: Never Smoker   . Smokeless tobacco: Never Used  . Alcohol Use: No    Prior to Admission medications   Medication Sig Start Date End Date Taking? Authorizing Provider  aspirin EC 81 MG tablet Take 81 mg by mouth daily.   Yes Historical Provider, MD  Coenzyme Q10 300 MG CAPS Take 300 mg by mouth daily.    Yes Historical Provider, MD  ezetimibe (ZETIA) 10 MG tablet Take 1 tablet (10 mg total) by mouth every evening. 05/04/13  Yes [name], MD  fenofibrate micronized (ANTARA) 130 MG capsule Take 1 capsule (130 mg total) by mouth  every evening. 03/25/13  Yes [name], MD  fish oil-omega-3 fatty acids 1000 MG capsule Take 1 g by mouth 2 (two) times daily.    Yes Historical Provider, MD  furosemide (LASIX) 80 MG tablet Take 80-160 mg by mouth 2 (two) times daily. 2 tabs in AM and 1 tab in PM 04/08/13  Yes [name], MD  insulin glargine (LANTUS) 100 UNIT/ML injection Inject 70 Units into the skin 3 (three) times daily.    Yes Historical Provider, MD  JANUVIA 100 MG tablet Take 100 mg by mouth at bedtime.  04/24/12  Yes Historical Provider, MD  metFORMIN (GLUCOPHAGE) 1000 MG tablet Take 1,000 mg by mouth 2 (two) times daily with a meal. 05/09/12  Yes Rogelia Mire, NP  methocarbamol (ROBAXIN) 500 MG tablet Take 500 mg by mouth daily as needed (leg cramps).   Yes Historical Provider, MD  metoprolol tartrate (LOPRESSOR) 12.5 mg TABS tablet Take 0.5 tablets (12.5 mg total) by mouth 2 (two) times daily. 03/21/13  Yes Lendon Colonel, NP  nitroGLYCERIN (NITROSTAT) 0.4 MG SL tablet Place 1 tablet (0.4 mg total) under the tongue every 5 (five) minutes as needed for chest pain. May repeat up to 3 times as needed 04/24/13 01/23/15 Yes [name], MD  omega-3 acid ethyl esters (LOVAZA) 1 G capsule Take 1 g by mouth daily.  05/20/13  Yes Historical Provider, MD  pantoprazole (PROTONIX) 40 MG tablet Take 40 mg by mouth every morning.   Yes Historical Provider, MD  potassium chloride SA (K-DUR,KLOR-CON) 20 MEQ tablet Take 20-40 mEq by mouth 2 (two) times daily. 2 tabs in the morning and 1 tab in the evening   Yes Historical Provider, MD  Powders (ANTI MONKEY BUTT) POWD Apply 1 application topically as needed (burning itching drying up). Apply to legs and rub in   Yes Historical Provider, MD  ranitidine (ZANTAC) 150 MG tablet Take 150 mg by mouth 2 (two) times daily.  04/11/12  Yes Historical Provider, MD  repaglinide (PRANDIN) 2 MG tablet Take 2 mg by mouth 2 (two) times daily.    Yes Historical Provider, MD  rosuvastatin  (CRESTOR) 20 MG tablet Take 1 tablet (20 mg total) by mouth at bedtime. 01/24/12 07/04/13 Yes Renella Cunas, MD  sertraline (ZOLOFT) 50 MG tablet TAKE 1 TABLET BY MOUTH DAILY 06/08/13  Yes Ria Bush, MD  Vitamin D, Ergocalciferol, (DRISDOL) 50000 UNITS CAPS Take 50,000 Units by mouth every 7 (seven) days. Take every week on saturday   Yes Historical Provider, MD  ALPRAZolam (XANAX) 0.25 MG tablet Take 1 tablet (0.25 mg total) by mouth 2 (two) times daily as needed for anxiety. 06/12/13   [name], MD    Allergies  Allergen Reactions  . Adhesive [Tape] Rash    blistered  . Celecoxib Rash     Review of Systems:   Positive for bilateral leg edema, extensive stasis ulceration, shortness of breath, decreased energy, blurry vision, hearing loss, all other systems reviewed and are negative except as above.  Physical Exam: Filed Vitals:   06/12/13 1426  BP: 138/70  Pulse: 61   Pt is alert and oriented, WD, WN, in no distress.  HEENT: normal  Neck: JVP difficult to visualize but appears elevated. Carotid upstrokes normal  Lungs: equal expansion, clear bilaterally  CV: Regular rate and rhythm with grade 3/6 harsh percent a decrescendo murmur at the upper sternal border  Abd: soft, NT, +BS, no bruit, no hepatosplenomegaly  Back: no CVA tenderness  Ext: 1+ pretibial edema bilaterally, stasis ulcers noted bilaterally.  Neuro: CNII-XII intact  Strength intact = bilaterally    Diagnostic Tests:  Cardiac catheterization 03/19/2013: Procedural Findings:  Hemodynamics  RA A wave 19, V wave 17, mean 14  RV 96/18  PA 98/34 with a mean of 56  PCWP A wave 28, V wave 31, mean 27  LV 167/23  AO 118/61 with a mean of 84  Oxygen saturations:  PA 56  AO 91  Cardiac Output (Fick) 5.0 L per min  Cardiac Index (Fick) 2.2 L per minute per meter squared  Aortic valve hemodynamics: The peak gradient 49 mm mercury, mean gradient 37 mm mercury, aortic valve area 1.0 cm.  Mitral valve  hemodynamics: Mean gradient 11, mitral valve area 1.16 cm    Coronary angiography:  Coronary dominance: right  Left mainstem: The left main is severely calcified.  The vessel has 90% distal stenosis.  Left anterior descending (LAD): The LAD is occluded at the first septal perforator. The entirety of the LAD is severely calcified always to the LV apex.  Left circumflex (LCx): The left circumflex is totally occluded. There is a sequential vein graft supplying the first and second OM branches with continued patency  Right coronary artery (RCA): The RCA is severely calcified throughout its entirety. The vessel is stented with continued stent patency. There is mild in-stent restenosis in the mid and distal vessel. There is diffuse disease throughout the proximal, mid, and distal RCA, but no more than 40-50% stenosis noted. The PDA and PLA branches have no evidence of high-grade stenosis.  Saphenous vein graft sequential to OM1 and OM 2: Widely patent throughout  LIMA sequential to second diagonal and LAD: The diagonal is patent beyond the LIMA anastomosis with 70% stenosis noted as it divides into twin vessels. The diagonal is small through this region. The sequential limb to the LAD is patent. However, the LAD is totally occluded at the anastomotic site. Previously the vessel was severely diseased throughout.  Left ventriculography: Deferred because of chronic kidney disease  Final Conclusions:  1. Severe aortic stenosis  2. Moderate to severe mitral stenosis  3. Severe native three-vessel disease with total occlusion of the LAD, total occlusion left circumflex, and continued patency of the severely calcified, moderately diseased dominant RCA  4. Status post aortocoronary bypass surgery with continued patency of the saphenous vein graft sequential to OM1 and OM 2, known occlusion of the saphenous vein graft to RCA branches, and continued patency of the LIMA sequential to diagonal 2 and the mid LAD.  However, total occlusion of the mid LAD just beyond the LIMA anastomosis.  5. Severe pulmonary hypertension.  2-D echocardiogram 03/21/2013: Left ventricle: The cavity size was normal. Wall thickness was increased in a pattern of moderate LVH. Systolic function was moderately reduced. The estimated ejection fraction was in the range of 35% to 40%. Diffuse hypokinesis. Early diastolic septal annular tissue Doppler velocities Ea were abnormal.  ------------------------------------------------------------ Aortic valve: Moderately to severely calcified annulus. Severely thickened leaflets. Doppler: There was severe stenosis. Valve area: 0.97cm^2(VTI). Indexed valve area: 0.43cm^2/m^2 (VTI). Peak velocity ratio of LVOT to aortic valve: 0.32. Valve area: 1.01cm^2 (Vmax). Indexed valve area: 0.45cm^2/m^2 (Vmax). Mean gradient: 74mm Hg (S). Peak gradient: 34mm Hg (S).  ------------------------------------------------------------ Aorta: Aortic root: The aortic root was normal in size. Ascending aorta: The ascending aorta was normal in size.  ------------------------------------------------------------ Mitral valve: Moderately thickened, severely calcified leaflets . Doppler: The findings are consistent with moderate stenosis. Valve area by pressure half-time: 1.29cm^2. Indexed valve area by pressure half-time: 0.58cm^2/m^2. Valve area by continuity equation (using LVOT flow): 0.9cm^2. Indexed valve area by continuity equation (using LVOT flow): 0.4cm^2/m^2. Mean gradient: 71mm Hg (D). Peak gradient: 24mm Hg (D).  ------------------------------------------------------------ Left atrium: The atrium was moderately to severely dilated.  ------------------------------------------------------------ Right ventricle: The cavity size was mildly dilated. Systolic function was mildly reduced.  ------------------------------------------------------------ Pulmonic valve: The valve appears to be  grossly normal. Doppler: Mild regurgitation.  ------------------------------------------------------------ Tricuspid valve: Structurally normal valve. Doppler: Trivial regurgitation.  ------------------------------------------------------------ Right atrium: The atrium was moderately dilated.  ------------------------------------------------------------ Pericardium: There was no pericardial effusion.  ------------------------------------------------------------  2D measurements Normal Doppler measurements Normal Left ventricle Left ventricle LVID ED, 47.4 mm 43-52 Ea, lat 4.4 cm/s ------ chord, ann, tiss PLAX DP LVID ES, 33.5 mm 23-38 E/Ea, lat 65.2 ------ chord, ann, tiss 3 PLAX DP  FS, chord, 29 % >29 Ea, med 3.44 cm/s ------ PLAX ann, tiss LVPW, ED 16.5 mm ------ DP IVS/LVPW 1.03 <1.3 E/Ea, med 83.4 ------ ratio, ED ann, tiss 3 Ventricular septum DP IVS, ED 17 mm ------ LVOT LVOT Peak vel, 138 cm/s ------ Diam, S 20 mm ------ S Area 3.14 cm^2 ------ Peak 8 mm Hg ------ Aorta gradient, Root diam, 34 mm ------ S ED Aortic valve Left atrium Peak vel, 431 cm/s ------ AP dim 57 mm ------ S AP dim 2.56 cm/m^2 <2.2 Mean vel, 308 cm/s ------ index S Vol, S 157 ml ------ VTI, S 99.4 cm ------ Vol index, 70.4 ml/m^2 ------ Mean 43 mm Hg ------ S gradient, S Peak 74 mm Hg ------ gradient, S Area, VTI 0.97 cm^2 ------ Area index 0.43 cm^2/m ------ (VTI) ^2 Peak vel 0.32 ------ ratio, LVOT/AV Area, Vmax 1.01 cm^2 ------ Area index 0.45 cm^2/m ------ (Vmax) ^2 Mitral valve Peak E vel 287 cm/s ------ Peak A vel 232 cm/s ------ Mean vel, 196 cm/s ------ D Decelerati 673 ms 150-23 on time 0 Pressure 170 ms ------ half-time Mean 17 mm Hg ------ gradient, D Peak 34 mm Hg ------ gradient, D Peak E/A 1.2 ------ ratio Area (PHT) 1.29 cm^2 ------ Area index 0.58 cm^2/m ------ (PHT) ^2 Area 0.9 cm^2 ------ (LVOT) continuity Area index 0.4 cm^2/m ------ (LVOT  ^2 cont) Annulus 122 cm ------ VTI Systemic veins Estimated 20 mm Hg ------  Gated cardiac CTA 06/04/2012: Findings:  Aortic Valve: Tri-leaflet and heavily calcified including base of leaflets. Extensive calcification extending from the base of the left coronary cusp through the intervalvular fibrosa to the mitral annulus. The mitral annulus is severely calcified as well  Virtual Basal Annulus: Difficult to measure due to extensive calcification. 40% phase used  Max/Min Diameter: 69mm/21mm Area: 525cm2 inclusive of calcium ( Note Dr Weber Cooks measures 512cm2 and 464cm2 excluding calcium) Perimeter: 73mm  Aorta: No aneurysmal dilatation or dissection. Minor calcification on the minor surface of the arch and left subclavian take-off Normal arch vessel origins  Ascending Aorta: 3.5 cm STJ: 2.7 cm  Sinus Measurements:  NCS: 56mm RCS: 30 mm RCA height above valve plane 13 mm LCS: 33 mm LM height above valve plane 15 mm  Coronary Arteries:  LM- extremely calcified with distal 80% stenosis LAD- occluded in mid vessel with patent LIMA Circumflex- occluded proximally RCA: extremely calcified with patent stent in mid vessel  SVG RCA- occluded in mid vessel SVG IM- patent [place] LAD- patent  Impression:  1) Severely calcified trileaflet aortic valve. Unfavorable characteristics for TAVR include heavy calcification at the annulus extending from the left cusp into the intervalvular fibrosa and MAC with known mitral stenosis  2) Annular measurements noted above suitable for a 15mm Sapien Valve 3) No arch abnormalities that would affect transfemoral delivery 4) Best angiographic delivery angle LAO 24 degrees Cranial 11 degrees 5) Severe native CAD with occluded SVG to RCA and patent SVG to OM and LIMA to LAD Jenkins Rouge MD [facility]  CTA abdomen/pelvis: VASCULAR MEASUREMENTS PERTINENT TO TAVR:  AORTA:  Minimal Aortic Diameter - 15 x 14 mm  Severity of Aortic  Calcification - mild  RIGHT PELVIS:  Right Common Iliac Artery -  Minimal Diameter - 11.3 x 8.8 mm  Tortuosity - mild  Calcification - mild  Right External Iliac Artery -  Minimal Diameter - 10.3 x 10.2 mm  Tortuosity - moderate  Calcification - mild  Right Common Femoral Artery -  Minimal Diameter -  10.1 x 8.8 mm  Tortuosity - mild  Calcification - mild  LEFT PELVIS:  Left Common Iliac Artery -  Minimal Diameter - 9.2 x 10.7 mm  Tortuosity - mild  Calcification - mild  Left External Iliac Artery -  Minimal Diameter - 10.4 x 12.0 mm  Tortuosity - moderate  Calcification - none  Left Common Femoral Artery -  Minimal Diameter - 9.8 x 10.5 mm  Tortuosity - mild  Calcification - mild   STS Risk Calculator  Procedure AVR + redo CABG (does not include MVR)  Risk of Mortality 12.4%  Morbidity or Mortality 44%  Prolonged LOS 23%  Short LOS 13%  Permanent Stroke 2.6%  Prolonged Vent Support 34%  DSW Infection 1.6%  Renal Failure 19%  Reoperation 13%  Impression: 73 year old gentleman with severe symptomatic aortic stenosis, New York Heart Association class III congestive heart failure. Multiple severe comorbidities conditions include extensive coronary artery disease not amenable to revascularization, severe pulmonary hypertension, at least moderate mitral stenosis, stage III chronic kidney disease, obesity, and severe venous insufficiency with extensive stasis ulcerations.  Importantly, he had marked symptomatic improvement following balloon valvuloplasty. We are hopeful that TAVR will improve his heart failure and offer him mortality benefit as would be predicted. He is clearly failing medical therapy with progressive symptoms of heart failure as outlined above.  Plan:  We plan to proceed with transcatheter aortic valve replacement using the right femoral approach as an alternative to high risk conventional aortic valve replacement.  The patient  understands and accepts all potential associated risks of surgery including but not limited to risks of death, stroke, paravalvular leak, aortic dissection, other major vascular complications, aortic annulus rupture, device embolization, cardiac rupture or perforation, mitral regurgitation, acute myocardial infarction, arrhythmia, heart block or bradycardia requiring permanent pacemaker placement, congestive heart failure, respiratory failure, renal failure, pneumonia, infection, pleural effusion, pericardial effusion or tamponade, pulmonary embolus or other thromboembolic complications, late complications related to structural valve deterioration or migration, or other complications that might ultimately cause a temporary or permanent loss of functional independence or other long term morbidity.  Following the decision to proceed with transcatheter aortic valve replacement, a discussion has also been held regarding what types of management strategies would be attempted intraoperatively in the event of life-threatening complications, including whether or not the patient would be considered a candidate for the use of cardiopulmonary bypass and/or conversion to open sternotomy for attempted surgical intervention.  The patient provides full informed consent for the procedure as planned and all questions have been answered.   The patient expresses very clearly that if catastrophic or life-threatening complication were to occur during TAVR, he would not want to be treated with open cardiac surgery or redo sternotomy.  I have asked him to continue furosemide at his current dose until the day prior to surgery, then to hold the afternoon dose as well as the morning dose on the day of surgery. He was also instructed on Metformin. All questions were answered.  Blane Ohara, M.D.

## 2013-06-16 NOTE — Anesthesia Postprocedure Evaluation (Signed)
  Anesthesia Post-op Note  Patient: [name] [name].  Procedure(s) Performed: Procedure(s): TRANSCATHETER AORTIC VALVE REPLACEMENT, TRANSFEMORAL (N/A) INTRAOPERATIVE TRANSESOPHAGEAL ECHOCARDIOGRAM (N/A)  Patient Location: PACU  Anesthesia Type:General  Level of Consciousness: sedated and Patient remains intubated per anesthesia plan  Airway and Oxygen Therapy: Patient remains intubated per anesthesia plan  Post-op Pain: none  Post-op Assessment: Post-op Vital signs reviewed, Patient's Cardiovascular Status Stable, Respiratory Function Stable, Patent Airway, No signs of Nausea or vomiting and Pain level controlled  Post-op Vital Signs: Reviewed and stable  Last Vitals:  Filed Vitals:   06/16/13 1455  BP:   Pulse: 43  Temp:   Resp: 18    Complications: No apparent anesthesia complications

## 2013-06-16 NOTE — Procedures (Signed)
Extubation Procedure Note  Patient Details:   Name: [name] [name]. DOB: Sep 12, 1940 MRN: [id number]   Airway Documentation:     Evaluation  O2 sats: stable throughout Complications: No apparent complications Patient did tolerate procedure well. Bilateral Breath Sounds: Rhonchi Suctioning: Airway Yes Patient was extubated with no difficulty. Placed on a 4L [place], and was able to speak.  Cordella Register 06/16/2013, 3:46 PM

## 2013-06-16 NOTE — Progress Notes (Signed)
PM Rounds:  Pt seen earlier this evening. Sleepy but responds appropriately and able to follow commands. Started on low-dose dopamine for bradycardia and low CO. Given IV lasix because of hypoxemia (had received albumin and post-procedure fluids). Hemodynamically stable. HR up to 62 bpm, sinus rhythm.  [name] [name] 06/16/2013 11:51 PM

## 2013-06-16 NOTE — Progress Notes (Signed)
   Pt reports taking Lopressor 12.5mg  PTA at 0500 this morning; therefore, ordered dose not given.

## 2013-06-16 NOTE — Interval H&P Note (Signed)
History and Physical Interval Note:  06/16/2013 7:32 AM  Kavan A Candis Musa.  has presented today for surgery, with the diagnosis of SEVERE AS  The various methods of treatment have been discussed with the patient and family. After consideration of risks, benefits and other options for treatment, the patient has consented to  Procedure(s): TRANSCATHETER AORTIC VALVE REPLACEMENT, TRANSFEMORAL (N/A) INTRAOPERATIVE TRANSESOPHAGEAL ECHOCARDIOGRAM (N/A) as a surgical intervention .  The patient's history has been reviewed, patient examined, no change in status, stable for surgery.  I have reviewed the patient's chart and labs.  Questions were answered to the patient's satisfaction.    Pt just seen in office last week with plan as detailed above. No changes to add since the time of this note. Plan for right Transfemoral TAVR today.  [name]

## 2013-06-16 NOTE — Anesthesia Preprocedure Evaluation (Addendum)
Anesthesia Evaluation    Airway Mallampati: II TM Distance: >3 FB Neck ROM: Full    Dental  (+) Partial Lower, Teeth Intact, Dental Advisory Given   Pulmonary shortness of breath, sleep apnea ,          Cardiovascular hypertension, Pt. on home beta blockers + CAD, + CABG, + Peripheral Vascular Disease and +CHF     Neuro/Psych  Headaches,  Neuromuscular disease    GI/Hepatic hiatal hernia, GERD-  ,  Endo/Other  diabetes  Renal/GU      Musculoskeletal   Abdominal   Peds  Hematology   Anesthesia Other Findings   Reproductive/Obstetrics                          Anesthesia Physical Anesthesia Plan  ASA: IV  Anesthesia Plan: General   Post-op Pain Management:    Induction:   Airway Management Planned:   Additional Equipment:   Intra-op Plan:   Post-operative Plan:   Informed Consent:   Plan Discussed with:   Anesthesia Plan Comments:         Anesthesia Quick Evaluation

## 2013-06-16 NOTE — Anesthesia Procedure Notes (Signed)
Procedure Name: Intubation Date/Time: 06/16/2013 8:19 AM Performed by: Barrington Ellison Pre-anesthesia Checklist: Patient identified, Emergency Drugs available, Suction available, Patient being monitored and Timeout performed Patient Re-evaluated:Patient Re-evaluated prior to inductionOxygen Delivery Method: Circle system utilized Preoxygenation: Pre-oxygenation with 100% oxygen Intubation Type: IV induction Ventilation: Two handed mask ventilation required and Oral airway inserted - appropriate to patient size Laryngoscope Size: Mac and 3 Grade View: Grade III Tube type: Oral Tube size: 8.0 mm Number of attempts: 2 Airway Equipment and Method: Stylet and Video-laryngoscopy Placement Confirmation: ETT inserted through vocal cords under direct vision,  positive ETCO2 and breath sounds checked- equal and bilateral Secured at: 23 cm Tube secured with: Tape Dental Injury: Teeth and Oropharynx as per pre-operative assessment  Difficulty Due To: Difficulty was anticipated Future Recommendations: Recommend- induction with short-acting agent, and alternative techniques readily available Comments: DL x 1 with MAC 3, Grade III view, ETT passed into esophagus. DL x 1 with Glide Scope, Grade I view, ETT inserted atraumatically.

## 2013-06-16 NOTE — Progress Notes (Signed)
  Echocardiogram 2D Echocardiogram has been performed.  Valinda Hoar 06/16/2013, 10:41 AM

## 2013-06-16 NOTE — Op Note (Signed)
[facility]  TAVR OPERATIVE NOTE   Date of Procedure:  06/16/2013  Preoperative Diagnosis: Severe Aortic Stenosis   Postoperative Diagnosis: Same   Procedure:    Transcatheter Aortic Valve Replacement - Transfemoral Approach  Edwards Sapien XT THV (size 26 mm, model # 9300TFX, serial # [id number])   Co-Surgeons:  Valentina Gu. Roxy Manns, MD and [name], MD  Assistants:   Gaye Pollack, MD   Anesthesiologist:  Laurie Panda, MD  Echocardiographer:  Ena Dawley, MD  Pre-operative Echo Findings:  severe aortic stenosis  Mild global left ventricular systolic function  Severe mitral stenosis  Moderate RV dysfunction and severe pulmonary HTN  Post-operative Echo Findings:  Mild paravalvular leak  unchanged left ventricular systolic function   BRIEF CLINICAL NOTE AND INDICATIONS FOR SURGERY  [name] [name] is a 73 year old gentleman with extensive cardiovascular disease which has been outlined in multiple recent notes. In brief, he has extensive CAD and underwent 6 vessel CABG in 1998. He has also undergone PCI procedures. He's developed severe distal vessel diabetic coronary disease. At the time of most recent heart catheterization, he had continued patency of stents in his RCA but diffuse branch vessel disease, total occlusion of his LAD beyond the LIMA insertion site, and patency of the sequential saphenous vein graft to OM1 and OM 2.   The patient underwent balloon aortic valvuloplasty in February 2014 with a good result. He had marked improvement in his symptoms following this. He also underwent rotational atherectomy and stenting of his right coronary artery in March 2014. However, over the past 6 months he has developed progressive and severe shortness of breath once again. His most recent echocardiogram has demonstrated recurrent severe aortic stenosis. We have had extensive discussion about history main options, which include palliative medical care versus  open surgical double valve surgery, versus TAVR. Please see previous notes for details of this discussion. We have ultimately elected through our multidisciplinary clinic to proceed with TAVR.  The patient continues to have fatigue and shortness of breath with minimal activity. He has New York Heart Association class III symptoms. He began to worsen about 2 weeks ago, but feels a little better after increase of his furosemide dose. He denies chest pain, lightheadedness, or syncope.  During the course of the patient's preoperative work up they have been evaluated comprehensively by a multidisciplinary team of specialists coordinated through the [facility] in the [facility].  They have been demonstrated to suffer from symptomatic severe aortic stenosis as noted above. The patient has been counseled extensively as to the relative risks and benefits of all options for the treatment of severe aortic stenosis including long term medical therapy, conventional surgery for aortic valve replacement, and transcatheter aortic valve replacement.  The patient has been independently evaluated by two cardiac surgeons including Dr Roxy Manns and Dr. Cyndia Bent, and they are felt to be at high risk for conventional surgical aortic valve replacement based upon a predicted risk of mortality using the Society of Thoracic Surgeons risk calculator of 12.4%. Both surgeons indicated the patient would be a poor candidate for conventional surgery (predicted risk of mortality >15% and/or predicted risk of permanent morbidity >50%).   Based upon review of all of the patient's preoperative diagnostic tests they are felt to be candidate for transcatheter aortic valve replacement using the transfemoral approach as an alternative to high risk conventional surgery.    Following the decision to proceed with transcatheter aortic valve replacement, a  discussion has been held regarding what types of  management strategies would be attempted intraoperatively in the event of life-threatening complications, including whether or not the patient would be considered a candidate for the use of cardiopulmonary bypass and/or conversion to open sternotomy for attempted surgical intervention.  The patient has been advised of a variety of complications that might develop peculiar to this approach including but not limited to risks of death, stroke, paravalvular leak, aortic dissection or other major vascular complications, aortic annulus rupture, device embolization, cardiac rupture or perforation, acute myocardial infarction, arrhythmia, heart block or bradycardia requiring permanent pacemaker placement, congestive heart failure, respiratory failure, renal failure, pneumonia, infection, other late complications related to structural valve deterioration or migration, or other complications that might ultimately cause a temporary or permanent loss of functional independence or other long term morbidity.  The patient provides full informed consent for the procedure as described and all questions were answered preoperatively.    DETAILS OF THE OPERATIVE PROCEDURE  PREPARATION:    The patient is brought to the operating room on the above mentioned date and central monitoring was established by the anesthesia team including placement of Swan-Ganz catheter and radial arterial line. The patient is placed in the supine position on the operating table.  Intravenous antibiotics are administered. General endotracheal anesthesia is induced uneventfully. A Foley catheter is placed.  Baseline transesophageal echocardiogram was performed. The patient's chest, abdomen, both groins, and both lower extremities are prepared and draped in a sterile manner. A time out procedure is performed.   PERIPHERAL ACCESS:    Using the modified Seldinger technique, femoral arterial and venous access was obtained with placement of 6 Fr  sheaths on the left side.  A pigtail diagnostic catheter was passed through the left femoral arterial sheath under fluoroscopic guidance into the aortic root.  A temporary transvenous pacemaker catheter was passed through the left femoral venous sheath under fluoroscopic guidance into the right ventricle.  The pacemaker was tested to ensure stable lead placement and pacemaker capture. Aortic root angiography was performed in order to determine the optimal angiographic angle for valve deployment.   TRANSFEMORAL ACCESS:   A right femoral arterial cutdown was performed by Dr Roxy Manns. Please see his separate operative note for details. The patient was heparinized systemically and ACT verified > 250 seconds.    An 18 Fr transfemoral sheath was introduced into the right femoral artery after progressively dilating over an Amplatz superstiff wire. An AL-1 catheter was used to direct a straight-tip exchange length wire across the native aortic valve into the left ventricle. This was exchanged out for a pigtail catheter and position was confirmed in the LV apex. Simultaneous LV and Ao pressures were recorded.  The pigtail catheter was then exchanged for an Amplatz Extra-stiff wire in the LV apex. At that point, BAV was performed using a 23 mm valvuloplasty balloon.  Once optimal position was achieved, BAV was done under rapid ventricular pacing at 180 bpm. The patient recovered well hemodynamically.   TRANSCATHETER HEART VALVE DEPLOYMENT:  An Edwards Sapien XT THV (size 26 mm) was prepared and crimped per manufacturer's guidelines, and the proper orientation of the valve is confirmed on the [employer] delivery system. The valve was advanced through the introducer sheath using normal technique until in an appropriate position in the abdominal aorta beyond the sheath tip. The balloon was then retracted and using the fine-tuning wheel was centered on the valve. The valve was then advanced across the aortic arch  using appropriate flexion of the catheter. The valve was carefully positioned across the aortic valve annulus. The Novaflex catheter was retracted using normal technique. Once final position of the valve has been confirmed by angiographic assessment, the valve is deployed while temporarily holding ventilation and during rapid ventricular pacing to maintain systolic blood pressure < 50 mmHg and pulse pressure < 10 mmHg. The balloon inflation is held for >3 seconds after reaching full deployment volume. Once the balloon has fully deflated the balloon is retracted into the ascending aorta and valve function is assessed using TEE. There is felt to be mild paravalvular leak and no central aortic insufficiency.  The patient's hemodynamic recovery following valve deployment is good.  The deployment balloon and guidewire are both removed. Echo demostrated acceptable post-procedural gradients, stable mitral valve function, and mild perivalvular AI. Aortography was not performed because of chronic kidney disease.  PROCEDURE COMPLETION:  The sheath was then removed and arteriotomy repaired by Dr Roxy Manns. Please see his separate report for details. Distal abdominal aortography was deferred because of CKD.  Of note, there was no resistance to dilators and sheath insertion.  Protamine was administered once femoral arterial repair was complete. The temporary pacemaker, pigtail catheters and femoral sheaths were removed with manual pressure used for hemostasis.   The patient tolerated the procedure well and is transported to the surgical intensive care in stable condition. There were no immediate intraoperative complications. All sponge instrument and needle counts are verified correct at completion of the operation.   No blood products were administered during the operation.  The patient received a total of 56 mL of intravenous contrast during the procedure.  [name] 06/16/2013 11:24 AM

## 2013-06-17 ENCOUNTER — Inpatient Hospital Stay (HOSPITAL_COMMUNITY): Payer: Medicare Other

## 2013-06-17 ENCOUNTER — Encounter (HOSPITAL_COMMUNITY): Payer: Self-pay | Admitting: Cardiovascular Disease

## 2013-06-17 DIAGNOSIS — I5033 Acute on chronic diastolic (congestive) heart failure: Secondary | ICD-10-CM

## 2013-06-17 DIAGNOSIS — I359 Nonrheumatic aortic valve disorder, unspecified: Secondary | ICD-10-CM

## 2013-06-17 DIAGNOSIS — I059 Rheumatic mitral valve disease, unspecified: Secondary | ICD-10-CM

## 2013-06-17 LAB — GLUCOSE, CAPILLARY
GLUCOSE-CAPILLARY: 230 mg/dL — AB (ref 70–99)
GLUCOSE-CAPILLARY: 73 mg/dL (ref 70–99)
GLUCOSE-CAPILLARY: 74 mg/dL (ref 70–99)
GLUCOSE-CAPILLARY: 76 mg/dL (ref 70–99)
GLUCOSE-CAPILLARY: 78 mg/dL (ref 70–99)
Glucose-Capillary: 76 mg/dL (ref 70–99)
Glucose-Capillary: 81 mg/dL (ref 70–99)
Glucose-Capillary: 81 mg/dL (ref 70–99)
Glucose-Capillary: 76 mg/dL (ref 70–99)
Glucose-Capillary: 67 mg/dL — ABNORMAL LOW (ref 70–99)
Glucose-Capillary: 119 mg/dL — ABNORMAL HIGH (ref 70–99)
Glucose-Capillary: 140 mg/dL — ABNORMAL HIGH (ref 70–99)
Glucose-Capillary: 150 mg/dL — ABNORMAL HIGH (ref 70–99)
Glucose-Capillary: 136 mg/dL — ABNORMAL HIGH (ref 70–99)
Glucose-Capillary: 160 mg/dL — ABNORMAL HIGH (ref 70–99)
Glucose-Capillary: 185 mg/dL — ABNORMAL HIGH (ref 70–99)
Glucose-Capillary: 222 mg/dL — ABNORMAL HIGH (ref 70–99)

## 2013-06-17 LAB — CREATININE, SERUM
Creatinine, Ser: 1.53 mg/dL — ABNORMAL HIGH (ref 0.50–1.35)
GFR calc Af Amer: 51 mL/min — ABNORMAL LOW (ref >90)
GFR calc non Af Amer: 44 mL/min — ABNORMAL LOW (ref >90)

## 2013-06-17 LAB — MAGNESIUM
MAGNESIUM: 2.5 mg/dL (ref 1.5–2.5)
Magnesium: 2.5 mg/dL (ref 1.5–2.5)

## 2013-06-17 LAB — CBC
HCT: 31.4 % — ABNORMAL LOW (ref 39.0–52.0)
HCT: 32.5 % — ABNORMAL LOW (ref 39.0–52.0)
Hemoglobin: 10.2 g/dL — ABNORMAL LOW (ref 13.0–17.0)
Hemoglobin: 10.2 g/dL — ABNORMAL LOW (ref 13.0–17.0)
MCH: 27.1 pg (ref 26.0–34.0)
MCH: 26.6 pg (ref 26.0–34.0)
MCHC: 32.5 g/dL (ref 30.0–36.0)
MCHC: 31.4 g/dL (ref 30.0–36.0)
MCV: 83.3 fL (ref 78.0–100.0)
MCV: 84.6 fL (ref 78.0–100.0)
PLATELETS: 219 10*3/uL (ref 150–400)
PLATELETS: 182 10*3/uL (ref 150–400)
RBC: 3.77 MIL/uL — AB (ref 4.22–5.81)
RBC: 3.84 MIL/uL — ABNORMAL LOW (ref 4.22–5.81)
RDW: 18 % — ABNORMAL HIGH (ref 11.5–15.5)
RDW: 18.1 % — AB (ref 11.5–15.5)
WBC: 9.5 10*3/uL (ref 4.0–10.5)
WBC: 9.4 10*3/uL (ref 4.0–10.5)

## 2013-06-17 LAB — BASIC METABOLIC PANEL
BUN: 29 mg/dL — ABNORMAL HIGH (ref 6–23)
CALCIUM: 8.9 mg/dL (ref 8.4–10.5)
CO2: 23 meq/L (ref 19–32)
CREATININE: 1.66 mg/dL — AB (ref 0.50–1.35)
Chloride: 103 mEq/L (ref 96–112)
GFR calc Af Amer: 47 mL/min — ABNORMAL LOW (ref >90)
GFR, EST NON AFRICAN AMERICAN: 40 mL/min — AB (ref >90)
Glucose, Bld: 79 mg/dL (ref 70–99)
Potassium: 4.2 mEq/L (ref 3.7–5.3)
Sodium: 140 mEq/L (ref 137–147)

## 2013-06-17 MED ORDER — FUROSEMIDE 10 MG/ML IJ SOLN
80.0000 mg | Freq: Two times a day (BID) | INTRAMUSCULAR | Status: DC
  Administered 2013-06-17 – 2013-06-21 (×9): 80 mg via INTRAVENOUS
  Filled 2013-06-17 (×13): qty 8

## 2013-06-17 MED ORDER — BISACODYL 10 MG RE SUPP
10.0000 mg | Freq: Every day | RECTAL | Status: DC | PRN
  Administered 2013-06-18: 10 mg via RECTAL
  Filled 2013-06-17: qty 1

## 2013-06-17 MED ORDER — DEXTROSE 50 % IV SOLN
INTRAVENOUS | Status: AC
  Administered 2013-06-17: 13 mL
  Filled 2013-06-17: qty 50

## 2013-06-17 MED ORDER — MORPHINE SULFATE 2 MG/ML IJ SOLN: 2.0000 mg | INTRAMUSCULAR | Status: DC | PRN

## 2013-06-17 MED ORDER — INSULIN ASPART 100 UNIT/ML ~~LOC~~ SOLN
0.0000 [IU] | SUBCUTANEOUS | Status: DC
  Administered 2013-06-17: 4 [IU] via SUBCUTANEOUS
  Administered 2013-06-17: 8 [IU] via SUBCUTANEOUS
  Administered 2013-06-17: 2 [IU] via SUBCUTANEOUS
  Administered 2013-06-18 (×2): 8 [IU] via SUBCUTANEOUS

## 2013-06-17 MED FILL — Dextrose Inj 5%: INTRAVENOUS | Qty: 250 | Status: AC

## 2013-06-17 MED FILL — Phenylephrine HCl Inj 10 MG/ML: INTRAMUSCULAR | Qty: 2 | Status: AC

## 2013-06-17 MED FILL — Magnesium Sulfate Inj 50%: INTRAMUSCULAR | Qty: 10 | Status: AC

## 2013-06-17 MED FILL — Potassium Chloride Inj 2 mEq/ML: INTRAVENOUS | Qty: 40 | Status: AC

## 2013-06-17 MED FILL — Heparin Sodium (Porcine) Inj 1000 Unit/ML: INTRAMUSCULAR | Qty: 30 | Status: AC

## 2013-06-17 NOTE — Progress Notes (Signed)
Patient ID: [name] Kinds., male   DOB: Mar 12, 1940, 73 y.o.   MRN: [id number]  SICU Evening Rounds:  Hemodynamically stable, on dop 3  Sinus 70   Diuresed 1085 cc today  BMET    Component Value Date/Time   NA 140 06/17/2013 0405   K 4.2 06/17/2013 0405   CL 103 06/17/2013 0405   CO2 23 06/17/2013 0405   GLUCOSE 79 06/17/2013 0405   BUN 29* 06/17/2013 0405   CREATININE 1.53* 06/17/2013 1700   CREATININE 1.99* 03/30/2013 1225   CALCIUM 8.9 06/17/2013 0405   GFRNONAA 44* 06/17/2013 1700   GFRAA 51* 06/17/2013 1700    Awake and alert up in chair. Feeling better

## 2013-06-17 NOTE — Progress Notes (Signed)
      [address]       [place], [address]             [phone]        CARDIOTHORACIC SURGERY PROGRESS NOTE   R1 Day Post-Op Procedure(s) (LRB): TRANSCATHETER AORTIC VALVE REPLACEMENT, TRANSFEMORAL (N/A) INTRAOPERATIVE TRANSESOPHAGEAL ECHOCARDIOGRAM (N/A)  Subjective: Looks good and feels well.  Very mild soreness in groin.  Denies SOB or chest pain.  Objective: Vital signs: BP Readings from Last 1 Encounters:  06/17/13 121/48   Pulse Readings from Last 1 Encounters:  06/17/13 50   Resp Readings from Last 1 Encounters:  06/17/13 20   Temp Readings from Last 1 Encounters:  06/17/13 97.9 F (36.6 C) Oral    Hemodynamics: PAP: (63-111)/(24-40) 91/28 mmHg CO:  [2.4 L/min-6.6 L/min] 5.6 L/min CI:  [1.1 L/min/m2-3 L/min/m2] 2.5 L/min/m2  Physical Exam:  Rhythm:   sinus  Breath sounds: clear  Heart sounds:  RRR w/out murmur  Incisions:  Dressing dry, intact  Abdomen:  Soft, non-distended, non-tender  Extremities:  Warm, well-perfused    Intake/Output from previous day: 04/14 0701 - 04/15 0700 In: 4654.3 [P.O.:480; I.V.:3224.3; IV Piggyback:950] Out: 2240 [Urine:2160; Blood:80] Intake/Output this shift: Total I/O In: 46 [I.V.:46] Out: 100 [Urine:100]  Lab Results:  CBC: Recent Labs  06/16/13 1800 06/16/13 1809 06/17/13 0405  WBC 10.6*  --  9.5  HGB 10.7* 11.2* 10.2*  HCT 33.9* 33.0* 31.4*  PLT 222  --  219    BMET:  Recent Labs  06/16/13 1809 06/17/13 0405  NA 139 140  K 4.8 4.2  CL 105 103  CO2  --  23  GLUCOSE 124* 79  BUN 31* 29*  CREATININE 1.90* 1.66*  CALCIUM  --  8.9     CBG (last 3)   Recent Labs  06/17/13 0459 06/17/13 0602 06/17/13 0651  GLUCAP 78 81 76    ABG    Component Value Date/Time   PHART 7.350 06/16/2013 1859   PCO2ART 41.6 06/16/2013 1859   PO2ART 58.0* 06/16/2013 1859   HCO3 23.1 06/16/2013 1859   TCO2 24 06/16/2013 1859   ACIDBASEDEF 3.0* 06/16/2013 1859   O2SAT 89.0 06/16/2013 1859     CXR: PORTABLE CHEST - 1 VIEW  COMPARISON: June 16, 2013  FINDINGS:  There is patchy opacity in the left lower lobe, primarily  representing atelectatic change. Lungs elsewhere clear. Heart is  enlarged with pulmonary vascularity within normal limits. No  adenopathy. Endotracheal tube and nasogastric tube have been  removed. Swan-Ganz catheter tip is in the main pulmonary outflow  tract. Patient is status post aortic valve replacement.  IMPRESSION:  Atelectasis with possible patchy infiltrate left lower lobe. Lungs  elsewhere clear. No pneumothorax. Cardiomegaly stable. Central  catheter tip in main pulmonary outflow tract. Endotracheal tube and  nasogastric tube removed.  Electronically Signed  By: Lowella Grip M.D.  On: 06/17/2013 07:32    Assessment/Plan: S/P Procedure(s) (LRB): TRANSCATHETER AORTIC VALVE REPLACEMENT, TRANSFEMORAL (N/A) INTRAOPERATIVE TRANSESOPHAGEAL ECHOCARDIOGRAM (N/A)  Doing well POD1 Maintaining NSR w/ stable hemodynamics Moderate hypoxemia with increased O2 requirement, likely due to mild acute exacerbation of chronic diastolic CHF + atelectasis   Mobilize  Agree w/ plans for diuresis  D/C swan and Aline  Would favor keeping dopamine for now but wean it off as HR increases  Routine f/u ECHO today   Rexene Alberts 06/17/2013 8:41 AM

## 2013-06-17 NOTE — Progress Notes (Signed)
    Subjective:  Feels ok this morning. Soreness right groin, otherwise no complaints. No CP or dyspnea. Placed on NRB last night for hypoxemia and has been unable to wean off.  Objective:  Vital Signs in the last 24 hours: Temp:  [96.8 F (36 C)-98.6 F (37 C)] 97.7 F (36.5 C) (04/15 0700) Pulse Rate:  [41-65] 58 (04/15 0700) Resp:  [14-30] 20 (04/15 0700) BP: (108-149)/(45-55) 112/49 mmHg (04/15 0700) SpO2:  [87 %-100 %] 97 % (04/15 0700) Arterial Line BP: (102-179)/(41-66) 123/48 mmHg (04/15 0700) FiO2 (%):  [40 %-60 %] 60 % (04/15 0400) Weight:  [236 lb (107.049 kg)-242 lb 1 oz (109.8 kg)] 242 lb 1 oz (109.8 kg) (04/15 0500)  Intake/Output from previous day: 04/14 0701 - 04/15 0700 In: 4654.3 [P.O.:480; I.V.:3224.3; IV Piggyback:950] Out: 2240 [Urine:2160; Blood:80]  Physical Exam: Pt is alert and oriented, NAD HEENT: normal Neck: JVP - difficult to evaluate Lungs: CTA anteriorly CV: RRR without murmur or gallop Abd: soft, NT, Positive BS, no hepatomegaly Ext: 2+ edema, distal pulses intact and equal  Lab Results:  Recent Labs  06/16/13 1800 06/16/13 1809 06/17/13 0405  WBC 10.6*  --  9.5  HGB 10.7* 11.2* 10.2*  PLT 222  --  219    Recent Labs  06/16/13 1809 06/17/13 0405  NA 139 140  K 4.8 4.2  CL 105 103  CO2  --  23  GLUCOSE 124* 79  BUN 31* 29*  CREATININE 1.90* 1.66*   No results found for this basename: TROPONINI, CK, MB,  in the last 72 hours  Tele: Sinus rhythm, personally reviewed. HR 60 bpm  Assessment/Plan:  1. Severe aortic stenosis POD#1 from Transfemoral TAVR 2. 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 CKD, stage 3, stable 4. Severe pulmonary HTN  Overall doing well. CXR reviewed and shows mild edema. Pt requiring NRB to maintain adequate O2 sats. He is 2.5 liters positive from surgery, so will diurese today with IV lasix. Hold metoprolol in setting of bradycardia. Plan continue dopa 3 mcg today to help  diuresis/heart rate. Probably d/c swan and A-line later this am as hemodynamics now stable. Keep in SICU today.  [name], M.D. 06/17/2013, 7:21 AM

## 2013-06-18 DIAGNOSIS — I359 Nonrheumatic aortic valve disorder, unspecified: Secondary | ICD-10-CM

## 2013-06-18 LAB — GLUCOSE, CAPILLARY
GLUCOSE-CAPILLARY: 202 mg/dL — AB (ref 70–99)
GLUCOSE-CAPILLARY: 245 mg/dL — AB (ref 70–99)
Glucose-Capillary: 245 mg/dL — ABNORMAL HIGH (ref 70–99)
Glucose-Capillary: 329 mg/dL — ABNORMAL HIGH (ref 70–99)
Glucose-Capillary: 247 mg/dL — ABNORMAL HIGH (ref 70–99)

## 2013-06-18 LAB — CBC
HCT: 31.6 % — ABNORMAL LOW (ref 39.0–52.0)
HEMOGLOBIN: 10 g/dL — AB (ref 13.0–17.0)
MCH: 26.9 pg (ref 26.0–34.0)
MCHC: 31.6 g/dL (ref 30.0–36.0)
MCV: 84.9 fL (ref 78.0–100.0)
Platelets: 193 10*3/uL (ref 150–400)
RBC: 3.72 MIL/uL — AB (ref 4.22–5.81)
RDW: 18.3 % — ABNORMAL HIGH (ref 11.5–15.5)
WBC: 7.9 10*3/uL (ref 4.0–10.5)

## 2013-06-18 LAB — BASIC METABOLIC PANEL
BUN: 27 mg/dL — ABNORMAL HIGH (ref 6–23)
CHLORIDE: 103 meq/L (ref 96–112)
CO2: 23 meq/L (ref 19–32)
Calcium: 8.8 mg/dL (ref 8.4–10.5)
Creatinine, Ser: 1.48 mg/dL — ABNORMAL HIGH (ref 0.50–1.35)
GFR calc Af Amer: 54 mL/min — ABNORMAL LOW (ref >90)
GFR calc non Af Amer: 46 mL/min — ABNORMAL LOW (ref >90)
GLUCOSE: 214 mg/dL — AB (ref 70–99)
Potassium: 4.5 mEq/L (ref 3.7–5.3)
SODIUM: 139 meq/L (ref 137–147)

## 2013-06-18 MED ORDER — MOVING RIGHT ALONG BOOK
Freq: Once | Status: AC
  Administered 2013-06-18: 08:00:00
  Filled 2013-06-18: qty 1

## 2013-06-18 MED ORDER — INSULIN DETEMIR 100 UNIT/ML ~~LOC~~ SOLN
20.0000 [IU] | Freq: Every day | SUBCUTANEOUS | Status: DC
  Administered 2013-06-18: 20 [IU] via SUBCUTANEOUS
  Filled 2013-06-18 (×2): qty 0.2

## 2013-06-18 MED ORDER — INSULIN ASPART 100 UNIT/ML ~~LOC~~ SOLN
0.0000 [IU] | Freq: Three times a day (TID) | SUBCUTANEOUS | Status: DC
  Administered 2013-06-18: 11 [IU] via SUBCUTANEOUS
  Administered 2013-06-18 (×2): 5 [IU] via SUBCUTANEOUS
  Administered 2013-06-19: 15 [IU] via SUBCUTANEOUS
  Administered 2013-06-19: 11 [IU] via SUBCUTANEOUS
  Administered 2013-06-19: 8 [IU] via SUBCUTANEOUS
  Administered 2013-06-20: 5 [IU] via SUBCUTANEOUS
  Administered 2013-06-20: 8 [IU] via SUBCUTANEOUS
  Administered 2013-06-20: 15 [IU] via SUBCUTANEOUS
  Administered 2013-06-21: 5 [IU] via SUBCUTANEOUS
  Administered 2013-06-21: 8 [IU] via SUBCUTANEOUS

## 2013-06-18 NOTE — Care Management Note (Signed)
    Page 1 of 1   06/18/2013     12:15:14 PM   CARE MANAGEMENT NOTE 06/18/2013  Patient:  [name] [name], [name] [name]   Account Number:  [id number]  Date Initiated:  06/16/2013  Documentation initiated by:  Jaena Brocato  Subjective/Objective Assessment:   dx aortic stenosis s/p TAVR; lives with spouse, independent PTA    PCP Dr [name]     DC Planning Services  CM consult      Per UR Regulation:  Reviewed for med. necessity/level of care/duration of stay

## 2013-06-18 NOTE — Progress Notes (Signed)
      [address]       [place] [address]             [phone]        CARDIOTHORACIC SURGERY PROGRESS NOTE   R2 Days Post-Op Procedure(s) (LRB): TRANSCATHETER AORTIC VALVE REPLACEMENT, TRANSFEMORAL (N/A) INTRAOPERATIVE TRANSESOPHAGEAL ECHOCARDIOGRAM (N/A)  Subjective: Feels okay and denies SOB but feels like he needs to get up and get moving.  Some mild soreness right groin.  Objective: Vital signs: BP Readings from Last 1 Encounters:  06/18/13 150/59   Pulse Readings from Last 1 Encounters:  06/18/13 68   Resp Readings from Last 1 Encounters:  06/18/13 21   Temp Readings from Last 1 Encounters:  06/18/13 97.2 F (36.2 C) Oral    Hemodynamics: PAP: (79-92)/(27-29) 84/29 mmHg  Physical Exam:  Rhythm:   sinus  Breath sounds: clear  Heart sounds:  RRR w/out murmur  Incisions:  Clean and dry  Abdomen:  Soft, non-distended, non-tender  Extremities:  Warm, well-perfused    Intake/Output from previous day: 04/15 0701 - 04/16 0700 In: 861 [I.V.:761; IV Piggyback:100] Out: 2955 [Urine:2955] Intake/Output this shift:    Lab Results:  CBC: Recent Labs  06/17/13 1700 06/18/13 0430  WBC 9.4 7.9  HGB 10.2* 10.0*  HCT 32.5* 31.6*  PLT 182 193    BMET:  Recent Labs  06/17/13 0405 06/17/13 1700 06/18/13 0430  NA 140  --  139  K 4.2  --  4.5  CL 103  --  103  CO2 23  --  23  GLUCOSE 79  --  214*  BUN 29*  --  27*  CREATININE 1.66* 1.53* 1.48*  CALCIUM 8.9  --  8.8     CBG (last 3)   Recent Labs  06/17/13 1947 06/17/13 2347 06/18/13 0340  GLUCAP 222* 230* 202*    ABG    Component Value Date/Time   PHART 7.350 06/16/2013 1859   PCO2ART 41.6 06/16/2013 1859   PO2ART 58.0* 06/16/2013 1859   HCO3 23.1 06/16/2013 1859   TCO2 24 06/16/2013 1859   ACIDBASEDEF 3.0* 06/16/2013 1859   O2SAT 89.0 06/16/2013 1859    CXR: n/a  Assessment/Plan: S/P Procedure(s) (LRB): TRANSCATHETER AORTIC VALVE REPLACEMENT, TRANSFEMORAL  (N/A) INTRAOPERATIVE TRANSESOPHAGEAL ECHOCARDIOGRAM (N/A)  Overall doing remarkably well POD2 Maintaining NSR w/ stable BP Oxygenation stable on O2 6 L/min via [place] Renal function stable and diuresing some Likely type II diabetes w/ pre-op Hgb a1c 7.3 and mild hyperglycemia   Probably can wean dopamine off and d/c central line  Prefer to d/c foley and mobilize but still needs diuresis  Consider transfer to step-down   Change CBG's and SSI to ac/hs and add levemir insulin for now  [name] [name] 06/18/2013 8:09 AM

## 2013-06-18 NOTE — Progress Notes (Signed)
    Subjective:  Feels much better today. No dyspnea or chest pain. He is fatigued.  Objective:  Vital Signs in the last 24 hours: Temp:  [97.2 F (36.2 C)-98.7 F (37.1 C)] 98 F (36.7 C) (04/16 1206) Pulse Rate:  [45-85] 64 (04/16 1100) Resp:  [16-25] 19 (04/16 1100) BP: (90-158)/(44-89) 124/50 mmHg (04/16 1100) SpO2:  [90 %-100 %] 100 % (04/16 1100) Arterial Line BP: (93-139)/(44-55) 139/55 mmHg (04/15 1700) Weight:  [242 lb 15.2 oz (110.2 kg)] 242 lb 15.2 oz (110.2 kg) (04/16 0448)  Intake/Output from previous day: 04/15 0701 - 04/16 0700 In: 861 [I.V.:761; IV Piggyback:100] Out: 2955 [Urine:2955]  Physical Exam: Pt is alert and oriented, NAD HEENT: normal Neck: JVP - unable to visualize Lungs: CTA bilaterally CV: RRR without murmur or gallop Abd: soft, NT, Positive BS, no hepatomegaly Ext: improved edema  Lab Results:  Recent Labs  06/17/13 1700 06/18/13 0430  WBC 9.4 7.9  HGB 10.2* 10.0*  PLT 182 193    Recent Labs  06/17/13 0405 06/17/13 1700 06/18/13 0430  NA 140  --  139  K 4.2  --  4.5  CL 103  --  103  CO2 23  --  23  GLUCOSE 79  --  214*  BUN 29*  --  27*  CREATININE 1.66* 1.53* 1.48*   No results found for this basename: TROPONINI, CK, MB,  in the last 72 hours  Cardiac Studies: 2D Echo: Left ventricle: The cavity size was normal. There was severe concentric hypertrophy. Systolic function was normal. The estimated ejection fraction was in the range of 60% to 65%.  ------------------------------------------------------------ Aortic valve: Normal thickness leaflets. Doppler: There was very mild stenosis. No regurgitation. VTI ratio of LVOT to aortic valve: 0.67. Peak velocity ratio of LVOT to aortic valve: 0.66. Mean gradient: 37mm Hg (S). Peak gradient: 28mm Hg (S).  ------------------------------------------------------------ Aorta: The aorta was normal, not dilated, and  non-diseased.  ------------------------------------------------------------ Mitral valve: Calcified annulus. Doppler: The findings are consistent with moderate stenosis. Valve area by pressure half-time: 1.15cm^2. Indexed valve area by pressure half-time: 0.51cm^2/m^2. Mean gradient: 36mm Hg (D). Peak gradient: 87mm Hg (D).  ------------------------------------------------------------ Left atrium: The atrium was moderately dilated.  ------------------------------------------------------------ Right ventricle: The cavity size was mildly dilated. Systolic function was moderately reduced.  ------------------------------------------------------------ Pulmonic valve: Mildly thickened leaflets. Doppler: No significant regurgitation.  ------------------------------------------------------------ Tricuspid valve: Mildly thickened leaflets. Doppler: Trivial regurgitation.  ------------------------------------------------------------ Right atrium: The atrium was severely dilated.  ------------------------------------------------------------ Pericardium: There was no pericardial effusion.  ------------------------------------------------------------ Systemic veins: Inferior vena cava: The vessel was dilated; the respirophasic diameter changes were blunted (< 50%); findings are consistent with elevated central venous pressure.  ------------------------------------------------------------ Post procedure conclusions Ascending Aorta:  - The aorta was normal, not dilated, and non-diseased.  Tele: Personally reviewed, sinus rhythm  Assessment/Plan:  1. 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 CKD, stage 3, stable  4. Severe pulmonary HTN  5. Type 2 DM  Looks good today. Echo reviewed and shows normal prosthetic valve function. Continue IV diuresis today and transition to PO lasix tomorrow. Tx stepdown  bed. Stop dopamine and d/c foley/lines. No beta-blocker with bradycardia.  [name], M.D. 06/18/2013, 12:38 PM

## 2013-06-19 LAB — GLUCOSE, CAPILLARY
GLUCOSE-CAPILLARY: 287 mg/dL — AB (ref 70–99)
Glucose-Capillary: 392 mg/dL — ABNORMAL HIGH (ref 70–99)
Glucose-Capillary: 384 mg/dL — ABNORMAL HIGH (ref 70–99)
Glucose-Capillary: 329 mg/dL — ABNORMAL HIGH (ref 70–99)

## 2013-06-19 LAB — BASIC METABOLIC PANEL
BUN: 34 mg/dL — AB (ref 6–23)
CHLORIDE: 99 meq/L (ref 96–112)
CO2: 24 mEq/L (ref 19–32)
Calcium: 9.4 mg/dL (ref 8.4–10.5)
Creatinine, Ser: 1.7 mg/dL — ABNORMAL HIGH (ref 0.50–1.35)
GFR calc Af Amer: 45 mL/min — ABNORMAL LOW (ref >90)
GFR calc non Af Amer: 39 mL/min — ABNORMAL LOW (ref >90)
Glucose, Bld: 259 mg/dL — ABNORMAL HIGH (ref 70–99)
Potassium: 5.5 mEq/L — ABNORMAL HIGH (ref 3.7–5.3)
Sodium: 137 mEq/L (ref 137–147)

## 2013-06-19 LAB — CBC
HCT: 34.1 % — ABNORMAL LOW (ref 39.0–52.0)
Hemoglobin: 10.3 g/dL — ABNORMAL LOW (ref 13.0–17.0)
MCH: 26.2 pg (ref 26.0–34.0)
MCHC: 30.2 g/dL (ref 30.0–36.0)
MCV: 86.8 fL (ref 78.0–100.0)
Platelets: 190 10*3/uL (ref 150–400)
RBC: 3.93 MIL/uL — ABNORMAL LOW (ref 4.22–5.81)
RDW: 18.3 % — AB (ref 11.5–15.5)
WBC: 7.3 10*3/uL (ref 4.0–10.5)

## 2013-06-19 MED ORDER — LINAGLIPTIN 5 MG PO TABS
5.0000 mg | ORAL_TABLET | Freq: Every day | ORAL | Status: DC
  Administered 2013-06-19 – 2013-06-21 (×3): 5 mg via ORAL
  Filled 2013-06-19 (×3): qty 1

## 2013-06-19 MED ORDER — INSULIN DETEMIR 100 UNIT/ML ~~LOC~~ SOLN
40.0000 [IU] | Freq: Every day | SUBCUTANEOUS | Status: DC
  Administered 2013-06-19 – 2013-06-21 (×3): 40 [IU] via SUBCUTANEOUS
  Filled 2013-06-19 (×4): qty 0.4

## 2013-06-19 NOTE — Progress Notes (Signed)
Inpatient Diabetes Program Recommendations  AACE/ADA: New Consensus Statement on Inpatient Glycemic Control (2013)  Target Ranges:  Prepandial:   less than 140 mg/dL      Peak postprandial:   less than 180 mg/dL (1-2 hours)      Critically ill patients:  140 - 180 mg/dL  Results for [name] [name], [name] [name] (MRN [id number]) as of 06/19/2013 10:17  Ref. Range 06/18/2013 08:03 06/18/2013 12:03 06/18/2013 16:00 06/18/2013 21:34 06/19/2013 06:39  Glucose-Capillary Latest Range: 70-99 mg/dL 245 (H) 245 (H) 329 (H) 247 (H) 392 (H)   Inpatient Diabetes Program Recommendations Correction (SSI): consider increase to resistant scale  Thank you  Raoul Pitch BSN, RN,CDE Inpatient Diabetes Coordinator [phone] (team pager)

## 2013-06-19 NOTE — Progress Notes (Signed)
CARDIAC REHAB PHASE I   PRE:  Rate/Rhythm: 69 SR    BP: sitting 124/71    SaO2: 93 3L  MODE:  Ambulation: 80 ft   POST:  Rate/Rhythm: 79 SR    BP: sitting 93/51     SaO2: 92 RA  Seemingly this was pts first walk in hall. Used 2L first 40 ft. Also RW, assist x1. Pt DOE, weak legs and hips, wanted to sit at 40 ft.  SaO2 93-98 2L. Pt rested 10 min before he was ready to walk back to room. Sts he basically walks 30-40 ft at a time at home. Has places to sit and rest where needed. Pt ambulated back to room on RA and SaO2 92 RA once sitting in recliner. Left O2 off in recliner, maintaining 93 ra. Noted BP substantially lower after walk. Pt sts this was a problem PTA. Denies dizziness walking today, just leg weakness and SOB. Encouraged x2 more walks today with nursing. [phone]  Wilder, ACSM 06/19/2013 9:57 AM

## 2013-06-19 NOTE — Progress Notes (Signed)
    Subjective:  Feels ok. Has been up to bathroom but has not walked much yet. Fatigued. Mild dyspnea.   Objective:  Vital Signs in the last 24 hours: Temp:  [97.2 F (36.2 C)-98 F (36.7 C)] 98 F (36.7 C) (04/17 0610) Pulse Rate:  [45-73] 62 (04/17 0610) Resp:  [16-25] 20 (04/17 0610) BP: (123-149)/(50-73) 138/73 mmHg (04/17 0610) SpO2:  [89 %-100 %] 94 % (04/17 0610) Weight:  [244 lb 11.4 oz (111 kg)] 244 lb 11.4 oz (111 kg) (04/17 0418)  Intake/Output from previous day: 04/16 0701 - 04/17 0700 In: 224 [I.V.:224] Out: 2710 [Urine:2710]  Physical Exam: Pt is alert and oriented, NAD HEENT: normal Neck: carotids 2+=  Lungs: CTA bilaterally CV: RRR without murmur or gallop Abd: soft, NT, Positive BS, no hepatomegaly Ext: 1+ edema, stable from yesterday Skin: marked stasis changes with ulceration, chronic and unchanged   Lab Results:  Recent Labs  06/18/13 0430 06/19/13 0426  WBC 7.9 7.3  HGB 10.0* 10.3*  PLT 193 190    Recent Labs  06/18/13 0430 06/19/13 0426  NA 139 137  K 4.5 5.5*  CL 103 99  CO2 23 24  GLUCOSE 214* 259*  BUN 27* 34*  CREATININE 1.48* 1.70*   No results found for this basename: TROPONINI, CK, MB,  in the last 72 hours  Tele: Sinus rhythm, personally reviewed. Heart rate 64 bpm  Assessment/Plan:  1. 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 CKD, stage 3, stable  4. Severe pulmonary HTN  5. Type 2 DM - CBG's 202-329 6. Hyperkalemia  Appears stable. I think he would benefit from another 24 hours in the hospital to work with cardiac rehab, diurese further, and improve glycemic control. Plan:  Continue ASA/plavix  IV lasix again today, change back to oral lasix in am  Repeat BMET in am, ? True hyperkalemia vs hemolysis. He is on no potassium supplementation  Resume metoprolol 12.5 mg BID  Add back Januvia and increase Levemir to 40 units  I think  best to stay off of Metformin considering his renal impairment  Anticipate discharge tomorrow  Will arrange 30 day follow-up, but he should have a 1 week transition-of-care visit for close CHF follow-up  [name], M.D. 06/19/2013, 6:51 AM

## 2013-06-19 NOTE — Progress Notes (Signed)
Pt amublated 100 ft with RW on RA without rest.  Complains of fatigue and SOB.  SPO2 after walk 94% on RA.  Pt to recliner with call bell in reach.  Will con't plan of care.

## 2013-06-19 NOTE — Progress Notes (Addendum)
[address]       [place] [address]             [phone]      3 Days Post-Op  Procedure(s) (LRB): TRANSCATHETER AORTIC VALVE REPLACEMENT, TRANSFEMORAL (N/A) INTRAOPERATIVE TRANSESOPHAGEAL ECHOCARDIOGRAM (N/A) Subjective: Overall feeling better, still fairly weak  Objective  Telemetry sinus rhythm  Temp:  [97.2 F (36.2 C)-98 F (36.7 C)] 98 F (36.7 C) (04/17 0610) Pulse Rate:  [45-73] 62 (04/17 0610) Resp:  [19-25] 20 (04/17 0610) BP: (123-149)/(50-73) 138/73 mmHg (04/17 0610) SpO2:  [89 %-100 %] 94 % (04/17 0610) Weight:  [244 lb 11.4 oz (111 kg)] 244 lb 11.4 oz (111 kg) (04/17 0418)   Intake/Output Summary (Last 24 hours) at 06/19/13 0734 Last data filed at 06/19/13 0555  Gross per 24 hour  Intake    224 ml  Output   2710 ml  Net  -2486 ml       General appearance: alert, cooperative and no distress Heart: regular rate and rhythm, no rub and no murmur Lungs: slightly dim in right>left base Abdomen: soft, nontender Extremities: venous stasis dermatitis noted Wound: healing well  Lab Results:  Recent Labs  06/17/13 0405 06/17/13 1700 06/18/13 0430 06/19/13 0426  NA 140  --  139 137  K 4.2  --  4.5 5.5*  CL 103  --  103 99  CO2 23  --  23 24  GLUCOSE 79  --  214* 259*  BUN 29*  --  27* 34*  CREATININE 1.66* 1.53* 1.48* 1.70*  CALCIUM 8.9  --  8.8 9.4  MG 2.5 2.5  --   --    No results found for this basename: AST, ALT, ALKPHOS, BILITOT, PROT, ALBUMIN,  in the last 72 hours No results found for this basename: LIPASE, AMYLASE,  in the last 72 hours  Recent Labs  06/18/13 0430 06/19/13 0426  WBC 7.9 7.3  HGB 10.0* 10.3*  HCT 31.6* 34.1*  MCV 84.9 86.8  PLT 193 190   No results found for this basename: CKTOTAL, CKMB, TROPONINI,  in the last 72 hours No components found with this basename: POCBNP,  No results found for this basename: DDIMER,  in the last 72 hours No results found for this basename: HGBA1C,  in the  last 72 hours No results found for this basename: CHOL, HDL, LDLCALC, TRIG, CHOLHDL,  in the last 72 hours No results found for this basename: TSH, T4TOTAL, FREET3, T3FREE, THYROIDAB,  in the last 72 hours No results found for this basename: VITAMINB12, FOLATE, FERRITIN, TIBC, IRON, RETICCTPCT,  in the last 72 hours  Medications: Scheduled . acetaminophen  1,000 mg Oral 4 times per day  . aspirin EC  81 mg Oral Daily  . atorvastatin  20 mg Oral q1800  . Chlorhexidine Gluconate Cloth  6 each Topical Daily  . clopidogrel  75 mg Oral Q breakfast  . ezetimibe  10 mg Oral QPM  . famotidine  20 mg Oral Daily  . furosemide  80 mg Intravenous Q12H  . insulin aspart  0-15 Units Subcutaneous TID WC  . insulin detemir  40 Units Subcutaneous Daily  . linagliptin  5 mg Oral Daily  . pantoprazole  40 mg Oral q morning - 10a  . sertraline  50 mg Oral QHS     Radiology/Studies:  No results found.  INR: Will add last result for INR, ABG once components are confirmed Will add last  4 CBG results once components are confirmed  Assessment/Plan: S/P Procedure(s) (LRB): TRANSCATHETER AORTIC VALVE REPLACEMENT, TRANSFEMORAL (N/A) INTRAOPERATIVE TRANSESOPHAGEAL ECHOCARDIOGRAM (N/A) 1 doing well- push rehab, wean O2 as able 2 H/H improved- on ASA/Plavix, platelets normal 3 hyperkalemia- follow 4 Creat increased- may need to reduce lasix- bmet in am 5 sugars elevated- holding glucophage, may need increased levemir, defer to primary service    LOS: 3 days    [name] [name] 4/17/20157:34 AM  I have seen and examined the patient and agree with the assessment and plan as outlined.  Agree w/ IV diuretic therapy again today, and he may need more.  I would like to see [name] [name]'s weight get down closer to baseline prior to d/c.   [name] [name] 06/19/2013 7:32 PM

## 2013-06-20 DIAGNOSIS — L02419 Cutaneous abscess of limb, unspecified: Secondary | ICD-10-CM

## 2013-06-20 DIAGNOSIS — E785 Hyperlipidemia, unspecified: Secondary | ICD-10-CM

## 2013-06-20 DIAGNOSIS — L03119 Cellulitis of unspecified part of limb: Secondary | ICD-10-CM

## 2013-06-20 DIAGNOSIS — I872 Venous insufficiency (chronic) (peripheral): Secondary | ICD-10-CM

## 2013-06-20 DIAGNOSIS — I099 Rheumatic heart disease, unspecified: Secondary | ICD-10-CM

## 2013-06-20 DIAGNOSIS — R609 Edema, unspecified: Secondary | ICD-10-CM

## 2013-06-20 DIAGNOSIS — Z954 Presence of other heart-valve replacement: Secondary | ICD-10-CM

## 2013-06-20 DIAGNOSIS — I25810 Atherosclerosis of coronary artery bypass graft(s) without angina pectoris: Secondary | ICD-10-CM

## 2013-06-20 DIAGNOSIS — E669 Obesity, unspecified: Secondary | ICD-10-CM

## 2013-06-20 DIAGNOSIS — E1149 Type 2 diabetes mellitus with other diabetic neurological complication: Secondary | ICD-10-CM

## 2013-06-20 DIAGNOSIS — E875 Hyperkalemia: Secondary | ICD-10-CM

## 2013-06-20 DIAGNOSIS — I10 Essential (primary) hypertension: Secondary | ICD-10-CM

## 2013-06-20 DIAGNOSIS — Z951 Presence of aortocoronary bypass graft: Secondary | ICD-10-CM

## 2013-06-20 DIAGNOSIS — I2789 Other specified pulmonary heart diseases: Secondary | ICD-10-CM

## 2013-06-20 DIAGNOSIS — I2510 Atherosclerotic heart disease of native coronary artery without angina pectoris: Secondary | ICD-10-CM

## 2013-06-20 DIAGNOSIS — G4730 Sleep apnea, unspecified: Secondary | ICD-10-CM

## 2013-06-20 DIAGNOSIS — I050 Rheumatic mitral stenosis: Secondary | ICD-10-CM

## 2013-06-20 DIAGNOSIS — R0989 Other specified symptoms and signs involving the circulatory and respiratory systems: Secondary | ICD-10-CM

## 2013-06-20 LAB — BASIC METABOLIC PANEL
BUN: 31 mg/dL — AB (ref 6–23)
CO2: 24 mEq/L (ref 19–32)
Calcium: 9.2 mg/dL (ref 8.4–10.5)
Chloride: 98 mEq/L (ref 96–112)
Creatinine, Ser: 1.41 mg/dL — ABNORMAL HIGH (ref 0.50–1.35)
GFR calc Af Amer: 57 mL/min — ABNORMAL LOW (ref >90)
GFR, EST NON AFRICAN AMERICAN: 49 mL/min — AB (ref >90)
Glucose, Bld: 232 mg/dL — ABNORMAL HIGH (ref 70–99)
Potassium: 4.6 mEq/L (ref 3.7–5.3)
SODIUM: 138 meq/L (ref 137–147)

## 2013-06-20 LAB — GLUCOSE, CAPILLARY
GLUCOSE-CAPILLARY: 258 mg/dL — AB (ref 70–99)
Glucose-Capillary: 229 mg/dL — ABNORMAL HIGH (ref 70–99)
Glucose-Capillary: 375 mg/dL — ABNORMAL HIGH (ref 70–99)
Glucose-Capillary: 292 mg/dL — ABNORMAL HIGH (ref 70–99)

## 2013-06-20 MED ORDER — METOLAZONE 5 MG PO TABS
5.0000 mg | ORAL_TABLET | Freq: Once | ORAL | Status: DC
  Filled 2013-06-20: qty 1

## 2013-06-20 MED ORDER — METOLAZONE 2.5 MG PO TABS
2.5000 mg | ORAL_TABLET | Freq: Once | ORAL | Status: AC
  Administered 2013-06-20: 2.5 mg via ORAL
  Filled 2013-06-20: qty 1

## 2013-06-20 MED ORDER — METOPROLOL TARTRATE 12.5 MG HALF TABLET
12.5000 mg | ORAL_TABLET | Freq: Two times a day (BID) | ORAL | Status: DC
  Filled 2013-06-20 (×2): qty 1

## 2013-06-20 NOTE — Progress Notes (Addendum)
[address]       [place] [address]             [phone]      4 Days Post-Op  Procedure(s) (LRB): TRANSCATHETER AORTIC VALVE REPLACEMENT, TRANSFEMORAL (N/A) INTRAOPERATIVE TRANSESOPHAGEAL ECHOCARDIOGRAM (N/A) Subjective: Looks and feels better/stronger. Off O2. + BM  Objective  Telemetry sinus  Temp:  [98.3 F (36.8 C)-99.2 F (37.3 C)] 99.2 F (37.3 C) (04/18 0523) Pulse Rate:  [58-75] 58 (04/18 0523) Resp:  [18-20] 20 (04/18 0523) BP: (132-157)/(52-73) 133/73 mmHg (04/18 0523) SpO2:  [93 %-94 %] 93 % (04/18 0523) Weight:  [244 lb 4.8 oz (110.814 kg)] 244 lb 4.8 oz (110.814 kg) (04/18 0523)   Intake/Output Summary (Last 24 hours) at 06/20/13 0727 Last data filed at 06/20/13 0529  Gross per 24 hour  Intake    720 ml  Output   1926 ml  Net  -1206 ml       General appearance: alert, cooperative and no distress Heart: regular rate and rhythm Lungs: clear to auscultation bilaterally Abdomen: benign Extremities: chronic venous stasis Wound: incis healing well  Lab Results:  Recent Labs  06/17/13 1700  06/19/13 0426 06/20/13 0505  NA  --   < > 137 138  K  --   < > 5.5* 4.6  CL  --   < > 99 98  CO2  --   < > 24 24  GLUCOSE  --   < > 259* 232*  BUN  --   < > 34* 31*  CREATININE 1.53*  < > 1.70* 1.41*  CALCIUM  --   < > 9.4 9.2  MG 2.5  --   --   --   < > = values in this interval not displayed. No results found for this basename: AST, ALT, ALKPHOS, BILITOT, PROT, ALBUMIN,  in the last 72 hours No results found for this basename: LIPASE, AMYLASE,  in the last 72 hours  Recent Labs  06/18/13 0430 06/19/13 0426  WBC 7.9 7.3  HGB 10.0* 10.3*  HCT 31.6* 34.1*  MCV 84.9 86.8  PLT 193 190   No results found for this basename: CKTOTAL, CKMB, TROPONINI,  in the last 72 hours No components found with this basename: POCBNP,  No results found for this basename: DDIMER,  in the last 72 hours No results found for this basename:  HGBA1C,  in the last 72 hours No results found for this basename: CHOL, HDL, LDLCALC, TRIG, CHOLHDL,  in the last 72 hours No results found for this basename: TSH, T4TOTAL, FREET3, T3FREE, THYROIDAB,  in the last 72 hours No results found for this basename: VITAMINB12, FOLATE, FERRITIN, TIBC, IRON, RETICCTPCT,  in the last 72 hours  Medications: Scheduled . acetaminophen  1,000 mg Oral 4 times per day  . aspirin EC  81 mg Oral Daily  . atorvastatin  20 mg Oral q1800  . Chlorhexidine Gluconate Cloth  6 each Topical Daily  . clopidogrel  75 mg Oral Q breakfast  . ezetimibe  10 mg Oral QPM  . famotidine  20 mg Oral Daily  . furosemide  80 mg Intravenous Q12H  . insulin aspart  0-15 Units Subcutaneous TID WC  . insulin detemir  40 Units Subcutaneous Daily  . linagliptin  5 mg Oral Daily  . pantoprazole  40 mg Oral q morning - 10a  . sertraline  50 mg Oral QHS     Radiology/Studies:  No results found.  INR: Will add last result for INR, ABG once components are confirmed Will add last 4 CBG results once components are confirmed  Assessment/Plan: S/P Procedure(s) (LRB): TRANSCATHETER AORTIC VALVE REPLACEMENT, TRANSFEMORAL (N/A) INTRAOPERATIVE TRANSESOPHAGEAL ECHOCARDIOGRAM (N/A)  1 doing well 2 creat improved, almost 2 liter diuresed yesterday 3 sugars not well controlled, eating well, prob ready to transition to home routine meds 4 poss d/c - per cardiology   LOS: 4 days    John Giovanni 4/18/20157:27 AM  I have seen and examined the patient and agree with the assessment and plan as outlined.  I favor at least one more day in the hospital for IV diuretic therapy and further adjustment to diabetes management.  Rexene Alberts 06/20/2013 11:05 AM

## 2013-06-20 NOTE — Progress Notes (Signed)
Patient ambulated approximately 40 ft with wheelchair and RN and tolerated fairly well.  Pt did not stop for rest and HR remained stable.  Feeling good after the walk.  Left in chair with wife at bedside and call bell within reach.  Will continue to monitor.  Randell Patient

## 2013-06-20 NOTE — Progress Notes (Addendum)
SUBJECTIVE: Pt denies chest pain and shortness of breath. Feels leg and foot swelling is "about the same as yesterday". Blood sugars remain elevated.      Intake/Output Summary (Last 24 hours) at 06/20/13 1105 Last data filed at 06/20/13 0730  Gross per 24 hour  Intake    720 ml  Output   1526 ml  Net   -806 ml    Current Facility-Administered Medications  Medication Dose Route Frequency Provider Last Rate Last Dose  . 0.9 %  sodium chloride infusion  250 mL Intravenous PRN [name], MD      . acetaminophen (TYLENOL) tablet 1,000 mg  1,000 mg Oral 4 times per day [name], MD   1,000 mg at 06/20/13 0646  . ALPRAZolam Duanne Moron) tablet 0.25 mg  0.25 mg Oral BID PRN [name], MD   0.25 mg at 06/17/13 2301  . aspirin EC tablet 81 mg  81 mg Oral Daily [name], MD   81 mg at 06/20/13 1041  . atorvastatin (LIPITOR) tablet 20 mg  20 mg Oral q1800 [name], MD   20 mg at 06/19/13 1743  . bisacodyl (DULCOLAX) suppository 10 mg  10 mg Rectal Daily PRN Gaye Pollack, MD   10 mg at 06/18/13 0620  . Chlorhexidine Gluconate Cloth 2 % PADS 6 each  6 each Topical Daily [name], MD   6 each at 06/19/13 1000  . clopidogrel (PLAVIX) tablet 75 mg  75 mg Oral Q breakfast [name], MD   75 mg at 06/20/13 9371  . ezetimibe (ZETIA) tablet 10 mg  10 mg Oral QPM [name], MD   10 mg at 06/19/13 1743  . famotidine (PEPCID) tablet 20 mg  20 mg Oral Daily [name], MD   20 mg at 06/20/13 1041  . furosemide (LASIX) injection 80 mg  80 mg Intravenous Q12H [name], MD   80 mg at 06/20/13 6967  . insulin aspart (novoLOG) injection 0-15 Units  0-15 Units Subcutaneous TID WC Rexene Alberts, MD   5 Units at 06/20/13 [phone]  . insulin detemir (LEVEMIR) injection 40 Units  40 Units Subcutaneous Daily [name], MD   40 Units at 06/20/13 1042  . linagliptin (TRADJENTA) tablet 5 mg  5 mg Oral Daily [name], MD   5 mg at 06/20/13 1041  .  methocarbamol (ROBAXIN) tablet 500 mg  500 mg Oral Daily PRN [name], MD      . metoprolol (LOPRESSOR) injection 2.5-5 mg  2.5-5 mg Intravenous Q2H PRN [name], MD      . morphine 2 MG/ML injection 2 mg  2 mg Intravenous Q1H PRN Rexene Alberts, MD      . nitroGLYCERIN (NITROSTAT) SL tablet 0.4 mg  0.4 mg Sublingual Q5 min PRN [name], MD      . ondansetron [facility]) injection 4 mg  4 mg Intravenous Q6H PRN [name], MD   4 mg at 06/16/13 1845  . oxyCODONE (Oxy IR/ROXICODONE) immediate release tablet 5-10 mg  5-10 mg Oral Q3H PRN [name], MD   5 mg at 06/19/13 2127  . pantoprazole (PROTONIX) EC tablet 40 mg  40 mg Oral q morning - 10a [name], MD   40 mg at 06/20/13 1041  . sertraline (ZOLOFT) tablet 50 mg  50 mg Oral QHS [name], MD   50 mg at 06/19/13 2125    Filed Vitals:   06/19/13 0610 06/19/13  1319 06/19/13 2034 06/20/13 0523  BP: 138/73 157/52 132/53 133/73  Pulse: 62 65 75 58  Temp: 98 F (36.7 C) 98.5 F (36.9 C) 98.3 F (36.8 C) 99.2 F (37.3 C)  TempSrc: Oral Oral Oral Oral  Resp: 20 19 18 20   Height:      Weight:    244 lb 4.8 oz (110.814 kg)  SpO2: 94% 94% 93% 93%    PHYSICAL EXAM General: NAD, obese Neck: No JVD, no thyromegaly.  Lungs: Diminished at bases but otherwise clear to auscultation bilaterally with normal respiratory effort. CV: Nondisplaced PMI.  Regular rate and rhythm, normal S1/S2, no S3/S4, soft I/VI systolic murmur along left sternal border and RUSB.  1-2+ pitting pretibial and dorsal foot edema b/l. Chronic venous stasis dermatitis b/l.  No carotid bruit.   Abdomen: Soft, nontender, no hepatosplenomegaly, no distention.  Neurologic: Alert and oriented x 3.  Psych: Normal affect. Extremities: No clubbing or cyanosis.   TELEMETRY: Reviewed telemetry pt in sinus rhythm with sinus arrhythmia and PAC's.  LABS: Basic Metabolic Panel:  Recent Labs  06/17/13 1700  06/19/13 0426 06/20/13 0505  NA  --    < > 137 138  K  --   < > 5.5* 4.6  CL  --   < > 99 98  CO2  --   < > 24 24  GLUCOSE  --   < > 259* 232*  BUN  --   < > 34* 31*  CREATININE 1.53*  < > 1.70* 1.41*  CALCIUM  --   < > 9.4 9.2  MG 2.5  --   --   --   < > = values in this interval not displayed. Liver Function Tests: No results found for this basename: AST, ALT, ALKPHOS, BILITOT, PROT, ALBUMIN,  in the last 72 hours No results found for this basename: LIPASE, AMYLASE,  in the last 72 hours CBC:  Recent Labs  06/18/13 0430 06/19/13 0426  WBC 7.9 7.3  HGB 10.0* 10.3*  HCT 31.6* 34.1*  MCV 84.9 86.8  PLT 193 190   Cardiac Enzymes: No results found for this basename: CKTOTAL, CKMB, CKMBINDEX, TROPONINI,  in the last 72 hours BNP: No components found with this basename: POCBNP,  D-Dimer: No results found for this basename: DDIMER,  in the last 72 hours Hemoglobin A1C: No results found for this basename: HGBA1C,  in the last 72 hours Fasting Lipid Panel: No results found for this basename: CHOL, HDL, LDLCALC, TRIG, CHOLHDL, LDLDIRECT,  in the last 72 hours Thyroid Function Tests: No results found for this basename: TSH, T4TOTAL, FREET3, T3FREE, THYROIDAB,  in the last 72 hours Anemia Panel: No results found for this basename: VITAMINB12, FOLATE, FERRITIN, TIBC, IRON, RETICCTPCT,  in the last 72 hours  RADIOLOGY: Dg Chest 2 View  06/12/2013   CLINICAL DATA:  Preoperative examination, transcatheter aortic valve replacement  EXAM: CHEST  2 VIEW  COMPARISON:  DG CHEST 2 VIEW dated 03/30/2013; DG CHEST 1V PORT dated 03/20/2013; CT HEART MORP W/CTA COR W/SCORE W/CA W/CM &/OR WO/CM dated 06/04/2012  FINDINGS: Grossly unchanged borderline enlarged cardiac silhouette and mediastinal contours post median sternotomy. Calcifications within the mitral valve annulus. There is grossly unchanged prominence of the central pulmonary vasculature. Mild pulmonary congestion without evidence of edema. No focal airspace opacities. No pleural  effusion or pneumothorax. There is mild elevation of the right hemidiaphragm. Stigmata of DISH within the thoracic spine.  IMPRESSION: Similar findings of cardiomegaly and pulmonary venous  congestion without acute cardiopulmonary disease.   Electronically Signed   By: Sandi Mariscal M.D.   On: 06/12/2013 11:13   Dg Chest Portable 1 View In Am  06/17/2013   CLINICAL DATA:  Aortic valve disease  EXAM: PORTABLE CHEST - 1 VIEW  COMPARISON:  June 16, 2013  FINDINGS: There is patchy opacity in the left lower lobe, primarily representing atelectatic change. Lungs elsewhere clear. Heart is enlarged with pulmonary vascularity within normal limits. No adenopathy. Endotracheal tube and nasogastric tube have been removed. Swan-Ganz catheter tip is in the main pulmonary outflow tract. Patient is status post aortic valve replacement.  IMPRESSION: Atelectasis with possible patchy infiltrate left lower lobe. Lungs elsewhere clear. No pneumothorax. Cardiomegaly stable. Central catheter tip in main pulmonary outflow tract. Endotracheal tube and nasogastric tube removed.   Electronically Signed   By: Lowella Grip M.D.   On: 06/17/2013 07:32   Dg Chest Portable 1 View  06/16/2013   CLINICAL DATA:  Postop cardiac surgery.  EXAM: PORTABLE CHEST - 1 VIEW  COMPARISON:  06/12/2013  FINDINGS: Since prior study, cardiac surgery has been performed. There There is a new prosthetic aortic valve. Cardiac silhouette is mildly enlarged. No mediastinal widening.  An endotracheal tube has its tip 4.1 cm above the chronic. The right internal jugular Swan-Ganz catheter has its tip ankle towards the right pulmonary artery. Nasogastric tube passes below the diaphragm into the stomach.  There is central vascular congestion with central interstitial thickening. Left lung base opacity is noted likely combination of a small effusion and atelectasis. No gross pneumothorax on this supine study.  IMPRESSION: 1. Status post cardiac surgery. 2. Mild  central pulmonary edema. 3. No mediastinal widening.  No gross pneumothorax. 4. Support apparatus is well positioned.   Electronically Signed   By: Lajean Manes M.D.   On: 06/16/2013 11:45      ASSESSMENT AND PLAN: 1. Severe aortic stenosis POD#4 from transfemoral TAVR  2. Acute on chronic diastolic heart failure  3. CKD, stage 3, stable  4. Severe pulmonary HTN  5. Type 2 DM - CBG's 232-259     Appears stable. However, given his b/l leg and foot edema and elevated blood sugars, I feel he would benefit from another 24 hours in the hospital to work with cardiac rehab, diurese further, and improve glycemic control.  Plan:  Continue ASA/plavix  IV lasix again today, change back to oral lasix in am. Will given a dose of metolazone to potentiate diuresis. Has had 1206 cc of output in last 24 hours, and nearly 1.5 liters this morning alone. Repeat BMET in am. K stable at 4.6 today. Will hold off on restarting metoprolol 12.5 mg BID given hypotension with ambulation with cardiac rehab. Continue Januvia and Levemir. Doses may need adjustment next week given cessation of Metformin due to heart failure and CKD. Anticipate discharge tomorrow  Will arrange 30 day follow-up, but he should have a 1 week transition-of-care visit for close CHF follow-up and BMET.   Kate Sable, M.D., F.A.C.C.

## 2013-06-20 NOTE — Progress Notes (Signed)
CARDIAC REHAB PHASE I   PRE:  Rate/Rhythm: 59 SB  BP:  Supine:   Sitting: 123/60  Standing:    SaO2: 96% RA  MODE:  Ambulation: 150 ft   POST:  Rate/Rhythm: 80 SR  BP:  Supine:   Sitting: 86/47 rck 110/54 Standing:    SaO2: 96% RA  [id number] Patient tolerated ambulation well with assist x2 and pushing walker, mild DOE otherwise no c/o. To chair after walk with legs elevated. BP after walk was 86/47 pt asymptomatic, recheck BP was 110/54. Reviewed d/c education with patient and patient's wife including IS use, restrictions, risk factor modification, and activity progression. Pt verbalizes understanding of instructions given. Discussed Phase 2 cardiac rehab and permission given to send contact information to CR at [facility].   Sol Passer, MS, ACSM CCEP

## 2013-06-21 DIAGNOSIS — L98499 Non-pressure chronic ulcer of skin of other sites with unspecified severity: Secondary | ICD-10-CM

## 2013-06-21 DIAGNOSIS — I739 Peripheral vascular disease, unspecified: Secondary | ICD-10-CM

## 2013-06-21 LAB — GLUCOSE, CAPILLARY
GLUCOSE-CAPILLARY: 271 mg/dL — AB (ref 70–99)
Glucose-Capillary: 229 mg/dL — ABNORMAL HIGH (ref 70–99)

## 2013-06-21 LAB — BASIC METABOLIC PANEL
BUN: 34 mg/dL — AB (ref 6–23)
CO2: 27 mEq/L (ref 19–32)
CREATININE: 1.35 mg/dL (ref 0.50–1.35)
Calcium: 10 mg/dL (ref 8.4–10.5)
Chloride: 96 mEq/L (ref 96–112)
GFR calc Af Amer: 60 mL/min — ABNORMAL LOW (ref >90)
GFR, EST NON AFRICAN AMERICAN: 52 mL/min — AB (ref >90)
Glucose, Bld: 238 mg/dL — ABNORMAL HIGH (ref 70–99)
Potassium: 3.7 mEq/L (ref 3.7–5.3)
Sodium: 137 mEq/L (ref 137–147)

## 2013-06-21 MED ORDER — CLOPIDOGREL BISULFATE 75 MG PO TABS: 75.0000 mg | ORAL_TABLET | Freq: Every day | ORAL | Status: DC

## 2013-06-21 MED ORDER — FUROSEMIDE 80 MG PO TABS: 80.0000 mg | ORAL_TABLET | Freq: Two times a day (BID) | ORAL | Status: DC

## 2013-06-21 MED ORDER — METOLAZONE 2.5 MG PO TABS
2.5000 mg | ORAL_TABLET | Freq: Once | ORAL | Status: DC
  Filled 2013-06-21: qty 1

## 2013-06-21 MED ORDER — METOLAZONE 2.5 MG PO TABS: 2.5000 mg | ORAL_TABLET | Freq: Once | ORAL | Status: DC

## 2013-06-21 MED ORDER — INSULIN DETEMIR 100 UNIT/ML ~~LOC~~ SOLN: 40.0000 [IU] | Freq: Every day | SUBCUTANEOUS | Status: DC

## 2013-06-21 MED ORDER — METOLAZONE 2.5 MG PO TABS: 2.5000 mg | ORAL_TABLET | ORAL | Status: DC | PRN

## 2013-06-21 NOTE — Progress Notes (Addendum)
[address]       [place] [address]             [phone]      5 Days Post-Op  Procedure(s) (LRB): TRANSCATHETER AORTIC VALVE REPLACEMENT, TRANSFEMORAL (N/A) INTRAOPERATIVE TRANSESOPHAGEAL ECHOCARDIOGRAM (N/A) Subjective: conts to look and feel better  Objective  Telemetry sinus with PVC's  Temp:  [97.5 F (36.4 C)-98.2 F (36.8 C)] 98.2 F (36.8 C) (04/19 0620) Pulse Rate:  [59-62] 59 (04/19 0620) Resp:  [18-20] 18 (04/19 0620) BP: (121-156)/(53-64) 130/53 mmHg (04/19 0620) SpO2:  [92 %-97 %] 94 % (04/19 0620) Weight:  [236 lb 1.8 oz (107.1 kg)] 236 lb 1.8 oz (107.1 kg) (04/19 0620)   Intake/Output Summary (Last 24 hours) at 06/21/13 1202 Last data filed at 06/21/13 0813  Gross per 24 hour  Intake    720 ml  Output   2150 ml  Net  -1430 ml       General appearance: alert, cooperative and no distress Heart: regular rate and rhythm and no murmur Lungs: mildly dim in basesw Extremities: + edema, chronic venous stasis Wound: healing well  Lab Results:  Recent Labs  06/20/13 0505 06/21/13 0621  NA 138 137  K 4.6 3.7  CL 98 96  CO2 24 27  GLUCOSE 232* 238*  BUN 31* 34*  CREATININE 1.41* 1.35  CALCIUM 9.2 10.0   No results found for this basename: AST, ALT, ALKPHOS, BILITOT, PROT, ALBUMIN,  in the last 72 hours No results found for this basename: LIPASE, AMYLASE,  in the last 72 hours  Recent Labs  06/19/13 0426  WBC 7.3  HGB 10.3*  HCT 34.1*  MCV 86.8  PLT 190   No results found for this basename: CKTOTAL, CKMB, TROPONINI,  in the last 72 hours No components found with this basename: POCBNP,  No results found for this basename: DDIMER,  in the last 72 hours No results found for this basename: HGBA1C,  in the last 72 hours No results found for this basename: CHOL, HDL, LDLCALC, TRIG, CHOLHDL,  in the last 72 hours No results found for this basename: TSH, T4TOTAL, FREET3, T3FREE, THYROIDAB,  in the last 72 hours No  results found for this basename: VITAMINB12, FOLATE, FERRITIN, TIBC, IRON, RETICCTPCT,  in the last 72 hours  Medications: Scheduled . acetaminophen  1,000 mg Oral 4 times per day  . aspirin EC  81 mg Oral Daily  . atorvastatin  20 mg Oral q1800  . clopidogrel  75 mg Oral Q breakfast  . ezetimibe  10 mg Oral QPM  . famotidine  20 mg Oral Daily  . furosemide  80 mg Intravenous Q12H  . insulin aspart  0-15 Units Subcutaneous TID WC  . insulin detemir  40 Units Subcutaneous Daily  . linagliptin  5 mg Oral Daily  . pantoprazole  40 mg Oral q morning - 10a  . sertraline  50 mg Oral QHS     Radiology/Studies:  No results found.  INR: Will add last result for INR, ABG once components are confirmed Will add last 4 CBG results once components are confirmed  Assessment/Plan: S/P Procedure(s) (LRB): TRANSCATHETER AORTIC VALVE REPLACEMENT, TRANSFEMORAL (N/A) INTRAOPERATIVE TRANSESOPHAGEAL ECHOCARDIOGRAM (N/A)   1 conts with steady progress 2 sugars remain elevated 3 good diuresis 4 cardiology plans to discharge   LOS: 5 days    John Giovanni 4/19/201512:02 PM  I have seen and examined the patient and agree with  the assessment and plan as outlined.  Rexene Alberts 06/21/2013 12:47 PM

## 2013-06-21 NOTE — Progress Notes (Signed)
Pt discharged per Md order and protocol. Discharge instructions reviewed with patient and wife, all questions answered. Pt aware medications have been called into his pharmacy and will need to be picked up.

## 2013-06-21 NOTE — Discharge Summary (Signed)
Physician Discharge Summary  Patient ID: [name] [name]. MRN: [id number] DOB/AGE: 05-24-1940 73 y.o.  Admit date: 06/16/2013 Discharge date: 06/21/2013  Admission Diagnoses: Severe Aortic Stenosis  Discharge Diagnoses:  Principal Problem:   S/P TAVR (transcatheter aortic valve replacement) Active Problems:   Aortic stenosis, severe   Discharged Condition: stable  Hospital Course: Mr. Berkovich is a 73 y/o male with h/o severe aortic stenosis, who [facility] to undergo planned transfemoral TAVR. This was performed by Dr. [name] and Dr. Roxy Manns. He tolerated the procedure well, but required low dose dopamine for bradycardia and low CO. His Metoprolol was held. He also developed acute on chronic CHF and required IV Lasix for diuresis. He was eventually weaned off of dopamine as HR and CO improved. He had good diuresis with lasix. He was also noted to have soft blood pressures but remained asymptomatic. He had no other post surgical issues.    On hospital day 5, he was seen and examined by Dr. Bronson Ing, who determined he was stable for discharge home. Several medication changes were made on discharge. It was decided to hold off on restarting metoprolol due to BP. Given his HF and CKD, his Metformin was discontinued. He was placed on Januvia and Levemir. He was instructed to continue BID Lasix. Metolazone was added to potentate diuresis. Dr. Bronson Ing ordered for him to take 2.5 mg on day 1 and 2, post discharge. A decision will need to be made at time of post hospital visit to determine if this should be continued. He was given an order to obtain a BMP in 1 week. He will f/u with an extender in 1 week and will need 30 day follow-up with Dr. [name].    Consults: None  Significant Diagnostic Studies:   Treatments:See Hospital Course  Discharge Exam: Blood pressure 127/60, pulse 58, temperature 97.4 F (36.3 C), temperature source Axillary, resp. rate 18, height 5\' 10"  (1.778  m), weight 236 lb 1.8 oz (107.1 kg), SpO2 98.00%.   Disposition: 01-Home or Self Care      Discharge Orders   Future Appointments Provider Department Dept Phone   07/06/2013 2:00 PM Rexene Alberts, MD [facility] [phone]   Future Orders Complete By Expires   Amb Referral to Cardiac Rehabilitation  As directed        Medication List    STOP taking these medications       fenofibrate micronized 130 MG capsule  Commonly known as:  ANTARA     LANTUS 100 UNIT/ML injection  Generic drug:  insulin glargine     metFORMIN 1000 MG tablet  Commonly known as:  GLUCOPHAGE     metoprolol tartrate 12.5 mg Tabs tablet  Commonly known as:  LOPRESSOR     omega-3 acid ethyl esters 1 G capsule  Commonly known as:  LOVAZA     repaglinide 2 MG tablet  Commonly known as:  PRANDIN      TAKE these medications       ALPRAZolam 0.25 MG tablet  Commonly known as:  XANAX  Take 1 tablet (0.25 mg total) by mouth 2 (two) times daily as needed for anxiety.     ANTI MONKEY BUTT Powd  Apply 1 application topically as needed (burning itching drying up). Apply to legs and rub in     aspirin EC 81 MG tablet  Take 81 mg by mouth daily.     clopidogrel 75 MG tablet  Commonly known  as:  PLAVIX  Take 1 tablet (75 mg total) by mouth daily with breakfast.     Coenzyme Q10 300 MG Caps  Take 300 mg by mouth daily.     ezetimibe 10 MG tablet  Commonly known as:  ZETIA  Take 1 tablet (10 mg total) by mouth every evening.     fish oil-omega-3 fatty acids 1000 MG capsule  Take 1 g by mouth 2 (two) times daily.     furosemide 80 MG tablet  Commonly known as:  LASIX  Take 1 tablet (80 mg total) by mouth 2 (two) times daily.     insulin detemir 100 UNIT/ML injection  Commonly known as:  LEVEMIR  Inject 0.4 mLs (40 Units total) into the skin daily.     JANUVIA 100 MG tablet  Generic drug:  sitaGLIPtin  Take 100 mg by mouth at bedtime.      methocarbamol 500 MG tablet  Commonly known as:  ROBAXIN  Take 500 mg by mouth daily as needed (leg cramps).     metolazone 2.5 MG tablet  Commonly known as:  ZAROXOLYN  Take 1 tablet (2.5 mg total) by mouth as needed (take on 4/20 and 4/21).     nitroGLYCERIN 0.4 MG SL tablet  Commonly known as:  NITROSTAT  Place 1 tablet (0.4 mg total) under the tongue every 5 (five) minutes as needed for chest pain. May repeat up to 3 times as needed     pantoprazole 40 MG tablet  Commonly known as:  PROTONIX  Take 40 mg by mouth every morning.     potassium chloride SA 20 MEQ tablet  Commonly known as:  K-DUR,KLOR-CON  Take 20-40 mEq by mouth 2 (two) times daily. 2 tabs in the morning and 1 tab in the evening     ranitidine 150 MG tablet  Commonly known as:  ZANTAC  Take 150 mg by mouth 2 (two) times daily.     rosuvastatin 20 MG tablet  Commonly known as:  CRESTOR  Take 1 tablet (20 mg total) by mouth at bedtime.     sertraline 50 MG tablet  Commonly known as:  ZOLOFT  TAKE 1 TABLET BY MOUTH DAILY     Vitamin D (Ergocalciferol) 50000 UNITS Caps capsule  Commonly known as:  DRISDOL  Take 50,000 Units by mouth every 7 (seven) days. Take every week on saturday       Follow-up Information   Follow up with [name], MD. (our office will call you with a follow-up appointment)    Specialty:  Cardiology   Contact information:   [address] [place] [address] [phone]      TIME SPENT ON DISCHARGE, INCLUDING PHYSICIAN TIME: > 30 MINUTES.  Signed: Lyda Jester 06/21/2013, 5:32 PM

## 2013-06-21 NOTE — Progress Notes (Signed)
Pt ambulated in hallway 300 ft and tolerated activity fair. Pt became pale while walking but denies any dizziness. Pt back to room to chair. BP at that time was 81/42. Pt's feet elevated. Pt's bp rechecked and was 130/46. Pt still denies any dizziness. Will continue to monitor. PA paged, awaiting call back.

## 2013-06-21 NOTE — Progress Notes (Signed)
Pt ambulated in hallway 200 ft with wife on room air. Pt tolerated activity well. Will continue to monitor.

## 2013-06-21 NOTE — Progress Notes (Signed)
SUBJECTIVE: Pt denies chest pain and shortness of breath. He is anxious to go home. He continues to have leg swelling, which he says has improved since yesterday. Denies fevers/chills.     Intake/Output Summary (Last 24 hours) at 06/21/13 1206 Last data filed at 06/21/13 0813  Gross per 24 hour  Intake    720 ml  Output   2150 ml  Net  -1430 ml    Current Facility-Administered Medications  Medication Dose Route Frequency Provider Last Rate Last Dose  . 0.9 %  sodium chloride infusion  250 mL Intravenous PRN [name], MD      . acetaminophen (TYLENOL) tablet 1,000 mg  1,000 mg Oral 4 times per day [name], MD   1,000 mg at 06/21/13 1147  . ALPRAZolam Duanne Moron) tablet 0.25 mg  0.25 mg Oral BID PRN [name], MD   0.25 mg at 06/17/13 2301  . aspirin EC tablet 81 mg  81 mg Oral Daily [name], MD   81 mg at 06/21/13 1058  . atorvastatin (LIPITOR) tablet 20 mg  20 mg Oral q1800 [name], MD   20 mg at 06/20/13 1647  . bisacodyl (DULCOLAX) suppository 10 mg  10 mg Rectal Daily PRN Gaye Pollack, MD   10 mg at 06/18/13 0620  . clopidogrel (PLAVIX) tablet 75 mg  75 mg Oral Q breakfast [name], MD   75 mg at 06/21/13 0818  . ezetimibe (ZETIA) tablet 10 mg  10 mg Oral QPM [name], MD   10 mg at 06/20/13 1648  . famotidine (PEPCID) tablet 20 mg  20 mg Oral Daily [name], MD   20 mg at 06/21/13 1058  . furosemide (LASIX) injection 80 mg  80 mg Intravenous Q12H [name], MD   80 mg at 06/21/13 0818  . insulin aspart (novoLOG) injection 0-15 Units  0-15 Units Subcutaneous TID WC Rexene Alberts, MD   8 Units at 06/21/13 1147  . insulin detemir (LEVEMIR) injection 40 Units  40 Units Subcutaneous Daily [name], MD   40 Units at 06/21/13 1058  . linagliptin (TRADJENTA) tablet 5 mg  5 mg Oral Daily [name], MD   5 mg at 06/21/13 1058  . methocarbamol (ROBAXIN) tablet 500 mg  500 mg Oral Daily PRN [name], MD      .  metoprolol (LOPRESSOR) injection 2.5-5 mg  2.5-5 mg Intravenous Q2H PRN [name], MD      . morphine 2 MG/ML injection 2 mg  2 mg Intravenous Q1H PRN Rexene Alberts, MD      . nitroGLYCERIN (NITROSTAT) SL tablet 0.4 mg  0.4 mg Sublingual Q5 min PRN [name], MD      . ondansetron [facility]) injection 4 mg  4 mg Intravenous Q6H PRN [name], MD   4 mg at 06/16/13 1845  . oxyCODONE (Oxy IR/ROXICODONE) immediate release tablet 5-10 mg  5-10 mg Oral Q3H PRN [name], MD   5 mg at 06/21/13 0031  . pantoprazole (PROTONIX) EC tablet 40 mg  40 mg Oral q morning - 10a [name], MD   40 mg at 06/21/13 1058  . sertraline (ZOLOFT) tablet 50 mg  50 mg Oral QHS [name], MD   50 mg at 06/20/13 2112    Filed Vitals:   06/20/13 0523 06/20/13 1342 06/20/13 2034 06/21/13 0620  BP: 133/73 121/56 156/64 130/53  Pulse: 58 59 62 59  Temp: 99.2 F (  37.3 C) 97.5 F (36.4 C) 97.8 F (36.6 C) 98.2 F (36.8 C)  TempSrc: Oral Oral Oral Oral  Resp: 20 20 18 18   Height:      Weight: 244 lb 4.8 oz (110.814 kg)   236 lb 1.8 oz (107.1 kg)  SpO2: 93% 97% 92% 94%    PHYSICAL EXAM General: NAD, obese  Neck: No JVD, no thyromegaly.  Lungs: Diminished at bases but otherwise clear to auscultation bilaterally with normal respiratory effort.  CV: Nondisplaced PMI. Regular rate and rhythm, normal S1/S2, no S3/S4, soft I/VI systolic murmur along left sternal border and RUSB. 1-2+ pitting pretibial and dorsal foot edema b/l. Chronic venous stasis dermatitis b/l. No carotid bruit.  Abdomen: Soft, nontender, no hepatosplenomegaly, no distention.  Neurologic: Alert and oriented x 3.  Psych: Normal affect.  Extremities: No clubbing or cyanosis.     LABS: Basic Metabolic Panel:  Recent Labs  06/20/13 0505 06/21/13 0621  NA 138 137  K 4.6 3.7  CL 98 96  CO2 24 27  GLUCOSE 232* 238*  BUN 31* 34*  CREATININE 1.41* 1.35  CALCIUM 9.2 10.0   Liver Function Tests: No results found  for this basename: AST, ALT, ALKPHOS, BILITOT, PROT, ALBUMIN,  in the last 72 hours No results found for this basename: LIPASE, AMYLASE,  in the last 72 hours CBC:  Recent Labs  06/19/13 0426  WBC 7.3  HGB 10.3*  HCT 34.1*  MCV 86.8  PLT 190   Cardiac Enzymes: No results found for this basename: CKTOTAL, CKMB, CKMBINDEX, TROPONINI,  in the last 72 hours BNP: No components found with this basename: POCBNP,  D-Dimer: No results found for this basename: DDIMER,  in the last 72 hours Hemoglobin A1C: No results found for this basename: HGBA1C,  in the last 72 hours Fasting Lipid Panel: No results found for this basename: CHOL, HDL, LDLCALC, TRIG, CHOLHDL, LDLDIRECT,  in the last 72 hours Thyroid Function Tests: No results found for this basename: TSH, T4TOTAL, FREET3, T3FREE, THYROIDAB,  in the last 72 hours Anemia Panel: No results found for this basename: VITAMINB12, FOLATE, FERRITIN, TIBC, IRON, RETICCTPCT,  in the last 72 hours  RADIOLOGY: Dg Chest 2 View  06/12/2013   CLINICAL DATA:  Preoperative examination, transcatheter aortic valve replacement  EXAM: CHEST  2 VIEW  COMPARISON:  DG CHEST 2 VIEW dated 03/30/2013; DG CHEST 1V PORT dated 03/20/2013; CT HEART MORP W/CTA COR W/SCORE W/CA W/CM &/OR WO/CM dated 06/04/2012  FINDINGS: Grossly unchanged borderline enlarged cardiac silhouette and mediastinal contours post median sternotomy. Calcifications within the mitral valve annulus. There is grossly unchanged prominence of the central pulmonary vasculature. Mild pulmonary congestion without evidence of edema. No focal airspace opacities. No pleural effusion or pneumothorax. There is mild elevation of the right hemidiaphragm. Stigmata of DISH within the thoracic spine.  IMPRESSION: Similar findings of cardiomegaly and pulmonary venous congestion without acute cardiopulmonary disease.   Electronically Signed   By: Sandi Mariscal M.D.   On: 06/12/2013 11:13   Dg Chest Portable 1 View In  Am  06/17/2013   CLINICAL DATA:  Aortic valve disease  EXAM: PORTABLE CHEST - 1 VIEW  COMPARISON:  June 16, 2013  FINDINGS: There is patchy opacity in the left lower lobe, primarily representing atelectatic change. Lungs elsewhere clear. Heart is enlarged with pulmonary vascularity within normal limits. No adenopathy. Endotracheal tube and nasogastric tube have been removed. Swan-Ganz catheter tip is in the main pulmonary outflow tract. Patient is status post  aortic valve replacement.  IMPRESSION: Atelectasis with possible patchy infiltrate left lower lobe. Lungs elsewhere clear. No pneumothorax. Cardiomegaly stable. Central catheter tip in main pulmonary outflow tract. Endotracheal tube and nasogastric tube removed.   Electronically Signed   By: Lowella Grip M.D.   On: 06/17/2013 07:32   Dg Chest Portable 1 View  06/16/2013   CLINICAL DATA:  Postop cardiac surgery.  EXAM: PORTABLE CHEST - 1 VIEW  COMPARISON:  06/12/2013  FINDINGS: Since prior study, cardiac surgery has been performed. There There is a new prosthetic aortic valve. Cardiac silhouette is mildly enlarged. No mediastinal widening.  An endotracheal tube has its tip 4.1 cm above the chronic. The right internal jugular Swan-Ganz catheter has its tip ankle towards the right pulmonary artery. Nasogastric tube passes below the diaphragm into the stomach.  There is central vascular congestion with central interstitial thickening. Left lung base opacity is noted likely combination of a small effusion and atelectasis. No gross pneumothorax on this supine study.  IMPRESSION: 1. Status post cardiac surgery. 2. Mild central pulmonary edema. 3. No mediastinal widening.  No gross pneumothorax. 4. Support apparatus is well positioned.   Electronically Signed   By: Lajean Manes M.D.   On: 06/16/2013 11:45      ASSESSMENT AND PLAN: 1. Severe aortic stenosis POD#5 from transfemoral TAVR  2. Acute on chronic diastolic heart failure  3. CKD, stage 3,  stable  4. Severe pulmonary HTN  5. Type 2 DM - CBG's 232-238  Appears stable and anxious to go home. He has  b/l leg and foot edema which is gradually improving.   Plan:  Continue ASA/plavix  Resume home Lasix regimen. Will given a dose of metolazone to potentiate diuresis, with 2.5 mg tablets which he can take tomorrow and Tuesday. Has had 1430 cc of output in last 24 hours.  Repeat BMET early this week. K stable at 3.7 today.  Will hold off on restarting metoprolol 12.5 mg BID given hypotension with ambulation with cardiac rehab on 4/18, and resting HR of 59 bpm today.  Continue Januvia and Levemir. Doses may need adjustment in upcoming week given cessation of metformin due to heart failure and CKD.  Will arrange 30 day follow-up, but he should have a 1 week transition-of-care visit for close CHF follow-up and BMET. Discharge today.   Kate Sable, M.D., F.A.C.C.

## 2013-06-21 NOTE — Progress Notes (Signed)
Pt's BP checked again.  Pre walk BP was 123/53 sitting with heart rate of 65. Pt ambulated in hallway 250 ft. Pt became pale again. Pt's BP checked again when back in room sitting. BP was 84/37 with heart rate of 81. Pt still denies any dizziness but was slightly off balance when walking. PA notified.

## 2013-06-23 ENCOUNTER — Telehealth: Payer: Self-pay | Admitting: *Deleted

## 2013-06-23 NOTE — Telephone Encounter (Signed)
Patient called and asked about his DM medications.  Several were stopped at hospital d/c and he states his blood sugars have been running 250-300.  I spoke with Dr. [name] and he agreed that he can restart his Prandin and Lantus but to continue to hold his metformin due to his CKD and CHF.  I also advised him to make an appt with his DM specialist to help manage his DM.  He agrees.  No further questions.

## 2013-06-24 ENCOUNTER — Other Ambulatory Visit (INDEPENDENT_AMBULATORY_CARE_PROVIDER_SITE_OTHER): Payer: Medicare Other

## 2013-06-24 DIAGNOSIS — R0989 Other specified symptoms and signs involving the circulatory and respiratory systems: Secondary | ICD-10-CM

## 2013-06-24 DIAGNOSIS — I2789 Other specified pulmonary heart diseases: Secondary | ICD-10-CM

## 2013-06-24 DIAGNOSIS — E875 Hyperkalemia: Secondary | ICD-10-CM

## 2013-06-24 LAB — BASIC METABOLIC PANEL
BUN: 41 mg/dL — ABNORMAL HIGH (ref 6–23)
CALCIUM: 10.3 mg/dL (ref 8.4–10.5)
CHLORIDE: 91 meq/L — AB (ref 96–112)
CO2: 31 meq/L (ref 19–32)
CREATININE: 1.8 mg/dL — AB (ref 0.4–1.5)
GFR: 40.53 mL/min — ABNORMAL LOW (ref >60.00)
Glucose, Bld: 287 mg/dL — ABNORMAL HIGH (ref 70–99)
Potassium: 4.5 mEq/L (ref 3.5–5.1)
SODIUM: 133 meq/L — AB (ref 135–145)

## 2013-07-01 ENCOUNTER — Other Ambulatory Visit: Payer: Self-pay | Admitting: *Deleted

## 2013-07-01 DIAGNOSIS — I050 Rheumatic mitral stenosis: Secondary | ICD-10-CM

## 2013-07-03 LAB — HEMOGLOBIN A1C: Hgb A1c MFr Bld: 8.7 % — AB (ref 4.0–6.0)

## 2013-07-06 ENCOUNTER — Encounter: Payer: Self-pay | Admitting: Thoracic Surgery (Cardiothoracic Vascular Surgery)

## 2013-07-06 ENCOUNTER — Ambulatory Visit (INDEPENDENT_AMBULATORY_CARE_PROVIDER_SITE_OTHER): Payer: Medicare Other | Admitting: Thoracic Surgery (Cardiothoracic Vascular Surgery)

## 2013-07-06 ENCOUNTER — Other Ambulatory Visit: Payer: Self-pay | Admitting: *Deleted

## 2013-07-06 ENCOUNTER — Ambulatory Visit
Admission: RE | Admit: 2013-07-06 | Discharge: 2013-07-06 | Disposition: A | Discharge status: Home / Self Care | Payer: Medicare Other | Source: Ambulatory Visit | Attending: Thoracic Surgery (Cardiothoracic Vascular Surgery) | Admitting: Thoracic Surgery (Cardiothoracic Vascular Surgery)

## 2013-07-06 VITALS — BP 113/59 | HR 80 | Resp 20 | Ht 70.0 in | Wt 236.0 lb

## 2013-07-06 DIAGNOSIS — I359 Nonrheumatic aortic valve disorder, unspecified: Secondary | ICD-10-CM

## 2013-07-06 DIAGNOSIS — I350 Nonrheumatic aortic (valve) stenosis: Secondary | ICD-10-CM

## 2013-07-06 DIAGNOSIS — Z952 Presence of prosthetic heart valve: Secondary | ICD-10-CM

## 2013-07-06 DIAGNOSIS — Z951 Presence of aortocoronary bypass graft: Secondary | ICD-10-CM

## 2013-07-06 DIAGNOSIS — Z954 Presence of other heart-valve replacement: Secondary | ICD-10-CM

## 2013-07-06 LAB — BASIC METABOLIC PANEL
BUN: 38 mg/dL — AB (ref 6–23)
CHLORIDE: 97 meq/L (ref 96–112)
CO2: 26 mEq/L (ref 19–32)
Calcium: 10 mg/dL (ref 8.4–10.5)
Creat: 1.8 mg/dL — ABNORMAL HIGH (ref 0.50–1.35)
Glucose, Bld: 172 mg/dL — ABNORMAL HIGH (ref 70–99)
Potassium: 4.9 mEq/L (ref 3.5–5.3)
SODIUM: 137 meq/L (ref 135–145)

## 2013-07-06 NOTE — Progress Notes (Signed)
[facility]       CARDIOTHORACIC SURGERY NOTE  Referring Provider is [name], MD PCP is [name], MD   HPI:  Patient returns for followup status post transcatheter aortic valve replacement via open right transfemoral approach on 06/16/2013. His postoperative recovery has been entirely uncomplicated.  During his hospitalization metformin was discontinued because of his underlying congestive heart failure and chronic kidney disease. Since hospital discharge she has done exceptionally well and he returns for routine postop followup today.  He continued to lose weight until over the past week during which time his weight seems to have stabilized. His exercise tolerance is remarkably good and in fact he states that he feels much better than he has for several years. His lower extremity edema has now almost completely resolved. He has no pain whatsoever. He has not had any shortness of breath. He denies any palpitations or dizzy spells. His glycemic control was up and down initially, but over the past week his fasting blood sugars have been all less than 150. He is now taking fairly high dose Lantus insulin 3 times a day. Overall he is delighted with his progress and he has no complaints.   Current Outpatient Prescriptions  Medication Sig Dispense Refill  . ALPRAZolam (XANAX) 0.25 MG tablet Take 1 tablet (0.25 mg total) by mouth 2 (two) times daily as needed for anxiety.  10 tablet  0  . aspirin EC 81 MG tablet Take 81 mg by mouth daily.      . clopidogrel (PLAVIX) 75 MG tablet Take 1 tablet (75 mg total) by mouth daily with breakfast.  75 tablet  11  . Coenzyme Q10 300 MG CAPS Take 300 mg by mouth daily.       Marland Kitchen ezetimibe (ZETIA) 10 MG tablet Take 1 tablet (10 mg total) by mouth every evening.  30 tablet  6  . fish oil-omega-3 fatty acids 1000 MG capsule Take 1 g by mouth 2 (two) times daily.       . furosemide (LASIX) 80 MG  tablet Take 1 tablet (80 mg total) by mouth 2 (two) times daily.  60 tablet  5  . insulin detemir (LEVEMIR) 100 UNIT/ML injection Inject 0.4 mLs (40 Units total) into the skin daily.  10 mL  11  . JANUVIA 100 MG tablet Take 100 mg by mouth at bedtime.       . methocarbamol (ROBAXIN) 500 MG tablet Take 500 mg by mouth daily as needed (leg cramps).      . nitroGLYCERIN (NITROSTAT) 0.4 MG SL tablet Place 1 tablet (0.4 mg total) under the tongue every 5 (five) minutes as needed for chest pain. May repeat up to 3 times as needed  30 tablet  5  . pantoprazole (PROTONIX) 40 MG tablet Take 40 mg by mouth every morning.      . potassium chloride SA (K-DUR,KLOR-CON) 20 MEQ tablet Take 20-40 mEq by mouth 2 (two) times daily. 2 tabs in the morning and 1 tab in the evening      . Powders (ANTI MONKEY BUTT) POWD Apply 1 application topically as needed (burning itching drying up). Apply to legs and rub in      . ranitidine (ZANTAC) 150 MG tablet Take 150 mg by mouth 2 (two) times daily.       . sertraline (ZOLOFT) 50 MG tablet TAKE 1 TABLET BY MOUTH DAILY  90 tablet  1  . Vitamin D,  Ergocalciferol, (DRISDOL) 50000 UNITS CAPS Take 50,000 Units by mouth every 7 (seven) days. Take every week on saturday      . rosuvastatin (CRESTOR) 20 MG tablet Take 1 tablet (20 mg total) by mouth at bedtime.  30 tablet  6  . [DISCONTINUED] promethazine (PHENERGAN) 25 MG tablet Take 1/2 to 1 tablet up to every 6 hours as needed for nausea.        No current facility-administered medications for this visit.      Physical Exam:   BP 113/59  Pulse 80  Resp 20  Ht 5\' 10"  (1.778 m)  Wt 236 lb (107.049 kg)  BMI 33.86 kg/m2  SpO2 96%  General:  Well-appearing  Chest:   Clear to auscultation  CV:   Regular rate and rhythm without murmur  Incisions:  Healing nicely  Abdomen:  Soft nontender  Extremities:  Warm and well perfused with trivial lower extremity edema  Diagnostic Tests:  CHEST 2 VIEW  COMPARISON: 06/17/2013    FINDINGS:  Lungs are clear. Suspected calcified granuloma at the lateral right  lung apex. No pleural effusion or pneumothorax.  The heart is top-normal in size. Prosthetic aortic valve.  Postsurgical changes related to prior CABG.  Degenerative changes of the visualized thoracolumbar spine.  IMPRESSION:  No evidence of acute cardiopulmonary disease.  Electronically Signed  By: Julian Hy M.D.  On: 07/06/2013 13:34    Impression:  Patient looks remarkably well just 3 weeks following transcatheter aortic valve replacement. He already feels dramatically different and better than he has for quite some time. He now only gets short of breath if he pushes himself physically and he no longer has significant lower extremity edema.   Plan:  I've encouraged patient to continue to increase his physical activity as tolerated without any particular limitations at this time. He may start driving an automobile. I've encouraged him to enroll and participate in outpatient cardiac rehabilitation program. He has many questions about options for diabetes management, and I suggested that he schedule an appointment with his endocrinologist as soon as practical. We have not made any changes to his current medications. He is scheduled to see Dr. [name] later this month at which time a routine followup echocardiogram will be performed.  We will check a basic metabolic panel today to reassess his renal function.   Valentina Gu. Roxy Manns, MD 07/06/2013 2:46 PM

## 2013-07-06 NOTE — Patient Instructions (Signed)
Patient may resume unrestricted physical activity without any particular limitations at this time.  The patient may return to driving an automobile as long as they are no longer requiring oral narcotic pain relievers during the daytime.  It would be wise to start driving only short distances during the daylight and gradually increase from there as they feel comfortable.  The patient is reminded to make every effort to keep their diabetes under very tight control.  They should follow up closely with their primary care physician or endocrinologist and strive to keep their hemoglobin A1c levels as low as possible, preferably near or below 6.0

## 2013-07-16 ENCOUNTER — Other Ambulatory Visit: Payer: Self-pay

## 2013-07-16 MED ORDER — PANTOPRAZOLE SODIUM 40 MG PO TBEC: 40.0000 mg | DELAYED_RELEASE_TABLET | Freq: Every morning | ORAL | Status: DC

## 2013-07-17 ENCOUNTER — Other Ambulatory Visit (HOSPITAL_COMMUNITY): Payer: Self-pay | Admitting: Radiology

## 2013-07-17 ENCOUNTER — Encounter: Payer: Self-pay | Admitting: Cardiovascular Disease

## 2013-07-17 ENCOUNTER — Ambulatory Visit (HOSPITAL_COMMUNITY): Payer: Medicare Other | Attending: Cardiology | Admitting: Radiology

## 2013-07-17 ENCOUNTER — Ambulatory Visit (INDEPENDENT_AMBULATORY_CARE_PROVIDER_SITE_OTHER): Payer: Medicare Other | Admitting: Cardiovascular Disease

## 2013-07-17 VITALS — BP 124/58 | HR 72 | Ht 70.0 in | Wt 228.2 lb

## 2013-07-17 DIAGNOSIS — F411 Generalized anxiety disorder: Secondary | ICD-10-CM

## 2013-07-17 DIAGNOSIS — I359 Nonrheumatic aortic valve disorder, unspecified: Secondary | ICD-10-CM | POA: Insufficient documentation

## 2013-07-17 DIAGNOSIS — R072 Precordial pain: Secondary | ICD-10-CM

## 2013-07-17 DIAGNOSIS — Z954 Presence of other heart-valve replacement: Secondary | ICD-10-CM

## 2013-07-17 DIAGNOSIS — I25810 Atherosclerosis of coronary artery bypass graft(s) without angina pectoris: Secondary | ICD-10-CM

## 2013-07-17 DIAGNOSIS — F419 Anxiety disorder, unspecified: Secondary | ICD-10-CM

## 2013-07-17 MED ORDER — ALPRAZOLAM 0.25 MG PO TABS: 0.2500 mg | ORAL_TABLET | Freq: Two times a day (BID) | ORAL | Status: AC | PRN

## 2013-07-17 MED ORDER — METOPROLOL TARTRATE 25 MG PO TABS: 12.5000 mg | ORAL_TABLET | Freq: Two times a day (BID) | ORAL | Status: DC

## 2013-07-17 NOTE — Patient Instructions (Signed)
Your physician has recommended you make the following change in your medication: RESTART Metoprolol Tartrate 25mg  take one-half tablet by mouth twice a day  Your physician recommends that you return for lab work in: 3 MONTHS  (BMP)  Your physician recommends that you schedule a follow-up appointment in: 3 MONTHS with Dr [name]

## 2013-07-17 NOTE — Progress Notes (Signed)
Echocardiogram performed.  

## 2013-07-20 ENCOUNTER — Encounter: Payer: Self-pay | Admitting: Cardiovascular Disease

## 2013-07-20 NOTE — Progress Notes (Signed)
HPI:  [name] [name] is a 73 year-old male returning for follow-up now one month out from TAVR for treatment of severe aortic stenosis. The patient has rheumatic heart disease with residual mitral stenosis. He has extensive CAD as outlined in previous notes.   Overall the patient is doing quite well. He feels dramatically better than prior to TAVR. He has mild dyspnea with exertion, but is able to do much more physical activity now. He denies chest pain, lightheadedness, or syncope. Leg swelling is improved and appetite is improving as well. He is compliant with his meds.   Outpatient Encounter Prescriptions as of 07/17/2013  Medication Sig  . aspirin EC 81 MG tablet Take 81 mg by mouth daily.  . clopidogrel (PLAVIX) 75 MG tablet Take 1 tablet (75 mg total) by mouth daily with breakfast.  . Coenzyme Q10 300 MG CAPS Take 300 mg by mouth daily.   Marland Kitchen ezetimibe (ZETIA) 10 MG tablet Take 1 tablet (10 mg total) by mouth every evening.  . fish oil-omega-3 fatty acids 1000 MG capsule Take 1 g by mouth 2 (two) times daily.   . furosemide (LASIX) 80 MG tablet Take 2 tablets in the morning and 1 tablet in the evening  . insulin detemir (LEVEMIR) 100 UNIT/ML injection Inject 0.4 mLs (40 Units total) into the skin daily.  Marland Kitchen JANUVIA 100 MG tablet Take 100 mg by mouth at bedtime.   . methocarbamol (ROBAXIN) 500 MG tablet Take 500 mg by mouth daily as needed (leg cramps).  . nitroGLYCERIN (NITROSTAT) 0.4 MG SL tablet Place 1 tablet (0.4 mg total) under the tongue every 5 (five) minutes as needed for chest pain. May repeat up to 3 times as needed  . pantoprazole (PROTONIX) 40 MG tablet Take 1 tablet (40 mg total) by mouth every morning.  . potassium chloride SA (K-DUR,KLOR-CON) 20 MEQ tablet Take 20 mEq by mouth 2 (two) times daily.   . Powders (ANTI MONKEY BUTT) POWD Apply 1 application topically as needed (burning itching drying up). Apply to legs and rub in  . ranitidine (ZANTAC) 150 MG tablet Take 150 mg by  mouth 2 (two) times daily.   . sertraline (ZOLOFT) 50 MG tablet TAKE 1 TABLET BY MOUTH DAILY  . [DISCONTINUED] furosemide (LASIX) 80 MG tablet Take 1 tablet (80 mg total) by mouth 2 (two) times daily.  Marland Kitchen ALPRAZolam (XANAX) 0.25 MG tablet Take 1 tablet (0.25 mg total) by mouth 2 (two) times daily as needed for anxiety.  . metoprolol tartrate (LOPRESSOR) 25 MG tablet Take 0.5 tablets (12.5 mg total) by mouth 2 (two) times daily.  . rosuvastatin (CRESTOR) 20 MG tablet Take 1 tablet (20 mg total) by mouth at bedtime.  . Vitamin D, Ergocalciferol, (DRISDOL) 50000 UNITS CAPS Take 50,000 Units by mouth every 7 (seven) days. Take every week on saturday  . [DISCONTINUED] ALPRAZolam (XANAX) 0.25 MG tablet Take 1 tablet (0.25 mg total) by mouth 2 (two) times daily as needed for anxiety.    Allergies  Allergen Reactions  . Adhesive [Tape] Rash    blistered  . Celecoxib Rash    Past Medical History  Diagnosis Date  . GERD (gastroesophageal reflux disease)   . Hyperlipidemia   . Hypertension   . Diabetes mellitus   . Carotid artery disease   . Cellulitis and abscess of leg, except foot   . Leg edema   . Right knee sprain   . Cramps, muscle, general   . Low back pain   .  Hiatal hernia   . Chills   . Hearing loss   . Nasal congestion   . Cough   . Wheezing   . Generalized headaches   . CAD (coronary artery disease) 11/05/1996    a. CABG x6 by Dr Redmond Pulling - LIMA to Diag+LAD, SVG to OM1+OM2, SVG to PDA+RPL, b. VG->RCA known to be occluded;  c. 05/2012 PCI/Rota/BMS to m/dRCA w/ 3.5x15 Vision BMS x 2 post-dil to 3.75.  Marland Kitchen Mitral stenosis     a. moderate by echo 04/2012.  Marland Kitchen Chronic diastolic congestive heart failure     a. 04/2012 Echo: EF 55-60-%  . Aortic stenosis     a. 04/2012 s/p baloon valvuloplasty w/ reduction in gradient from 47 to 27.  . Pulmonary hypertension     a. 05/2012 RH cath: PA 97/32(54)  . Venous insufficiency (chronic) (peripheral)   . Sleep apnea     no cpap  sleep study >5 yrs   . Shortness of breath   . Anxiety   . Arthritis   . 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    41mm Edwards Sapien XT transcatheter heart valve placed via open right transfemoral approach    ROS: Negative except as per HPI  BP 124/58  Pulse 72  Ht 5\' 10"  (1.778 m)  Wt 103.534 kg (228 lb 4 oz)  BMI 32.75 kg/m2  PHYSICAL EXAM: Pt is alert and oriented, NAD HEENT: normal Neck: JVP - normal, carotids 2+= without bruits Lungs: CTA bilaterally CV: RRR with grade 1/6 diastolic decrescendo murmur at the LSB Abd: soft, NT, Positive BS, no hepatomegaly Ext: no C/C/E, distal pulses intact and equal Skin: warm/dry no rash  2D Echo: Study Conclusions  - Left ventricle: The cavity size was normal. Wall thickness was increased in a pattern of moderate LVH. Systolic function was normal. The estimated ejection fraction was in the range of 55% to 60%. There is hypokinesis of the apical myocardium. The study is not technically sufficient to allow evaluation of LV diastolic function. - Ventricular septum: The contour showed diastolic flattening and systolic flattening. - Aortic valve: A bioprosthesis was present. Trivial regurgitation. - Mitral valve: Severely calcified annulus. The findings are consistent with severe stenosis. Valve area by pressure half-time: 1.19cm^2. - Left atrium: The atrium was severely dilated. - Right ventricle: The cavity size was mildly dilated. Systolic function was mildly reduced. - Right atrium: The atrium was mildly dilated. Impressions:  - Normal LV function; apical hypokinesis; S/P TAVR with trace perivalvular AI; severe MAC with severe MS by mean gradient (14 mmHg); findings suggetive of significant pulmonary hypertension (flattened septum systole and diastole; RVE with reduced function). Compared to 06/17/13, mitral valve gradient has increased; prosthetic aortic valve continues to function appropriately.   ASSESSMENT AND  PLAN: 1. Severe aortic stenosis now one month out from TAVR  2. Chronic diastolic heart failure, currently NYHA Functional Class 2 3. Severe diffuse CAD 4. Type 2 DM with complications, insulin-requiring  He has made excellent clinical progress after TAVR. Despite residual severe mitral stenosis and pulmonary HTN, his clinical symptoms are dramatically better. I have reviewed his echo images and there is one view that demonstrates trace - mild perivalvular AI but certainly an acceptable result considering heavy annular calcification. Will ask him to resume metoprolol at low-dose of 12.5 mg BID and will see him back in 3 months with labs prior to that visit.   [name] 07/20/2013 11:16 PM

## 2013-07-24 ENCOUNTER — Other Ambulatory Visit: Payer: Self-pay | Admitting: Cardiology

## 2013-08-07 ENCOUNTER — Other Ambulatory Visit: Payer: Self-pay | Admitting: *Deleted

## 2013-08-07 MED ORDER — FUROSEMIDE 80 MG PO TABS: ORAL_TABLET | ORAL | Status: DC

## 2013-09-02 ENCOUNTER — Other Ambulatory Visit: Payer: Self-pay | Admitting: Cardiology

## 2013-09-07 ENCOUNTER — Telehealth: Payer: Self-pay | Admitting: Cardiovascular Disease

## 2013-09-07 NOTE — Telephone Encounter (Signed)
New message      Pt is having bp problems----it is too low. Please call

## 2013-09-07 NOTE — Telephone Encounter (Signed)
Please arrange PA/NP visit in case symptoms don't improve. If he remains asymptomatic, he can cx appt.

## 2013-09-07 NOTE — Telephone Encounter (Signed)
Wife calling stating he had 3-4 episodes yesterday of being light headed, feeling like he was going to pass out, unsteady gait, and SOB. States he has slight swelling in legs but not any more than usual.  States they did not take his BP yesterday but today BP 135/68. States they took his blood sugar yesterday and was a little high. She states he had episode like this about 1 month ago and called the office but never heard back from anyone here. She states he was not experiencing signs of a stroke. No CP. Yesterday's episodes were the worse that he has had. He feels OK today.  Advised Dr. [name] not in office today but will forward to him and his nurse Weyman Rodney.

## 2013-09-08 NOTE — Telephone Encounter (Signed)
I spoke with pt wife to offer a NP appointment. Wife states that pt was up most of the night & has urinated quite a bit.  States that he does seem to feel better today since "getting rid of that fluid"  Declined appointment today. She will call back if needing to schedule.  States husband is not at home right now.  Reassurance given Horton Chin RN

## 2013-10-07 ENCOUNTER — Telehealth (HOSPITAL_COMMUNITY): Payer: Self-pay | Admitting: Cardiac Rehabilitation

## 2013-10-07 NOTE — Telephone Encounter (Signed)
Attempts have been made to contact pt to enroll in cardiac rehab services with no response from pt via phone or letter.

## 2013-10-10 ENCOUNTER — Other Ambulatory Visit: Payer: Self-pay | Admitting: Cardiovascular Disease

## 2013-11-05 NOTE — Telephone Encounter (Signed)
error 

## 2013-11-06 ENCOUNTER — Other Ambulatory Visit: Payer: Self-pay | Admitting: Cardiovascular Disease

## 2013-11-10 ENCOUNTER — Other Ambulatory Visit: Payer: Medicare Other

## 2013-11-10 ENCOUNTER — Ambulatory Visit (INDEPENDENT_AMBULATORY_CARE_PROVIDER_SITE_OTHER): Payer: Medicare Other | Admitting: Cardiovascular Disease

## 2013-11-10 ENCOUNTER — Encounter: Payer: Self-pay | Admitting: Cardiovascular Disease

## 2013-11-10 VITALS — BP 140/64 | HR 77 | Ht 70.0 in | Wt 233.8 lb

## 2013-11-10 DIAGNOSIS — I359 Nonrheumatic aortic valve disorder, unspecified: Secondary | ICD-10-CM

## 2013-11-10 DIAGNOSIS — I25810 Atherosclerosis of coronary artery bypass graft(s) without angina pectoris: Secondary | ICD-10-CM

## 2013-11-10 NOTE — Progress Notes (Signed)
HPI:   73 year old gentleman returning for followup evaluation. The patient has been followed for extensive rheumatic heart disease. He underwent 6 vessel CABG in 1998. He's undergone PCI procedures, most recently with rotational atherectomy and bare-metal stenting of the native right coronary artery in 2014. 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 He had an uncomplicated postoperative course. His postoperative echo demonstrated normal function of his prosthetic aortic valve, severe mitral stenosis which was known from previous studies, preserved LV systolic function with the exception of apical hypokinesis, and severe pulmonary hypertension.  Clinically, the patient is doing quite well. He continues to feel 100% better than he did before TAVR. He has no shortness of breath at rest. He is dyspneic with walking any moderate distance. He states that he still has "good days and bad days." He denies orthopnea or PND. He's had no lightheadedness or syncope. He's taken a few additional doses of furosemide over the past 3 months. He reports stable weight and he has leg swelling remains greatly improved compared to his baseline. He's been compliant with medications.  Outpatient Encounter Prescriptions as of 11/10/2013  Medication Sig  . ALPRAZolam (XANAX) 0.25 MG tablet Take 1 tablet (0.25 mg total) by mouth 2 (two) times daily as needed for anxiety.  Marland Kitchen aspirin EC 81 MG tablet Take 81 mg by mouth daily.  . clopidogrel (PLAVIX) 75 MG tablet Take 1 tablet (75 mg total) by mouth daily with breakfast.  . Coenzyme Q10 300 MG CAPS Take 300 mg by mouth daily.   Marland Kitchen ezetimibe (ZETIA) 10 MG tablet Take 1 tablet (10 mg total) by mouth every evening.  . fish oil-omega-3 fatty acids 1000 MG capsule Take 1 g by mouth 2 (two) times daily.   . furosemide (LASIX) 80 MG tablet Take 2  tablets in the morning and 1 tablet in the evening  . insulin detemir (LEVEMIR) 100 UNIT/ML injection Inject 0.4 mLs (40 Units total) into the skin daily.  Marland Kitchen JANUVIA 100 MG tablet Take 100 mg by mouth at bedtime.   . methocarbamol (ROBAXIN) 500 MG tablet Take 500 mg by mouth daily as needed (leg cramps).  . nitroGLYCERIN (NITROSTAT) 0.4 MG SL tablet Place 1 tablet (0.4 mg total) under the tongue every 5 (five) minutes as needed for chest pain. May repeat up to 3 times as needed  . pantoprazole (PROTONIX) 40 MG tablet Take 1 tablet (40 mg total) by mouth every morning.  . potassium chloride SA (K-DUR,KLOR-CON) 20 MEQ tablet TAKE TWO (2) TABLETS BY MOUTH DAILY  . Powders (ANTI MONKEY BUTT) POWD Apply 1 application topically as needed (burning itching drying up). Apply to legs and rub in  . ranitidine (ZANTAC) 150 MG tablet Take 150 mg by mouth 2 (two) times daily.   . sertraline (ZOLOFT) 50 MG tablet TAKE 1 TABLET BY MOUTH DAILY  . Vitamin D, Ergocalciferol, (DRISDOL) 50000 UNITS CAPS Take 50,000 Units by mouth every 7 (seven) days. Take every week on saturday  . rosuvastatin (CRESTOR) 20 MG tablet Take 1 tablet (20 mg total) by mouth at bedtime.  . [DISCONTINUED] metoprolol tartrate (LOPRESSOR) 25 MG tablet Take 0.5 tablets (12.5 mg total) by mouth 2 (two) times daily.  . [DISCONTINUED] metoprolol tartrate (LOPRESSOR) 25 MG tablet Take 0.5 tablets (12.5 mg total) by mouth 2 (two) times daily.    Allergies  Allergen Reactions  . Adhesive [  Tape] Rash    blistered  . Celecoxib Rash    Past Medical History  Diagnosis Date  . GERD (gastroesophageal reflux disease)   . Hyperlipidemia   . Hypertension   . Diabetes mellitus   . Carotid artery disease   . Cellulitis and abscess of leg, except foot   . Leg edema   . Right knee sprain   . Cramps, muscle, general   . Low back pain   . Hiatal hernia   . Chills   . Hearing loss   . Nasal congestion   . Cough   . Wheezing   . Generalized  headaches   . CAD (coronary artery disease) 11/05/1996    a. CABG x6 by Dr Redmond Pulling - LIMA to Diag+LAD, SVG to OM1+OM2, SVG to PDA+RPL, b. VG->RCA known to be occluded;  c. 05/2012 PCI/Rota/BMS to m/dRCA w/ 3.5x15 Vision BMS x 2 post-dil to 3.75.  Marland Kitchen Mitral stenosis     a. moderate by echo 04/2012.  Marland Kitchen Chronic diastolic congestive heart failure     a. 04/2012 Echo: EF 55-60-%  . Aortic stenosis     a. 04/2012 s/p baloon valvuloplasty w/ reduction in gradient from 47 to 27.  . Pulmonary hypertension     a. 05/2012 RH cath: PA 97/32(54)  . Venous insufficiency (chronic) (peripheral)   . Sleep apnea     no cpap  sleep study >5 yrs  . Shortness of breath   . Anxiety   . Arthritis   . 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    28mm Edwards Sapien XT transcatheter heart valve placed via open right transfemoral approach    ROS: Negative except as per HPI  BP 140/64  Pulse 77  Ht 5\' 10"  (1.778 m)  Wt 233 lb 12.8 oz (106.051 kg)  BMI 33.55 kg/m2  PHYSICAL EXAM: Pt is alert and oriented, NAD HEENT: normal Neck: JVP - normal, carotids 2+= with bilateral bruits Lungs: CTA bilaterally CV: RRR with grade 2/6 mid-peaking systolic murmur at the RUSB, no diastolic murmur Abd: soft, NT, Positive BS, no hepatomegaly Ext: trace edema, chronic stasis changes  2D Echo 07-17-2013: Study Conclusions  - Left ventricle: The cavity size was normal. Wall thickness was increased in a pattern of moderate LVH. Systolic function was normal. The estimated ejection fraction was in the range of 55% to 60%. There is hypokinesis of the apical myocardium. The study is not technically sufficient to allow evaluation of LV diastolic function. - Ventricular septum: The contour showed diastolic flattening and systolic flattening. - Aortic valve: A bioprosthesis was present. Trivial regurgitation. - Mitral valve: Severely calcified annulus. The findings are consistent with severe stenosis. Valve area  by pressure half-time: 1.19cm^2. - Left atrium: The atrium was severely dilated. - Right ventricle: The cavity size was mildly dilated. Systolic function was mildly reduced. - Right atrium: The atrium was mildly dilated. Impressions:  - Normal LV function; apical hypokinesis; S/P TAVR with trace perivalvular AI; severe MAC with severe MS by mean gradient (14 mmHg); findings suggetive of significant pulmonary hypertension (flattened septum systole and diastole; RVE with reduced function). Compared to 06/17/13, mitral valve gradient has increased; prosthetic aortic valve continues to function appropriately.  ASSESSMENT AND PLAN: 1. Chronic diastolic heart failure secondary to ischemic and valvular heart disease. The patient appears stable on his current medical program. He will continue on his current dose of furosemide. He was unable to tolerate a low-dose beta blocker because of lightheadedness and hypotension. He  is not a candidate for an ACE/ARB because of underlying chronic kidney disease and risk of acute kidney injury.  2. Coronary artery disease, native vessel. Patient is status post CABG and PCI. He has extensive small vessel coronary disease. The patient has no symptoms of angina. He will continue on long-term dual antiplatelet therapy.  3. Severe aortic stenosis status post TAVR. Normal bioprosthetic valve function based on one month postoperative echocardiogram. Continue aspirin and Plavix. Will need SBE prophylaxis when indicated.  4. Severe mitral valve stenosis. Continue clinical followup. Fortunately the patient is maintaining sinus rhythm.  5. Type 2 diabetes with renal manifestations. Diabetes is managed by Dr. Elyse Hsu. The patient asked me about restarting metformin. His last creatinine on file was 1.8 mg/dL. I advised that he stay off of metformin until he speaks with Dr. Elyse Hsu as typically a creatinine greater than 1.5 would be a contraindication.  [name]  MD 11/10/2013 5:50 PM

## 2013-11-10 NOTE — Patient Instructions (Signed)
Your physician recommends that you continue on your current medications as directed. Please refer to the Current Medication list given to you today.  Lab Today: [facility]  Your physician wants you to follow-up in: 4 months with Dr.Cooper You will receive a reminder letter in the mail two months in advance. If you don't receive a letter, please call our office to schedule the follow-up appointment.

## 2013-11-30 ENCOUNTER — Other Ambulatory Visit: Payer: Self-pay | Admitting: Family Medicine

## 2013-11-30 NOTE — Telephone Encounter (Signed)
Last refill 03/11/13, last office visit 02/19/13. Is it okay to refill medication?

## 2013-11-30 NOTE — Telephone Encounter (Signed)
Sent. Thanks.   

## 2013-12-11 ENCOUNTER — Other Ambulatory Visit: Payer: Self-pay | Admitting: Cardiovascular Disease

## 2013-12-17 ENCOUNTER — Other Ambulatory Visit: Payer: Self-pay | Admitting: Family Medicine

## 2013-12-17 ENCOUNTER — Other Ambulatory Visit: Payer: Self-pay | Admitting: Cardiovascular Disease

## 2014-01-21 ENCOUNTER — Other Ambulatory Visit: Payer: Self-pay | Admitting: Family Medicine

## 2014-01-21 NOTE — Telephone Encounter (Signed)
Send once he has a f/u OV scheduled.  Thanks.

## 2014-01-21 NOTE — Telephone Encounter (Signed)
Electronic refill request. Last refill in October states he needs a follow up prior to further refills.  No follow up appt since that time and none scheduled in the future.  Please advise.

## 2014-01-22 ENCOUNTER — Other Ambulatory Visit: Payer: Self-pay

## 2014-01-22 NOTE — Telephone Encounter (Signed)
V/M left requesting refill sertraline to [facility]; pt going out of town on 01/24/14; pt last seen 02/19/2013.no future appt scheduled.Please advise.

## 2014-01-22 NOTE — Telephone Encounter (Signed)
Lm on pts vm requesting a call back to schedule OV 

## 2014-01-24 MED ORDER — SERTRALINE HCL 50 MG PO TABS: ORAL_TABLET | ORAL | Status: DC

## 2014-01-24 NOTE — Telephone Encounter (Signed)
Sent.  Thanks.  Need f/u.

## 2014-01-25 NOTE — Telephone Encounter (Signed)
Patient notified by telephone and follow-up appointment scheduled.

## 2014-01-31 ENCOUNTER — Telehealth: Payer: Self-pay | Admitting: Family Medicine

## 2014-01-31 NOTE — Telephone Encounter (Signed)
Called and LMOVM that I will not be in clinic tomorrow due to an illness and that the clinic will call about rescheduling.

## 2014-02-01 ENCOUNTER — Ambulatory Visit: Payer: Medicare Other | Admitting: Family Medicine

## 2014-02-09 ENCOUNTER — Ambulatory Visit: Payer: Medicare Other | Admitting: Family Medicine

## 2014-02-10 ENCOUNTER — Other Ambulatory Visit: Payer: Self-pay | Admitting: Family Medicine

## 2014-02-10 NOTE — Telephone Encounter (Signed)
Received refill request electronically from pharmacy. Last refill 01/24/14 #30. Patient has an appointment scheduled 02/11/14. Is it okay to refill medication?

## 2014-02-11 ENCOUNTER — Encounter (HOSPITAL_COMMUNITY): Payer: Self-pay | Admitting: Cardiovascular Disease

## 2014-02-11 ENCOUNTER — Ambulatory Visit (INDEPENDENT_AMBULATORY_CARE_PROVIDER_SITE_OTHER): Payer: Medicare Other | Admitting: Family Medicine

## 2014-02-11 VITALS — BP 122/62 | HR 79 | Temp 97.6°F | Wt 226.2 lb

## 2014-02-11 DIAGNOSIS — F329 Major depressive disorder, single episode, unspecified: Secondary | ICD-10-CM

## 2014-02-11 DIAGNOSIS — F32A Depression, unspecified: Secondary | ICD-10-CM

## 2014-02-11 DIAGNOSIS — Z23 Encounter for immunization: Secondary | ICD-10-CM

## 2014-02-11 DIAGNOSIS — I25810 Atherosclerosis of coronary artery bypass graft(s) without angina pectoris: Secondary | ICD-10-CM

## 2014-02-11 DIAGNOSIS — E1149 Type 2 diabetes mellitus with other diabetic neurological complication: Secondary | ICD-10-CM

## 2014-02-11 DIAGNOSIS — M25511 Pain in right shoulder: Secondary | ICD-10-CM

## 2014-02-11 MED ORDER — INSULIN GLARGINE 100 UNIT/ML ~~LOC~~ SOLN: SUBCUTANEOUS | Status: DC

## 2014-02-11 MED ORDER — TRAMADOL HCL 50 MG PO TABS: 50.0000 mg | ORAL_TABLET | Freq: Every evening | ORAL | Status: DC | PRN

## 2014-02-11 NOTE — Telephone Encounter (Signed)
Sent. Thanks.   

## 2014-02-11 NOTE — Progress Notes (Signed)
Pre visit review using our clinic review tool, if applicable. No additional management support is needed unless otherwise documented below in the visit note.  F/u for depression.  Much improved overall on SSRI, mood improved, happier. This predates the TAVR issues, which he is really happy about the replacement and his current situation.  He isn't SOB and he feels well.  We talked about his SSRI and he didn't want to stop the medicine.    R shoulder pain, worse at night, trying to lay on that side.  Pain with external rotation.  No acute injury but had injured/overworked the joint mult times in the past, per patient report.  Tramadol had helped prev, mainly used at night to get comfortable enough to sleep.    He has had trouble paying for his DM2 meds.  D/w pt.   Meds, vitals, and allergies reviewed.   ROS: See HPI.  Otherwise, noncontributory.  nad ncat Mmm rrr ctab abd soft Ext w/o edema r shoulder with pain on ext rotation but no arm drop.

## 2014-02-11 NOTE — Patient Instructions (Addendum)
Ask Dr. Elyse Hsu about NPH insulin.  That may be cheaper.   Take tramadol for the shoulder pain but don't take more than 2 a day.  Take care.  Glad to see you.

## 2014-02-14 DIAGNOSIS — F32A Depression, unspecified: Secondary | ICD-10-CM | POA: Insufficient documentation

## 2014-02-14 DIAGNOSIS — F329 Major depressive disorder, single episode, unspecified: Secondary | ICD-10-CM | POA: Insufficient documentation

## 2014-02-14 DIAGNOSIS — M25519 Pain in unspecified shoulder: Secondary | ICD-10-CM | POA: Insufficient documentation

## 2014-02-14 NOTE — Assessment & Plan Note (Signed)
Per endo °

## 2014-02-14 NOTE — Assessment & Plan Note (Signed)
Much improved on SSRI, continue.  He agrees.  He doesn't want to stop the medicine.

## 2014-02-14 NOTE — Assessment & Plan Note (Signed)
Likely cuff pathology.  He isn't interested in intervention.  He can try prn low dose of tramadol, call back prn.  He agrees.

## 2014-02-19 ENCOUNTER — Other Ambulatory Visit: Payer: Self-pay

## 2014-02-19 MED ORDER — PANTOPRAZOLE SODIUM 40 MG PO TBEC: 40.0000 mg | DELAYED_RELEASE_TABLET | Freq: Every morning | ORAL | Status: DC

## 2014-03-05 DIAGNOSIS — S42201A Unspecified fracture of upper end of right humerus, initial encounter for closed fracture: Secondary | ICD-10-CM

## 2014-03-05 HISTORY — DX: Unspecified fracture of upper end of right humerus, initial encounter for closed fracture: S42.201A

## 2014-03-16 ENCOUNTER — Ambulatory Visit: Payer: Medicare Other | Admitting: Cardiovascular Disease

## 2014-03-30 ENCOUNTER — Encounter: Payer: Self-pay | Admitting: Cardiovascular Disease

## 2014-03-30 ENCOUNTER — Ambulatory Visit (INDEPENDENT_AMBULATORY_CARE_PROVIDER_SITE_OTHER): Payer: Medicare Other | Admitting: Cardiovascular Disease

## 2014-03-30 VITALS — BP 130/54 | HR 69 | Ht 70.0 in | Wt 225.8 lb

## 2014-03-30 DIAGNOSIS — I350 Nonrheumatic aortic (valve) stenosis: Secondary | ICD-10-CM

## 2014-03-30 DIAGNOSIS — Z954 Presence of other heart-valve replacement: Secondary | ICD-10-CM

## 2014-03-30 DIAGNOSIS — Z952 Presence of prosthetic heart valve: Secondary | ICD-10-CM

## 2014-03-30 NOTE — Patient Instructions (Addendum)
Your physician has requested that you have an echocardiogram in April 2016. Echocardiography is a painless test that uses sound waves to create images of your heart. It provides your doctor with information about the size and shape of your heart and how well your heart's chambers and valves are working. This procedure takes approximately one hour. There are no restrictions for this procedure.  Your physician recommends that you schedule a follow-up appointment in: April 2016 with Dr [name]  Your physician recommends that you continue on your current medications as directed. Please refer to the Current Medication list given to you today.

## 2014-03-30 NOTE — Progress Notes (Signed)
Cardiology Office Note   Date:  04/01/2014   ID:  [name] [name]., DOB 03/30/1940, MRN [id number]  PCP:  [name], MD  Cardiologist:  [name], MD    Chief Complaint  Patient presents with  . Shortness of Breath     History of Present Illness: [name] [name]. is a 74 y.o. male who presents for follow-up of valvular and coronary heart disease.   The patient has been followed for extensive rheumatic heart disease. He underwent 6 vessel CABG in 1998. He's undergone PCI procedures, most recently with rotational atherectomy and bare-metal stenting of the native right coronary artery in 2014. 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 He had an uncomplicated postoperative course. His postoperative echo demonstrated normal function of his prosthetic aortic valve, severe mitral stenosis which was known from previous studies, preserved LV systolic function with the exception of apical hypokinesis, and severe pulmonary hypertension.  He's had several episodes of postural dizziness and weakness. He has not had frank syncope. He denies chest pain or pressure. He continues to have exertional dyspnea, but remains greatly improved compared to his preoperative status. His blood sugar has been poorly controlled, but he's working on this with Dr Altheimer. Overall he is pleased with his quality of life and feels much better since TAVR.   Past Medical History  Diagnosis Date  . GERD (gastroesophageal reflux disease)   . Hyperlipidemia   . Hypertension   . Diabetes mellitus   . Carotid artery disease   . Cellulitis and abscess of leg, except foot   . Leg edema   . Right knee sprain   . Cramps, muscle, general   . Low back pain   . Hiatal hernia   . Chills   . Hearing loss   . Nasal congestion   . Cough   . Wheezing   . Generalized  headaches   . CAD (coronary artery disease) 11/05/1996    a. CABG x6 by Dr Redmond Pulling - LIMA to Diag+LAD, SVG to OM1+OM2, SVG to PDA+RPL, b. VG->RCA known to be occluded;  c. 05/2012 PCI/Rota/BMS to m/dRCA w/ 3.5x15 Vision BMS x 2 post-dil to 3.75.  Marland Kitchen Mitral stenosis     a. moderate by echo 04/2012.  Marland Kitchen Chronic diastolic congestive heart failure     a. 04/2012 Echo: EF 55-60-%  . Aortic stenosis     a. 04/2012 s/p baloon valvuloplasty w/ reduction in gradient from 47 to 27.  . Pulmonary hypertension     a. 05/2012 RH cath: PA 97/32(54)  . Venous insufficiency (chronic) (peripheral)   . Sleep apnea     no cpap  sleep study >5 yrs  . Shortness of breath   . Anxiety   . Arthritis   . 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 Ett cardiolite  12/31/2006    Mild-Mod distal inf/apical ischemia  . Open heart surgery    . Acute or chronic diastolic hf  4/0/ - 11/10/1189    RLE cellulitis - HOSP  . Doppler echocardiography  10/11/2008    Mild AS, Mild-Mod MS, Severe LVH  . Le arterial and venous US  10/11/2008    Art clear.  Venous Neg DVT  .  Appendectomy  12/25/2010    Dr Redmond Pulling  . Umbilical hernia repair  12/25/2010    Dr Redmond Pulling  . Hernia repair  03/13/30    umbilical hernia repair   . Tee without cardioversion  03/20/2012    Procedure: TRANSESOPHAGEAL ECHOCARDIOGRAM (TEE);  Surgeon: [name], MD;  Location: [facility];  Service: Cardiovascular;  Laterality: N/A;  . Cardiac catheterization    . Coronary artery bypass graft    . Transcatheter aortic valve replacement, transfemoral N/A 06/16/2013    Procedure: TRANSCATHETER AORTIC VALVE REPLACEMENT, TRANSFEMORAL;  Surgeon: [name], MD;  Location: [facility];  Service: Open Heart Surgery;  Laterality: N/A;  . Intraoperative transesophageal echocardiogram N/A 06/16/2013     Procedure: INTRAOPERATIVE TRANSESOPHAGEAL ECHOCARDIOGRAM;  Surgeon: [name], MD;  Location: [facility] OR;  Service: Open Heart Surgery;  Laterality: N/A;  . Percutaneous coronary rotoblator intervention (pci-r) N/A 05/07/2012    Procedure: PERCUTANEOUS CORONARY ROTOBLATOR INTERVENTION (PCI-R);  Surgeon: [name], MD;  Location: [facility] CATH LAB;  Service: Cardiovascular;  Laterality: N/A;  . Left and right heart catheterization with coronary angiogram N/A 03/20/2013    Procedure: LEFT AND RIGHT HEART CATHETERIZATION WITH CORONARY ANGIOGRAM;  Surgeon: Blane Ohara, MD;  Location: [facility] CATH LAB;  Service: Cardiovascular;  Laterality: N/A;    Current Outpatient Prescriptions  Medication Sig Dispense Refill  . ALPRAZolam (XANAX) 0.25 MG tablet Take 1 tablet (0.25 mg total) by mouth 2 (two) times daily as needed for anxiety. 10 tablet 0  . aspirin EC 81 MG tablet Take 81 mg by mouth daily.    . clopidogrel (PLAVIX) 75 MG tablet Take 1 tablet (75 mg total) by mouth daily with breakfast. 75 tablet 11  . Coenzyme Q10 300 MG CAPS Take 300 mg by mouth daily.     . fish oil-omega-3 fatty acids 1000 MG capsule Take 1 g by mouth 2 (two) times daily.     . furosemide (LASIX) 80 MG tablet TAKE 2 TABLETS BY MOUTH IN THE MORNING AND 1 TABLET IN THE EVENING 90 tablet 11  . insulin glargine (LANTUS) 100 UNIT/ML injection Per endocrine clinic    . JANUVIA 100 MG tablet Take 100 mg by mouth at bedtime.     . methocarbamol (ROBAXIN) 500 MG tablet Take 500 mg by mouth daily as needed (leg cramps).    . metolazone (ZAROXOLYN) 2.5 MG tablet Take 2.5 mg by mouth as needed. For fluid    . nitroGLYCERIN (NITROSTAT) 0.4 MG SL tablet Place 1 tablet (0.4 mg total) under the tongue every 5 (five) minutes as needed for chest pain. May repeat up to 3 times as needed 30 tablet 5  . pantoprazole (PROTONIX) 40 MG tablet Take 1 tablet (40 mg total) by mouth every morning. 30 tablet 1  . potassium chloride SA (K-DUR,KLOR-CON) 20 MEQ  tablet TAKE TWO (2) TABLETS BY MOUTH DAILY 60 tablet 6  . Powders (ANTI MONKEY BUTT) POWD Apply 1 application topically as needed (burning itching drying up). Apply to legs and rub in    . ranitidine (ZANTAC) 150 MG tablet Take 150 mg by mouth 2 (two) times daily.     . repaglinide (PRANDIN) 2 MG tablet Take 2 mg by mouth as directed. Sliding Scale    . sertraline (ZOLOFT) 50 MG tablet Take 1 tablet (50 mg total) by mouth daily. 30 tablet 5  . traMADol (ULTRAM) 50 MG tablet Take 1-2 tablets (50-100 mg total) by mouth at bedtime as needed for severe pain. [place]  tablet 2  . Vitamin D, Ergocalciferol, (DRISDOL) 50000 UNITS CAPS Take 50,000 Units by mouth every 7 (seven) days. Take every week on saturday    . ZETIA 10 MG tablet TAKE ONE TABLET BY MOUTH EACH EVENING 30 tablet 5  . rosuvastatin (CRESTOR) 20 MG tablet Take 1 tablet (20 mg total) by mouth at bedtime. 30 tablet 6  . [DISCONTINUED] promethazine (PHENERGAN) 25 MG tablet Take 1/2 to 1 tablet up to every 6 hours as needed for nausea.      No current facility-administered medications for this visit.    Allergies:   Adhesive and Celecoxib   Social History:  The patient  reports that he has never smoked. He has never used smokeless tobacco. He reports that he does not drink alcohol or use illicit drugs.   Family History:  The patient's  family history includes Diabetes in his other.    ROS:  Please see the history of present illness.  Otherwise, review of systems is positive for joint pains, shortness of breath, lightheadedness.  All other systems are reviewed and negative.    PHYSICAL EXAM: VS:  BP 130/54 mmHg  Pulse 69  Ht 5\' 10"  (1.778 m)  Wt 225 lb 12.8 oz (102.422 kg)  BMI 32.40 kg/m2 , BMI Body mass index is 32.4 kg/(m^2). GEN: Well nourished, well developed, in no acute distress HEENT: normal Neck: no JVD, carotid bruits, or masses Cardiac: RRR; 2/6 diastolic murmur at the LSB Respiratory:  clear to auscultation bilaterally,  normal work of breathing GI: soft, nontender, nondistended, + BS MS: no deformity or atrophy Skin: warm and dry, bilateral knee abrasions Neuro:  Strength and sensation are intact Psych: euthymic mood, full affect  EKG:  EKG is not ordered today.  Recent Labs: 06/12/2013: ALT 11 06/17/2013: Magnesium 2.5 06/19/2013: Hemoglobin 10.3*; Platelets 190 07/06/2013: BUN 38*; Creatinine 1.80*; Potassium 4.9; Sodium 137   Lipid Panel  No results found for: CHOL, TRIG, HDL, CHOLHDL, VLDL, LDLCALC, LDLDIRECT    Wt Readings from Last 3 Encounters:  03/30/14 225 lb 12.8 oz (102.422 kg)  02/11/14 226 lb 4 oz (102.626 kg)  11/10/13 233 lb 12.8 oz (106.051 kg)    Other studies Reviewed: 2D Echo 07/17/2013: Study Conclusions  - Left ventricle: The cavity size was normal. Wall thickness was increased in a pattern of moderate LVH. Systolic function was normal. The estimated ejection fraction was in the range of 55% to 60%. There is hypokinesis of the apical myocardium. The study is not technically sufficient to allow evaluation of LV diastolic function. - Ventricular septum: The contour showed diastolic flattening and systolic flattening. - Aortic valve: A bioprosthesis was present. Trivial regurgitation. - Mitral valve: Severely calcified annulus. The findings are consistent with severe stenosis. Valve area by pressure half-time: 1.19cm^2. - Left atrium: The atrium was severely dilated. - Right ventricle: The cavity size was mildly dilated. Systolic function was mildly reduced. - Right atrium: The atrium was mildly dilated. Impressions:  - Normal LV function; apical hypokinesis; S/P TAVR with trace perivalvular AI; severe MAC with severe MS by mean gradient (14 mmHg); findings suggetive of significant pulmonary hypertension (flattened septum systole and diastole; RVE with reduced function). Compared to 06/17/13, mitral valve gradient has increased; prosthetic  aortic valve continues to function appropriately.  ASSESSMENT AND PLAN: 1.  Valvular heart disease with mitral stenosis and s/p TAVR. Overall appears stable. Will repeat echo for 1 year interval study prior to his return visit in 3-4 months.   2.  Chronic diastolic heart failure: med regimen reviewed. Difficult to titration medications further with episodic hypotension and postural symptoms. He is on high-dose diuretics. Continue same program with as needed use of metolazone and scheduled Lasix BID.  3. CAD s/p CABG with severe diffuse native vessel CAD. No anginal symptoms. Remains on long-term DAPT With ASA and plavix.   4. Postural dizziness/hypotension. Discussed adequate hydration and caution with postural changes. I don't think there is much more we can do from a medication perspective to improve this.  Current medicines are reviewed with the patient today.  The patient does not have concerns regarding medicines.  The following changes have been made:  no change  Labs/ tests ordered today include: echo at time of f/u in April 2016  Orders Placed This Encounter  Procedures  . 2D Echocardiogram without contrast    Disposition:   FU with me in 3-4 months with an echo prior to that visit.   Signed, [name], MD  04/01/2014 1:38 PM    [facility] [address], [place], [place]  [address] Phone: [phone]; Fax: [phone]

## 2014-04-07 ENCOUNTER — Ambulatory Visit (INDEPENDENT_AMBULATORY_CARE_PROVIDER_SITE_OTHER)
Admission: RE | Admit: 2014-04-07 | Discharge: 2014-04-07 | Disposition: A | Discharge status: Home / Self Care | Payer: Medicare Other | Source: Ambulatory Visit | Attending: Family Medicine | Admitting: Family Medicine

## 2014-04-07 ENCOUNTER — Ambulatory Visit (INDEPENDENT_AMBULATORY_CARE_PROVIDER_SITE_OTHER): Payer: Medicare Other | Admitting: Family Medicine

## 2014-04-07 ENCOUNTER — Encounter: Payer: Self-pay | Admitting: Family Medicine

## 2014-04-07 VITALS — BP 110/52 | HR 85 | Temp 97.4°F | Wt 229.0 lb

## 2014-04-07 DIAGNOSIS — S4991XA Unspecified injury of right shoulder and upper arm, initial encounter: Secondary | ICD-10-CM

## 2014-04-07 NOTE — Progress Notes (Signed)
Subjective:   Patient ID: [name], male    DOB: 28-Jan-1941, 74 y.o.   MRN: [id number]  Weiland Tomich. is a pleasant 74 y.o. year old male patient of Dr. Damita Dunnings, new to me, who presents to clinic today with Shoulder Pain  on 04/07/2014  HPI: [place] backwards onto his right shoulder yesterday afternoon- "tripped over my foot."  Since then, severe pain right shoulder and unable to move arm or shoulder. Taking some Tramadol he had at home for pain without much relief.  Current Outpatient Prescriptions on File Prior to Visit  Medication Sig Dispense Refill  . ALPRAZolam (XANAX) 0.25 MG tablet Take 1 tablet (0.25 mg total) by mouth 2 (two) times daily as needed for anxiety. 10 tablet 0  . aspirin EC 81 MG tablet Take 81 mg by mouth daily.    . clopidogrel (PLAVIX) 75 MG tablet Take 1 tablet (75 mg total) by mouth daily with breakfast. 75 tablet 11  . Coenzyme Q10 300 MG CAPS Take 300 mg by mouth daily.     . fish oil-omega-3 fatty acids 1000 MG capsule Take 1 g by mouth 2 (two) times daily.     . furosemide (LASIX) 80 MG tablet TAKE 2 TABLETS BY MOUTH IN THE MORNING AND 1 TABLET IN THE EVENING 90 tablet 11  . insulin glargine (LANTUS) 100 UNIT/ML injection Per endocrine clinic    . JANUVIA 100 MG tablet Take 100 mg by mouth at bedtime.     . methocarbamol (ROBAXIN) 500 MG tablet Take 500 mg by mouth daily as needed (leg cramps).    . metolazone (ZAROXOLYN) 2.5 MG tablet Take 2.5 mg by mouth as needed. For fluid    . nitroGLYCERIN (NITROSTAT) 0.4 MG SL tablet Place 1 tablet (0.4 mg total) under the tongue every 5 (five) minutes as needed for chest pain. May repeat up to 3 times as needed 30 tablet 5  . pantoprazole (PROTONIX) 40 MG tablet Take 1 tablet (40 mg total) by mouth every morning. 30 tablet 1  . potassium chloride SA (K-DUR,KLOR-CON) 20 MEQ tablet TAKE TWO (2) TABLETS BY MOUTH DAILY 60 tablet 6  . Powders (ANTI MONKEY BUTT) POWD Apply 1 application topically as needed  (burning itching drying up). Apply to legs and rub in    . ranitidine (ZANTAC) 150 MG tablet Take 150 mg by mouth 2 (two) times daily.     . repaglinide (PRANDIN) 2 MG tablet Take 2 mg by mouth as directed. Sliding Scale    . sertraline (ZOLOFT) 50 MG tablet Take 1 tablet (50 mg total) by mouth daily. 30 tablet 5  . traMADol (ULTRAM) 50 MG tablet Take 1-2 tablets (50-100 mg total) by mouth at bedtime as needed for severe pain. 60 tablet 2  . Vitamin D, Ergocalciferol, (DRISDOL) 50000 UNITS CAPS Take 50,000 Units by mouth every 7 (seven) days. Take every week on saturday    . ZETIA 10 MG tablet TAKE ONE TABLET BY MOUTH EACH EVENING 30 tablet 5  . rosuvastatin (CRESTOR) 20 MG tablet Take 1 tablet (20 mg total) by mouth at bedtime. 30 tablet 6  . [DISCONTINUED] promethazine (PHENERGAN) 25 MG tablet Take 1/2 to 1 tablet up to every 6 hours as needed for nausea.      No current facility-administered medications on file prior to visit.    Allergies  Allergen Reactions  . Adhesive [Tape] Rash    blistered  . Celecoxib Rash    Past  Medical History  Diagnosis Date  . GERD (gastroesophageal reflux disease)   . Hyperlipidemia   . Hypertension   . Diabetes mellitus   . Carotid artery disease   . Cellulitis and abscess of leg, except foot   . Leg edema   . Right knee sprain   . Cramps, muscle, general   . Low back pain   . Hiatal hernia   . Chills   . Hearing loss   . Nasal congestion   . Cough   . Wheezing   . Generalized headaches   . CAD (coronary artery disease) 11/05/1996    a. CABG x6 by Dr Redmond Pulling - LIMA to Diag+LAD, SVG to OM1+OM2, SVG to PDA+RPL, b. VG->RCA known to be occluded;  c. 05/2012 PCI/Rota/BMS to m/dRCA w/ 3.5x15 Vision BMS x 2 post-dil to 3.75.  Marland Kitchen Mitral stenosis     a. moderate by echo 04/2012.  Marland Kitchen Chronic diastolic congestive heart failure     a. 04/2012 Echo: EF 55-60-%  . Aortic stenosis     a. 04/2012 s/p baloon valvuloplasty w/ reduction in gradient from 47 to 27.    . Pulmonary hypertension     a. 05/2012 RH cath: PA 97/32(54)  . Venous insufficiency (chronic) (peripheral)   . Sleep apnea     no cpap  sleep study >5 yrs  . Shortness of breath   . Anxiety   . Arthritis   . 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 Ett cardiolite  12/31/2006    Mild-Mod distal inf/apical ischemia  . Open heart surgery    . Acute or chronic diastolic hf  1/5/ - 0/07/6977    RLE cellulitis - HOSP  . Doppler echocardiography  10/11/2008    Mild AS, Mild-Mod MS, Severe LVH  . Le arterial and venous US  10/11/2008    Art clear.  Venous Neg DVT  . Appendectomy  12/25/2010    Dr Redmond Pulling  . Umbilical hernia repair  12/25/2010    Dr Redmond Pulling  . Hernia repair  48/01/65    umbilical hernia repair   . Tee without cardioversion  03/20/2012    Procedure: TRANSESOPHAGEAL ECHOCARDIOGRAM (TEE);  Surgeon: [name], MD;  Location: [facility];  Service: Cardiovascular;  Laterality: N/A;  . Cardiac catheterization    . Coronary artery bypass graft    . Transcatheter aortic valve replacement, transfemoral N/A 06/16/2013    Procedure: TRANSCATHETER AORTIC VALVE REPLACEMENT, TRANSFEMORAL;  Surgeon: [name], MD;  Location: [place];  Service: Open Heart Surgery;  Laterality: N/A;  . Intraoperative transesophageal echocardiogram N/A 06/16/2013    Procedure: INTRAOPERATIVE TRANSESOPHAGEAL ECHOCARDIOGRAM;  Surgeon: [name], MD;  [facility] OR;  Service: Open Heart Surgery;  Laterality: N/A;  . Percutaneous coronary rotoblator intervention (pci-r) N/A 05/07/2012    Procedure: PERCUTANEOUS CORONARY ROTOBLATOR INTERVENTION (PCI-R);  Surgeon: [name], MD;  Location: [facility] CATH LAB;  Service: Cardiovascular;  Laterality: N/A;  . Left and right heart catheterization with coronary angiogram N/A 03/20/2013     Procedure: LEFT AND RIGHT HEART CATHETERIZATION WITH CORONARY ANGIOGRAM;  Surgeon: Blane Ohara, MD;  Location: [facility] CATH LAB;  Service: Cardiovascular;  Laterality: N/A;    Family History  Problem Relation Age of Onset  . Diabetes Other     History   Social History  . Marital Status:  Married    Spouse Name: N/A    Number of Children: N/A  . Years of Education: N/A   Occupational History  . Not on file.   Social History Main Topics  . Smoking status: Never Smoker   . Smokeless tobacco: Never Used  . Alcohol Use: No  . Drug Use: No  . Sexual Activity: Not on file   Other Topics Concern  . Not on file   Social History Narrative   The PMH, PSH, Social History, Family History, Medications, and allergies have been reviewed in [facility], and have been updated if relevant.   Review of Systems  Constitutional: Negative.   Neurological: Negative.   Hematological: Negative.   All other systems reviewed and are negative.      Objective:    BP 110/52 mmHg  Pulse 85  Temp(Src) 97.4 F (36.3 C) (Oral)  Wt 229 lb (103.874 kg)  SpO2 91%   Physical Exam  Constitutional: He is oriented to person, place, and time.  HENT:  Head: Normocephalic.  Eyes: Conjunctivae are normal.  Cardiovascular: Normal rate.   Pulmonary/Chest: Effort normal.  Musculoskeletal:       Right shoulder: He exhibits decreased range of motion, tenderness, bony tenderness and pain.  Will not move right arm- guarding it- I can move it passively but he stops me before I can move his arm much at all. Entire shoulder TTP, more posteriorly than anteriorly  Neurological: He is alert and oriented to person, place, and time.  Skin: Skin is warm and dry.  Psychiatric: He has a normal mood and affect. His behavior is normal. Judgment normal.  Nursing note and vitals reviewed.         Assessment & Plan:   Right shoulder injury, initial encounter - Plan: DG Shoulder Right, Ambulatory referral to Orthopedic  Surgery No Follow-up on file.

## 2014-04-07 NOTE — Assessment & Plan Note (Signed)
New- may also be involving his right humerus. Very guarded due to pain- not allowing manipulation. Will order stat xray and stat referral to ortho as he will likely need ortho evaluation and tx. The patient indicates understanding of these issues and agrees with the plan.

## 2014-04-07 NOTE — Patient Instructions (Signed)
Good to see you. Please stop by to see [name] [name] on your way out. 

## 2014-04-07 NOTE — Progress Notes (Signed)
Pre visit review using our clinic review tool, if applicable. No additional management support is needed unless otherwise documented below in the visit note. 

## 2014-04-12 ENCOUNTER — Ambulatory Visit: Payer: Medicare Other | Admitting: Cardiovascular Disease

## 2014-04-26 ENCOUNTER — Other Ambulatory Visit: Payer: Self-pay | Admitting: Cardiovascular Disease

## 2014-04-27 ENCOUNTER — Telehealth: Payer: Self-pay

## 2014-04-27 MED ORDER — TRAMADOL HCL 50 MG PO TABS: 50.0000 mg | ORAL_TABLET | Freq: Two times a day (BID) | ORAL | Status: DC | PRN

## 2014-04-27 NOTE — Telephone Encounter (Signed)
Please call tramadol in once and then have the f/u come through ortho.  I didn't want him to run out in the meantime.  Max 4 pills per day.  Thanks.

## 2014-04-27 NOTE — Telephone Encounter (Signed)
Medication phoned to pharmacy.  

## 2014-04-27 NOTE — Telephone Encounter (Signed)
Pt left note; pt fell and broke shoulder 4 weeks ago; pt being seen at [facility] ortho; pt has been taking tramadol for shoulder pain; pt has been taking tramadol more often than prescribed and is out of tramadol. Raliegh Ip gave pt oxycodone for pain and pt is taking that but prefers to take tramadol. Advised pt should contact Raliegh Ip for pain mgt of shoulder problem but pt request note sent to Dr Damita Dunnings to see if he will refill tramadol to [place]. Pt request cb.

## 2014-04-29 ENCOUNTER — Encounter: Payer: Self-pay | Admitting: Family Medicine

## 2014-04-29 DIAGNOSIS — S42201A Unspecified fracture of upper end of right humerus, initial encounter for closed fracture: Secondary | ICD-10-CM | POA: Insufficient documentation

## 2014-05-06 ENCOUNTER — Other Ambulatory Visit: Payer: Self-pay | Admitting: Cardiovascular Disease

## 2014-06-14 ENCOUNTER — Encounter: Payer: Self-pay | Admitting: Cardiovascular Disease

## 2014-06-14 ENCOUNTER — Ambulatory Visit (INDEPENDENT_AMBULATORY_CARE_PROVIDER_SITE_OTHER): Payer: Medicare Other | Admitting: Cardiovascular Disease

## 2014-06-14 ENCOUNTER — Ambulatory Visit: Payer: Medicare Other | Admitting: Cardiovascular Disease

## 2014-06-14 ENCOUNTER — Ambulatory Visit (HOSPITAL_COMMUNITY): Payer: Medicare Other | Attending: Cardiology | Admitting: Cardiology

## 2014-06-14 VITALS — BP 110/58 | HR 64 | Ht 70.5 in | Wt 228.8 lb

## 2014-06-14 DIAGNOSIS — I5033 Acute on chronic diastolic (congestive) heart failure: Secondary | ICD-10-CM

## 2014-06-14 DIAGNOSIS — Z954 Presence of other heart-valve replacement: Secondary | ICD-10-CM | POA: Diagnosis not present

## 2014-06-14 DIAGNOSIS — Z952 Presence of prosthetic heart valve: Secondary | ICD-10-CM

## 2014-06-14 DIAGNOSIS — I080 Rheumatic disorders of both mitral and aortic valves: Secondary | ICD-10-CM

## 2014-06-14 DIAGNOSIS — I350 Nonrheumatic aortic (valve) stenosis: Secondary | ICD-10-CM

## 2014-06-14 DIAGNOSIS — R0600 Dyspnea, unspecified: Secondary | ICD-10-CM

## 2014-06-14 LAB — BASIC METABOLIC PANEL
BUN: 36 mg/dL — AB (ref 6–23)
CHLORIDE: 96 meq/L (ref 96–112)
CO2: 31 meq/L (ref 19–32)
CREATININE: 1.61 mg/dL — AB (ref 0.40–1.50)
Calcium: 9.7 mg/dL (ref 8.4–10.5)
GFR: 45.09 mL/min — AB (ref >60.00)
Glucose, Bld: 241 mg/dL — ABNORMAL HIGH (ref 70–99)
Potassium: 4 mEq/L (ref 3.5–5.1)
Sodium: 134 mEq/L — ABNORMAL LOW (ref 135–145)

## 2014-06-14 LAB — BRAIN NATRIURETIC PEPTIDE: Pro B Natriuretic peptide (BNP): 608 pg/mL — ABNORMAL HIGH (ref 0.0–100.0)

## 2014-06-14 MED ORDER — METOLAZONE 2.5 MG PO TABS: ORAL_TABLET | ORAL | Status: DC

## 2014-06-14 MED ORDER — CLOPIDOGREL BISULFATE 75 MG PO TABS: 75.0000 mg | ORAL_TABLET | Freq: Every day | ORAL | Status: DC

## 2014-06-14 MED ORDER — PANTOPRAZOLE SODIUM 40 MG PO TBEC: 40.0000 mg | DELAYED_RELEASE_TABLET | Freq: Every morning | ORAL | Status: DC

## 2014-06-14 MED ORDER — EZETIMIBE 10 MG PO TABS: ORAL_TABLET | ORAL | Status: DC

## 2014-06-14 NOTE — Patient Instructions (Addendum)
Medication Instructions:  Please START Metolazone 2.5mg  take one tablet by mouth 30 minutes prior to your morning dose of Furosemide on Monday morning  Labwork: Your physician recommends that you have lab work today: BMP and BNP  Testing/Procedures: NONE  Follow-Up: Your physician recommends that you schedule a follow-up appointment in: 4 MONTHS with Dr [name]  Any Other Special Instructions Will Be Listed Below (If Applicable).

## 2014-06-14 NOTE — Progress Notes (Signed)
Echo performed. 

## 2014-06-14 NOTE — Progress Notes (Signed)
Cardiology Office Note   Date:  06/15/2014   ID:  [name] [name]., DOB 1940/10/29, MRN [id number]  PCP:  [name], MD  Cardiologist:  [name], MD    Chief Complaint  Patient presents with  . Appointment    ROV     History of Present Illness: [name] [name]. is a 74 y.o. male who presents for follow-up of coronary and rheumatic heart disease.   He underwent 6 vessel CABG in 1998. He's undergone multiple PCI procedures, most recently with rotational atherectomy and bare-metal stenting of the native right coronary artery in 2014. 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 He had an uncomplicated postoperative course. Other comorbid medical conditions include longstanding Type 2 diabetes, stage 3 CKD, and chronic diastolic heart failure.  Since his last evaluation here in January, he had a fall and injured his right shoulder. He is going through physical therapy and ROM is improving. Chronic exertional dyspnea is unchanged, but he has had more frequent episodes of 'flare-ups' requiring metolazone. He continues on furosemide 160 mg in the am and 80 mg in the afternoon. No orthopnea or PND. He has developed anxiety at night and has had problems with insomnia. Denies chest pain or pressure. Occasional lightheadedness but no frank syncope. Chronic leg swelling is unchanged.    Past Medical History  Diagnosis Date  . GERD (gastroesophageal reflux disease)   . Hyperlipidemia   . Hypertension   . Diabetes mellitus   . Carotid artery disease   . Cellulitis and abscess of leg, except foot   . Leg edema   . Right knee sprain   . Cramps, muscle, general   . Low back pain   . Hiatal hernia   . Chills   . Hearing loss   . Nasal congestion   . Cough   . Wheezing   . Generalized headaches   . CAD (coronary artery disease) 11/05/1996    a. CABG x6 by Dr Redmond Pulling - LIMA to Diag+LAD, SVG to OM1+OM2, SVG to PDA+RPL, b. VG->RCA known to be occluded;  c. 05/2012 PCI/Rota/BMS to m/dRCA w/ 3.5x15 Vision BMS x 2 post-dil to 3.75.  Marland Kitchen Mitral stenosis     a. moderate by echo 04/2012.  Marland Kitchen Chronic diastolic congestive heart failure     a. 04/2012 Echo: EF 55-60-%  . Aortic stenosis     a. 04/2012 s/p baloon valvuloplasty w/ reduction in gradient from 47 to 27.  . Pulmonary hypertension     a. 05/2012 RH cath: PA 97/32(54)  . Venous insufficiency (chronic) (peripheral)   . Sleep apnea     no cpap  sleep study >5 yrs  . Shortness of breath   . Anxiety   . Arthritis   . 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 Fracture of humerus, proximal, right, closed 2016    Past Surgical History  Procedure Laterality Date  . Ett cardiolite  12/31/2006    Mild-Mod distal inf/apical ischemia  . Open heart surgery    . Acute or chronic diastolic hf  3/0/ - 10/09/5782    RLE cellulitis - HOSP  . Doppler echocardiography  10/11/2008    Mild AS, Mild-Mod MS, Severe LVH  . Le arterial and venous US  10/11/2008  Art clear.  Venous Neg DVT  . Appendectomy  12/25/2010    Dr Redmond Pulling  . Umbilical hernia repair  12/25/2010    Dr Redmond Pulling  . Hernia repair  32/99/24    umbilical hernia repair   . Tee without cardioversion  03/20/2012    Procedure: TRANSESOPHAGEAL ECHOCARDIOGRAM (TEE);  Surgeon: [name], MD;  Location: [place];  Service: Cardiovascular;  Laterality: N/A;  . Cardiac catheterization    . Coronary artery bypass graft    . Transcatheter aortic valve replacement, transfemoral N/A 06/16/2013    Procedure: TRANSCATHETER AORTIC VALVE REPLACEMENT, TRANSFEMORAL;  Surgeon: [name], MD;  Location: [place];  Service: Open Heart Surgery;  Laterality: N/A;  . Intraoperative transesophageal echocardiogram N/A 06/16/2013     Procedure: INTRAOPERATIVE TRANSESOPHAGEAL ECHOCARDIOGRAM;  Surgeon: [name], MD;  Location: [facility] OR;  Service: Open Heart Surgery;  Laterality: N/A;  . Percutaneous coronary rotoblator intervention (pci-r) N/A 05/07/2012    Procedure: PERCUTANEOUS CORONARY ROTOBLATOR INTERVENTION (PCI-R);  Surgeon: [name], MD;  Location: [facility] CATH LAB;  Service: Cardiovascular;  Laterality: N/A;  . Left and right heart catheterization with coronary angiogram N/A 03/20/2013    Procedure: LEFT AND RIGHT HEART CATHETERIZATION WITH CORONARY ANGIOGRAM;  Surgeon: Blane Ohara, MD;  Location: [facility] CATH LAB;  Service: Cardiovascular;  Laterality: N/A;    Current Outpatient Prescriptions  Medication Sig Dispense Refill  . ALPRAZolam (XANAX) 0.25 MG tablet Take 1 tablet (0.25 mg total) by mouth 2 (two) times daily as needed for anxiety. 10 tablet 0  . aspirin EC 81 MG tablet Take 81 mg by mouth daily.    . clopidogrel (PLAVIX) 75 MG tablet Take 1 tablet (75 mg total) by mouth daily with breakfast. 30 tablet 11  . Coenzyme Q10 300 MG CAPS Take 300 mg by mouth daily.     Marland Kitchen ezetimibe (ZETIA) 10 MG tablet TAKE ONE TABLET BY MOUTH EACH EVENING 30 tablet 11  . fish oil-omega-3 fatty acids 1000 MG capsule Take 1 g by mouth 2 (two) times daily.     . furosemide (LASIX) 80 MG tablet TAKE 2 TABLETS BY MOUTH IN THE MORNING AND 1 TABLET IN THE EVENING 90 tablet 11  . insulin glargine (LANTUS) 100 UNIT/ML injection Per endocrine clinic    . Insulin Zinc Human (NOVOLIN L [place]) Inject into the skin 3 (three) times daily. SLIDING SCALE    . JANUVIA 100 MG tablet Take 100 mg by mouth at bedtime.     . methocarbamol (ROBAXIN) 500 MG tablet Take 500 mg by mouth daily as needed (leg cramps).    . metolazone (ZAROXOLYN) 2.5 MG tablet Take one tablet by mouth on Monday morning 30 minutes prior to your Furosemide dosage, also use as needed for swelling 30 tablet 2  . nitroGLYCERIN (NITROSTAT) 0.4 MG SL tablet Place 1 tablet (0.4 mg  total) under the tongue every 5 (five) minutes as needed for chest pain. May repeat up to 3 times as needed 30 tablet 5  . pantoprazole (PROTONIX) 40 MG tablet Take 1 tablet (40 mg total) by mouth every morning. 30 tablet 11  . potassium chloride SA (K-DUR,KLOR-CON) 20 MEQ tablet TAKE TWO (2) TABLETS BY MOUTH DAILY 60 tablet 5  . Powders (ANTI MONKEY BUTT) POWD Apply 1 application topically as needed (burning itching drying up). Apply to legs and rub in    . ranitidine (ZANTAC) 150 MG tablet Take 150 mg by mouth 2 (two) times daily.     Marland Kitchen  repaglinide (PRANDIN) 2 MG tablet Take 2 mg by mouth as directed. Sliding Scale    . rosuvastatin (CRESTOR) 20 MG tablet Take 1 tablet (20 mg total) by mouth at bedtime. 30 tablet 6  . sertraline (ZOLOFT) 50 MG tablet Take 1 tablet (50 mg total) by mouth daily. 30 tablet 5  . traMADol (ULTRAM) 50 MG tablet Take 1-2 tablets (50-100 mg total) by mouth every 12 (twelve) hours as needed for severe pain (max 4 pills per day). 60 tablet 0  . Vitamin D, Ergocalciferol, (DRISDOL) 50000 UNITS CAPS Take 50,000 Units by mouth every 7 (seven) days. Take every week on saturday    . [DISCONTINUED] promethazine (PHENERGAN) 25 MG tablet Take 1/2 to 1 tablet up to every 6 hours as needed for nausea.      No current facility-administered medications for this visit.    Allergies:   Adhesive and Celecoxib   Social History:  The patient  reports that he has never smoked. He has never used smokeless tobacco. He reports that he does not drink alcohol or use illicit drugs.   Family History:  The patient's  family history includes Diabetes in his other.    ROS:  Please see the history of present illness.  Otherwise, review of systems is positive for leg swelling, hearing loss, DOE, muscle pain, leg pain, anxiety.  All other systems are reviewed and negative.    PHYSICAL EXAM: VS:  BP 110/58 mmHg  Pulse 64  Ht 5' 10.5" (1.791 m)  Wt 228 lb 12.8 oz (103.783 kg)  BMI 32.35 kg/m2  , BMI Body mass index is 32.35 kg/(m^2). GEN: Well nourished, well developed, in no acute distress HEENT: normal Neck: no JVD, no masses. No carotid bruits Cardiac: RRR with grade 2/6 diastolic murmur at the LLSB             Respiratory:  clear to auscultation bilaterally, normal work of breathing GI: soft, nontender, nondistended, + BS MS: no deformity or atrophy Ext: both lower legs wrapped in ACE bandages Neuro:  Strength and sensation are intact Psych: euthymic mood, full affect  EKG:  EKG is not ordered today.  Recent Labs: 06/17/2013: Magnesium 2.5 06/19/2013: Hemoglobin 10.3*; Platelets 190 06/14/2014: BUN 36*; Creatinine 1.61*; Potassium 4.0; Pro B Natriuretic peptide (BNP) 608.0*; Sodium 134*   Lipid Panel  No results found for: CHOL, TRIG, HDL, CHOLHDL, VLDL, LDLCALC, LDLDIRECT    Wt Readings from Last 3 Encounters:  06/14/14 228 lb 12.8 oz (103.783 kg)  04/07/14 229 lb (103.874 kg)  03/30/14 225 lb 12.8 oz (102.422 kg)     Cardiac Studies Reviewed: 2D Echo: Study Conclusions  - Left ventricle: The cavity size was normal. Wall thickness was increased in a pattern of moderate LVH. Systolic function was normal. The estimated ejection fraction was in the range of 55% to 60%. There is hypokinesis of the mid-apicalanteroseptal myocardium. - Ventricular septum: The contour showed diastolic flattening and systolic flattening. - Aortic valve: A bioprosthesis was present. There was mild stenosis. There was mild regurgitation. - Aortic root: The aortic root was mildly dilated. - Mitral valve: Severely calcified annulus. Mobility was restricted. The findings are consistent with severe stenosis. - Left atrium: The atrium was severely dilated. - Right ventricle: The cavity size was mildly dilated. Hypertrophy was present. Systolic function was moderately reduced. - Right atrium: The atrium was moderately dilated. - Pulmonary arteries: Systolic pressure was  mildly increased. PA peak pressure: 40 mm Hg (S).  Impressions:  -  Hypokinesis of the distal septum with overall preserved LV function; biatrial enlargement; mild RVE with moderately reduced function; s/p TAVR (mean gradient 15 mmHg); mild AI; severe MAC with severe MS (mean gradient 18 mmHg); findings of RV dysfunction, flattened ventricular septum suggest pulmonary hypertension; compared to 07/17/13, mean gradient across MV has increased from 14 to 18 mmHg.  ASSESSMENT AND PLAN: 1.  Chronic diastolic CHF, NYHA Functional Class 3. While he remains significantly limited, symptoms are still much better since undergoing TAVR. Pro-BNP is 608 (previous 7270). Med Rx reviewed and will add schedule metolazone 2.5 mg once/week before his am furosemide dose. Can increase as needed based on symptoms and sliding scale weights.   2. Valvular heart disease. Today's echo reviewed. He is one year out from TAVR with normal function of his aortic bioprosthesis. Mitral stenosis is severe now with mean gradient of 18 mmHg. I will send his echo films to Derinda Sis at [facility] but I am almost certain he is NOT a candidate for Balloon Mitral Valvuloplasty because of severe MAC/extensive calcification and thickening of the valve. Limited options in this patient and he understands this. Echo has characteristics of RV pressure and volume overload.   3. CAD - no angina. Extensive small vessel disease. No angina. Continue long-term DAPT.  4. CKD, Stage 3. Renal function stable based on today's labs. He has been unable to tolerate an ACE-Inhibitor.  4. Type 2 DM.  He is concerned about cost of insulin. Advised to address with Dr Altheimer.    Current medicines are reviewed with the patient today.  The patient does not have concerns regarding medicines.  The following changes have been made:  Add metolazone 2.5 mg weekly  Labs/ tests ordered today include:   Orders Placed This Encounter  Procedures  .  Basic Metabolic Panel (BMET)  . B Nat Peptide  . EKG 12-Lead    Disposition:   FU 4 months  Signed, [name], MD  06/15/2014 5:59 AM    [facility] [address], [place], [place]  [address] Phone: [phone]; Fax: [phone]

## 2014-06-16 ENCOUNTER — Encounter: Payer: Self-pay | Admitting: Family Medicine

## 2014-06-16 ENCOUNTER — Ambulatory Visit (INDEPENDENT_AMBULATORY_CARE_PROVIDER_SITE_OTHER): Payer: Medicare Other | Admitting: Family Medicine

## 2014-06-16 VITALS — BP 128/64 | HR 92 | Temp 97.7°F | Wt 228.0 lb

## 2014-06-16 DIAGNOSIS — G4700 Insomnia, unspecified: Secondary | ICD-10-CM | POA: Diagnosis not present

## 2014-06-16 DIAGNOSIS — K5900 Constipation, unspecified: Secondary | ICD-10-CM

## 2014-06-16 MED ORDER — SERTRALINE HCL 100 MG PO TABS: 100.0000 mg | ORAL_TABLET | Freq: Every day | ORAL | Status: DC

## 2014-06-16 MED ORDER — POLYETHYLENE GLYCOL 3350 17 GM/SCOOP PO POWD: 17.0000 g | Freq: Every day | ORAL | Status: AC | PRN

## 2014-06-16 NOTE — Progress Notes (Signed)
Pre visit review using our clinic review tool, if applicable. No additional management support is needed unless otherwise documented below in the visit note.  Insomnia, just not able to get to sleep at night.  Tried tramadol at night.  Tylenol PM didn't help.  He admits that he gets anxious about his situation, ie his worsening CHF.  He didn't know if the CHF led to more abd bloating that was to his BM changes/constipation.  He has tried stool softeners.  He has tried citrate of magnesia.  He noted that he sleeps better after a good PM BM.   Meds, vitals, and allergies reviewed.   ROS: See HPI.  Otherwise, noncontributory.  nad ncat Mmm Neck supple, no LA rrr ctab abd soft, not ttp Ext with trace edema, in compression wraps.

## 2014-06-16 NOTE — Patient Instructions (Signed)
Increase the sertraline to 100mg  a day and see if that helps with your anxiety and insomnia.   Try using miralax daily to get/keep your bowels moving.   You may need to adjust the dose (ie change to MWF) depending on your response to the medicine.  Let me know if that isn't helping.  Take care.  Glad to see you.

## 2014-06-17 DIAGNOSIS — K5900 Constipation, unspecified: Secondary | ICD-10-CM | POA: Insufficient documentation

## 2014-06-17 DIAGNOSIS — G4700 Insomnia, unspecified: Secondary | ICD-10-CM | POA: Insufficient documentation

## 2014-06-17 NOTE — Assessment & Plan Note (Signed)
Would add on miralax and he'll titrate dose as needed.  He agrees. Okay for outpatient fu.

## 2014-06-17 NOTE — Assessment & Plan Note (Signed)
Likely anxiety related, he agrees. Potentially influenced by constipation.  Treat for both.  Inc sertraline to 100mg  and he'll update me as needed. Routine timeline d/w pt.  Still okay for outpatient f/u.  He agrees.

## 2014-06-29 ENCOUNTER — Other Ambulatory Visit: Payer: Self-pay | Admitting: Cardiovascular Disease

## 2014-07-07 ENCOUNTER — Encounter: Payer: Self-pay | Admitting: Primary Care

## 2014-07-07 ENCOUNTER — Ambulatory Visit (INDEPENDENT_AMBULATORY_CARE_PROVIDER_SITE_OTHER): Payer: Medicare Other | Admitting: Primary Care

## 2014-07-07 VITALS — BP 126/60 | HR 64 | Temp 98.0°F | Ht 70.5 in | Wt 229.8 lb

## 2014-07-07 DIAGNOSIS — R252 Cramp and spasm: Secondary | ICD-10-CM | POA: Diagnosis not present

## 2014-07-07 MED ORDER — TRAMADOL HCL 50 MG PO TABS: 50.0000 mg | ORAL_TABLET | Freq: Two times a day (BID) | ORAL | Status: DC | PRN

## 2014-07-07 MED ORDER — METHOCARBAMOL 500 MG PO TABS: 500.0000 mg | ORAL_TABLET | Freq: Every day | ORAL | Status: DC | PRN

## 2014-07-07 NOTE — Progress Notes (Signed)
Pre visit review using our clinic review tool, if applicable. No additional management support is needed unless otherwise documented below in the visit note. 

## 2014-07-07 NOTE — Assessment & Plan Note (Signed)
Chronic. More intense since the initiation of Zaroxolyn.  Treated with methocarbamol, needs refill.  Provided refill for methocarbamol and Tramadol. Encouraged patient to stay hydrated to help with reduction in cramping. Declines BMP today.

## 2014-07-07 NOTE — Patient Instructions (Signed)
Refills have been provided for your methocarbamol and Tramadol for your leg cramps. Increase your intake of water during muscle cramps and continue to take your potassium as directed.  Leg Cramps Leg cramps that occur during exercise can be caused by poor circulation or dehydration. However, muscle cramps that occur at rest or during the night are usually not due to any serious medical problem. Heat cramps may cause muscle spasms during hot weather.  CAUSES There is no clear cause for muscle cramps. However, dehydration may be a factor for those who do not drink enough fluids and those who exercise in the heat. Imbalances in the level of sodium, potassium, calcium or magnesium in the muscle tissue may also be a factor. Some medications, such as water pills (diuretics), may cause loss of chemicals that the body needs (like sodium and potassium) and cause muscle cramps. TREATMENT   Make sure your diet has enough fluids and essential minerals for the muscle to work normally.  Avoid strenuous exercise for several days if you have been having frequent leg cramps.  Stretch and massage the cramped muscle for several minutes.  Some medicines may be helpful in some patients with night cramps. Only take over-the-counter or prescription medicines as directed by your caregiver. SEEK IMMEDIATE MEDICAL CARE IF:   Your leg cramps become worse.  Your foot becomes cold, numb, or blue. Document Released: 03/29/2004 Document Revised: 05/14/2011 Document Reviewed: 03/16/2008 [facility] Patient Information 2015 [place], [place]. This information is not intended to replace advice given to you by your health care provider. Make sure you discuss any questions you have with your health care provider.

## 2014-07-07 NOTE — Progress Notes (Signed)
Subjective:    Patient ID: [name] [name]., male    DOB: 1940/07/19, 74 y.o.   MRN: [id number]  HPI  [name] is a 74 year old male who presents today requesting a refill of his methocarbamol and Norco. He reports history of leg cramps that have been present since the introduction of Zaroxolyn one month ago. He takes lasix 160 mg in the morning and 80 mg in the evening for shortness of breath and was started on Zaroxolyn 2.5 mg one month ago and will take it Monday mornings. His leg cramps will develop the evening after taking his Zaroxolyn and sometimes the next day. He will take methocarbamol and hydrocodone 3/325 PRN and potassium 40 meq daily for management of his leg cramps. He has been out of his methocarbamol for several weeks and had more intense cramps over the past 2 nights. Last night he took an additional potassium pill for a total of 60 meq of potassium. He reports the methocarbamol and Norco help his pain the most.   Review of Systems  Respiratory: Negative for shortness of breath.   Cardiovascular: Negative for chest pain.  Musculoskeletal: Positive for myalgias.  Neurological: Negative for dizziness and headaches.       Past Medical History  Diagnosis Date  . GERD (gastroesophageal reflux disease)   . Hyperlipidemia   . Hypertension   . Diabetes mellitus   . Carotid artery disease   . Cellulitis and abscess of leg, except foot   . Leg edema   . Right knee sprain   . Cramps, muscle, general   . Low back pain   . Hiatal hernia   . Chills   . Hearing loss   . Nasal congestion   . Cough   . Wheezing   . Generalized headaches   . CAD (coronary artery disease) 11/05/1996    a. CABG x6 by Dr Redmond Pulling - LIMA to Diag+LAD, SVG to OM1+OM2, SVG to PDA+RPL, b. VG->RCA known to be occluded;  c. 05/2012 PCI/Rota/BMS to m/dRCA w/ 3.5x15 Vision BMS x 2 post-dil to 3.75.  Marland Kitchen Mitral stenosis     a. moderate by echo 04/2012.  Marland Kitchen Chronic diastolic congestive heart failure     a.  04/2012 Echo: EF 55-60-%  . Aortic stenosis     a. 04/2012 s/p baloon valvuloplasty w/ reduction in gradient from 47 to 27.  . Pulmonary hypertension     a. 05/2012 RH cath: PA 97/32(54)  . Venous insufficiency (chronic) (peripheral)   . Sleep apnea     no cpap  sleep study >5 yrs  . Shortness of breath   . Anxiety   . Arthritis   . 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 Fracture of humerus, proximal, right, closed 2016    History   Social History  . Marital Status: Married    Spouse Name: N/A  . Number of Children: N/A  . Years of Education: N/A   Occupational History  . Not on file.   Social History Main Topics  . Smoking status: Never Smoker   . Smokeless tobacco: Never Used  . Alcohol Use: No  . Drug Use: No  . Sexual Activity: Not on file   Other Topics Concern  . Not on file   Social History Narrative    Past Surgical History  Procedure Laterality Date  . Ett cardiolite  12/31/2006    Mild-Mod distal inf/apical ischemia  . Open heart surgery    . Acute or chronic diastolic hf  8/6/ - 09/08/7207    RLE cellulitis - HOSP  . Doppler echocardiography  10/11/2008    Mild AS, Mild-Mod MS, Severe LVH  . Le arterial and venous US  10/11/2008    Art clear.  Venous Neg DVT  . Appendectomy  12/25/2010    Dr Redmond Pulling  . Umbilical hernia repair  12/25/2010    Dr Redmond Pulling  . Hernia repair  47/09/62    umbilical hernia repair   . Tee without cardioversion  03/20/2012    Procedure: TRANSESOPHAGEAL ECHOCARDIOGRAM (TEE);  Surgeon: [name], MD;  Location: [place];  Service: Cardiovascular;  Laterality: N/A;  . Cardiac catheterization    . Coronary artery bypass graft    . Transcatheter aortic valve replacement, transfemoral N/A 06/16/2013    Procedure: TRANSCATHETER AORTIC VALVE REPLACEMENT, TRANSFEMORAL;  Surgeon: [name], MD;  Location: [place];   Service: Open Heart Surgery;  Laterality: N/A;  . Intraoperative transesophageal echocardiogram N/A 06/16/2013    Procedure: INTRAOPERATIVE TRANSESOPHAGEAL ECHOCARDIOGRAM;  Surgeon: [name], MD;  Location[facility];  Service: Open Heart Surgery;  Laterality: N/A;  . Percutaneous coronary rotoblator intervention (pci-r) N/A 05/07/2012    Procedure: PERCUTANEOUS CORONARY ROTOBLATOR INTERVENTION (PCI-R);  Surgeon: [name], MD;  Location: [facility] CATH LAB;  Service: Cardiovascular;  Laterality: N/A;  . Left and right heart catheterization with coronary angiogram N/A 03/20/2013    Procedure: LEFT AND RIGHT HEART CATHETERIZATION WITH CORONARY ANGIOGRAM;  Surgeon: Blane Ohara, MD;  Location: [facility] CATH LAB;  Service: Cardiovascular;  Laterality: N/A;    Family History  Problem Relation Age of Onset  . Diabetes Other     Allergies  Allergen Reactions  . Adhesive [Tape] Rash    blistered  . Celecoxib Rash    Current Outpatient Prescriptions on File Prior to Visit  Medication Sig Dispense Refill  . ALPRAZolam (XANAX) 0.25 MG tablet Take 1 tablet (0.25 mg total) by mouth 2 (two) times daily as needed for anxiety. 10 tablet 0  . aspirin EC 81 MG tablet Take 81 mg by mouth daily.    . clopidogrel (PLAVIX) 75 MG tablet Take 1 tablet (75 mg total) by mouth daily with breakfast. 30 tablet 11  . Coenzyme Q10 300 MG CAPS Take 300 mg by mouth daily.     Marland Kitchen ezetimibe (ZETIA) 10 MG tablet TAKE ONE TABLET BY MOUTH EACH EVENING 30 tablet 11  . fish oil-omega-3 fatty acids 1000 MG capsule Take 1 g by mouth 2 (two) times daily.     . furosemide (LASIX) 80 MG tablet TAKE 2 TABLETS BY MOUTH IN THE MORNING AND 1 TABLET IN THE EVENING 90 tablet 11  . insulin glargine (LANTUS) 100 UNIT/ML injection Per endocrine clinic    . Insulin Zinc Human (NOVOLIN L ) Inject into the skin 3 (three) times daily. SLIDING SCALE    . JANUVIA 100 MG tablet Take 100 mg by mouth at bedtime.     . metolazone (ZAROXOLYN) 2.5 MG  tablet Take one tablet by mouth on Monday morning 30 minutes prior to your Furosemide dosage, also use as needed for swelling 30 tablet 2  . nitroGLYCERIN (NITROSTAT) 0.4 MG SL tablet Place 1 tablet (0.4 mg total) under the tongue every 5 (five) minutes as needed for chest pain. May repeat up to 3 times as needed 30 tablet 5  . pantoprazole (  PROTONIX) 40 MG tablet Take 1 tablet (40 mg total) by mouth every morning. 30 tablet 11  . polyethylene glycol powder (GLYCOLAX/MIRALAX) powder Take 17 g by mouth daily as needed (for constipation). 850 g 1  . potassium chloride SA (K-DUR,KLOR-CON) 20 MEQ tablet TAKE TWO (2) TABLETS BY MOUTH DAILY 60 tablet 5  . Powders (ANTI MONKEY BUTT) POWD Apply 1 application topically as needed (burning itching drying up). Apply to legs and rub in    . ranitidine (ZANTAC) 150 MG tablet Take 150 mg by mouth 2 (two) times daily.     . repaglinide (PRANDIN) 2 MG tablet Take 2 mg by mouth as directed. Sliding Scale    . sertraline (ZOLOFT) 100 MG tablet Take 1 tablet (100 mg total) by mouth daily. 30 tablet 5  . Vitamin D, Ergocalciferol, (DRISDOL) 50000 UNITS CAPS Take 50,000 Units by mouth every 7 (seven) days. Take every week on saturday    . rosuvastatin (CRESTOR) 20 MG tablet Take 1 tablet (20 mg total) by mouth at bedtime. 30 tablet 6  . [DISCONTINUED] promethazine (PHENERGAN) 25 MG tablet Take 1/2 to 1 tablet up to every 6 hours as needed for nausea.      No current facility-administered medications on file prior to visit.    BP 126/60 mmHg  Pulse 64  Temp(Src) 98 F (36.7 C) (Oral)  Ht 5' 10.5" (1.791 m)  Wt 229 lb 12.8 oz (104.237 kg)  BMI 32.50 kg/m2  SpO2 95%    Objective:   Physical Exam  Constitutional: He is oriented to person, place, and time. He appears well-developed.  Cardiovascular: Normal rate and regular rhythm.   Murmur heard. Pulmonary/Chest: Effort normal and breath sounds normal.  Neurological: He is alert and oriented to person, place,  and time.  Skin: Skin is warm and dry.  Psychiatric: He has a normal mood and affect.          Assessment & Plan:

## 2014-07-30 ENCOUNTER — Encounter: Payer: Self-pay | Admitting: Family Medicine

## 2014-07-30 ENCOUNTER — Ambulatory Visit (INDEPENDENT_AMBULATORY_CARE_PROVIDER_SITE_OTHER): Payer: Medicare Other | Admitting: Family Medicine

## 2014-07-30 VITALS — BP 102/68 | HR 72 | Temp 98.3°F | Wt 233.2 lb

## 2014-07-30 DIAGNOSIS — M109 Gout, unspecified: Secondary | ICD-10-CM | POA: Diagnosis not present

## 2014-07-30 DIAGNOSIS — R0602 Shortness of breath: Secondary | ICD-10-CM | POA: Diagnosis not present

## 2014-07-30 DIAGNOSIS — I5032 Chronic diastolic (congestive) heart failure: Secondary | ICD-10-CM

## 2014-07-30 LAB — BASIC METABOLIC PANEL
BUN: 27 mg/dL — ABNORMAL HIGH (ref 6–23)
CALCIUM: 8.9 mg/dL (ref 8.4–10.5)
CO2: 28 mEq/L (ref 19–32)
Chloride: 99 mEq/L (ref 96–112)
Creat: 1.58 mg/dL — ABNORMAL HIGH (ref 0.50–1.35)
Glucose, Bld: 205 mg/dL — ABNORMAL HIGH (ref 70–99)
POTASSIUM: 3.6 meq/L (ref 3.5–5.3)
Sodium: 138 mEq/L (ref 135–145)

## 2014-07-30 LAB — URIC ACID: URIC ACID, SERUM: 9.6 mg/dL — AB (ref 4.0–7.8)

## 2014-07-30 MED ORDER — COLCHICINE 0.6 MG PO TABS: 0.6000 mg | ORAL_TABLET | Freq: Every day | ORAL | Status: AC | PRN

## 2014-07-30 NOTE — Progress Notes (Signed)
Pre visit review using our clinic review tool, if applicable. No additional management support is needed unless otherwise documented below in the visit note.  R 1st IP joint sore and red w/o trauma.  No h/o gout prev but diuretic use noted.  No other foot pain.  Not ttp at the [facility].    More SOB recently, usually returns a few days after metolazone use, sx not happening acutely.  Notes that his BLE edema is increasing more quickly now, a few days after metolazone use.  Has cramping the night after metolazone use.  Asking about options.   Meds, vitals, and allergies reviewed.   ROS: See HPI.  Otherwise, noncontributory.  nad ncat Mmm rrr ctab abd soft 1+ BLE edema R 1st IP red and tender but the 1st MTP isn't sore.  DP intact in the foot.  Not ttp on bony prominences o/w.

## 2014-07-30 NOTE — Patient Instructions (Addendum)
Likely gout.  Go to the lab on the way out.  We'll contact you with your lab report. Take colchicine once a day.  Skip crestor while on colchicine.  Take care.  Glad to see you.

## 2014-07-31 LAB — BRAIN NATRIURETIC PEPTIDE: BRAIN NATRIURETIC PEPTIDE: 587.3 pg/mL — AB (ref 0.0–100.0)

## 2014-08-02 DIAGNOSIS — M109 Gout, unspecified: Secondary | ICD-10-CM | POA: Insufficient documentation

## 2014-08-02 NOTE — Assessment & Plan Note (Signed)
Likely with need for adjustment of diuretic regimen, will ask for card input.  Nontoxic, not emergent as of the eval at [facility].

## 2014-08-02 NOTE — Assessment & Plan Note (Signed)
Likely gout.  See notes on labs.   Take colchicine once a day.  Skip crestor while on colchicine.  D/w pt about gout path/phys.   Likely affected by diuretic use.

## 2014-08-04 ENCOUNTER — Telehealth: Payer: Self-pay

## 2014-08-04 NOTE — Telephone Encounter (Signed)
Done

## 2014-08-04 NOTE — Telephone Encounter (Signed)
Please document under quality metrics tab, patient's new EF 55-60% as of 06/14/14. To meet requirements for CHF quality metrics.

## 2014-08-13 ENCOUNTER — Encounter: Payer: Self-pay | Admitting: *Deleted

## 2014-08-17 ENCOUNTER — Encounter: Payer: Self-pay | Admitting: Physician Assistant

## 2014-08-17 ENCOUNTER — Telehealth: Payer: Self-pay | Admitting: Cardiovascular Disease

## 2014-08-17 ENCOUNTER — Ambulatory Visit (INDEPENDENT_AMBULATORY_CARE_PROVIDER_SITE_OTHER): Payer: Medicare Other | Admitting: Physician Assistant

## 2014-08-17 VITALS — BP 130/56 | HR 75 | Ht 70.5 in | Wt 233.8 lb

## 2014-08-17 DIAGNOSIS — I2510 Atherosclerotic heart disease of native coronary artery without angina pectoris: Secondary | ICD-10-CM

## 2014-08-17 DIAGNOSIS — N183 Chronic kidney disease, stage 3 (moderate): Secondary | ICD-10-CM

## 2014-08-17 DIAGNOSIS — I050 Rheumatic mitral stenosis: Secondary | ICD-10-CM | POA: Diagnosis not present

## 2014-08-17 DIAGNOSIS — I5033 Acute on chronic diastolic (congestive) heart failure: Secondary | ICD-10-CM

## 2014-08-17 DIAGNOSIS — I350 Nonrheumatic aortic (valve) stenosis: Secondary | ICD-10-CM

## 2014-08-17 DIAGNOSIS — Z954 Presence of other heart-valve replacement: Secondary | ICD-10-CM

## 2014-08-17 DIAGNOSIS — I5032 Chronic diastolic (congestive) heart failure: Secondary | ICD-10-CM | POA: Diagnosis not present

## 2014-08-17 DIAGNOSIS — Z952 Presence of prosthetic heart valve: Secondary | ICD-10-CM

## 2014-08-17 MED ORDER — METOLAZONE 2.5 MG PO TABS: ORAL_TABLET | ORAL | Status: DC

## 2014-08-17 MED ORDER — POTASSIUM CHLORIDE CRYS ER 20 MEQ PO TBCR: EXTENDED_RELEASE_TABLET | ORAL | Status: DC

## 2014-08-17 NOTE — Telephone Encounter (Signed)
New Message  Pt wife calling to speak w/ Rn about pt's current condition- per pt wife- "not feeling well"- made pt appt w/ Cecille Rubin in August. Please call back and dsicuss.

## 2014-08-17 NOTE — Patient Instructions (Signed)
Medication Instructions:  1. INCREASE METOLAZONE TO 2.5 MG ON Monday AND Thursday'S ONLY  2. TAKE AN EXTRA POTASSIUM 20 MEQ ON THE DAYS WHEN YOU TAKE THE METOLAZONE  Labwork: LAB WORK IN 2 WEEKS FOR BMET 08/31/14; COME IN ANYTIME FROM 7:30-4:30  Testing/Procedures: NONE  Follow-Up: YOU HAVE A FOLLOW UP WITH DR. [name] ON 10/29/14 @ 10:30   Any Other Special Instructions Will Be Listed Below (If Applicable).

## 2014-08-17 NOTE — Telephone Encounter (Signed)
I spoke with the pt and he states that he is taking his Lasix 2 in the morning and 1 in the afternoon. The pt takes Metolazone once a week on Monday.  The pt continues to have issues with fluid retention and SOB.  The pt's weight fluctuates just a few pounds and he becomes symptomatic. The pt's weight yesterday was 230 and today he is 228. I have scheduled the pt to come into the office today for evaluation.

## 2014-08-17 NOTE — Progress Notes (Signed)
Cardiology Office Note   Date:  08/17/2014   ID:  [name] Pinedo., DOB 1940/12/29, MRN [id number]  PCP:  [name], MD  Cardiologist:  Dr. [name]     Chief Complaint  Patient presents with  . Congestive Heart Failure     History of Present Illness: [name] A Lowry Bala. is a 74 y.o. 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 He continues to suffer with volume excess from diastolic HF in the setting of severe mitral stenosis. Echocardiogram 06/14/14 demonstrated normal LV function, mild aortic stenosis with mean gradient 15 mmHg and worsening gradient across mitral valve (14 >> 18 mmHg-compared with 07/17/13). Dr. [name] has reviewed his previous echocardiogram. He is not felt to be a candidate for balloon mitral valvuloplasty. His diuresis has been augmented with metolazone once weekly. He called in today with worsening edema, dyspnea and weight gain. He was added on for further evaluation.  The patient notes significant improvement in his breathing with initiation of metolazone. However, he starts to have increased weight and worsening shortness of breath 3 days after his dose. He does have some chest tightness associated with this. He denies orthopnea or PND. He denies significant LE edema. He denies syncope. He does have some orthostatic intolerance.   Studies/Reports Reviewed Today:  Echo 06/14/14 - Moderate LVH. EF 55%to 60%. There is hypokinesis of the mid-apical anteroseptalmyocardium. - Ventricular septum: The contour showed diastolic flattening andsystolic flattening. - Aortic valve: A bioprosthesis was present. There was mild stenosis. There was mild regurgitation. - Aortic root: The aortic root was mildly dilated. - Mitral  valve: Severely calcified annulus. Mobility was restricted. The findings are consistent with severe stenosis. - Left atrium: The atrium was severely dilated. - Right ventricle: The cavity size was mildly dilated. Hypertrophy was present. Systolic function was moderately reduced. - Right atrium: The atrium was moderately dilated. - PApeak pressure: 40 mm Hg (S). Impressions:   Hypokinesis of the distal septum with overall preserved LVfunction; biatrial enlargement; mild RVE with moderately reducedfunction; s/p TAVR (mean gradient 15 mmHg); mild AI; severe MACwith severe MS (mean gradient 18 mmHg); findings of RVdysfunction, flattened ventricular septum suggest pulmonaryhypertension; compared to 07/17/13, mean gradient across MV hasincreased from 14 to 18 mmHg.  LHC 03/20/13 LM:  90% distal stenosis. LAD: The LAD is occluded at the first septal perforator. The entirety of the LAD is severely calcified always to the LV apex. LCx: The left circumflex is totally occluded.  RCA: The RCA is severely calcified throughout its entirety. Mid and dist stent ok with mild in-stent restenosis; diffuse disease throughout the proximal, mid, and distal RCA, but no more than 40-50% stenosis noted. S-OM1/OM2: Widely patent throughout LIMA-D2/LAD: The diagonal is patent beyond the LIMA anastomosis with 70% stenosis noted as it divides into twin vessels. The diagonal is small through this region. The sequential limb to the LAD is patent. However, the LAD is totally occluded at the anastomotic site. Previously the vessel was severely diseased throughout. S-RCA known to be occluded. Left ventriculography: Deferred because of chronic kidney disease Final Conclusions:  1. Severe aortic stenosis 2. Moderate to severe mitral stenosis 3. Severe native three-vessel disease with total occlusion of the LAD, total occlusion left circumflex, and continued patency of  the severely calcified, moderately diseased dominant  RCA 4. Status post aortocoronary bypass surgery with continued patency of the saphenous vein graft sequential to OM1 and OM 2, known occlusion of the saphenous vein graft to RCA branches, and continued patency of the LIMA sequential to diagonal 2 and the mid LAD. However, total occlusion of the mid LAD just beyond the LIMA anastomosis. 5. Severe pulmonary hypertension.  Carotid US 3/13 Bilateral ICA < 50%  Past Medical History  Diagnosis Date  . GERD (gastroesophageal reflux disease)   . Hyperlipidemia   . Hypertension   . Diabetes mellitus   . Carotid artery disease   . Cellulitis and abscess of leg, except foot   . Leg edema   . Right knee sprain   . Cramps, muscle, general   . Low back pain   . Hiatal hernia   . Chills   . Hearing loss   . Nasal congestion   . Cough   . Wheezing   . Generalized headaches   . CAD (coronary artery disease) 11/05/1996    a. CABG x6 by Dr Redmond Pulling - LIMA to Diag+LAD, SVG to OM1+OM2, SVG to PDA+RPL, b. VG->RCA known to be occluded;  c. 05/2012 PCI/Rota/BMS to m/dRCA w/ 3.5x15 Vision BMS x 2 post-dil to 3.75.  Marland Kitchen Mitral stenosis     a. moderate by echo 04/2012.  Marland Kitchen Chronic diastolic congestive heart failure     a. 04/2012 Echo: EF 55-60-%  . Aortic stenosis     a. 04/2012 s/p baloon valvuloplasty w/ reduction in gradient from 47 to 27.  . Pulmonary hypertension     a. 05/2012 RH cath: PA 97/32(54)  . Venous insufficiency (chronic) (peripheral)   . Sleep apnea     no cpap  sleep study >5 yrs  . Shortness of breath   . Anxiety   . Arthritis   . 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 Fracture of humerus, proximal, right, closed 2016    Past Surgical History  Procedure Laterality Date  . Ett cardiolite  12/31/2006    Mild-Mod distal inf/apical ischemia  . Open heart surgery    . Acute or chronic diastolic hf  3/9/ - 09/08/7339     RLE cellulitis - HOSP  . Doppler echocardiography  10/11/2008    Mild AS, Mild-Mod MS, Severe LVH  . Le arterial and venous US  10/11/2008    Art clear.  Venous Neg DVT  . Appendectomy  12/25/2010    Dr Redmond Pulling  . Umbilical hernia repair  12/25/2010    Dr Redmond Pulling  . Hernia repair  93/79/02    umbilical hernia repair   . Tee without cardioversion  03/20/2012    Procedure: TRANSESOPHAGEAL ECHOCARDIOGRAM (TEE);  Surgeon: [name], MD;  Location: [facility];  Service: Cardiovascular;  Laterality: N/A;  . Cardiac catheterization    . Coronary artery bypass graft    . Transcatheter aortic valve replacement, transfemoral N/A 06/16/2013    Procedure: TRANSCATHETER AORTIC VALVE REPLACEMENT, TRANSFEMORAL;  Surgeon: [name], MD;  Location: [facility];  Service: Open Heart Surgery;  Laterality: N/A;  . Intraoperative transesophageal echocardiogram N/A 06/16/2013    Procedure: INTRAOPERATIVE TRANSESOPHAGEAL ECHOCARDIOGRAM;  Surgeon: [name], MD;  Location[facility];  Service: Open Heart Surgery;  Laterality: N/A;  . Percutaneous coronary rotoblator intervention (pci-r) N/A 05/07/2012    Procedure: PERCUTANEOUS CORONARY ROTOBLATOR INTERVENTION (PCI-R);  Surgeon: [name], MD;  Location: [facility] CATH LAB;  Service: Cardiovascular;  Laterality: N/A;  . Left and right heart catheterization with coronary angiogram N/A 03/20/2013    Procedure: LEFT AND RIGHT HEART CATHETERIZATION WITH CORONARY ANGIOGRAM;  Surgeon: Blane Ohara, MD;  Location: [facility] CATH LAB;  Service: Cardiovascular;  Laterality: N/A;     Current Outpatient Prescriptions  Medication Sig Dispense Refill  . ALPRAZolam (XANAX) 0.25 MG tablet Take 1 tablet (0.25 mg total) by mouth 2 (two) times daily as needed for anxiety. 10 tablet 0  . aspirin EC 81 MG tablet Take 81 mg by mouth daily.    . clopidogrel (PLAVIX) 75 MG tablet Take 1 tablet (75 mg total) by mouth daily with breakfast. 30 tablet 11  . Coenzyme Q10 300 MG CAPS Take 300 mg  by mouth daily.     . colchicine 0.6 MG tablet Take 1 tablet (0.6 mg total) by mouth daily as needed (for gout). 20 tablet 0  . ezetimibe (ZETIA) 10 MG tablet TAKE ONE TABLET BY MOUTH EACH EVENING 30 tablet 11  . fish oil-omega-3 fatty acids 1000 MG capsule Take 1 g by mouth 2 (two) times daily.     . furosemide (LASIX) 80 MG tablet TAKE 2 TABLETS BY MOUTH IN THE MORNING AND 1 TABLET IN THE EVENING 90 tablet 11  . insulin glargine (LANTUS) 100 UNIT/ML injection Per endocrine clinic    . Insulin Zinc Human (NOVOLIN L [place]) Inject into the skin 3 (three) times daily. SLIDING SCALE    . JANUVIA 100 MG tablet Take 100 mg by mouth at bedtime.     . methocarbamol (ROBAXIN) 500 MG tablet Take 1 tablet (500 mg total) by mouth daily as needed (leg cramps). 30 tablet 1  . metolazone (ZAROXOLYN) 2.5 MG tablet Take on Monday and Thursday's only Take one tablet by mouth on Monday morning 30 minutes prior to your Furosemide dosage, also use as needed for swelling 30 tablet 2  . nitroGLYCERIN (NITROSTAT) 0.4 MG SL tablet Place 1 tablet (0.4 mg total) under the tongue every 5 (five) minutes as needed for chest pain. May repeat up to 3 times as needed 30 tablet 5  . pantoprazole (PROTONIX) 40 MG tablet Take 1 tablet (40 mg total) by mouth every morning. 30 tablet 11  . polyethylene glycol powder (GLYCOLAX/MIRALAX) powder Take 17 g by mouth daily as needed (for constipation). 850 g 1  . potassium chloride SA (K-DUR,KLOR-CON) 20 MEQ tablet TAKE TWO (2) TABLETS BY MOUTH DAILY; take extra 20 meq on metolazone days    . Powders (ANTI MONKEY BUTT) POWD Apply 1 application topically as needed (burning itching drying up). Apply to legs and rub in    . ranitidine (ZANTAC) 150 MG tablet Take 150 mg by mouth 2 (two) times daily.     . repaglinide (PRANDIN) 2 MG tablet Take 2 mg by mouth as directed. Sliding Scale    . sertraline (ZOLOFT) 100 MG tablet Take 1 tablet (100 mg total) by mouth daily. 30 tablet 5  . traMADol (ULTRAM)  50 MG tablet Take 1-2 tablets (50-100 mg total) by mouth every 12 (twelve) hours as needed for severe pain (max 4 pills per day). 60 tablet 0  . Vitamin D, Ergocalciferol, (DRISDOL) 50000 UNITS CAPS Take 50,000 Units by mouth every 7 (seven) days. Take every week on saturday    . rosuvastatin (CRESTOR) 20 MG tablet Take 1 tablet (20 mg total) by mouth at bedtime. 30 tablet  6  . [DISCONTINUED] promethazine (PHENERGAN) 25 MG tablet Take 1/2 to 1 tablet up to every 6 hours as needed for nausea.      No current facility-administered medications for this visit.    Allergies:   Adhesive and Celecoxib    Social History:  The patient  reports that he has never smoked. He has never used smokeless tobacco. He reports that he does not drink alcohol or use illicit drugs.   Family History:  The patient's family history includes Diabetes in his other.    ROS:   Please see the history of present illness.   Review of Systems  Constitution: Positive for malaise/fatigue.  Cardiovascular: Positive for leg swelling.  Musculoskeletal: Positive for joint pain.  Gastrointestinal: Positive for constipation.  Neurological: Positive for dizziness.  All other systems reviewed and are negative.     PHYSICAL EXAM: VS:  BP 130/56 mmHg  Pulse 75  Ht 5' 10.5" (1.791 m)  Wt 233 lb 12.8 oz (106.051 kg)  BMI 33.06 kg/m2    Wt Readings from Last 3 Encounters:  08/17/14 233 lb 12.8 oz (106.051 kg)  07/30/14 233 lb 4 oz (105.802 kg)  07/07/14 229 lb 12.8 oz (104.237 kg)     GEN: Well nourished, well developed, in no acute distress HEENT: normal Neck: no JVD, no masses Cardiac:  Distant S1/S2, RRR; I cannot appreciate a murmur, no rubs or gallops, no edema  Respiratory:  Decreased breath sounds bilaterally, no wheezing, rhonchi or rales. GI: soft, nontender, nondistended, + BS MS: no deformity or atrophy Skin: warm and dry  Neuro:  CNs II-XII intact, Strength and sensation are intact Psych: Normal  affect   EKG:  EKG is ordered today.  It demonstrates:   NSR, HR 75, normal axis, inferior Q waves, poor R-wave progression with diffuse ST abnormalities, first-degree AV block with a PR interval of 290 ms, no change since prior tracing   Recent Labs: 06/14/2014: Pro B Natriuretic peptide (BNP) 608.0* 07/30/2014: BUN 27*; Creat 1.58*; Potassium 3.6; Sodium 138    Lipid Panel No results found for: CHOL, TRIG, HDL, CHOLHDL, VLDL, LDLCALC, LDLDIRECT    ASSESSMENT AND PLAN:  Chronic diastolic congestive heart failure in the setting of valvular heart disease:  The patient has worsening volume excess greater than 72 hours after his dose of metolazone. He usually has a good response to taking this drug. I have asked him to increase his metolazone to Mondays and Thursdays each week. He does have significant cramping after he takes this medication.  I have asked him to take an extra K+ 20 mEq on Metolazone days.  Repeat BMET in 2 weeks.   Mitral stenosis:  He is not a candidate for Balloon Mitral Valvuloplasty.  Aortic stenosis S/P TAVR (transcatheter aortic valve replacement):  Stable gradient by last Echo.   Coronary artery disease involving native coronary artery of native heart without angina pectoris:  No anginal symptoms.  Last cath in 2015 demonstrated severe native three-vessel disease with total occlusion of the LAD, total occlusion left circumflex, and continued patency of the severely calcified, moderately diseased dominant RCA.  There was continued patency of the saphenous vein graft sequential to OM1 and OM 2, known occlusion of the saphenous vein graft to RCA branches, and continued patency of the LIMA sequential to diagonal 2 and the mid LAD. However, there was total occlusion of the mid LAD just beyond the LIMA anastomosis.  He is managed medically.  His chest tightness seems to  be associated with volume excess.  Continue ASA, Plavix, statin.  CKD (chronic kidney disease), stage 3  (moderate):  Creatinine stable by recent measurement.  Check BMET in 2 weeks with adjustment in diuretics.     Current medicines are reviewed at length with the patient today.  Concerns regarding medicines are as outlined above.  The following changes have been made:    As above  Labs/ tests ordered today include:   Orders Placed This Encounter  Procedures  . Basic Metabolic Panel (BMET)  . EKG 12-Lead     Disposition:   FU with Dr. [name] in August as planned.    Signed, Versie Starks, MHS 08/17/2014 4:28 PM    [facility] [address], [place], [place]  [address] Phone: [phone]; Fax: [phone]

## 2014-08-24 ENCOUNTER — Telehealth: Payer: Self-pay

## 2014-08-24 NOTE — Telephone Encounter (Signed)
Diabetic Bundle. I contacted the pt's wife advised the pt's A1C blood test is due. She stated the pt had his A1C blood test done last week by his Endocrinologist.

## 2014-08-30 ENCOUNTER — Other Ambulatory Visit: Payer: Self-pay

## 2014-08-31 ENCOUNTER — Other Ambulatory Visit (INDEPENDENT_AMBULATORY_CARE_PROVIDER_SITE_OTHER): Payer: Medicare Other

## 2014-08-31 ENCOUNTER — Telehealth: Payer: Self-pay | Admitting: *Deleted

## 2014-08-31 DIAGNOSIS — I5032 Chronic diastolic (congestive) heart failure: Secondary | ICD-10-CM | POA: Diagnosis not present

## 2014-08-31 DIAGNOSIS — N183 Chronic kidney disease, stage 3 (moderate): Secondary | ICD-10-CM

## 2014-08-31 DIAGNOSIS — E1149 Type 2 diabetes mellitus with other diabetic neurological complication: Secondary | ICD-10-CM

## 2014-08-31 LAB — BASIC METABOLIC PANEL
BUN: 35 mg/dL — AB (ref 6–23)
CO2: 33 mEq/L — ABNORMAL HIGH (ref 19–32)
Calcium: 10.1 mg/dL (ref 8.4–10.5)
Chloride: 91 mEq/L — ABNORMAL LOW (ref 96–112)
Creatinine, Ser: 1.87 mg/dL — ABNORMAL HIGH (ref 0.40–1.50)
GFR: 37.91 mL/min — AB (ref >60.00)
Glucose, Bld: 289 mg/dL — ABNORMAL HIGH (ref 70–99)
POTASSIUM: 3.5 meq/L (ref 3.5–5.1)
SODIUM: 135 meq/L (ref 135–145)

## 2014-08-31 NOTE — Telephone Encounter (Signed)
Patient notified of lab results and notation from Truitt Merle, NP. Patient agreeable to coming in next week for repeat bloodwork. Patient verbalized understanding and agreement to continue with current treatment plan. BMET order placed.

## 2014-09-08 ENCOUNTER — Other Ambulatory Visit (INDEPENDENT_AMBULATORY_CARE_PROVIDER_SITE_OTHER): Payer: Medicare Other | Admitting: *Deleted

## 2014-09-08 DIAGNOSIS — E1149 Type 2 diabetes mellitus with other diabetic neurological complication: Secondary | ICD-10-CM

## 2014-09-08 DIAGNOSIS — I5032 Chronic diastolic (congestive) heart failure: Secondary | ICD-10-CM | POA: Diagnosis not present

## 2014-09-08 LAB — BASIC METABOLIC PANEL
BUN: 33 mg/dL — ABNORMAL HIGH (ref 6–23)
CO2: 31 mEq/L (ref 19–32)
Calcium: 9.6 mg/dL (ref 8.4–10.5)
Chloride: 93 mEq/L — ABNORMAL LOW (ref 96–112)
Creatinine, Ser: 1.76 mg/dL — ABNORMAL HIGH (ref 0.40–1.50)
GFR: 40.66 mL/min — ABNORMAL LOW (ref >60.00)
Glucose, Bld: 151 mg/dL — ABNORMAL HIGH (ref 70–99)
Potassium: 3.3 mEq/L — ABNORMAL LOW (ref 3.5–5.1)
Sodium: 136 mEq/L (ref 135–145)

## 2014-09-09 ENCOUNTER — Other Ambulatory Visit: Payer: Self-pay | Admitting: *Deleted

## 2014-09-09 DIAGNOSIS — E876 Hypokalemia: Secondary | ICD-10-CM

## 2014-09-09 MED ORDER — POTASSIUM CHLORIDE CRYS ER 20 MEQ PO TBCR: EXTENDED_RELEASE_TABLET | ORAL | Status: DC

## 2014-09-23 ENCOUNTER — Other Ambulatory Visit (INDEPENDENT_AMBULATORY_CARE_PROVIDER_SITE_OTHER): Payer: Medicare Other | Admitting: *Deleted

## 2014-09-23 DIAGNOSIS — E876 Hypokalemia: Secondary | ICD-10-CM | POA: Diagnosis not present

## 2014-09-23 LAB — BASIC METABOLIC PANEL
BUN: 35 mg/dL — ABNORMAL HIGH (ref 6–23)
CO2: 33 mEq/L — ABNORMAL HIGH (ref 19–32)
Calcium: 10.1 mg/dL (ref 8.4–10.5)
Chloride: 91 mEq/L — ABNORMAL LOW (ref 96–112)
Creatinine, Ser: 1.8 mg/dL — ABNORMAL HIGH (ref 0.40–1.50)
GFR: 39.61 mL/min — ABNORMAL LOW (ref >60.00)
Glucose, Bld: 130 mg/dL — ABNORMAL HIGH (ref 70–99)
Potassium: 3.2 mEq/L — ABNORMAL LOW (ref 3.5–5.1)
Sodium: 135 mEq/L (ref 135–145)

## 2014-09-23 NOTE — Addendum Note (Signed)
Addended by: Eulis Foster on: 09/23/2014 12:02 PM   Modules accepted: Orders

## 2014-09-24 ENCOUNTER — Other Ambulatory Visit: Payer: Self-pay | Admitting: Primary Care

## 2014-09-24 ENCOUNTER — Telehealth: Payer: Self-pay | Admitting: Cardiovascular Disease

## 2014-09-24 DIAGNOSIS — E876 Hypokalemia: Secondary | ICD-10-CM

## 2014-09-24 MED ORDER — POTASSIUM CHLORIDE CRYS ER 20 MEQ PO TBCR: 40.0000 meq | EXTENDED_RELEASE_TABLET | Freq: Two times a day (BID) | ORAL | Status: DC

## 2014-09-24 NOTE — Telephone Encounter (Signed)
New Message  Pt returning Lauren's phone call- labs. Please call back and discuss.

## 2014-09-24 NOTE — Telephone Encounter (Signed)
Please call in.  Thanks.   

## 2014-09-24 NOTE — Telephone Encounter (Signed)
Refill request. Last prescribed on 07/07/14. Last seen on 07/30/14. No future apt.

## 2014-09-24 NOTE — Telephone Encounter (Signed)
Medication phoned to pharmacy.  

## 2014-09-24 NOTE — Telephone Encounter (Signed)
Notes Recorded by [name], MD on 09/23/2014 at 5:47 PM Would increase K-Dur 40 meq BID, repeat BMET 2-3 weeks, and continue metolazone as he is doing.   I spoke with the pt's wife and made her aware of potassium instructions.  New Rx sent to the pharmacy.  BMP will be rechecked on 10/07/14.

## 2014-09-25 ENCOUNTER — Other Ambulatory Visit: Payer: Self-pay | Admitting: Primary Care

## 2014-10-07 ENCOUNTER — Other Ambulatory Visit (INDEPENDENT_AMBULATORY_CARE_PROVIDER_SITE_OTHER): Payer: Medicare Other

## 2014-10-07 DIAGNOSIS — E876 Hypokalemia: Secondary | ICD-10-CM | POA: Diagnosis not present

## 2014-10-07 LAB — BASIC METABOLIC PANEL
BUN: 39 mg/dL — ABNORMAL HIGH (ref 6–23)
CHLORIDE: 94 meq/L — AB (ref 96–112)
CO2: 30 mEq/L (ref 19–32)
CREATININE: 1.8 mg/dL — AB (ref 0.40–1.50)
Calcium: 9.6 mg/dL (ref 8.4–10.5)
GFR: 39.61 mL/min — ABNORMAL LOW (ref >60.00)
Glucose, Bld: 150 mg/dL — ABNORMAL HIGH (ref 70–99)
Potassium: 3.6 mEq/L (ref 3.5–5.1)
SODIUM: 134 meq/L — AB (ref 135–145)

## 2014-10-25 ENCOUNTER — Other Ambulatory Visit: Payer: Self-pay | Admitting: *Deleted

## 2014-10-25 MED ORDER — SERTRALINE HCL 100 MG PO TABS: 100.0000 mg | ORAL_TABLET | Freq: Every day | ORAL | Status: DC

## 2014-10-29 ENCOUNTER — Ambulatory Visit (INDEPENDENT_AMBULATORY_CARE_PROVIDER_SITE_OTHER): Payer: Medicare Other | Admitting: Cardiovascular Disease

## 2014-10-29 ENCOUNTER — Encounter: Payer: Self-pay | Admitting: Cardiovascular Disease

## 2014-10-29 ENCOUNTER — Ambulatory Visit: Payer: Medicare Other | Admitting: Nurse Practitioner

## 2014-10-29 VITALS — BP 118/70 | HR 80 | Ht 70.5 in | Wt 231.8 lb

## 2014-10-29 DIAGNOSIS — I5032 Chronic diastolic (congestive) heart failure: Secondary | ICD-10-CM | POA: Diagnosis not present

## 2014-10-29 DIAGNOSIS — I2510 Atherosclerotic heart disease of native coronary artery without angina pectoris: Secondary | ICD-10-CM

## 2014-10-29 MED ORDER — METOLAZONE 2.5 MG PO TABS: 2.5000 mg | ORAL_TABLET | ORAL | Status: DC

## 2014-10-29 NOTE — Progress Notes (Signed)
Pt complains of worsening SOB.  Pt's oxygen saturation on Room Air 95%.  Pt ambulated through the office and oxygen saturation dropped to 88% on room air. Per Dr [name] order given for home health to provide home oxygen therapy.

## 2014-10-29 NOTE — Patient Instructions (Addendum)
Medication Instructions:  Your physician has recommended you make the following change in your medication:  1. INCREASE Metolazone 2.5mg  to one by mouth every other day (30 minutes prior to your morning dosage of Furosemide)  Labwork: Your physician recommends that you return for lab work in: 3 MONTHS (BMP)  Testing/Procedures: No new orders.   Follow-Up: Your physician recommends that you schedule a follow-up appointment in: 3 MONTHS with Dr [name]   Any Other Special Instructions Will Be Listed Below (If Applicable).

## 2014-10-29 NOTE — Progress Notes (Signed)
Cardiology Office Note Date:  10/31/2014   ID:  [name] Motta., DOB Jul 11, 1940, MRN [id number]  PCP:  [name], MD  Cardiologist:  [name], MD    No chief complaint on file.   History of Present Illness: [name] A Rawlins Stuard. is a 74 y.o. male who presents for follow-up evaluation. He has been followed for coronary disease, rheumatic heart disease, and CHF.   He underwent 6 vessel CABG in 1998. He's undergone multiple PCI procedures, most recently with rotational atherectomy and bare-metal stenting of the native right coronary artery in 2014. 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 He had an uncomplicated postoperative course. Other comorbid medical conditions include longstanding Type 2 diabetes, stage 3 CKD, and chronic diastolic heart failure.  He continues to have shortness of breath with low-level activity. Responds variably to metolazone. Reports compliance with twice daily lasix. No orthopnea or PND, but breathing is getting worse. Reports significant heat intolerance and hoping he is going to feel better as the weather changes.   Past Medical History  Diagnosis Date  . GERD (gastroesophageal reflux disease)   . Hyperlipidemia   . Hypertension   . Diabetes mellitus   . Carotid artery disease   . Cellulitis and abscess of leg, except foot   . Leg edema   . Right knee sprain   . Cramps, muscle, general   . Low back pain   . Hiatal hernia   . Chills   . Hearing loss   . Nasal congestion   . Cough   . Wheezing   . Generalized headaches   . CAD (coronary artery disease) 11/05/1996    a. CABG x6 by Dr Redmond Pulling - LIMA to Diag+LAD, SVG to OM1+OM2, SVG to PDA+RPL, b. VG->RCA known to be occluded;  c. 05/2012 PCI/Rota/BMS to m/dRCA w/ 3.5x15 Vision BMS x 2 post-dil to 3.75.  Marland Kitchen Mitral stenosis     a. moderate by echo 04/2012.  Marland Kitchen  Chronic diastolic congestive heart failure     a. 04/2012 Echo: EF 55-60-%  . Aortic stenosis     a. 04/2012 s/p baloon valvuloplasty w/ reduction in gradient from 47 to 27.  . Pulmonary hypertension     a. 05/2012 RH cath: PA 97/32(54)  . Venous insufficiency (chronic) (peripheral)   . Sleep apnea     no cpap  sleep study >5 yrs  . Shortness of breath   . Anxiety   . Arthritis   . 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 Fracture of humerus, proximal, right, closed 2016    Past Surgical History  Procedure Laterality Date  . Ett cardiolite  12/31/2006    Mild-Mod distal inf/apical ischemia  . Open heart surgery    . Acute or chronic diastolic hf  4/7/ - 08/07/4648    RLE cellulitis - HOSP  . Doppler echocardiography  10/11/2008    Mild AS, Mild-Mod MS, Severe LVH  . Le arterial and venous US  10/11/2008    Art clear.  Venous Neg DVT  . Appendectomy  12/25/2010    Dr Redmond Pulling  . Umbilical hernia repair  12/25/2010    Dr Redmond Pulling  . Hernia repair  35/46/56    umbilical hernia repair   . Tee without cardioversion  03/20/2012  Procedure: TRANSESOPHAGEAL ECHOCARDIOGRAM (TEE);  Surgeon: [name], MD;  Location: [facility];  Service: Cardiovascular;  Laterality: N/A;  . Cardiac catheterization    . Coronary artery bypass graft    . Transcatheter aortic valve replacement, transfemoral N/A 06/16/2013    Procedure: TRANSCATHETER AORTIC VALVE REPLACEMENT, TRANSFEMORAL;  Surgeon: [name], MD;  Location: [facility];  Service: Open Heart Surgery;  Laterality: N/A;  . Intraoperative transesophageal echocardiogram N/A 06/16/2013    Procedure: INTRAOPERATIVE TRANSESOPHAGEAL ECHOCARDIOGRAM;  Surgeon: [name], MD;  Location[facility] OR;  Service: Open Heart Surgery;  Laterality: N/A;  . Percutaneous coronary rotoblator intervention (pci-r) N/A 05/07/2012    Procedure:  PERCUTANEOUS CORONARY ROTOBLATOR INTERVENTION (PCI-R);  Surgeon: [name], MD;  Location: [facility] CATH LAB;  Service: Cardiovascular;  Laterality: N/A;  . Left and right heart catheterization with coronary angiogram N/A 03/20/2013    Procedure: LEFT AND RIGHT HEART CATHETERIZATION WITH CORONARY ANGIOGRAM;  Surgeon: Blane Ohara, MD;  Location: [facility] CATH LAB;  Service: Cardiovascular;  Laterality: N/A;    Current Outpatient Prescriptions  Medication Sig Dispense Refill  . ALPRAZolam (XANAX) 0.25 MG tablet Take 1 tablet (0.25 mg total) by mouth 2 (two) times daily as needed for anxiety. 10 tablet 0  . aspirin EC 81 MG tablet Take 81 mg by mouth daily.    . clopidogrel (PLAVIX) 75 MG tablet Take 1 tablet (75 mg total) by mouth daily with breakfast. 30 tablet 11  . Coenzyme Q10 300 MG CAPS Take 300 mg by mouth daily.     . colchicine 0.6 MG tablet Take 1 tablet (0.6 mg total) by mouth daily as needed (for gout). 20 tablet 0  . ezetimibe (ZETIA) 10 MG tablet TAKE ONE TABLET BY MOUTH EACH EVENING 30 tablet 11  . fish oil-omega-3 fatty acids 1000 MG capsule Take 1 g by mouth 2 (two) times daily.     . furosemide (LASIX) 80 MG tablet TAKE 2 TABLETS BY MOUTH IN THE MORNING AND 1 TABLET IN THE EVENING 90 tablet 11  . Insulin Zinc Human (NOVOLIN L [place]) Inject 70 Units into the skin 3 (three) times daily. SLIDING SCALE    . JANUVIA 100 MG tablet Take 100 mg by mouth at bedtime.     . methocarbamol (ROBAXIN) 500 MG tablet Take 1 tablet (500 mg total) by mouth daily as needed (leg cramps). 30 tablet 1  . metolazone (ZAROXOLYN) 2.5 MG tablet Take 1 tablet (2.5 mg total) by mouth every other day. 30 tablet 4  . nitroGLYCERIN (NITROSTAT) 0.4 MG SL tablet Place 1 tablet (0.4 mg total) under the tongue every 5 (five) minutes as needed for chest pain. May repeat up to 3 times as needed 30 tablet 5  . pantoprazole (PROTONIX) 40 MG tablet Take 1 tablet (40 mg total) by mouth every morning. 30 tablet 11  .  polyethylene glycol powder (GLYCOLAX/MIRALAX) powder Take 17 g by mouth daily as needed (for constipation). 850 g 1  . potassium chloride SA (K-DUR,KLOR-CON) 20 MEQ tablet Take 2 tablets (40 mEq total) by mouth 2 (two) times daily. 120 tablet 3  . Powders (ANTI MONKEY BUTT) POWD Apply 1 application topically as needed (burning itching drying up). Apply to legs and rub in    . ranitidine (ZANTAC) 150 MG tablet Take 150 mg by mouth 2 (two) times daily.     . repaglinide (PRANDIN) 2 MG tablet Take 2 mg by mouth as directed. Sliding Scale    . sertraline (ZOLOFT) 100  MG tablet Take 1 tablet (100 mg total) by mouth daily. 90 tablet 1  . traMADol (ULTRAM) 50 MG tablet TAKE 1-2 TABLETS BY MOUTH EVERY 12 HOURSAS NEEDED FOR SEVERE PAIN (MAX 4 PILLS PER DAY) 60 tablet 1  . Vitamin D, Ergocalciferol, (DRISDOL) 50000 UNITS CAPS Take 50,000 Units by mouth every 7 (seven) days. Take every week on saturday    . NOVOLOG 100 UNIT/ML injection     . rosuvastatin (CRESTOR) 20 MG tablet Take 1 tablet (20 mg total) by mouth at bedtime. 30 tablet 6  . [DISCONTINUED] promethazine (PHENERGAN) 25 MG tablet Take 1/2 to 1 tablet up to every 6 hours as needed for nausea.      No current facility-administered medications for this visit.    Allergies:   Adhesive and Celecoxib   Social History:  The patient  reports that he has never smoked. He has never used smokeless tobacco. He reports that he does not drink alcohol or use illicit drugs.   Family History:  The patient's family history includes Diabetes in his father and son; Emphysema in his mother; Heart disease in his father; Other in his son.    ROS:  Please see the history of present illness.  Otherwise, review of systems is positive for fatigue, leg swelling, back pain, muscle pain, dizziness, constipation.  All other systems are reviewed and negative.    PHYSICAL EXAM: VS:  BP 118/70 mmHg  Pulse 80  Ht 5' 10.5" (1.791 m)  Wt 231 lb 12.8 oz (105.144 kg)  BMI  32.78 kg/m2  SpO2 94% , BMI Body mass index is 32.78 kg/(m^2). GEN: Well nourished, well developed, overweight male in no acute distress HEENT: normal Neck: JVP elevated Cardiac: RRR with 2/6 diastolic murmur at the LSB, 2/6 SEM at the RUSB               Respiratory:  clear to auscultation bilaterally, normal work of breathing GI: soft, nontender, nondistended, + BS MS: no deformity or atrophy Ext: 1+ bilateral pretibial edema Skin: warm and dry, dressings in place for venous ulcers Neuro:  Strength and sensation are intact Psych: euthymic mood, full affect  EKG:  EKG is not ordered today.  Recent Labs: 06/14/2014: Pro B Natriuretic peptide (BNP) 608.0* 10/07/2014: BUN 39*; Creatinine, Ser 1.80*; Potassium 3.6; Sodium 134*   Lipid Panel  No results found for: CHOL, TRIG, HDL, CHOLHDL, VLDL, LDLCALC, LDLDIRECT    Wt Readings from Last 3 Encounters:  10/29/14 231 lb 12.8 oz (105.144 kg)  08/17/14 233 lb 12.8 oz (106.051 kg)  07/30/14 233 lb 4 oz (105.802 kg)     Cardiac Studies Reviewed: 2D Echo 06/14/2014: Study Conclusions  - Left ventricle: The cavity size was normal. Wall thickness was increased in a pattern of moderate LVH. Systolic function was normal. The estimated ejection fraction was in the range of 55% to 60%. There is hypokinesis of the mid-apicalanteroseptal myocardium. - Ventricular septum: The contour showed diastolic flattening and systolic flattening. - Aortic valve: A bioprosthesis was present. There was mild stenosis. There was mild regurgitation. - Aortic root: The aortic root was mildly dilated. - Mitral valve: Severely calcified annulus. Mobility was restricted. The findings are consistent with severe stenosis. - Left atrium: The atrium was severely dilated. - Right ventricle: The cavity size was mildly dilated. Hypertrophy was present. Systolic function was moderately reduced. - Right atrium: The atrium was moderately dilated. -  Pulmonary arteries: Systolic pressure was mildly increased. PA peak pressure: 40 mm  Hg (S).  Impressions:  - Hypokinesis of the distal septum with overall preserved LV function; biatrial enlargement; mild RVE with moderately reduced function; s/p TAVR (mean gradient 15 mmHg); mild AI; severe MAC with severe MS (mean gradient 18 mmHg); findings of RV dysfunction, flattened ventricular septum suggest pulmonary hypertension; compared to 07/17/13, mean gradient across MV has increased from 14 to 18 mmHg.  ASSESSMENT AND PLAN: 1.  Chronic diastolic HF, NYHA III: progressive symptoms. He understands limitations of medical therapy in setting of untreatable severe mitral stenosis. Will increase metolazone to QOD dosing. He is not interested in further invasive therapies.  2. Valvular heart disease: normal function THV in aortic position. RV pressure and volume overload noted and this is likely related to severe MS. His valve does not have favorable characteristics for percutaneous BMV.  3. CAD: appears stable without clear evidence of angina. Severe diffuse distal vessel CAD noted during previous evalaution.  4. CKD, stage 3: will arrange FU labs prior to next OV in 3 months.  Current medicines are reviewed with the patient today.  The patient does not have concerns regarding medicines.  Labs/ tests ordered today include:   Orders Placed This Encounter  Procedures  . Basic Metabolic Panel (BMET)    Disposition:   FU 3 months  Signed, [name], MD  10/31/2014 9:34 PM    [address], [place], [place]  [address] Phone: [phone]; Fax: [phone]

## 2014-10-31 ENCOUNTER — Encounter: Payer: Self-pay | Admitting: Cardiovascular Disease

## 2014-11-03 ENCOUNTER — Telehealth: Payer: Self-pay | Admitting: Cardiovascular Disease

## 2014-11-03 NOTE — Telephone Encounter (Signed)
Returned call to [place] at [facility].  She stated that the O2 order would have to be amended to include "O2 level after ambulation with O2"  Says it is required for Medicare.  They are unable to provide this number due to a conflict of interest since they are the O2 provider.

## 2014-11-03 NOTE — Telephone Encounter (Signed)
New Message  [name] [name] with [facility] Calling to discuss an Oxygen order she recieved. She states that she will need more information before she can deliver the oxygen. Request a call back to discuss in detail. No further details provided at this time from the rep.

## 2014-11-04 NOTE — Telephone Encounter (Signed)
I spoke with the pt's wife and she will bring him into the office tomorrow around 12:30.  We will place the pt on oxygen and walk him around the office to see if O2 sat responds.

## 2014-11-05 ENCOUNTER — Other Ambulatory Visit: Payer: Self-pay

## 2014-11-05 DIAGNOSIS — I350 Nonrheumatic aortic (valve) stenosis: Secondary | ICD-10-CM

## 2014-11-05 DIAGNOSIS — R0602 Shortness of breath: Secondary | ICD-10-CM

## 2014-11-05 NOTE — Progress Notes (Signed)
O2 sat at rest today on room air was 93%.  Pt ambulated in the office while on oxygen 2 liters and O2 sat increased to 99%.  I will forward this information to [place].

## 2014-11-05 NOTE — Progress Notes (Signed)
O2 sat at rest today on room air was 93%.  Pt ambulated through the office and oxygen saturation dropped to 88% on room air.  Pt ambulated while on oxygen 2 liters and O2sat increased to 99%.  I will forward this updated information containing all documentation to [facility].

## 2014-11-05 NOTE — Telephone Encounter (Signed)
The pt came into the office and ambulated while wearing oxygen.  Addendum done to 10/29/14 office note and faxed to [facility].

## 2014-11-06 ENCOUNTER — Other Ambulatory Visit: Payer: Self-pay | Admitting: Cardiology

## 2014-11-19 ENCOUNTER — Other Ambulatory Visit: Payer: Self-pay | Admitting: Cardiology

## 2014-11-24 ENCOUNTER — Encounter: Payer: Self-pay | Admitting: Cardiovascular Disease

## 2014-11-24 NOTE — Telephone Encounter (Signed)
This encounter was created in error - please disregard.

## 2014-11-24 NOTE — Telephone Encounter (Signed)
New Message      Office calling stating they faxed a form on 11/16/14 to find out of Dr. [name] follows the pt's oxygen care, Please call back and advise.

## 2014-12-11 ENCOUNTER — Other Ambulatory Visit: Payer: Self-pay | Admitting: Cardiovascular Disease

## 2014-12-30 ENCOUNTER — Ambulatory Visit (INDEPENDENT_AMBULATORY_CARE_PROVIDER_SITE_OTHER): Payer: Medicare Other

## 2014-12-30 DIAGNOSIS — Z23 Encounter for immunization: Secondary | ICD-10-CM | POA: Diagnosis not present

## 2015-01-20 ENCOUNTER — Other Ambulatory Visit: Payer: Self-pay | Admitting: Cardiovascular Disease

## 2015-01-22 ENCOUNTER — Other Ambulatory Visit: Payer: Self-pay | Admitting: Cardiovascular Disease

## 2015-01-25 ENCOUNTER — Other Ambulatory Visit (INDEPENDENT_AMBULATORY_CARE_PROVIDER_SITE_OTHER): Payer: Medicare Other | Admitting: *Deleted

## 2015-01-25 DIAGNOSIS — I5032 Chronic diastolic (congestive) heart failure: Secondary | ICD-10-CM | POA: Diagnosis not present

## 2015-01-25 LAB — BASIC METABOLIC PANEL
BUN: 42 mg/dL — ABNORMAL HIGH (ref 7–25)
CHLORIDE: 90 mmol/L — AB (ref 98–110)
CO2: 27 mmol/L (ref 20–31)
Calcium: 9.4 mg/dL (ref 8.6–10.3)
Creat: 1.83 mg/dL — ABNORMAL HIGH (ref 0.70–1.18)
Glucose, Bld: 228 mg/dL — ABNORMAL HIGH (ref 65–99)
POTASSIUM: 3.2 mmol/L — AB (ref 3.5–5.3)
SODIUM: 133 mmol/L — AB (ref 135–146)

## 2015-01-25 NOTE — Addendum Note (Signed)
Addended by: Eulis Foster on: 01/25/2015 08:34 AM   Modules accepted: Orders

## 2015-01-25 NOTE — Addendum Note (Signed)
Addended by: Eulis Foster on: 01/25/2015 11:37 AM   Modules accepted: Orders

## 2015-01-26 ENCOUNTER — Other Ambulatory Visit: Payer: Self-pay

## 2015-01-26 DIAGNOSIS — E876 Hypokalemia: Secondary | ICD-10-CM

## 2015-01-26 MED ORDER — POTASSIUM CHLORIDE CRYS ER 20 MEQ PO TBCR: 40.0000 meq | EXTENDED_RELEASE_TABLET | Freq: Three times a day (TID) | ORAL | Status: DC

## 2015-01-29 ENCOUNTER — Other Ambulatory Visit: Payer: Self-pay | Admitting: Cardiovascular Disease

## 2015-01-31 ENCOUNTER — Encounter: Payer: Self-pay | Admitting: Family Medicine

## 2015-01-31 ENCOUNTER — Ambulatory Visit (INDEPENDENT_AMBULATORY_CARE_PROVIDER_SITE_OTHER): Payer: Medicare Other | Admitting: Family Medicine

## 2015-01-31 VITALS — BP 120/50 | HR 70 | Temp 97.3°F | Wt 220.2 lb

## 2015-01-31 DIAGNOSIS — L03032 Cellulitis of left toe: Secondary | ICD-10-CM | POA: Diagnosis not present

## 2015-01-31 DIAGNOSIS — L089 Local infection of the skin and subcutaneous tissue, unspecified: Secondary | ICD-10-CM | POA: Diagnosis not present

## 2015-01-31 DIAGNOSIS — T148 Other injury of unspecified body region: Secondary | ICD-10-CM

## 2015-01-31 DIAGNOSIS — I2510 Atherosclerotic heart disease of native coronary artery without angina pectoris: Secondary | ICD-10-CM | POA: Diagnosis not present

## 2015-01-31 DIAGNOSIS — T148XXA Other injury of unspecified body region, initial encounter: Principal | ICD-10-CM

## 2015-01-31 MED ORDER — DOXYCYCLINE HYCLATE 100 MG PO TABS: 100.0000 mg | ORAL_TABLET | Freq: Two times a day (BID) | ORAL | Status: DC

## 2015-01-31 MED ORDER — SILVER SULFADIAZINE 1 % EX CREA: 1.0000 "application " | TOPICAL_CREAM | Freq: Every day | CUTANEOUS | Status: DC

## 2015-01-31 NOTE — Patient Instructions (Addendum)
You can try breaking the tramadol in half to see if that helps the pain without causing constipation.  Take a stool softener with it.   Silvadene cream and nonstick bandages.   Start doxycycline in the meantime.  Warm salt water soaks for your left foot.  Update me in a about 2 days.   Take care.

## 2015-01-31 NOTE — Progress Notes (Signed)
Pre visit review using our clinic review tool, if applicable. No additional management support is needed unless otherwise documented below in the visit note.  Some constipation at baseline, worse with tramadol- take about 3-4 doses per week.  D/w pt.  Other options would likely be worse for constipation or for renal function.  D/w pt.    R leg wound.  "The skin came off" about 3 days ago.  Not very painful but burns some.  Putting silver application on it.  No trauma.    L 1st toenail draining some pus when compressed.  The toe isn't ttp, no spreading erythema on the toe.  He does have chronic nail thickening on the toes.    Meds, vitals, and allergies reviewed.   ROS: See HPI.  Otherwise, noncontributory.  nad ncat R medial shin with 12x2 cm area of denuded skin, superficial involvement.  Surrounding 18cm patch of erythema, not circumferential on the leg.   Distally w/o erythema.   L 1st toe nail thick with pus expressed but the toe isn't ttp and normal cap refill on the L 1st toe.  No spreading erythema.

## 2015-02-01 DIAGNOSIS — T148XXA Other injury of unspecified body region, initial encounter: Principal | ICD-10-CM

## 2015-02-01 DIAGNOSIS — L089 Local infection of the skin and subcutaneous tissue, unspecified: Secondary | ICD-10-CM | POA: Insufficient documentation

## 2015-02-01 DIAGNOSIS — L03039 Cellulitis of unspecified toe: Secondary | ICD-10-CM | POA: Insufficient documentation

## 2015-02-01 NOTE — Assessment & Plan Note (Signed)
Presumed, start doxy and covered with nonstick bandage and silvadene.  He has no allergy to sulfa meds and should be able to tolerate.  D/w pt.  Update me in a few days.  Okay for outpatient f/u.

## 2015-02-01 NOTE — Assessment & Plan Note (Signed)
With presumed infection that appears to be limited to the nail/bed no need for I&D, start warm salt water soaks and doxy.  Update me in a few days.  Okay for outpatient f/u.

## 2015-02-02 ENCOUNTER — Ambulatory Visit (INDEPENDENT_AMBULATORY_CARE_PROVIDER_SITE_OTHER): Payer: Medicare Other | Admitting: Cardiovascular Disease

## 2015-02-02 ENCOUNTER — Encounter: Payer: Self-pay | Admitting: Cardiovascular Disease

## 2015-02-02 VITALS — BP 118/62 | HR 80 | Ht 69.0 in | Wt 220.8 lb

## 2015-02-02 DIAGNOSIS — R0600 Dyspnea, unspecified: Secondary | ICD-10-CM | POA: Diagnosis not present

## 2015-02-02 DIAGNOSIS — I350 Nonrheumatic aortic (valve) stenosis: Secondary | ICD-10-CM

## 2015-02-02 DIAGNOSIS — I5032 Chronic diastolic (congestive) heart failure: Secondary | ICD-10-CM | POA: Diagnosis not present

## 2015-02-02 DIAGNOSIS — I2510 Atherosclerotic heart disease of native coronary artery without angina pectoris: Secondary | ICD-10-CM

## 2015-02-02 NOTE — Patient Instructions (Addendum)
Medication Instructions:  Your physician recommends that you continue on your current medications as directed. Please refer to the Current Medication list given to you today.  Labwork: Your physician recommends that you return for lab work in: 1 MONTH (BMP)  Testing/Procedures: Your physician has requested that you have an echocardiogram in 4 MONTHS (end of March). Echocardiography is a painless test that uses sound waves to create images of your heart. It provides your doctor with information about the size and shape of your heart and how well your heart's chambers and valves are working. This procedure takes approximately one hour. There are no restrictions for this procedure.  Follow-Up: Your physician recommends that you schedule a follow-up appointment in: 4 MONTHS with Dr [name]   Any Other Special Instructions Will Be Listed Below (If Applicable).     If you need a refill on your cardiac medications before your next appointment, please call your pharmacy.

## 2015-02-02 NOTE — Progress Notes (Signed)
Cardiology Office Note Date:  02/02/2015   ID:  [name] Foss., DOB 01-21-1941, MRN [id number]  PCP:  [name], MD  Cardiologist:  [name], MD    Chief Complaint  Patient presents with  . Shortness of Breath    History of Present Illness: [name] A Rider Lockard. is a 74 y.o. male who presents for follow-up evaluation. He has been followed for coronary disease, rheumatic heart disease, and CHF.   He underwent 6 vessel CABG in 1998. He's undergone multiple PCI procedures, most recently with rotational atherectomy and bare-metal stenting of the native right coronary artery in 2014. 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 He had an uncomplicated postoperative course. Other comorbid medical conditions include longstanding Type 2 diabetes, stage 3 CKD, and chronic diastolic heart failure.  Overall reports no significant changes since his last visit. Continues to have exertional dyspnea with walking. Still able to do some work. No chest pain or pressure. No orthopnea or PND. Leg swelling has been stable. He had a recent skin tear requiring antibiotics (doxycycline). Currently taking lasix 160 mg QAM, 80 mg QPM, and Metolazone every other day. He's not been on other CHF Rx because of symptomatic hypotension.  Past Medical History  Diagnosis Date  . GERD (gastroesophageal reflux disease)   . Hyperlipidemia   . Hypertension   . Diabetes mellitus   . Carotid artery disease ([place])   . Cellulitis and abscess of leg, except foot   . Leg edema   . Right knee sprain   . Cramps, muscle, general   . Low back pain   . Hiatal hernia   . Chills   . Hearing loss   . Nasal congestion   . Cough   . Wheezing   . Generalized headaches   . CAD (coronary artery disease) 11/05/1996    a. CABG x6 by Dr Redmond Pulling - LIMA to Diag+LAD, SVG to OM1+OM2, SVG to  PDA+RPL, b. VG->RCA known to be occluded;  c. 05/2012 PCI/Rota/BMS to m/dRCA w/ 3.5x15 Vision BMS x 2 post-dil to 3.75.  Marland Kitchen Mitral stenosis     a. moderate by echo 04/2012.  Marland Kitchen Chronic diastolic congestive heart failure ([address])     a. 04/2012 Echo: EF 55-60-%  . Aortic stenosis     a. 04/2012 s/p baloon valvuloplasty w/ reduction in gradient from 47 to 27.  . Pulmonary hypertension ([place])     a. 05/2012 RH cath: PA 97/32(54)  . Venous insufficiency (chronic) (peripheral)   . Sleep apnea     no cpap  sleep study >5 yrs  . Shortness of breath   . Anxiety   . Arthritis   . 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 Fracture of humerus, proximal, right, closed 2016    Past Surgical History  Procedure Laterality Date  . Ett cardiolite  12/31/2006    Mild-Mod distal inf/apical ischemia  . Open heart surgery    . Acute or chronic diastolic hf  [id number] - [id number]    RLE cellulitis - HOSP  . Doppler echocardiography  10/11/2008    Mild AS, Mild-Mod MS, Severe LVH  . Le arterial and venous US  10/11/2008    Art clear.  Venous Neg DVT  . Appendectomy  12/25/2010  Dr Redmond Pulling  . Umbilical hernia repair  12/25/2010    Dr Redmond Pulling  . Hernia repair  [id number]    umbilical hernia repair   . Tee without cardioversion  03/20/2012    Procedure: TRANSESOPHAGEAL ECHOCARDIOGRAM (TEE);  Surgeon: [name], MD;  Location: [facility];  Service: Cardiovascular;  Laterality: N/A;  . Cardiac catheterization    . Coronary artery bypass graft    . Transcatheter aortic valve replacement, transfemoral N/A 06/16/2013    Procedure: TRANSCATHETER AORTIC VALVE REPLACEMENT, TRANSFEMORAL;  Surgeon: [name], MD;  Location: [place];  Service: Open Heart Surgery;  Laterality: N/A;  . Intraoperative transesophageal echocardiogram N/A 06/16/2013    Procedure: INTRAOPERATIVE TRANSESOPHAGEAL ECHOCARDIOGRAM;   Surgeon: [name], MD;  Location: [facility] OR;  Service: Open Heart Surgery;  Laterality: N/A;  . Percutaneous coronary rotoblator intervention (pci-r) N/A 05/07/2012    Procedure: PERCUTANEOUS CORONARY ROTOBLATOR INTERVENTION (PCI-R);  Surgeon: [name], MD;  Location: [facility] CATH LAB;  Service: Cardiovascular;  Laterality: N/A;  . Left and right heart catheterization with coronary angiogram N/A 03/20/2013    Procedure: LEFT AND RIGHT HEART CATHETERIZATION WITH CORONARY ANGIOGRAM;  Surgeon: Blane Ohara, MD;  Location: [facility] CATH LAB;  Service: Cardiovascular;  Laterality: N/A;    Current Outpatient Prescriptions  Medication Sig Dispense Refill  . ALPRAZolam (XANAX) 0.25 MG tablet Take 1 tablet (0.25 mg total) by mouth 2 (two) times daily as needed for anxiety. 10 tablet 0  . aspirin EC 81 MG tablet Take 81 mg by mouth daily.    . clopidogrel (PLAVIX) 75 MG tablet TAKE ONE TABLET BY MOUTH EVERY MORNING WITH BREAKFAST 90 tablet 2  . Coenzyme Q10 300 MG CAPS Take 300 mg by mouth daily.     . colchicine 0.6 MG tablet Take 1 tablet (0.6 mg total) by mouth daily as needed (for gout). 20 tablet 0  . doxycycline (VIBRA-TABS) 100 MG tablet Take 1 tablet (100 mg total) by mouth 2 (two) times daily. 20 tablet 0  . ezetimibe (ZETIA) 10 MG tablet TAKE ONE TABLET BY MOUTH EACH EVENING 30 tablet 11  . fish oil-omega-3 fatty acids 1000 MG capsule Take 1 g by mouth 2 (two) times daily.     . furosemide (LASIX) 80 MG tablet TAKE 2 TABLETS BY MOUTH IN THE MORNING AND 1 TABLET IN THE EVENING 90 tablet 3  . Insulin Zinc Human (NOVOLIN L [place]) Inject 70 Units into the skin 3 (three) times daily. SLIDING SCALE    . JANUVIA 100 MG tablet Take 100 mg by mouth at bedtime.     . methocarbamol (ROBAXIN) 500 MG tablet Take 1 tablet (500 mg total) by mouth daily as needed (leg cramps). 30 tablet 1  . metolazone (ZAROXOLYN) 2.5 MG tablet Take 1 tablet (2.5 mg total) by mouth every other day. 30 tablet 4  . NITROSTAT 0.4 MG  SL tablet DISSOLVE 1 TABLET UNDER THE TONGUE FOR CHEST PAIN. MAY REPEAT EVERY 5MINUTES UP TO 3 DOSES. IF NO RELIEF, CALL 911** 25 tablet 1  . NOVOLOG 100 UNIT/ML injection     . pantoprazole (PROTONIX) 40 MG tablet Take 1 tablet (40 mg total) by mouth every morning. 30 tablet 11  . polyethylene glycol powder (GLYCOLAX/MIRALAX) powder Take 17 g by mouth daily as needed (for constipation). 850 g 1  . potassium chloride SA (K-DUR,KLOR-CON) 20 MEQ tablet Take 2 tablets (40 mEq total) by mouth 3 (three) times daily. 180 tablet 3  . Powders (ANTI MONKEY BUTT)  POWD Apply 1 application topically as needed (burning itching drying up). Apply to legs and rub in    . ranitidine (ZANTAC) 150 MG tablet Take 150 mg by mouth 2 (two) times daily.     . repaglinide (PRANDIN) 2 MG tablet Take 2 mg by mouth as directed. Sliding Scale    . sertraline (ZOLOFT) 100 MG tablet Take 1 tablet (100 mg total) by mouth daily. 90 tablet 1  . traMADol (ULTRAM) 50 MG tablet TAKE 1-2 TABLETS BY MOUTH EVERY 12 HOURSAS NEEDED FOR SEVERE PAIN (MAX 4 PILLS PER DAY) 60 tablet 1  . Vitamin D, Ergocalciferol, (DRISDOL) 50000 UNITS CAPS Take 50,000 Units by mouth every 7 (seven) days. Take every week on saturday    . rosuvastatin (CRESTOR) 20 MG tablet Take 1 tablet (20 mg total) by mouth at bedtime. 30 tablet 6  . [DISCONTINUED] promethazine (PHENERGAN) 25 MG tablet Take 1/2 to 1 tablet up to every 6 hours as needed for nausea.      No current facility-administered medications for this visit.    Allergies:   Adhesive and Celecoxib   Social History:  The patient  reports that he has never smoked. He has never used smokeless tobacco. He reports that he does not drink alcohol or use illicit drugs.   Family History:  The patient's  family history includes Diabetes in his father and son; Emphysema in his mother; Heart disease in his father; Other in his son.    ROS:  Please see the history of present illness.  Otherwise, review of  systems is positive for leg pain.  All other systems are reviewed and negative.    PHYSICAL EXAM: VS:  BP 118/62 mmHg  Pulse 80  Ht 5\' 9"  (1.753 m)  Wt 220 lb 12.8 oz (100.154 kg)  BMI 32.59 kg/m2 , BMI Body mass index is 32.59 kg/(m^2). GEN: Well nourished, well developed, in no acute distress HEENT: normal Neck: no JVD, no masses. No carotid bruits Cardiac: RRR with 2/6 systolic murmur at the RUSB, no diastolic murmur              Respiratory:  clear to auscultation bilaterally, normal work of breathing GI: soft, nontender, nondistended, + BS MS: no deformity or atrophy Ext: stockings in place, 2+ right pretibial edema, trace left edema Neuro:  Strength and sensation are intact Psych: euthymic mood, full affect  EKG:  EKG is ordered today. The ekg ordered today shows NSR with first degree AV block, occasional PVC, age-indeterminate inferior infarct  Recent Labs: 06/14/2014: Pro B Natriuretic peptide (BNP) 608.0* 01/25/2015: BUN 42*; Creat 1.83*; Potassium 3.2*; Sodium 133*   Lipid Panel  No results found for: CHOL, TRIG, HDL, CHOLHDL, VLDL, LDLCALC, LDLDIRECT    Wt Readings from Last 3 Encounters:  02/02/15 220 lb 12.8 oz (100.154 kg)  01/31/15 220 lb 4 oz (99.905 kg)  10/29/14 231 lb 12.8 oz (105.144 kg)     Cardiac Studies Reviewed: 2D Echo 06/14/2014: Study Conclusions - Left ventricle: The cavity size was normal. Wall thickness was increased in a pattern of moderate LVH. Systolic function was normal. The estimated ejection fraction was in the range of 55% to 60%. There is hypokinesis of the mid-apicalanteroseptal myocardium. - Ventricular septum: The contour showed diastolic flattening and systolic flattening. - Aortic valve: A bioprosthesis was present. There was mild stenosis. There was mild regurgitation. - Aortic root: The aortic root was mildly dilated. - Mitral valve: Severely calcified annulus. Mobility was restricted. The findings  are  consistent with severe stenosis. - Left atrium: The atrium was severely dilated. - Right ventricle: The cavity size was mildly dilated. Hypertrophy was present. Systolic function was moderately reduced. - Right atrium: The atrium was moderately dilated. - Pulmonary arteries: Systolic pressure was mildly increased. PA peak pressure: 40 mm Hg (S).  Impressions:  - Hypokinesis of the distal septum with overall preserved LV function; biatrial enlargement; mild RVE with moderately reduced function; s/p TAVR (mean gradient 15 mmHg); mild AI; severe MAC with severe MS (mean gradient 18 mmHg); findings of RV dysfunction, flattened ventricular septum suggest pulmonary hypertension; compared to 07/17/13, mean gradient across MV has increased from 14 to 18 mmHg.   ASSESSMENT AND PLAN: 1. Chronic diastolic HF, NYHA III:  He understands limitations of medical therapy in setting of untreatable severe mitral stenosis. Seems to have stabilized on current diuretic regimen. Unable to tolerate aggressive medical therapy for CHF because of hypotension/syncope. He is not interested in further invasive therapies.  2. Valvular heart disease: normal function THV in aortic position. RV pressure and volume overload noted on last echo and this is likely related to severe MS. His valve does not have favorable characteristics for percutaneous BMV. Will repeat 2D Echo prior to next office visit.   3. CAD: appears stable without clear evidence of angina. Severe diffuse distal vessel CAD noted during previous evalaution.  4. CKD, stage 3: recent labs show stability of renal function. Potassium repletion with high-dose diuretics.  Current medicines are reviewed with the patient today.  The patient does not have concerns regarding medicines.  Labs/ tests ordered today include:   Orders Placed This Encounter  Procedures  . Basic Metabolic Panel (BMET)  . EKG 12-Lead  . Echocardiogram     Disposition:   FU 4 months with an echo prior to that visit.  [name] James, MD  02/02/2015 1:34 PM    [facility] [address], [place], [place]  [address] Phone: [phone]; Fax: [phone]

## 2015-02-04 ENCOUNTER — Other Ambulatory Visit: Payer: Self-pay | Admitting: Cardiovascular Disease

## 2015-02-04 ENCOUNTER — Telehealth: Payer: Self-pay | Admitting: Cardiovascular Disease

## 2015-02-04 DIAGNOSIS — E785 Hyperlipidemia, unspecified: Secondary | ICD-10-CM

## 2015-02-04 NOTE — Telephone Encounter (Signed)
New Message  Bee from [facility] on the phone stating that the refills keep getting denied   *STAT* If patient is at the pharmacy, call can be transferred to refill team.   1. Which medications need to be refilled? (please list name of each medication and dose if known)  Vetia 10mg   2. Which pharmacy/location (including street and city if local pharmacy) is medication to be sent to? [address] [place]  3. Do they need a 30 day or 90 day supply?  [place]

## 2015-02-04 NOTE — Telephone Encounter (Signed)
Per epic patient should have refills through April of next year. Spoke with pharmacist at [facility] and was informed that the rx was voided, due to someone from our office authorizing a 90 day supply with no refills in September. I do not see that the patient has had a recent lipid panel. Please advise on refills. Thanks, MI

## 2015-02-07 ENCOUNTER — Other Ambulatory Visit: Payer: Self-pay | Admitting: *Deleted

## 2015-02-07 DIAGNOSIS — I350 Nonrheumatic aortic (valve) stenosis: Secondary | ICD-10-CM

## 2015-02-07 DIAGNOSIS — I5033 Acute on chronic diastolic (congestive) heart failure: Secondary | ICD-10-CM

## 2015-02-07 DIAGNOSIS — R0600 Dyspnea, unspecified: Secondary | ICD-10-CM

## 2015-02-07 MED ORDER — EZETIMIBE 10 MG PO TABS: ORAL_TABLET | ORAL | Status: DC

## 2015-02-07 NOTE — Telephone Encounter (Signed)
Please refill Zetia. The pt has an upcoming appointment for lab and we will add a lipid and liver to this appointment.   Thank you

## 2015-02-08 ENCOUNTER — Other Ambulatory Visit: Payer: Self-pay | Admitting: *Deleted

## 2015-02-08 NOTE — Telephone Encounter (Signed)
Faxed refill request. Last Filled:    60 tablet 1 09/24/2014  Please advise.

## 2015-02-09 MED ORDER — TRAMADOL HCL 50 MG PO TABS: ORAL_TABLET | ORAL | Status: DC

## 2015-02-09 NOTE — Telephone Encounter (Signed)
Rx called to pharmacy as instructed. 

## 2015-02-09 NOTE — Telephone Encounter (Signed)
Please call in.  Thanks.   

## 2015-02-10 ENCOUNTER — Telehealth: Payer: Self-pay | Admitting: Family Medicine

## 2015-02-10 ENCOUNTER — Observation Stay (HOSPITAL_COMMUNITY)
Admission: EM | Admit: 2015-02-10 | Discharge: 2015-02-11 | Disposition: A | Discharge status: Home / Self Care | Payer: Medicare Other | Attending: Cardiovascular Disease | Admitting: Cardiovascular Disease

## 2015-02-10 ENCOUNTER — Encounter (HOSPITAL_COMMUNITY): Payer: Self-pay | Admitting: *Deleted

## 2015-02-10 ENCOUNTER — Emergency Department (HOSPITAL_COMMUNITY): Payer: Medicare Other

## 2015-02-10 DIAGNOSIS — I080 Rheumatic disorders of both mitral and aortic valves: Secondary | ICD-10-CM | POA: Insufficient documentation

## 2015-02-10 DIAGNOSIS — Z794 Long term (current) use of insulin: Secondary | ICD-10-CM | POA: Diagnosis not present

## 2015-02-10 DIAGNOSIS — I5032 Chronic diastolic (congestive) heart failure: Secondary | ICD-10-CM | POA: Insufficient documentation

## 2015-02-10 DIAGNOSIS — E119 Type 2 diabetes mellitus without complications: Secondary | ICD-10-CM | POA: Diagnosis not present

## 2015-02-10 DIAGNOSIS — N183 Chronic kidney disease, stage 3 (moderate): Secondary | ICD-10-CM | POA: Diagnosis not present

## 2015-02-10 DIAGNOSIS — I099 Rheumatic heart disease, unspecified: Secondary | ICD-10-CM | POA: Diagnosis not present

## 2015-02-10 DIAGNOSIS — I350 Nonrheumatic aortic (valve) stenosis: Secondary | ICD-10-CM | POA: Diagnosis not present

## 2015-02-10 DIAGNOSIS — I129 Hypertensive chronic kidney disease with stage 1 through stage 4 chronic kidney disease, or unspecified chronic kidney disease: Secondary | ICD-10-CM | POA: Insufficient documentation

## 2015-02-10 DIAGNOSIS — E1122 Type 2 diabetes mellitus with diabetic chronic kidney disease: Secondary | ICD-10-CM | POA: Diagnosis not present

## 2015-02-10 DIAGNOSIS — I5033 Acute on chronic diastolic (congestive) heart failure: Secondary | ICD-10-CM | POA: Diagnosis not present

## 2015-02-10 DIAGNOSIS — Z7982 Long term (current) use of aspirin: Secondary | ICD-10-CM | POA: Diagnosis not present

## 2015-02-10 DIAGNOSIS — R778 Other specified abnormalities of plasma proteins: Secondary | ICD-10-CM

## 2015-02-10 DIAGNOSIS — I272 Other secondary pulmonary hypertension: Secondary | ICD-10-CM | POA: Insufficient documentation

## 2015-02-10 DIAGNOSIS — Z7902 Long term (current) use of antithrombotics/antiplatelets: Secondary | ICD-10-CM | POA: Diagnosis not present

## 2015-02-10 DIAGNOSIS — I9589 Other hypotension: Secondary | ICD-10-CM | POA: Diagnosis not present

## 2015-02-10 DIAGNOSIS — E785 Hyperlipidemia, unspecified: Secondary | ICD-10-CM | POA: Diagnosis not present

## 2015-02-10 DIAGNOSIS — I200 Unstable angina: Secondary | ICD-10-CM | POA: Diagnosis present

## 2015-02-10 DIAGNOSIS — Z955 Presence of coronary angioplasty implant and graft: Secondary | ICD-10-CM | POA: Diagnosis not present

## 2015-02-10 DIAGNOSIS — Z79899 Other long term (current) drug therapy: Secondary | ICD-10-CM | POA: Diagnosis not present

## 2015-02-10 DIAGNOSIS — R7989 Other specified abnormal findings of blood chemistry: Secondary | ICD-10-CM

## 2015-02-10 DIAGNOSIS — Z9981 Dependence on supplemental oxygen: Secondary | ICD-10-CM | POA: Insufficient documentation

## 2015-02-10 DIAGNOSIS — I2510 Atherosclerotic heart disease of native coronary artery without angina pectoris: Secondary | ICD-10-CM

## 2015-02-10 DIAGNOSIS — I25110 Atherosclerotic heart disease of native coronary artery with unstable angina pectoris: Secondary | ICD-10-CM | POA: Insufficient documentation

## 2015-02-10 DIAGNOSIS — E876 Hypokalemia: Secondary | ICD-10-CM | POA: Insufficient documentation

## 2015-02-10 DIAGNOSIS — R079 Chest pain, unspecified: Secondary | ICD-10-CM

## 2015-02-10 HISTORY — DX: Dependence on supplemental oxygen: Z99.81

## 2015-02-10 LAB — I-STAT TROPONIN, ED: Troponin i, poc: 0.13 ng/mL (ref 0.00–0.08)

## 2015-02-10 LAB — BASIC METABOLIC PANEL
Anion gap: 12 (ref 5–15)
BUN: 44 mg/dL — ABNORMAL HIGH (ref 6–20)
CHLORIDE: 92 mmol/L — AB (ref 101–111)
CO2: 32 mmol/L (ref 22–32)
CREATININE: 1.84 mg/dL — AB (ref 0.61–1.24)
Calcium: 10.1 mg/dL (ref 8.9–10.3)
GFR calc non Af Amer: 35 mL/min — ABNORMAL LOW (ref >60)
GFR, EST AFRICAN AMERICAN: 41 mL/min — AB (ref >60)
GLUCOSE: 179 mg/dL — AB (ref 65–99)
Potassium: 3.4 mmol/L — ABNORMAL LOW (ref 3.5–5.1)
Sodium: 136 mmol/L (ref 135–145)

## 2015-02-10 LAB — GLUCOSE, CAPILLARY
GLUCOSE-CAPILLARY: 151 mg/dL — AB (ref 65–99)
GLUCOSE-CAPILLARY: 140 mg/dL — AB (ref 65–99)

## 2015-02-10 LAB — CBC
HCT: 37.8 % — ABNORMAL LOW (ref 39.0–52.0)
Hemoglobin: 11.7 g/dL — ABNORMAL LOW (ref 13.0–17.0)
MCH: 22.2 pg — AB (ref 26.0–34.0)
MCHC: 31 g/dL (ref 30.0–36.0)
MCV: 71.9 fL — AB (ref 78.0–100.0)
PLATELETS: 210 10*3/uL (ref 150–400)
RBC: 5.26 MIL/uL (ref 4.22–5.81)
RDW: 20.5 % — ABNORMAL HIGH (ref 11.5–15.5)
WBC: 8.9 10*3/uL (ref 4.0–10.5)

## 2015-02-10 LAB — TROPONIN I: Troponin I: 0.17 ng/mL — ABNORMAL HIGH (ref <0.031)

## 2015-02-10 MED ORDER — FUROSEMIDE 80 MG PO TABS
160.0000 mg | ORAL_TABLET | Freq: Two times a day (BID) | ORAL | Status: DC
  Administered 2015-02-10 – 2015-02-11 (×2): 160 mg via ORAL
  Filled 2015-02-10 (×2): qty 2

## 2015-02-10 MED ORDER — OMEGA-3 FATTY ACIDS 1000 MG PO CAPS: 1.0000 g | ORAL_CAPSULE | Freq: Two times a day (BID) | ORAL | Status: DC

## 2015-02-10 MED ORDER — ASPIRIN 81 MG PO CHEW: 324.0000 mg | CHEWABLE_TABLET | Freq: Once | ORAL | Status: DC

## 2015-02-10 MED ORDER — EZETIMIBE 10 MG PO TABS
10.0000 mg | ORAL_TABLET | Freq: Every day | ORAL | Status: DC
  Administered 2015-02-11: 10 mg via ORAL
  Filled 2015-02-10: qty 1

## 2015-02-10 MED ORDER — ENSURE ENLIVE PO LIQD
237.0000 mL | Freq: Two times a day (BID) | ORAL | Status: DC
  Administered 2015-02-11: 237 mL via ORAL

## 2015-02-10 MED ORDER — CLOPIDOGREL BISULFATE 75 MG PO TABS: 75.0000 mg | ORAL_TABLET | Freq: Every day | ORAL | Status: DC

## 2015-02-10 MED ORDER — METHOCARBAMOL 500 MG PO TABS
500.0000 mg | ORAL_TABLET | Freq: Every day | ORAL | Status: DC | PRN
Start: 2015-02-10 — End: 2015-02-11

## 2015-02-10 MED ORDER — POLYETHYLENE GLYCOL 3350 17 G PO PACK: 17.0000 g | PACK | Freq: Every day | ORAL | Status: DC | PRN

## 2015-02-10 MED ORDER — PANTOPRAZOLE SODIUM 40 MG PO TBEC
40.0000 mg | DELAYED_RELEASE_TABLET | Freq: Two times a day (BID) | ORAL | Status: DC
  Administered 2015-02-10 – 2015-02-11 (×2): 40 mg via ORAL
  Filled 2015-02-10 (×2): qty 1

## 2015-02-10 MED ORDER — ONDANSETRON HCL 4 MG/2ML IJ SOLN: 4.0000 mg | Freq: Four times a day (QID) | INTRAMUSCULAR | Status: DC | PRN

## 2015-02-10 MED ORDER — FUROSEMIDE 20 MG PO TABS: 160.0000 mg | ORAL_TABLET | Freq: Every day | ORAL | Status: DC

## 2015-02-10 MED ORDER — INSULIN ASPART 100 UNIT/ML ~~LOC~~ SOLN
0.0000 [IU] | Freq: Three times a day (TID) | SUBCUTANEOUS | Status: DC
  Administered 2015-02-10: 3 [IU] via SUBCUTANEOUS

## 2015-02-10 MED ORDER — GI COCKTAIL ~~LOC~~
30.0000 mL | Freq: Four times a day (QID) | ORAL | Status: DC | PRN
  Administered 2015-02-10 – 2015-02-11 (×2): 30 mL via ORAL
  Filled 2015-02-10 (×3): qty 30

## 2015-02-10 MED ORDER — SERTRALINE HCL 100 MG PO TABS
100.0000 mg | ORAL_TABLET | Freq: Every day | ORAL | Status: DC
  Administered 2015-02-11: 100 mg via ORAL
  Filled 2015-02-10: qty 1

## 2015-02-10 MED ORDER — ALPRAZOLAM 0.25 MG PO TABS: 0.2500 mg | ORAL_TABLET | Freq: Two times a day (BID) | ORAL | Status: DC | PRN

## 2015-02-10 MED ORDER — ASPIRIN EC 81 MG PO TBEC
81.0000 mg | DELAYED_RELEASE_TABLET | Freq: Every day | ORAL | Status: DC
Start: 2015-02-10 — End: 2015-02-11
  Administered 2015-02-11: 81 mg via ORAL
  Filled 2015-02-10: qty 1

## 2015-02-10 MED ORDER — SERTRALINE HCL 100 MG PO TABS: 100.0000 mg | ORAL_TABLET | Freq: Every day | ORAL | Status: DC

## 2015-02-10 MED ORDER — POTASSIUM CHLORIDE CRYS ER 20 MEQ PO TBCR
40.0000 meq | EXTENDED_RELEASE_TABLET | Freq: Three times a day (TID) | ORAL | Status: DC
  Administered 2015-02-10 – 2015-02-11 (×4): 40 meq via ORAL
  Filled 2015-02-10 (×4): qty 2

## 2015-02-10 MED ORDER — TRAMADOL HCL 50 MG PO TABS: 50.0000 mg | ORAL_TABLET | Freq: Four times a day (QID) | ORAL | Status: DC | PRN

## 2015-02-10 MED ORDER — OMEGA-3-ACID ETHYL ESTERS 1 G PO CAPS
1.0000 g | ORAL_CAPSULE | Freq: Two times a day (BID) | ORAL | Status: DC
  Administered 2015-02-10 – 2015-02-11 (×2): 1 g via ORAL
  Filled 2015-02-10 (×2): qty 1

## 2015-02-10 MED ORDER — DOXYCYCLINE HYCLATE 100 MG PO TABS
100.0000 mg | ORAL_TABLET | Freq: Two times a day (BID) | ORAL | Status: DC
  Administered 2015-02-10 – 2015-02-11 (×2): 100 mg via ORAL
  Filled 2015-02-10 (×2): qty 1

## 2015-02-10 MED ORDER — INSULIN ASPART 100 UNIT/ML ~~LOC~~ SOLN: 30.0000 [IU] | Freq: Three times a day (TID) | SUBCUTANEOUS | Status: DC

## 2015-02-10 MED ORDER — ROSUVASTATIN CALCIUM 20 MG PO TABS
20.0000 mg | ORAL_TABLET | Freq: Every day | ORAL | Status: DC
Start: 2015-02-10 — End: 2015-02-11
  Administered 2015-02-10: 20 mg via ORAL
  Filled 2015-02-10: qty 1

## 2015-02-10 MED ORDER — CLOPIDOGREL BISULFATE 75 MG PO TABS
75.0000 mg | ORAL_TABLET | Freq: Every day | ORAL | Status: DC
  Administered 2015-02-11: 75 mg via ORAL
  Filled 2015-02-10: qty 1

## 2015-02-10 MED ORDER — ZOLPIDEM TARTRATE 5 MG PO TABS: 5.0000 mg | ORAL_TABLET | Freq: Every evening | ORAL | Status: DC | PRN

## 2015-02-10 MED ORDER — POLYETHYLENE GLYCOL 3350 17 GM/SCOOP PO POWD
17.0000 g | Freq: Every day | ORAL | Status: DC | PRN
  Filled 2015-02-10: qty 255

## 2015-02-10 MED ORDER — FAMOTIDINE 20 MG PO TABS
20.0000 mg | ORAL_TABLET | Freq: Two times a day (BID) | ORAL | Status: DC
  Administered 2015-02-10 – 2015-02-11 (×2): 20 mg via ORAL
  Filled 2015-02-10 (×2): qty 1

## 2015-02-10 MED ORDER — ACETAMINOPHEN 325 MG PO TABS: 650.0000 mg | ORAL_TABLET | ORAL | Status: DC | PRN

## 2015-02-10 MED ORDER — ENOXAPARIN SODIUM 40 MG/0.4ML ~~LOC~~ SOLN
40.0000 mg | SUBCUTANEOUS | Status: DC
  Administered 2015-02-10: 40 mg via SUBCUTANEOUS
  Filled 2015-02-10: qty 0.4

## 2015-02-10 MED ORDER — REPAGLINIDE 2 MG PO TABS
2.0000 mg | ORAL_TABLET | Freq: Three times a day (TID) | ORAL | Status: DC
  Administered 2015-02-11 (×2): 2 mg via ORAL
  Filled 2015-02-10 (×4): qty 1

## 2015-02-10 MED ORDER — FUROSEMIDE 10 MG/ML IJ SOLN
80.0000 mg | Freq: Once | INTRAMUSCULAR | Status: AC
  Administered 2015-02-10: 80 mg via INTRAVENOUS
  Filled 2015-02-10: qty 8

## 2015-02-10 MED ORDER — COLCHICINE 0.6 MG PO TABS
0.6000 mg | ORAL_TABLET | Freq: Every day | ORAL | Status: DC | PRN
Start: 2015-02-10 — End: 2015-02-11

## 2015-02-10 MED ORDER — LINAGLIPTIN 5 MG PO TABS
5.0000 mg | ORAL_TABLET | Freq: Every day | ORAL | Status: DC
  Administered 2015-02-11: 5 mg via ORAL
  Filled 2015-02-10: qty 1

## 2015-02-10 MED ORDER — INSULIN ASPART 100 UNIT/ML ~~LOC~~ SOLN: 0.0000 [IU] | Freq: Every day | SUBCUTANEOUS | Status: DC

## 2015-02-10 MED ORDER — FAMOTIDINE 20 MG PO TABS: 20.0000 mg | ORAL_TABLET | Freq: Two times a day (BID) | ORAL | Status: DC

## 2015-02-10 MED ORDER — COENZYME Q10 300 MG PO CAPS: 300.0000 mg | ORAL_CAPSULE | Freq: Every day | ORAL | Status: DC

## 2015-02-10 MED ORDER — METOLAZONE 2.5 MG PO TABS: 2.5000 mg | ORAL_TABLET | ORAL | Status: DC

## 2015-02-10 MED ORDER — ANTI MONKEY BUTT EX POWD: 1.0000 "application " | CUTANEOUS | Status: DC | PRN

## 2015-02-10 NOTE — ED Provider Notes (Signed)
CSN: [id number]     Arrival date & time 02/10/15  1239 History   First MD Initiated Contact with Patient 02/10/15 1253     Chief Complaint  Patient presents with  . Chest Pain     (Consider location/radiation/quality/duration/timing/severity/associated sxs/prior Treatment) HPI Comments: Right sided CP Feels like "indigestion",  No SOB/n/diaphoresis Not exertional, does not radiate Has not felt pain like this prior to other caths, however reports felt like typical indigestion, however was more severe, did not improve Missed doses of metolazone while away Tried tums, [place] without relief Felt relief with nitro Was working outside physical work, felt fatigued   Past Medical History  Diagnosis Date  . GERD (gastroesophageal reflux disease)   . Hyperlipidemia   . Hypertension   . Diabetes mellitus   . Carotid artery disease ([place])   . Cellulitis and abscess of leg, except foot   . Leg edema   . Right knee sprain   . Cramps, muscle, general   . Low back pain   . Hiatal hernia   . Chills   . Hearing loss   . Nasal congestion   . Cough   . Wheezing   . Generalized headaches   . CAD (coronary artery disease) 11/05/1996    a. CABG x6 by Dr Redmond Pulling - LIMA to Diag+LAD, SVG to OM1+OM2, SVG to PDA+RPL, b. VG->RCA known to be occluded;  c. 05/2012 PCI/Rota/BMS to m/dRCA w/ 3.5x15 Vision BMS x 2 post-dil to 3.75.  Marland Kitchen Mitral stenosis     a. moderate by echo 04/2012.  Marland Kitchen Chronic diastolic congestive heart failure ([place])     a. 04/2012 Echo: EF 55-60-%  . Aortic stenosis     a. 04/2012 s/p baloon valvuloplasty w/ reduction in gradient from 47 to 27.  . Pulmonary hypertension ([place])     a. 05/2012 RH cath: PA 97/32(54)  . Venous insufficiency (chronic) (peripheral)   . Sleep apnea     no cpap  sleep study >5 yrs  . Shortness of breath   . Anxiety   . Arthritis   . 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 Fracture of humerus, proximal, right, closed 2016  . On home oxygen therapy     "2L 4PM til 8:30AM qd" (02/10/2015)   Past Surgical History  Procedure Laterality Date  . Ett cardiolite  12/31/2006    Mild-Mod distal inf/apical ischemia  . Open heart surgery    . Acute or chronic diastolic hf  123456 - [id number]    RLE cellulitis - HOSP  . Doppler echocardiography  10/11/2008    Mild AS, Mild-Mod MS, Severe LVH  . Le arterial and venous US  10/11/2008    Art clear.  Venous Neg DVT  . Appendectomy  12/25/2010    Dr Redmond Pulling  . Umbilical hernia repair  12/25/2010    Dr Redmond Pulling  . Hernia repair  [id number]    umbilical hernia repair   . Tee without cardioversion  03/20/2012    Procedure: TRANSESOPHAGEAL ECHOCARDIOGRAM (TEE);  Surgeon: [name], MD;  Location: [facility];  Service: Cardiovascular;  Laterality: N/A;  . Cardiac catheterization    . Coronary artery bypass graft    . Transcatheter aortic valve replacement, transfemoral N/A 06/16/2013    Procedure: TRANSCATHETER AORTIC VALVE REPLACEMENT, TRANSFEMORAL;  Surgeon: [name], MD;  Location: [facility];  Service: Open Heart Surgery;  Laterality:  N/A;  . Intraoperative transesophageal echocardiogram N/A 06/16/2013    Procedure: INTRAOPERATIVE TRANSESOPHAGEAL ECHOCARDIOGRAM;  Surgeon: [name], MD;  Location: [facility];  Service: Open Heart Surgery;  Laterality: N/A;  . Percutaneous coronary rotoblator intervention (pci-r) N/A 05/07/2012    Procedure: PERCUTANEOUS CORONARY ROTOBLATOR INTERVENTION (PCI-R);  Surgeon: [name], MD;  Location: [facility] CATH LAB;  Service: Cardiovascular;  Laterality: N/A;  . Left and right heart catheterization with coronary angiogram N/A 03/20/2013    Procedure: LEFT AND RIGHT HEART CATHETERIZATION WITH CORONARY ANGIOGRAM;  Surgeon: Blane Ohara, MD;  Location: [facility] CATH LAB;  Service: Cardiovascular;  Laterality: N/A;   Family History  Problem Relation Age of Onset  .  Diabetes Father   . Emphysema Mother   . Heart disease Father   . Diabetes Son   . Other Son     HEALTHY   Social History  Substance Use Topics  . Smoking status: Never Smoker   . Smokeless tobacco: Never Used  . Alcohol Use: No    Review of Systems  Constitutional: Negative for fever.  HENT: Negative for sore throat.   Eyes: Negative for visual disturbance.  Respiratory: Negative for shortness of breath.   Cardiovascular: Positive for chest pain. Negative for leg swelling (unchanged).  Gastrointestinal: Negative for nausea, vomiting, abdominal pain, diarrhea and constipation.  Genitourinary: Negative for difficulty urinating.  Musculoskeletal: Negative for back pain and neck stiffness.  Skin: Positive for wound (being monitored). Negative for rash.  Neurological: Negative for syncope and headaches.      Allergies  Adhesive and Celecoxib  Home Medications   Prior to Admission medications   Medication Sig Start Date End Date Taking? Authorizing Provider  aspirin EC 81 MG tablet Take 81 mg by mouth daily.   Yes Historical Provider, MD  clopidogrel (PLAVIX) 75 MG tablet TAKE ONE TABLET BY MOUTH EVERY MORNING WITH BREAKFAST Patient taking differently: TAKE 75 MG BY MOUTH EVERY MORNING WITH BREAKFAST 01/20/15  Yes [name], MD  colchicine 0.6 MG tablet Take 1 tablet (0.6 mg total) by mouth daily as needed (for gout). 07/30/14  Yes Tonia Ghent, MD  ezetimibe (ZETIA) 10 MG tablet TAKE ONE TABLET BY MOUTH EACH EVENING Patient taking differently: Take 10 mg by mouth at bedtime.  02/07/15  Yes [name], MD  fish oil-omega-3 fatty acids 1000 MG capsule Take 1 g by mouth 2 (two) times daily.    Yes Historical Provider, MD  furosemide (LASIX) 80 MG tablet TAKE 2 TABLETS BY MOUTH IN THE MORNING AND 1 TABLET IN THE EVENING 12/14/14  Yes [name], MD  JANUVIA 100 MG tablet Take 100 mg by mouth at bedtime.  04/24/12  Yes Historical Provider, MD  methocarbamol (ROBAXIN)  500 MG tablet Take 1 tablet (500 mg total) by mouth daily as needed (leg cramps). 07/07/14  Yes Pleas Koch, NP  metolazone (ZAROXOLYN) 2.5 MG tablet Take 1 tablet (2.5 mg total) by mouth every other day. 10/29/14  Yes [name], MD  NITROSTAT 0.4 MG SL tablet DISSOLVE 1 TABLET UNDER THE TONGUE FOR CHEST PAIN. MAY REPEAT EVERY 5MINUTES UP TO 3 DOSES. IF NO RELIEF, CALL 911** 01/31/15  Yes [name], MD  NOVOLOG 100 UNIT/ML injection Inject 10 Units into the skin 3 (three) times daily with meals. 10 units + meal carb count 10/13/14  Yes Historical Provider, MD  pantoprazole (PROTONIX) 40 MG tablet Take 1 tablet (40 mg total) by mouth every morning. 06/14/14  Yes [name], MD  polyethylene glycol powder (GLYCOLAX/MIRALAX) powder Take 17 g by mouth daily as needed (for constipation). 06/16/14  Yes Tonia Ghent, MD  potassium chloride SA (K-DUR,KLOR-CON) 20 MEQ tablet Take 2 tablets (40 mEq total) by mouth 3 (three) times daily. 01/26/15  Yes [name], MD  Powders (ANTI MONKEY BUTT) POWD Apply 1 application topically as needed (burning itching drying up). Apply to legs and rub in   Yes Historical Provider, MD  ranitidine (ZANTAC) 150 MG tablet Take 150 mg by mouth 2 (two) times daily.  04/11/12  Yes Historical Provider, MD  repaglinide (PRANDIN) 2 MG tablet Take 2 mg by mouth 3 (three) times daily before meals.  03/09/14  Yes Historical Provider, MD  sertraline (ZOLOFT) 100 MG tablet Take 1 tablet (100 mg total) by mouth daily. 10/25/14  Yes Tonia Ghent, MD  TOUJEO SOLOSTAR 300 UNIT/ML SOPN Inject 60-70 Units into the skin 3 (three) times daily with meals. 02/04/15  Yes Historical Provider, MD  traMADol (ULTRAM) 50 MG tablet TAKE 1-2 TABLETS BY MOUTH EVERY 12 HOURS AS NEEDED FOR SEVERE PAIN (MAX 4 PILLS PER DAY) Patient taking differently: Take 50-100 mg by mouth every 12 (twelve) hours as needed for moderate pain.  02/09/15  Yes Tonia Ghent, MD  Vitamin D, Ergocalciferol,  (DRISDOL) 50000 UNITS CAPS Take 50,000 Units by mouth every 7 (seven) days. Take every week on saturday   Yes Historical Provider, MD  ALPRAZolam (XANAX) 0.25 MG tablet Take 1 tablet (0.25 mg total) by mouth 2 (two) times daily as needed for anxiety. Patient not taking: Reported on 02/10/2015 07/17/13   [name], MD  doxycycline (VIBRA-TABS) 100 MG tablet Take 1 tablet (100 mg total) by mouth 2 (two) times daily. Patient not taking: Reported on 02/10/2015 01/31/15   Tonia Ghent, MD  rosuvastatin (CRESTOR) 20 MG tablet Take 1 tablet (20 mg total) by mouth at bedtime. 01/24/12 01/31/15  Renella Cunas, MD   BP 112/53 mmHg  Pulse 65  Temp(Src) 97.5 F (36.4 C) (Oral)  Resp 18  Ht 5\' 9"  (1.753 m)  Wt 209 lb 3.2 oz (94.892 kg)  BMI 30.88 kg/m2  SpO2 98% Physical Exam  Constitutional: He is oriented to person, place, and time. He appears well-developed and well-nourished. No distress.  HENT:  Head: Normocephalic and atraumatic.  Eyes: Conjunctivae and EOM are normal.  Neck: Normal range of motion.  Cardiovascular: Normal rate, regular rhythm, normal heart sounds and intact distal pulses.  Exam reveals no gallop and no friction rub.   No murmur heard. Pulmonary/Chest: Effort normal and breath sounds normal. No respiratory distress. He has no wheezes. He has no rales.  Abdominal: Soft. He exhibits no distension. There is no tenderness. There is no guarding.  Musculoskeletal: He exhibits no edema.  Neurological: He is alert and oriented to person, place, and time.  Skin: Skin is warm and dry. He is not diaphoretic.  Erythema bilateral toes Left lower leg with wound, vaseline dressing in place, no clear surrounding erythema   Nursing note and vitals reviewed.   ED Course  Procedures (including critical care time) Labs Review Labs Reviewed  BASIC METABOLIC PANEL - Abnormal; Notable for the following:    Potassium 3.4 (*)    Chloride 92 (*)    Glucose, Bld 179 (*)    BUN 44 (*)     Creatinine, Ser 1.84 (*)    GFR calc non Af Amer 35 (*)    GFR calc Af Amer 41 (*)  All other components within normal limits  CBC - Abnormal; Notable for the following:    Hemoglobin 11.7 (*)    HCT 37.8 (*)    MCV 71.9 (*)    MCH 22.2 (*)    RDW 20.5 (*)    All other components within normal limits  TROPONIN I - Abnormal; Notable for the following:    Troponin I 0.17 (*)    All other components within normal limits  GLUCOSE, CAPILLARY - Abnormal; Notable for the following:    Glucose-Capillary 151 (*)    All other components within normal limits  GLUCOSE, CAPILLARY - Abnormal; Notable for the following:    Glucose-Capillary 140 (*)    All other components within normal limits  I-STAT TROPOININ, ED - Abnormal; Notable for the following:    Troponin i, poc 0.13 (*)    All other components within normal limits  TROPONIN I  TROPONIN I  BASIC METABOLIC PANEL    Imaging Review Dg Chest 2 View  02/10/2015  CLINICAL DATA:  Chest pain EXAM: CHEST  2 VIEW COMPARISON:  07/06/2013 FINDINGS: CABG.  Aortic valve replacement.  Cardiac enlargement. Mild vascular congestion. Negative for edema or effusion. Negative for pneumonia. IMPRESSION: Pulmonary vascular congestion without edema. Electronically Signed   By: Franchot Gallo M.D.   On: 02/10/2015 13:39   I have personally reviewed and evaluated these images and lab results as part of my medical decision-making.   EKG Interpretation   Date/Time:  Thursday February 10 2015 12:41:09 EST Ventricular Rate:  61 PR Interval:  322 QRS Duration: 114 QT Interval:  488 QTC Calculation: 492 R Axis:   152 Text Interpretation:  Sinus rhythm Prolonged PR interval Nonspecific  intra-ventricular conduction delay Inferior infarct, old No significant  change since last tracing Confirmed by [facility] MD, Crista Nuon ([id number]) on  02/10/2015 2:14:22 PM      MDM   Final diagnoses:  Chest pain, unspecified chest pain type  Unstable angina (HCC)   Elevated troponin   A 74 year old male with a history of runny artery disease status post CABG, air stenosis, mitral stenosis, chronic diastolic congestive heart failure, pulmonary hypertension, diabetes, hyperlipidemia, hypertension presents with concern of chest pain.  No dyspnea, no hypoxia, recent trip in car 6hr however got up multiple times and do not feel hx is consistent with PE.  No sign of dissection.  No significant cellulitis of lower ext, chronic wounds/venous stasis.  EKG unchanged from prior, troponin positive at .13. ASA given. Cardiology consulted for further care and pt to be admitted for NSTEMI vs unstable angina vs CHF/AS with strain.     Gareth Morgan, MD 02/10/15 2303

## 2015-02-10 NOTE — Telephone Encounter (Signed)
Ill await ER notes.  Thanks.

## 2015-02-10 NOTE — ED Notes (Signed)
Pt presents via [facility] from home c/o right sided chest pain x 2-3 days.  Pt reports pain as nagging and burning sensation. Pt reports drinking sprite yesterday and the pain was relieved.  Pt woke up and began having this pain again, took 1 NTG with relief of pain.  Pt has extensive cardiac hx, multiple stents, CABG.  EKG 1 degree block HR 40-50s, BP-150/69 O2-100% RA, wears O2 PRN at home.  CBG-248. Pt a x 4, NAD.

## 2015-02-10 NOTE — ED Notes (Signed)
Shown istat i  tropion to [employer]

## 2015-02-10 NOTE — ED Notes (Signed)
MD at bedside. 

## 2015-02-10 NOTE — Telephone Encounter (Signed)
Patient Name: TIMON FOGLIA  DOB: Jun 06, 1940    Initial Comment Caller states husband is having discomfort in his chest; he took pepto and took a Nitro tablet;    Nurse Assessment  Nurse: Raphael Gibney, RN, Vera Date/Time (Eastern Time): 02/10/2015 11:18:15 AM  Confirm and document reason for call. If symptomatic, describe symptoms. ---Caller states spouse is having right sided chest pain. Has taken Nitro and has taken Pepto Bismol. He looks pale. Had episode of chest pain yesterday. Has had chest pain off and on since yesterday. He gets SOB with exertion.  Has the patient traveled out of the country within the last 30 days? ---Not Applicable  Does the patient have any new or worsening symptoms? ---Yes  Will a triage be completed? ---Yes  Related visit to physician within the last 2 weeks? ---No  Does the PT have any chronic conditions? (i.e. diabetes, asthma, etc.) ---Yes  List chronic conditions. ---diabetes; CABG, mitral valve prolapse, pulmonary HTN  Is this a behavioral health or substance abuse call? ---No     Guidelines    Guideline Title Affirmed Question Affirmed Notes  Chest Pain [1] Chest pain lasts > 5 minutes AND [2] history of heart disease (i.e., heart attack, bypass surgery, angina, angioplasty, CHF; not just a heart murmur)    Final Disposition User   Call [facility] Raphael Gibney, RN, Vera    Comments  Caller states she is not sure if pt will let her call 911 but she will ask him.  Called back line and spoke to [facility] and gave report that pt is having right sided chest pain, with SOB on exertion and was advised to call 911. On follow up call, spouse states he is considering it.   Disagree/Comply: Comply

## 2015-02-10 NOTE — Progress Notes (Signed)
Pt a/o, no c/o pain, VSS, pt stable, pt resting comfortably 

## 2015-02-10 NOTE — Telephone Encounter (Signed)
wife called and talked to team health. Pt is having right side chest pain. TH advised him to call 911 and pt is thinking about it. TH will be sending over report momentarily. Thank you

## 2015-02-10 NOTE — Telephone Encounter (Signed)
Spoke with family member and pt is on way to [facility] ED now.

## 2015-02-10 NOTE — H&P (Signed)
History and Physical  Patient ID: [name] [name]. MRN: [id number], SOB: May 19, 1940 74 y.o. Date of Encounter: 02/10/2015, 2:38 PM  Primary Physician: [name], MD Primary Cardiologist: [name]  Chief Complaint: Chest pain  HPI: 74 y.o. male w/ PMHx significant for advanced coronary and valvular heart disease who presented to [facility] on 02/10/2015 with complaints of chest pain.  He has been followed for coronary disease s/p CABG and PCI, rheumatic heart disease, and CHF.   He underwent 6 vessel CABG in 1998. He's undergone multiple PCI procedures, most recently with rotational atherectomy and bare-metal stenting of the native right coronary artery in 2014. 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 He had an uncomplicated postoperative course. Other comorbid medical conditions include longstanding Type 2 diabetes, stage 3 CKD, and chronic diastolic heart failure.  The patient was just recently seen in the office last week. He continues to struggle with advanced heart failure. He realized that over a 4 day period he forgot to take metolazone which he normally takes every other day. With this he felt increasingly weak and short of breath. He took a double dose and began to feel better. He traveled over the last 48 hours and did some physical work. Overnight he experienced "indigestion" with a pressure-like sensation over the right chest. He has been eating less and less. He continues to have a slight feeling of indigestion now. His breathing is unchanged and he does not feel short of breath at rest. No change in chronic leg swelling and he denies orthopnea or PND. He became concerned about ongoing indigestion and thought this may be cardiac related. He presents to the emergency department and is noted to have an elevated troponin of 0.13.  His wife is at the bedside. They are both very well known to me from frequent outpatient visits.  Past Medical History  Diagnosis Date  . GERD (gastroesophageal reflux disease)   . Hyperlipidemia   . Hypertension   . Diabetes mellitus   . Carotid artery disease ([place])   . Cellulitis and abscess of leg, except foot   . Leg edema   . Right knee sprain   . Cramps, muscle, general   . Low back pain   . Hiatal hernia   . Chills   . Hearing loss   . Nasal congestion   . Cough   . Wheezing   . Generalized headaches   . CAD (coronary artery disease) 11/05/1996    a. CABG x6 by Dr Redmond Pulling - LIMA to Diag+LAD, SVG to OM1+OM2, SVG to PDA+RPL, b. VG->RCA known to be occluded;  c. 05/2012 PCI/Rota/BMS to m/dRCA w/ 3.5x15 Vision BMS x 2 post-dil to 3.75.  Marland Kitchen Mitral stenosis     a. moderate by echo 04/2012.  Marland Kitchen Chronic diastolic congestive heart failure ([place])     a. 04/2012 Echo: EF 55-60-%  . Aortic stenosis     a. 04/2012 s/p baloon valvuloplasty w/ reduction in gradient from 47 to 27.  . Pulmonary hypertension ([place])     a. 05/2012 RH cath: PA 97/32(54)  . Venous insufficiency (chronic) (peripheral)   . Sleep apnea     no cpap  sleep study >5 yrs  . Shortness of breath   . Anxiety   . Arthritis   . 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 Fracture of humerus, proximal, right, closed 2016     Surgical History:  Past Surgical History  Procedure Laterality Date  . Ett cardiolite  12/31/2006    Mild-Mod distal inf/apical ischemia  . Open heart surgery    . Acute or chronic diastolic hf  [id number] - [id number]    RLE cellulitis - HOSP  . Doppler echocardiography  10/11/2008    Mild AS, Mild-Mod MS, Severe LVH  . Le arterial and venous US  10/11/2008    Art clear.  Venous Neg DVT  . Appendectomy  12/25/2010    Dr Redmond Pulling  . Umbilical hernia repair  12/25/2010    Dr Redmond Pulling  . Hernia  repair  [id number]    umbilical hernia repair   . Tee without cardioversion  03/20/2012    Procedure: TRANSESOPHAGEAL ECHOCARDIOGRAM (TEE);  Surgeon: [name], MD;  Location: [place];  Service: Cardiovascular;  Laterality: N/A;  . Cardiac catheterization    . Coronary artery bypass graft    . Transcatheter aortic valve replacement, transfemoral N/A 06/16/2013    Procedure: TRANSCATHETER AORTIC VALVE REPLACEMENT, TRANSFEMORAL;  Surgeon: [name], MD;  Location: [place];  Service: Open Heart Surgery;  Laterality: N/A;  . Intraoperative transesophageal echocardiogram N/A 06/16/2013    Procedure: INTRAOPERATIVE TRANSESOPHAGEAL ECHOCARDIOGRAM;  Surgeon: [name], MD;  Location: [facility] OR;  Service: Open Heart Surgery;  Laterality: N/A;  . Percutaneous coronary rotoblator intervention (pci-r) N/A 05/07/2012    Procedure: PERCUTANEOUS CORONARY ROTOBLATOR INTERVENTION (PCI-R);  Surgeon: [name], MD;  Location: [facility] CATH LAB;  Service: Cardiovascular;  Laterality: N/A;  . Left and right heart catheterization with coronary angiogram N/A 03/20/2013    Procedure: LEFT AND RIGHT HEART CATHETERIZATION WITH CORONARY ANGIOGRAM;  Surgeon: Blane Ohara, MD;  Location: [facility] CATH LAB;  Service: Cardiovascular;  Laterality: N/A;     Home Meds: Prior to Admission medications   Medication Sig Start Date End Date Taking? Authorizing Provider  ALPRAZolam (XANAX) 0.25 MG tablet Take 1 tablet (0.25 mg total) by mouth 2 (two) times daily as needed for anxiety. 07/17/13   [name], MD  aspirin EC 81 MG tablet Take 81 mg by mouth daily.    Historical Provider, MD  clopidogrel (PLAVIX) 75 MG tablet TAKE ONE TABLET BY MOUTH EVERY MORNING WITH BREAKFAST 01/20/15   [name], MD  Coenzyme Q10 300 MG CAPS Take 300 mg by mouth daily.     Historical Provider, MD  colchicine 0.6 MG tablet Take 1 tablet (0.6 mg total) by mouth daily as needed (for gout). 07/30/14   Tonia Ghent, MD  doxycycline  (VIBRA-TABS) 100 MG tablet Take 1 tablet (100 mg total) by mouth 2 (two) times daily. 01/31/15   Tonia Ghent, MD  ezetimibe (ZETIA) 10 MG tablet TAKE ONE TABLET BY MOUTH EACH EVENING 02/07/15   [name], MD  fish oil-omega-3 fatty acids 1000 MG capsule Take 1 g by mouth 2 (two) times daily.     Historical Provider, MD  furosemide (LASIX) 80 MG tablet TAKE 2 TABLETS BY MOUTH IN THE MORNING AND 1 TABLET IN THE EVENING 12/14/14   [name], MD  Insulin Zinc Human (NOVOLIN L [place]) Inject 70 Units into the skin 3 (three) times daily. SLIDING SCALE    Historical Provider, MD  JANUVIA 100 MG tablet Take 100 mg by mouth at bedtime.  04/24/12   Historical Provider, MD  methocarbamol (ROBAXIN)  500 MG tablet Take 1 tablet (500 mg total) by mouth daily as needed (leg cramps). 07/07/14   Pleas Koch, NP  metolazone (ZAROXOLYN) 2.5 MG tablet Take 1 tablet (2.5 mg total) by mouth every other day. 10/29/14   [name], MD  NITROSTAT 0.4 MG SL tablet DISSOLVE 1 TABLET UNDER THE TONGUE FOR CHEST PAIN. MAY REPEAT EVERY 5MINUTES UP TO 3 DOSES. IF NO RELIEF, CALL 911** 01/31/15   [name], MD  NOVOLOG 100 UNIT/ML injection  10/13/14   Historical Provider, MD  pantoprazole (PROTONIX) 40 MG tablet Take 1 tablet (40 mg total) by mouth every morning. 06/14/14   [name], MD  polyethylene glycol powder (GLYCOLAX/MIRALAX) powder Take 17 g by mouth daily as needed (for constipation). 06/16/14   Tonia Ghent, MD  potassium chloride SA (K-DUR,KLOR-CON) 20 MEQ tablet Take 2 tablets (40 mEq total) by mouth 3 (three) times daily. 01/26/15   [name], MD  Powders (ANTI MONKEY BUTT) POWD Apply 1 application topically as needed (burning itching drying up). Apply to legs and rub in    Historical Provider, MD  ranitidine (ZANTAC) 150 MG tablet Take 150 mg by mouth 2 (two) times daily.  04/11/12   Historical Provider, MD  repaglinide (PRANDIN) 2 MG tablet Take 2 mg by mouth as directed. Sliding Scale  03/09/14   Historical Provider, MD  rosuvastatin (CRESTOR) 20 MG tablet Take 1 tablet (20 mg total) by mouth at bedtime. 01/24/12 01/31/15  Renella Cunas, MD  sertraline (ZOLOFT) 100 MG tablet Take 1 tablet (100 mg total) by mouth daily. 10/25/14   Tonia Ghent, MD  traMADol (ULTRAM) 50 MG tablet TAKE 1-2 TABLETS BY MOUTH EVERY 12 HOURS AS NEEDED FOR SEVERE PAIN (MAX 4 PILLS PER DAY) 02/09/15   Tonia Ghent, MD  Vitamin D, Ergocalciferol, (DRISDOL) 50000 UNITS CAPS Take 50,000 Units by mouth every 7 (seven) days. Take every week on saturday    Historical Provider, MD    Allergies:  Allergies  Allergen Reactions  . Adhesive [Tape] Rash    blistered  . Celecoxib Rash    Social History   Social History  . Marital Status: Married    Spouse Name: N/A  . Number of Children: N/A  . Years of Education: N/A   Occupational History  . Not on file.   Social History Main Topics  . Smoking status: Never Smoker   . Smokeless tobacco: Never Used  . Alcohol Use: No  . Drug Use: No  . Sexual Activity: Not on file   Other Topics Concern  . Not on file   Social History Narrative     Family History  Problem Relation Age of Onset  . Diabetes Father   . Emphysema Mother   . Heart disease Father   . Diabetes Son   . Other Son     HEALTHY   Review of Systems: General: negative for chills, fever, night sweats or weight changes.  ENT: negative for rhinorrhea or epistaxis Cardiovascular: see HPI Dermatological: positive for chronic leg rash Respiratory: negative for cough or wheezing, positive for shortness of breath GI: negative for nausea, vomiting, diarrhea, bright red blood per rectum, melena, or hematemesis, positive for heartburn GU: no hematuria, urgency, or frequency Neurologic: negative for visual changes, syncope, headache. Positive for dizziness Heme: no easy bruising or bleeding Endo: negative for excessive thirst, thyroid disorder, or flushing Musculoskeletal: negative  for joint pain or swelling, negative for myalgias All other systems reviewed  and are otherwise negative except as noted above.  Physical Exam: Blood pressure 116/55, pulse 55, temperature 97.8 F (36.6 C), temperature source Oral, resp. rate 14, SpO2 95 %. General: chronically ill-appearing male, alert and oriented, in no acute distress. HEENT: Normocephalic, atraumatic, sclera anicteric Neck: Supple. Carotids 2+ without bruits. JVP moderately elevated Lungs: Diminished bilaterally without rales Heart: RRR with normal S1 and S2. Distant heart sounds, no murmur appreciated today. Abdomen: Soft, non-tender, non-distended with normoactive bowel sounds. No hepatomegaly. No rebound/guarding. No obvious abdominal masses. Back: No CVA tenderness Msk:  Strength and tone appear normal for age. Extremities: 1+ pretibial edema bilaterally. Legs wrapped in bandages.   Neuro: CNII-XII intact, moves all extremities spontaneously. Psych:  Responds to questions appropriately with a normal affect. Skin: warm and dry    Labs:   Lab Results  Component Value Date   WBC 8.9 02/10/2015   HGB 11.7* 02/10/2015   HCT 37.8* 02/10/2015   MCV 71.9* 02/10/2015   PLT 210 02/10/2015    Recent Labs Lab 02/10/15 1250  NA 136  K 3.4*  CL 92*  CO2 32  BUN 44*  CREATININE 1.84*  CALCIUM 10.1  GLUCOSE 179*   No results for input(s): CKTOTAL, CKMB, TROPONINI in the last 72 hours. No results found for: CHOL, HDL, LDLCALC, TRIG No results found for: DDIMER  Radiology/Studies:  Dg Chest 2 View  02/10/2015  CLINICAL DATA:  Chest pain EXAM: CHEST  2 VIEW COMPARISON:  07/06/2013 FINDINGS: CABG.  Aortic valve replacement.  Cardiac enlargement. Mild vascular congestion. Negative for edema or effusion. Negative for pneumonia. IMPRESSION: Pulmonary vascular congestion without edema. Electronically Signed   By: Franchot Gallo M.D.   On: 02/10/2015 13:39     EKG: Sinus rhythm, RVH with strain, unchanged from  baseline  CARDIAC STUDIES: 2D Echo 06/14/2014: Study Conclusions  - Left ventricle: The cavity size was normal. Wall thickness was increased in a pattern of moderate LVH. Systolic function was normal. The estimated ejection fraction was in the range of 55% to 60%. There is hypokinesis of the mid-apicalanteroseptal myocardium. - Ventricular septum: The contour showed diastolic flattening and systolic flattening. - Aortic valve: A bioprosthesis was present. There was mild stenosis. There was mild regurgitation. - Aortic root: The aortic root was mildly dilated. - Mitral valve: Severely calcified annulus. Mobility was restricted. The findings are consistent with severe stenosis. - Left atrium: The atrium was severely dilated. - Right ventricle: The cavity size was mildly dilated. Hypertrophy was present. Systolic function was moderately reduced. - Right atrium: The atrium was moderately dilated. - Pulmonary arteries: Systolic pressure was mildly increased. PA peak pressure: 40 mm Hg (S).  Impressions:  - Hypokinesis of the distal septum with overall preserved LV function; biatrial enlargement; mild RVE with moderately reduced function; s/p TAVR (mean gradient 15 mmHg); mild AI; severe MAC with severe MS (mean gradient 18 mmHg); findings of RV dysfunction, flattened ventricular septum suggest pulmonary hypertension; compared to 07/17/13, mean gradient across MV has increased from 14 to 18 mmHg.  ASSESSMENT AND PLAN:  1. Acute on chronic diastolic heart failure, New [place] Heart Association class IV: The patient has some evidence of volume overload on exam. Will give a dose of IV Lasix tonight. Will continue his home regimen of metolazone and Lasix beginning tomorrow. Will update an echocardiogram.  2. Rheumatic heart disease: The patient is status post TAVR with normal function of his transcatheter heart valve. He has severe mitral stenosis with advanced  rheumatic changes  and calcification. He is not a candidate for further invasive therapies related to his valvular disease, nor does he wish to pursue these. Will continue with medical therapy. He understands limited options at this point.  3. Coronary artery disease with resting angina. I suspect his "indigestion" is cardiac related. It may be that he has elevated filling pressures and diastolic dysfunction causing troponin elevation versus progressive coronary obstruction. At baseline he has extensive small vessel CAD. Will add isosorbide 15 mg daily. Aggressive medical therapy has been difficult because of chronic hypotension. Will continue aspirin and clopidogrel. Likely will avoid invasive evaluation or cardiac catheterization unless there is significant troponin elevation.  4. CKD stage III: Stable renal indices with creatinine 1.8 mg/dL.  5. Type 2 diabetes, K to by chronic kidney disease, extensive vascular disease. Will place on sliding scale insulin while hospitalized. Continue home insulin regimen.  Deatra James MD 02/10/2015, 2:38 PM

## 2015-02-11 ENCOUNTER — Observation Stay (HOSPITAL_BASED_OUTPATIENT_CLINIC_OR_DEPARTMENT_OTHER): Payer: Medicare Other

## 2015-02-11 DIAGNOSIS — R079 Chest pain, unspecified: Secondary | ICD-10-CM

## 2015-02-11 DIAGNOSIS — I5033 Acute on chronic diastolic (congestive) heart failure: Secondary | ICD-10-CM | POA: Diagnosis not present

## 2015-02-11 LAB — TROPONIN I
TROPONIN I: 0.17 ng/mL — AB (ref <0.031)
Troponin I: 0.2 ng/mL — ABNORMAL HIGH (ref <0.031)

## 2015-02-11 LAB — BASIC METABOLIC PANEL
Anion gap: 11 (ref 5–15)
BUN: 43 mg/dL — AB (ref 6–20)
CO2: 30 mmol/L (ref 22–32)
Calcium: 9.4 mg/dL (ref 8.9–10.3)
Chloride: 94 mmol/L — ABNORMAL LOW (ref 101–111)
Creatinine, Ser: 1.79 mg/dL — ABNORMAL HIGH (ref 0.61–1.24)
GFR, EST AFRICAN AMERICAN: 42 mL/min — AB (ref >60)
GFR, EST NON AFRICAN AMERICAN: 36 mL/min — AB (ref >60)
Glucose, Bld: 199 mg/dL — ABNORMAL HIGH (ref 65–99)
POTASSIUM: 2.7 mmol/L — AB (ref 3.5–5.1)
SODIUM: 135 mmol/L (ref 135–145)

## 2015-02-11 LAB — POTASSIUM: Potassium: 3.1 mmol/L — ABNORMAL LOW (ref 3.5–5.1)

## 2015-02-11 LAB — GLUCOSE, CAPILLARY
GLUCOSE-CAPILLARY: 150 mg/dL — AB (ref 65–99)
GLUCOSE-CAPILLARY: 172 mg/dL — AB (ref 65–99)

## 2015-02-11 MED ORDER — ISOSORBIDE MONONITRATE ER 30 MG PO TB24: 15.0000 mg | ORAL_TABLET | Freq: Every day | ORAL | Status: DC

## 2015-02-11 MED ORDER — POTASSIUM CHLORIDE CRYS ER 20 MEQ PO TBCR
40.0000 meq | EXTENDED_RELEASE_TABLET | ORAL | Status: AC
  Administered 2015-02-11 (×2): 40 meq via ORAL
  Filled 2015-02-11 (×2): qty 2

## 2015-02-11 MED ORDER — INSULIN ASPART 100 UNIT/ML ~~LOC~~ SOLN
0.0000 [IU] | Freq: Three times a day (TID) | SUBCUTANEOUS | Status: DC
  Administered 2015-02-11: 2 [IU] via SUBCUTANEOUS
  Administered 2015-02-11: 3 [IU] via SUBCUTANEOUS

## 2015-02-11 MED ORDER — PERFLUTREN LIPID MICROSPHERE
1.0000 mL | INTRAVENOUS | Status: AC | PRN
  Administered 2015-02-11: 2 mL via INTRAVENOUS
  Filled 2015-02-11: qty 10

## 2015-02-11 NOTE — Care Management Obs Status (Signed)
[place] NOTIFICATION   Patient Details  Name: [name] [name]. MRN: [id number] Date of Birth: 1940-10-19   Medicare Observation Status Notification Given:  Yes Explained Observation Status,  given copy at bedside. No voiced questions.     Delrae Sawyers, RN 02/11/2015, 2:03 PM

## 2015-02-11 NOTE — Discharge Summary (Signed)
Physician Discharge Summary   Cardiologist:   [name]  Patient ID: [name] [name]. MRN: [id number] DOB/AGE: 1940-09-18 74 y.o.  Admit date: 02/10/2015 Discharge date: 02/11/2015  Admission Diagnoses:  Acute on chronic diastolic congestive heart failure  Discharge Diagnoses:  Active Problems:   Acute on chronic diastolic congestive heart failure, NYHA class 4 (HCC)   Unstable angina (HCC)   Hypokalemia   Rheumatic heart disease   Coronary artery disease   Chronic kidney disease stage III  Diabetes mellitus type 2  Discharged Condition: stable  Hospital Course:   74 y.o. male w/ PMHx significant for advanced coronary and valvular heart disease who presented to [facility] on 02/10/2015 with complaints of chest pain.  He has been followed for coronary disease s/p CABG and PCI, rheumatic heart disease, and CHF.   He underwent 6 vessel CABG in 1998. He's undergone multiple PCI procedures, most recently with rotational atherectomy and bare-metal stenting of the native right coronary artery in 2014. 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 He had an uncomplicated postoperative course. Other comorbid medical conditions include longstanding Type 2 diabetes, stage 3 CKD, and chronic diastolic heart failure.  The patient was just recently seen in the office last week. He continues to struggle with advanced heart failure. He realized that over a 4 day period he forgot to take metolazone which he normally takes every other day. With this he felt increasingly weak and short of breath. He took a double dose and began to feel better. He traveled over the last 48 hours and did some physical work. Overnight he experienced "indigestion" with a pressure-like sensation over the right chest. He has been eating less and less. He continues to have a slight  feeling of indigestion now. His breathing is unchanged and he does not feel short of breath at rest. No change in chronic leg swelling and he denies orthopnea or PND. He became concerned about ongoing indigestion and thought this may be cardiac related. He presents to the emergency department and is noted to have an elevated troponin of 0.13. His wife is at the bedside. They are both very well known to me from frequent outpatient visits.  Patient was admitted for observation. He was given a dose of IV Lasix and then resumed on home metolazone and by mouth Lasix the following day.  He diuresed 1 L.  He was hypokalemic and given supplemental potassium. Potassium level from 2.7 to 3.1.  An echocardiogram which revealed ejection fraction of 0000000 grade 2 diastolic dysfunction. Mitral valve was severely thickened with severely calcified leaflets. There was severe stenosis. Mild regurgitation. Mean gradient 20 mmHg the peak 31 mmHg. Was no significant change compared to the prior study from April 2016. He was started on 15 mg of isosorbide. The patient was seen by Dr. [name] who felt he was stable for DC home.  Recheck basic metabolic panel at office visit   Consults: None  Significant Diagnostic Studies:  Echocardiogram , Study Conclusions  - Left ventricle: Apical septal, apical inferior and apex are akinetic. No thombus is seen. The cavity size was normal. There was severe concentric hypertrophy. Systolic function was normal. The estimated ejection fraction was in the range of 55% to 60%. Wall motion was normal; there were no regional wall motion abnormalities. Features are consistent with a pseudonormal left ventricular filling pattern, with concomitant abnormal relaxation  and increased filling pressure (grade 2 diastolic dysfunction). Doppler parameters are consistent with elevated ventricular end-diastolic filling pressure. - Ventricular septum: The contour showed diastolic  flattening and systolic flattening. - Aortic valve: A bioprosthetic valve sits well in the aortic position. Transortic gradients are normal and unchanged from prior. There is trivila central aortic regurgitation and no paravalvular leak. There was trivial regurgitation. Mean gradient (S): 9 mm Hg. Peak gradient (S): 18 mm Hg. Valve area (VTI): 2.32 cm^2. Valve area (Vmax): 1.97 cm^2. Valve area (Vmean): 2.11 cm^2. - Aortic root: The aortic root was normal in size. - Ascending aorta: The ascending aorta was normal in size. - Mitral valve: Calcified annulus. Severely thickened, severely calcified leaflets . The findings are consistent with severe stenosis. There was mild regurgitation. Mean gradient (D): 20 mm Hg. Peak gradient 31 mmHg. Valve area by pressure half-time: 1.58 cm^2. Valve area by continuity equation (using LVOT flow): 0.74 cm^2. - Left atrium: The atrium was severely dilated. - Right ventricle: The cavity size was moderately dilated. Wall thickness was normal. Systolic function was moderately reduced.  Impressions:  - There is no significant difference when compared to the prior study from 06/2014, regional wall motion in the apex were present on the prior study as well. Overall LVEF preserved at 55-60%. RV remains moderately dilated with moderately reduced systolic function.   Treatments:  See above  Discharge Exam: Blood pressure 114/54, pulse 62, temperature 97.8 F (36.6 C), temperature source Oral, resp. rate 18, height 5\' 9"  (1.753 m), weight 209 lb 8 oz (95.029 kg), SpO2 93 %.   Disposition: 01-Home or Self Care      Discharge Instructions    Diet - low sodium heart healthy    Complete by:  As directed      Discharge instructions    Complete by:  As directed   Monitor your weight every morning.  If you gain 3 pounds in 24 hours, or 5 pounds in a week, call the office for instructions.     Increase activity slowly     Complete by:  As directed             Medication List    TAKE these medications        ALPRAZolam 0.25 MG tablet  Commonly known as:  XANAX  Take 1 tablet (0.25 mg total) by mouth 2 (two) times daily as needed for anxiety.     ANTI MONKEY BUTT Powd  Apply 1 application topically as needed (burning itching drying up). Apply to legs and rub in     aspirin EC 81 MG tablet  Take 81 mg by mouth daily.     clopidogrel 75 MG tablet  Commonly known as:  PLAVIX  TAKE ONE TABLET BY MOUTH EVERY MORNING WITH BREAKFAST     colchicine 0.6 MG tablet  Take 1 tablet (0.6 mg total) by mouth daily as needed (for gout).     doxycycline 100 MG tablet  Commonly known as:  VIBRA-TABS  Take 1 tablet (100 mg total) by mouth 2 (two) times daily.     ezetimibe 10 MG tablet  Commonly known as:  ZETIA  TAKE ONE TABLET BY MOUTH EACH EVENING     fish oil-omega-3 fatty acids 1000 MG capsule  Take 1 g by mouth 2 (two) times daily.     furosemide 80 MG tablet  Commonly known as:  LASIX  TAKE 2 TABLETS BY MOUTH IN THE MORNING AND 1 TABLET IN THE EVENING  isosorbide mononitrate 30 MG 24 hr tablet  Commonly known as:  IMDUR  Take 0.5 tablets (15 mg total) by mouth daily.     JANUVIA 100 MG tablet  Generic drug:  sitaGLIPtin  Take 100 mg by mouth at bedtime.     methocarbamol 500 MG tablet  Commonly known as:  ROBAXIN  Take 1 tablet (500 mg total) by mouth daily as needed (leg cramps).     metolazone 2.5 MG tablet  Commonly known as:  ZAROXOLYN  Take 1 tablet (2.5 mg total) by mouth every other day.     NITROSTAT 0.4 MG SL tablet  Generic drug:  nitroGLYCERIN  DISSOLVE 1 TABLET UNDER THE TONGUE FOR CHEST PAIN. MAY REPEAT EVERY 5MINUTES UP TO 3 DOSES. IF NO RELIEF, CALL 911**     NOVOLOG 100 UNIT/ML injection  Generic drug:  insulin aspart  Inject 10 Units into the skin 3 (three) times daily with meals. 10 units + meal carb count     pantoprazole 40 MG tablet  Commonly known as:   PROTONIX  Take 1 tablet (40 mg total) by mouth every morning.     polyethylene glycol powder powder  Commonly known as:  GLYCOLAX/MIRALAX  Take 17 g by mouth daily as needed (for constipation).     potassium chloride SA 20 MEQ tablet  Commonly known as:  K-DUR,KLOR-CON  Take 2 tablets (40 mEq total) by mouth 3 (three) times daily.     ranitidine 150 MG tablet  Commonly known as:  ZANTAC  Take 150 mg by mouth 2 (two) times daily.     repaglinide 2 MG tablet  Commonly known as:  PRANDIN  Take 2 mg by mouth 3 (three) times daily before meals.     rosuvastatin 20 MG tablet  Commonly known as:  CRESTOR  Take 1 tablet (20 mg total) by mouth at bedtime.     sertraline 100 MG tablet  Commonly known as:  ZOLOFT  Take 1 tablet (100 mg total) by mouth daily.     TOUJEO SOLOSTAR 300 UNIT/ML Sopn  Generic drug:  Insulin Glargine  Inject 60-70 Units into the skin 3 (three) times daily with meals.     traMADol 50 MG tablet  Commonly known as:  ULTRAM  TAKE 1-2 TABLETS BY MOUTH EVERY 12 HOURS AS NEEDED FOR SEVERE PAIN (MAX 4 PILLS PER DAY)     Vitamin D (Ergocalciferol) 50000 UNITS Caps capsule  Commonly known as:  DRISDOL  Take 50,000 Units by mouth every 7 (seven) days. Take every week on saturday       Follow-up Information    Follow up with Lyda Jester, PA-C On 02/25/2015.   Specialties:  Cardiology, Radiology   Why:  10:30 AM   Contact information:   [address] [place] [address] [phone]      Greater than 30 minutes was spent completing the patient's discharge.    SignedTarri Fuller, [place] 02/11/2015, 3:06 PM

## 2015-02-11 NOTE — Progress Notes (Signed)
  Echocardiogram 2D Echocardiogram with Definity  has been performed.  Darlina Sicilian M 02/11/2015, 10:34 AM

## 2015-02-11 NOTE — Progress Notes (Signed)
CRITICAL VALUE ALERT  Critical value received:  POTASSIUM 2.7  Date of notification:  02/11/15 Time of notification:  [id number]  Critical value read back: YES Nurse who received alert:  Shellia Cleverly  MD notified (1st page):  J.KELLY TEXT PAGE  Time of first page:  0548 MD notified (2nd page):  Time of second page:  Responding MD:    Time MD responded:

## 2015-02-11 NOTE — Progress Notes (Signed)
Initial Nutrition Assessment  DOCUMENTATION CODES:   Obesity unspecified  INTERVENTION:    Continue heart healthy diet. If PO intake declines, recommend Glucerna Shake PO BID, each supplement provides 220 kcal and 10 grams of protein  NUTRITION DIAGNOSIS:   No nutrition diagnosis at this time.  GOAL:   Patient will meet greater than or equal to 90% of their needs  Met  MONITOR:   PO intake, Weight trends, Labs  REASON FOR ASSESSMENT:   Malnutrition Screening Tool    ASSESSMENT:   74 y.o. male w/ PMHx significant for advanced coronary and valvular heart disease who presented to [facility] on 02/10/2015 with complaints of chest pain.  Patient and wife report that patient has been eating well, but eating less because it helps with his reflux. Patient with a significant 5% weight loss within the last month, suspect related to eating less. Patient with a good appetite now, consuming 100% of meals. Nutrition focused physical exam completed.  No muscle or subcutaneous fat depletion noticed. Patient declines need for PO supplements at this time.  Diet Order:  Diet Heart Room service appropriate?: Yes; Fluid consistency:: Thin  Skin:  Reviewed, no issues  Last BM:  12/7  Height:   Ht Readings from Last 1 Encounters:  02/10/15 5' 9" (1.753 m)    Weight:   Wt Readings from Last 1 Encounters:  02/11/15 209 lb 8 oz (95.029 kg)    Ideal Body Weight:  72.7 kg  BMI:  Body mass index is 30.92 kg/(m^2).  Estimated Nutritional Needs:   Kcal:  1800-2000  Protein:  90-100 gm  Fluid:  1.5 L  EDUCATION NEEDS:   Education needs addressed  Kimberly Harris, RD, LDN, CNSC Pager [phone] After Hours Pager [phone]  

## 2015-02-11 NOTE — Progress Notes (Signed)
Subjective: Feels better.  He had some burning in his chest this morning.  Relieved with protonix.   Objective: Vital signs in last 24 hours: Temp:  [97.3 F (36.3 C)-97.8 F (36.6 C)] 97.8 F (36.6 C) (12/09 0506) Pulse Rate:  [55-77] 62 (12/09 0506) Resp:  [14-23] 18 (12/09 0506) BP: (109-132)/(53-64) 126/57 mmHg (12/09 0506) SpO2:  [84 %-100 %] 98 % (12/09 0506) Weight:  [209 lb 3.2 oz (94.892 kg)-209 lb 8 oz (95.029 kg)] 209 lb 8 oz (95.029 kg) (12/09 0506) Last BM Date: 02/09/15  Intake/Output from previous day: 12/08 0701 - 12/09 0700 In: 980 [P.O.:980] Out: 2050 [Urine:2050] Intake/Output this shift: Total I/O In: 120 [P.O.:120] Out: 450 [Urine:450]  Medications Scheduled Meds: . aspirin  324 mg Oral Once  . aspirin EC  81 mg Oral Daily  . clopidogrel  75 mg Oral Daily  . doxycycline  100 mg Oral BID  . enoxaparin (LOVENOX) injection  40 mg Subcutaneous Q24H  . ezetimibe  10 mg Oral Daily  . famotidine  20 mg Oral BID  . feeding supplement (ENSURE ENLIVE)  237 mL Oral BID BM  . furosemide  160 mg Oral BID  . insulin aspart  0-15 Units Subcutaneous TID WC  . insulin aspart  0-5 Units Subcutaneous QHS  . insulin aspart  30 Units Subcutaneous TID WC  . linagliptin  5 mg Oral Daily  . [START ON 02/12/2015] metolazone  2.5 mg Oral QODAY  . omega-3 acid ethyl esters  1 g Oral BID  . pantoprazole  40 mg Oral BID AC  . potassium chloride SA  40 mEq Oral TID PC & HS  . repaglinide  2 mg Oral TID AC  . rosuvastatin  20 mg Oral QHS  . sertraline  100 mg Oral Daily   Continuous Infusions:  PRN Meds:.acetaminophen, ALPRAZolam, colchicine, gi cocktail, methocarbamol, ondansetron (ZOFRAN) IV, polyethylene glycol, traMADol, zolpidem  PE: General appearance: alert, cooperative and no distress Neck: no JVD Lungs: clear to auscultation bilaterally Heart: regular rate and rhythm, S1, S2 normal, no murmur, click, rub or gallop Extremities: No LEE Pulses: 2+ right rad  1+ left rad Skin: warm and dry Neurologic: Grossly normal  Lab Results:   Recent Labs  02/10/15 1250  WBC 8.9  HGB 11.7*  HCT 37.8*  PLT 210   BMET  Recent Labs  02/10/15 1250 02/11/15 0451  NA 136 135  K 3.4* 2.7*  CL 92* 94*  CO2 32 30  GLUCOSE 179* 199*  BUN 44* 43*  CREATININE 1.84* 1.79*  CALCIUM 10.1 9.4   Cardiac Panel (last 3 results)  Recent Labs  02/10/15 1712 02/10/15 2307 02/11/15 0451  TROPONINI 0.17* 0.20* 0.17*      Assessment/Plan   74 y.o. male w/ PMHx significant for advanced coronary and valvular heart disease who presented to [facility] on 02/10/2015 with complaints of chest pain. He has been followed for coronary disease s/p CABG and PCI, rheumatic heart disease, and CHF.   He underwent 6 vessel CABG in 1998. He's undergone multiple PCI procedures, most recently with rotational atherectomy and bare-metal stenting of the native right coronary artery in 2014. 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 He had an uncomplicated postoperative course.  Other comorbid medical conditions include longstanding Type 2 diabetes, stage 3 CKD, and chronic diastolic heart failure.   Active Problems:  Acute on chronic diastolic congestive heart failure, NYHA class 4 (HCC)   Net fluids: -1.1L.  No JVD but does have hepatojug reflux.  SP 1 dose IV lasix.  Back on PO 160 BID and 2.5 metolazone every other day.       Unstable angina (HCC) Troponin trend flat>  0.17>>0.20>>0.17.  Likely CHF related demand.  Pain sounds like indigestion "burning"  Improved after protonix.   Hypokalemia  Replaced this AM.  Rechecking K at 1200hrs.   Rheumatic heart disease;  SP TAVR. He has severe mitral stenosis with advanced rheumatic changes and calcification. He is not a candidate for further invasive therapies related to his  valvular disease, nor does he wish to pursue these.    CAD  Imdur added yesterday.  Monitoring for hypotension.  ASA, plavix CKD stage III  Stable DM II  Stable.   DC today or tomorrow.  Need to see K first.   HAGER, BRYAN PA-C 02/11/2015 8:07 AM  Patient seen, examined. Available data reviewed. Agree with findings, assessment, and plan as outlined by Tarri Fuller, PA-C. Echo as outlined below:  Study Conclusions  - Left ventricle: Apical septal, apical inferior and apex are akinetic. No thombus is seen. The cavity size was normal. There was severe concentric hypertrophy. Systolic function was normal. The estimated ejection fraction was in the range of 55% to 60%. Wall motion was normal; there were no regional wall motion abnormalities. Features are consistent with a pseudonormal left ventricular filling pattern, with concomitant abnormal relaxation and increased filling pressure (grade 2 diastolic dysfunction). Doppler parameters are consistent with elevated ventricular end-diastolic filling pressure. - Ventricular septum: The contour showed diastolic flattening and systolic flattening. - Aortic valve: A bioprosthetic valve sits well in the aortic position. Transortic gradients are normal and unchanged from prior. There is trivila central aortic regurgitation and no paravalvular leak. There was trivial regurgitation. Mean gradient (S): 9 mm Hg. Peak gradient (S): 18 mm Hg. Valve area (VTI): 2.32 cm^2. Valve area (Vmax): 1.97 cm^2. Valve area (Vmean): 2.11 cm^2. - Aortic root: The aortic root was normal in size. - Ascending aorta: The ascending aorta was normal in size. - Mitral valve: Calcified annulus. Severely thickened, severely calcified leaflets . The findings are consistent with severe stenosis. There was mild regurgitation. Mean gradient (D): 20 mm Hg. Peak gradient 31 mmHg. Valve area by pressure half-time: 1.58 cm^2. Valve  area by continuity equation (using LVOT flow): 0.74 cm^2. - Left atrium: The atrium was severely dilated. - Right ventricle: The cavity size was moderately dilated. Wall thickness was normal. Systolic function was moderately reduced.  Impressions:  - There is no significant difference when compared to the prior study from 06/2014, regional wall motion in the apex were present on the prior study as well. Overall LVEF preserved at 55-60%. RV remains moderately dilated with moderately reduced systolic function.  The patient is feeling better. He has been up and denied any shortness of breath or lightheadedness when moving around in the room. He was noted to be hypokalemic this morning and potassium has been repleted. His repeat metabolic panel shows improved potassium, still in the low range. His exam is unchanged with decreased air movement but clear lung fields without rales. Heart is regular rate and rhythm without murmur. Extremities with 1+ edema unchanged. All laboratory data is reviewed. The patient has tolerated his first doses of isosorbide. I would recommend discharging him on his home diuretic regimen. The only new medication will be isosorbide 15  mg daily. Recommend follow-up in the next 1-2 weeks with a repeat metabolic panel. His outpatient echocardiogram should be canceled as this was done in the hospital.  [name], M.D. 02/11/2015 1:35 PM

## 2015-02-14 ENCOUNTER — Other Ambulatory Visit: Payer: Self-pay | Admitting: Cardiovascular Disease

## 2015-02-15 ENCOUNTER — Telehealth: Payer: Self-pay | Admitting: Cardiovascular Disease

## 2015-02-15 MED ORDER — PANTOPRAZOLE SODIUM 40 MG PO TBEC: 40.0000 mg | DELAYED_RELEASE_TABLET | Freq: Every morning | ORAL | Status: AC

## 2015-02-15 NOTE — Telephone Encounter (Signed)
°  New Message   Pt states that they dont have a refill  but they dont know the name   Pt wants rn to call him and wife they state rn is aware and knows the name of the drug

## 2015-02-15 NOTE — Telephone Encounter (Signed)
I spoke with the pt's wife and she said the pt needs a refill on Protonix.  The pt was hospitalized last week due to indigestion and he has been unable to get this medication. I have authorized an electronic Rx to the pharmacy.

## 2015-02-17 DIAGNOSIS — H4720 Unspecified optic atrophy: Secondary | ICD-10-CM | POA: Insufficient documentation

## 2015-02-25 ENCOUNTER — Ambulatory Visit (INDEPENDENT_AMBULATORY_CARE_PROVIDER_SITE_OTHER): Payer: Medicare Other | Admitting: Cardiology

## 2015-02-25 ENCOUNTER — Encounter: Payer: Self-pay | Admitting: Cardiology

## 2015-02-25 VITALS — BP 110/56 | HR 88 | Ht 69.0 in | Wt 218.0 lb

## 2015-02-25 DIAGNOSIS — I200 Unstable angina: Secondary | ICD-10-CM | POA: Diagnosis not present

## 2015-02-25 DIAGNOSIS — Z5181 Encounter for therapeutic drug level monitoring: Secondary | ICD-10-CM

## 2015-02-25 LAB — BASIC METABOLIC PANEL
BUN: 49 mg/dL — ABNORMAL HIGH (ref 7–25)
CHLORIDE: 90 mmol/L — AB (ref 98–110)
CO2: 29 mmol/L (ref 20–31)
Calcium: 9.8 mg/dL (ref 8.6–10.3)
Creat: 2.02 mg/dL — ABNORMAL HIGH (ref 0.70–1.18)
GLUCOSE: 335 mg/dL — AB (ref 65–99)
POTASSIUM: 3.1 mmol/L — AB (ref 3.5–5.3)
SODIUM: 134 mmol/L — AB (ref 135–146)

## 2015-02-25 NOTE — Patient Instructions (Addendum)
Medication Instructions:   Your physician recommends that you continue on your current medications as directed. Please refer to the Current Medication list given to you today.'    If you need a refill on your cardiac medications before your next appointment, please call your pharmacy.  Labwork:  BMET TODAY    Testing/Procedures:  NONE ORDER TODAY    Follow-Up:  AS SCHEDULED   Any Other Special Instructions Will Be Listed Below (If Applicable).

## 2015-02-25 NOTE — Progress Notes (Signed)
02/25/2015 [name] [name]   [birth year]  [id number]  Primary Physician [name] Stain, MD Primary Cardiologist: Dr. Burt [name]   Reason for Visit/CC: post hospital f/u for acute on chronic CHF  HPI:  74 y.o. male w/ PMHx significant for advanced coronary and valvular heart disease. He has been followed for coronary disease s/p CABG and PCI, rheumatic heart disease, and CHF.   He underwent 6 vessel CABG in 1998. He's undergone multiple PCI procedures, most recently with rotational atherectomy and bare-metal stenting of the native right coronary artery in 2014. 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 He had an uncomplicated postoperative course.Other comorbid medical conditions include longstanding Type 2 diabetes, stage 3 CKD, and chronic diastolic heart failure.  He presents to clinic for post hospital f/u. He was recently admitted to [facility] for acute on chronic diastolic CHF, in the setting of several missed doses of his metolazone. He was treated with IV lasix and symptoms improved. 2 echo showed EF of 55-60% and G2DD.  Mitral valve was severely thickened with severely calcified leaflets. There was severe stenosis. Mild regurgitation. Mean gradient 20 mmHg the peak 31 mmHg. Was no significant change compared to the prior study from April 2016. He was started on 15 mg of isosorbide. The patient was seen by Dr. Burt [name] who felt he was stable for DC home. He was instructed to continue lasix BID along with metolazone every other day with supplemental K.   Today in follow-up he reports that he has done well. He denies dyspnea. No weight gain or LEE. He also denies angina. He has been fully compliant with his medications.    Current Outpatient Prescriptions  Medication Sig Dispense Refill  . ALPRAZolam (XANAX) 0.25 MG tablet Take 1 tablet (0.25 mg  total) by mouth 2 (two) times daily as needed for anxiety. (Patient not taking: Reported on 02/10/2015) 10 tablet 0  . aspirin EC 81 MG tablet Take 81 mg by mouth daily.    . clopidogrel (PLAVIX) 75 MG tablet TAKE ONE TABLET BY MOUTH EVERY MORNING WITH BREAKFAST (Patient taking differently: TAKE 75 MG BY MOUTH EVERY MORNING WITH BREAKFAST) 90 tablet 2  . colchicine 0.6 MG tablet Take 1 tablet (0.6 mg total) by mouth daily as needed (for gout). 20 tablet 0  . doxycycline (VIBRA-TABS) 100 MG tablet Take 1 tablet (100 mg total) by mouth 2 (two) times daily. (Patient not taking: Reported on 02/10/2015) 20 tablet 0  . ezetimibe (ZETIA) 10 MG tablet TAKE ONE TABLET BY MOUTH EACH EVENING (Patient taking differently: Take 10 mg by mouth at bedtime. ) 90 tablet 0  . fish oil-omega-3 fatty acids 1000 MG capsule Take 1 g by mouth 2 (two) times daily.     . furosemide (LASIX) 80 MG tablet TAKE 2 TABLETS BY MOUTH IN THE MORNING AND 1 TABLET IN THE EVENING 90 tablet 3  . isosorbide mononitrate (IMDUR) 30 MG 24 hr tablet Take 0.5 tablets (15 mg total) by mouth daily. 30 tablet 5  . JANUVIA 100 MG tablet Take 100 mg by mouth at bedtime.     . methocarbamol (ROBAXIN) 500 MG tablet Take 1 tablet (500 mg total) by mouth daily as needed (leg cramps). 30 tablet 1  . metolazone (ZAROXOLYN) 2.5 MG tablet Take 1 tablet (2.5 mg total) by mouth every other day. 30 tablet 4  . NITROSTAT 0.4 MG SL  tablet DISSOLVE 1 TABLET UNDER THE TONGUE FOR CHEST PAIN. MAY REPEAT EVERY 5MINUTES UP TO 3 DOSES. IF NO RELIEF, CALL 911** 25 tablet 1  . NOVOLOG 100 UNIT/ML injection Inject 10 Units into the skin 3 (three) times daily with meals. 10 units + meal carb count    . pantoprazole (PROTONIX) 40 MG tablet Take 1 tablet (40 mg total) by mouth every morning. 90 tablet 3  . polyethylene glycol powder (GLYCOLAX/MIRALAX) powder Take 17 g by mouth daily as needed (for constipation). 850 g 1  . potassium chloride SA (K-DUR,KLOR-CON) 20 MEQ tablet  Take 2 tablets (40 mEq total) by mouth 3 (three) times daily. 180 tablet 3  . Powders (ANTI MONKEY BUTT) POWD Apply 1 application topically as needed (burning itching drying up). Apply to legs and rub in    . ranitidine (ZANTAC) 150 MG tablet Take 150 mg by mouth 2 (two) times daily.     . repaglinide (PRANDIN) 2 MG tablet Take 2 mg by mouth 3 (three) times daily before meals.     . rosuvastatin (CRESTOR) 20 MG tablet Take 1 tablet (20 mg total) by mouth at bedtime. 30 tablet 6  . sertraline (ZOLOFT) 100 MG tablet Take 1 tablet (100 mg total) by mouth daily. 90 tablet 1  . TOUJEO SOLOSTAR 300 UNIT/ML SOPN Inject 60-70 Units into the skin 3 (three) times daily with meals.    . traMADol (ULTRAM) 50 MG tablet TAKE 1-2 TABLETS BY MOUTH EVERY 12 HOURS AS NEEDED FOR SEVERE PAIN (MAX 4 PILLS PER DAY) (Patient taking differently: Take 50-100 mg by mouth every 12 (twelve) hours as needed for moderate pain. ) 60 tablet 1  . Vitamin D, Ergocalciferol, (DRISDOL) 50000 UNITS CAPS Take 50,000 Units by mouth every 7 (seven) days. Take every week on saturday    . [DISCONTINUED] promethazine (PHENERGAN) 25 MG tablet Take 1/2 to 1 tablet up to every 6 hours as needed for nausea.      No current facility-administered medications for this visit.    Allergies  Allergen Reactions  . Adhesive [Tape] Rash    blistered  . Celecoxib Rash    Social History   Social History  . Marital Status: Married    Spouse Name: N/A  . Number of Children: N/A  . Years of Education: N/A   Occupational History  . Not on file.   Social History Main Topics  . Smoking status: Never Smoker   . Smokeless tobacco: Never Used  . Alcohol Use: No  . Drug Use: No  . Sexual Activity: Not on file   Other Topics Concern  . Not on file   Social History Narrative     Review of Systems: General: negative for chills, fever, night sweats or weight changes.  Cardiovascular: negative for chest pain, dyspnea on exertion, edema,  orthopnea, palpitations, paroxysmal nocturnal dyspnea or shortness of breath Dermatological: negative for rash Respiratory: negative for cough or wheezing Urologic: negative for hematuria Abdominal: negative for nausea, vomiting, diarrhea, bright red blood per rectum, melena, or hematemesis Neurologic: negative for visual changes, syncope, or dizziness All other systems reviewed and are otherwise negative except as noted above.    Blood pressure 110/56, pulse 88, height 5\' 9"  (1.753 m), weight 218 lb (98.884 kg).  General appearance: alert, cooperative and no distress Neck: no carotid bruit and no JVD Lungs: clear to auscultation bilaterally Heart: regular rate and rhythm, S1, S2 normal, no murmur, click, rub or gallop Extremities: no LE, +  bilateral LE stasis dermatitis Pulses: 2+ and symmetric Skin: warm and dry Neurologic: Grossly normal  EKG not performed  ASSESSMENT AND PLAN:   1. Chronic Diastolic CHF: euvolemic on physical exam. No dyspnea. Continue daily lasix + metolazone every other day and supplemental K. BP is well controlled. We will recheck f/u BMP today to reassess renal function and K. Low sodium diet and daily weights advised.   2. CAD: stable w/o angina.   3. H/o AS: s/p TAVR 4/15. Followed by Dr. Burt [name].   4. HTN: BP is well controlled.   PLAN  Stable post hospital. F/u with Dr. Burt [name] in 3 months or sooner if needed.   Micahel Omlor PA-C 02/25/2015 11:02 AM

## 2015-03-03 ENCOUNTER — Telehealth: Payer: Self-pay | Admitting: Cardiovascular Disease

## 2015-03-03 DIAGNOSIS — Z79899 Other long term (current) drug therapy: Secondary | ICD-10-CM

## 2015-03-03 DIAGNOSIS — R7989 Other specified abnormal findings of blood chemistry: Secondary | ICD-10-CM

## 2015-03-03 NOTE — Telephone Encounter (Signed)
Called patient back. Patient gave verbal permission to take with his wife. Per Lyda Jester PA, His Scr is up since discharge, now at 2.02 and K is low at 3.1 Make sure patient is only taking metolazone every other day and not daily. He needs to take 30 min prior to lasix dose. Also make sure patient is taking his potassium TID. Will need to recheck BMP in 10 days to reassess renal function. Patient is taking all his medications as directed. Patient will come in on 03/14/15 for repeat BMET.

## 2015-03-03 NOTE — Telephone Encounter (Signed)
New message    Patient wife returning call back to nurse from yesterday.

## 2015-03-04 ENCOUNTER — Other Ambulatory Visit: Payer: Medicare Other

## 2015-03-14 ENCOUNTER — Other Ambulatory Visit: Payer: Medicare Other

## 2015-03-15 ENCOUNTER — Other Ambulatory Visit (INDEPENDENT_AMBULATORY_CARE_PROVIDER_SITE_OTHER): Payer: Medicare Other | Admitting: *Deleted

## 2015-03-15 DIAGNOSIS — I5032 Chronic diastolic (congestive) heart failure: Secondary | ICD-10-CM

## 2015-03-15 DIAGNOSIS — Z79899 Other long term (current) drug therapy: Secondary | ICD-10-CM

## 2015-03-15 DIAGNOSIS — R0600 Dyspnea, unspecified: Secondary | ICD-10-CM

## 2015-03-15 DIAGNOSIS — R748 Abnormal levels of other serum enzymes: Secondary | ICD-10-CM | POA: Diagnosis not present

## 2015-03-15 DIAGNOSIS — I350 Nonrheumatic aortic (valve) stenosis: Secondary | ICD-10-CM

## 2015-03-15 DIAGNOSIS — R7989 Other specified abnormal findings of blood chemistry: Secondary | ICD-10-CM

## 2015-03-15 LAB — BASIC METABOLIC PANEL
BUN: 45 mg/dL — ABNORMAL HIGH (ref 7–25)
CALCIUM: 9.6 mg/dL (ref 8.6–10.3)
CO2: 28 mmol/L (ref 20–31)
Chloride: 94 mmol/L — ABNORMAL LOW (ref 98–110)
Creat: 1.89 mg/dL — ABNORMAL HIGH (ref 0.70–1.18)
Glucose, Bld: 213 mg/dL — ABNORMAL HIGH (ref 65–99)
POTASSIUM: 3.4 mmol/L — AB (ref 3.5–5.3)
SODIUM: 137 mmol/L (ref 135–146)

## 2015-03-15 NOTE — Addendum Note (Signed)
Addended by: Eulis Foster on: 03/15/2015 12:23 PM   Modules accepted: Orders

## 2015-03-17 ENCOUNTER — Other Ambulatory Visit: Payer: Self-pay | Admitting: *Deleted

## 2015-03-17 DIAGNOSIS — I10 Essential (primary) hypertension: Secondary | ICD-10-CM

## 2015-03-17 DIAGNOSIS — E876 Hypokalemia: Secondary | ICD-10-CM

## 2015-03-17 MED ORDER — POTASSIUM CHLORIDE CRYS ER 20 MEQ PO TBCR: EXTENDED_RELEASE_TABLET | ORAL | Status: DC

## 2015-03-28 ENCOUNTER — Other Ambulatory Visit: Payer: Self-pay | Admitting: Cardiovascular Disease

## 2015-04-01 ENCOUNTER — Other Ambulatory Visit: Payer: Self-pay | Admitting: *Deleted

## 2015-04-01 DIAGNOSIS — R252 Cramp and spasm: Secondary | ICD-10-CM

## 2015-04-01 NOTE — Telephone Encounter (Signed)
Faxed refill request. Last Filled:    30 tablet 1 07/07/2014  Please advise.

## 2015-04-03 MED ORDER — METHOCARBAMOL 500 MG PO TABS: 500.0000 mg | ORAL_TABLET | Freq: Every day | ORAL | Status: DC | PRN

## 2015-04-03 NOTE — Telephone Encounter (Signed)
Sent. Thanks.   

## 2015-04-13 ENCOUNTER — Ambulatory Visit: Payer: Medicare Other | Admitting: Internal Medicine

## 2015-04-13 ENCOUNTER — Telehealth: Payer: Self-pay | Admitting: Family Medicine

## 2015-04-13 NOTE — Telephone Encounter (Signed)
[facility]  Patient Name: [name] [name]  DOB: 05-18-1940    Initial Comment * NEEDS 2nd ATTEMPT* Caller states husband is dizzy- he is on Oxygen-    Nurse Assessment      Guidelines    Guideline Title Affirmed Question Affirmed Notes       Final Disposition User   FINAL ATTEMPT MADE - message left [place], Therapist, sports, Tillie Rung

## 2015-04-13 NOTE — Telephone Encounter (Addendum)
Mrs Linkenhoker said pt is dizzy on and off for 1 week.pt had appt 04/13/15 at 2:30 because he was dizzy and had stomach pain (pain level 4-5) pt not eating a lot and pt drinks a lot but also takes a lot of diuretics.pt was so dizzy he could not come to appt. Pt is asleep right now. Mrs Mooradian said pt refuses to go to ED for eval. Pt is on 2L of continuous of oxygen. Pt has SOB on and off. Mrs Vankampen said cannot schedule appt because the symptoms come and go and if dizzy she cannot bring him to office. Dr Damita Dunnings said if pt resting now, schedule appt for 04/14/15 and if condition changes or worsens prior to appt pt to go to ED by ambulance. Mrs Betsinger voiced understanding.pt has 30 min appt with Allie Bossier NP on 04/14/15 at 10:15.

## 2015-04-14 ENCOUNTER — Encounter: Payer: Self-pay | Admitting: Primary Care

## 2015-04-14 ENCOUNTER — Ambulatory Visit (INDEPENDENT_AMBULATORY_CARE_PROVIDER_SITE_OTHER): Payer: Medicare Other | Admitting: Primary Care

## 2015-04-14 VITALS — BP 118/62 | HR 115 | Temp 97.6°F | Ht 69.0 in | Wt 220.1 lb

## 2015-04-14 DIAGNOSIS — R42 Dizziness and giddiness: Secondary | ICD-10-CM | POA: Diagnosis not present

## 2015-04-14 DIAGNOSIS — E119 Type 2 diabetes mellitus without complications: Secondary | ICD-10-CM

## 2015-04-14 LAB — CBC WITH DIFFERENTIAL/PLATELET
BASOS PCT: 0.4 % (ref 0.0–3.0)
Basophils Absolute: 0 10*3/uL (ref 0.0–0.1)
EOS PCT: 0 % (ref 0.0–5.0)
Eosinophils Absolute: 0 10*3/uL (ref 0.0–0.7)
HEMATOCRIT: 33.3 % — AB (ref 39.0–52.0)
Hemoglobin: 10.1 g/dL — ABNORMAL LOW (ref 13.0–17.0)
LYMPHS ABS: 0.8 10*3/uL (ref 0.7–4.0)
Lymphocytes Relative: 7.8 % — ABNORMAL LOW (ref 12.0–46.0)
MCHC: 30.4 g/dL (ref 30.0–36.0)
MCV: 67.6 fl — AB (ref 78.0–100.0)
MONOS PCT: 8.9 % (ref 3.0–12.0)
Monocytes Absolute: 0.9 10*3/uL (ref 0.1–1.0)
NEUTROS ABS: 8.5 10*3/uL — AB (ref 1.4–7.7)
NEUTROS PCT: 82.9 % — AB (ref 43.0–77.0)
PLATELETS: 261 10*3/uL (ref 150.0–400.0)
RBC: 4.92 Mil/uL (ref 4.22–5.81)
RDW: 22.5 % — AB (ref 11.5–15.5)
WBC: 10.2 10*3/uL (ref 4.0–10.5)

## 2015-04-14 LAB — COMPREHENSIVE METABOLIC PANEL
ALK PHOS: 61 U/L (ref 39–117)
ALT: 9 U/L (ref 0–53)
AST: 18 U/L (ref 0–37)
Albumin: 3.9 g/dL (ref 3.5–5.2)
BILIRUBIN TOTAL: 0.7 mg/dL (ref 0.2–1.2)
BUN: 49 mg/dL — ABNORMAL HIGH (ref 6–23)
CALCIUM: 9.5 mg/dL (ref 8.4–10.5)
CO2: 33 meq/L — AB (ref 19–32)
CREATININE: 1.88 mg/dL — AB (ref 0.40–1.50)
Chloride: 93 mEq/L — ABNORMAL LOW (ref 96–112)
GFR: 37.62 mL/min — AB (ref >60.00)
GLUCOSE: 84 mg/dL (ref 70–99)
POTASSIUM: 3.5 meq/L (ref 3.5–5.1)
Sodium: 135 mEq/L (ref 135–145)
Total Protein: 7.6 g/dL (ref 6.0–8.3)

## 2015-04-14 LAB — POC URINALSYSI DIPSTICK (AUTOMATED)
BILIRUBIN UA: NEGATIVE
Glucose, UA: NEGATIVE
KETONES UA: NEGATIVE
LEUKOCYTES UA: NEGATIVE
Nitrite, UA: NEGATIVE
PH UA: 7.5
PROTEIN UA: NEGATIVE
RBC UA: NEGATIVE
SPEC GRAV UA: 1.02
Urobilinogen, UA: NEGATIVE

## 2015-04-14 LAB — HEMOGLOBIN A1C: HEMOGLOBIN A1C: 8.4 % — AB (ref 4.6–6.5)

## 2015-04-14 NOTE — Progress Notes (Signed)
Subjective:    Patient ID: [name] Kinds., male    DOB: 1940-11-05, 75 y.o.   MRN: [id number]  HPI  [name]. Is a 75 year old male who presents today with a chief complaint of dizziness. He also reports abdominal discomfort and intermittent shortness of breath. His dizziness is present more so at lunch time and has been present for past 4 weeks. He is currently managed on oxygen at 2 liters continuously and endorses wearing his oxygen as prescribed. He has a history of diabetes and is managed by endocrinology. His blood sugars are running between 120-300's. He was recently placed on back on a daily Nitroglycerin shortly before Christmas. His BP last night was 128/68. Denies changes in urinary frequency, hematuria, penile discharge, diarrhea, nausea.  2) Abdominal Discomfort: Pain located to bilateral lower quadrant/pelvis that has been present for the past 2-3 days. He describes his discomfort as tight. He had a bowel movement today and felt improved. He had not had a bowel movement for 2 days prior to today. He is managed on Tramadol daily for chronic osteoarthritis.   Review of Systems  Constitutional: Negative for fever and chills.  Respiratory: Negative for cough.   Cardiovascular: Negative for chest pain.  Gastrointestinal: Positive for constipation. Negative for nausea and diarrhea.  Genitourinary: Negative for frequency, hematuria and discharge.  Neurological: Positive for dizziness. Negative for weakness.       Past Medical History  Diagnosis Date  . GERD (gastroesophageal reflux disease)   . Hyperlipidemia   . Hypertension   . Diabetes mellitus   . Carotid artery disease ([place])   . Cellulitis and abscess of leg, except foot   . Leg edema   . Right knee sprain   . Cramps, muscle, general   . Low back pain   . Hiatal hernia   . Chills   . Hearing loss   . Nasal congestion   . Cough   . Wheezing   . Generalized headaches   . CAD (coronary artery disease) 11/05/1996     a. CABG x6 by Dr Redmond Pulling - LIMA to Diag+LAD, SVG to OM1+OM2, SVG to PDA+RPL, b. VG->RCA known to be occluded;  c. 05/2012 PCI/Rota/BMS to m/dRCA w/ 3.5x15 Vision BMS x 2 post-dil to 3.75.  Marland Kitchen Mitral stenosis     a. moderate by echo 04/2012.  Marland Kitchen Chronic diastolic congestive heart failure ([place])     a. 04/2012 Echo: EF 55-60-%  . Aortic stenosis     a. 04/2012 s/p baloon valvuloplasty w/ reduction in gradient from 47 to 27.  . Pulmonary hypertension ([place])     a. 05/2012 RH cath: PA 97/32(54)  . Venous insufficiency (chronic) (peripheral)   . Sleep apnea     no cpap  sleep study >5 yrs  . Shortness of breath   . Anxiety   . Arthritis   . 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 Fracture of humerus, proximal, right, closed 2016  . On home oxygen therapy     "2L 4PM til 8:30AM qd" (02/10/2015)    Social History   Social History  . Marital Status: Married    Spouse Name: N/A  . Number of Children: N/A  . Years of Education: N/A   Occupational History  . Not on file.   Social History Main Topics  . Smoking status: Never Smoker   .  Smokeless tobacco: Never Used  . Alcohol Use: No  . Drug Use: No  . Sexual Activity: Not on file   Other Topics Concern  . Not on file   Social History Narrative    Past Surgical History  Procedure Laterality Date  . Ett cardiolite  12/31/2006    Mild-Mod distal inf/apical ischemia  . Open heart surgery    . Acute or chronic diastolic hf  [id number] - [id number]    RLE cellulitis - HOSP  . Doppler echocardiography  10/11/2008    Mild AS, Mild-Mod MS, Severe LVH  . Le arterial and venous US  10/11/2008    Art clear.  Venous Neg DVT  . Appendectomy  12/25/2010    Dr Redmond Pulling  . Umbilical hernia repair  12/25/2010    Dr Redmond Pulling  . Hernia repair  [id number]    umbilical hernia repair   . Tee without cardioversion  03/20/2012    Procedure:  TRANSESOPHAGEAL ECHOCARDIOGRAM (TEE);  Surgeon: [name], MD;  Location: [place];  Service: Cardiovascular;  Laterality: N/A;  . Cardiac catheterization    . Coronary artery bypass graft    . Transcatheter aortic valve replacement, transfemoral N/A 06/16/2013    Procedure: TRANSCATHETER AORTIC VALVE REPLACEMENT, TRANSFEMORAL;  Surgeon: [name], MD;  Location: [place];  Service: Open Heart Surgery;  Laterality: N/A;  . Intraoperative transesophageal echocardiogram N/A 06/16/2013    Procedure: INTRAOPERATIVE TRANSESOPHAGEAL ECHOCARDIOGRAM;  Surgeon: [name], MD;  Location: [facility] OR;  Service: Open Heart Surgery;  Laterality: N/A;  . Percutaneous coronary rotoblator intervention (pci-r) N/A 05/07/2012    Procedure: PERCUTANEOUS CORONARY ROTOBLATOR INTERVENTION (PCI-R);  Surgeon: [name], MD;  Location[facility] CATH LAB;  Service: Cardiovascular;  Laterality: N/A;  . Left and right heart catheterization with coronary angiogram N/A 03/20/2013    Procedure: LEFT AND RIGHT HEART CATHETERIZATION WITH CORONARY ANGIOGRAM;  Surgeon: Blane Ohara, MD;  Location: [facility] C[facility] LAB;  Service: Cardiovascular;  Laterality: N/A;    Family History  Problem Relation Age of Onset  . Diabetes Father   . Emphysema Mother   . Heart disease Father   . Diabetes Son   . Other Son     HEALTHY    Allergies  Allergen Reactions  . Adhesive [Tape] Rash    blistered  . Celecoxib Rash    Current Outpatient Prescriptions on File Prior to Visit  Medication Sig Dispense Refill  . aspirin EC 81 MG tablet Take 81 mg by mouth daily.    . clopidogrel (PLAVIX) 75 MG tablet TAKE ONE TABLET BY MOUTH EVERY MORNING WITH BREAKFAST (Patient taking differently: TAKE 75 MG BY MOUTH EVERY MORNING WITH BREAKFAST) 90 tablet 2  . colchicine 0.6 MG tablet Take 1 tablet (0.6 mg total) by mouth daily as needed (for gout). 20 tablet 0  . ezetimibe (ZETIA) 10 MG tablet TAKE ONE TABLET BY MOUTH EACH EVENING (Patient taking  differently: Take 10 mg by mouth at bedtime. ) 90 tablet 0  . fish oil-omega-3 fatty acids 1000 MG capsule Take 1 g by mouth 2 (two) times daily.     . furosemide (LASIX) 80 MG tablet TAKE 2 TABLETS BY MOUTH IN THE MORNING AND 1 TABLET IN THE EVENING 90 tablet 10  . isosorbide mononitrate (IMDUR) 30 MG 24 hr tablet Take 0.5 tablets (15 mg total) by mouth daily. 30 tablet 5  . JANUVIA 100 MG tablet Take 100 mg by mouth at bedtime.     . methocarbamol (ROBAXIN) 500  MG tablet Take 1 tablet (500 mg total) by mouth daily as needed (leg cramps). 30 tablet 1  . metolazone (ZAROXOLYN) 2.5 MG tablet Take 1 tablet (2.5 mg total) by mouth every other day. 30 tablet 4  . NITROSTAT 0.4 MG SL tablet DISSOLVE 1 TABLET UNDER THE TONGUE FOR CHEST PAIN. MAY REPEAT EVERY 5MINUTES UP TO 3 DOSES. IF NO RELIEF, CALL 911** 25 tablet 1  . NOVOLOG 100 UNIT/ML injection Inject 10 Units into the skin 3 (three) times daily with meals. 10 units + meal carb count    . pantoprazole (PROTONIX) 40 MG tablet Take 1 tablet (40 mg total) by mouth every morning. 90 tablet 3  . polyethylene glycol powder (GLYCOLAX/MIRALAX) powder Take 17 g by mouth daily as needed (for constipation). 850 g 1  . potassium chloride SA (K-DUR,KLOR-CON) 20 MEQ tablet Take 2 tablets at breakfast and dinner; take 3 tablets at bedtime for a total of 7 tablets daily. 180 tablet 3  . Powders (ANTI MONKEY BUTT) POWD Apply 1 application topically as needed (burning itching drying up). Apply to legs and rub in    . ranitidine (ZANTAC) 150 MG tablet Take 150 mg by mouth 2 (two) times daily.     . repaglinide (PRANDIN) 2 MG tablet Take 2 mg by mouth 3 (three) times daily before meals.     . sertraline (ZOLOFT) 100 MG tablet Take 1 tablet (100 mg total) by mouth daily. 90 tablet 1  . TOUJEO SOLOSTAR 300 UNIT/ML SOPN Inject 60-70 Units into the skin 3 (three) times daily with meals.    . traMADol (ULTRAM) 50 MG tablet TAKE 1-2 TABLETS BY MOUTH EVERY 12 HOURS AS NEEDED  FOR SEVERE PAIN (MAX 4 PILLS PER DAY) (Patient taking differently: Take 50-100 mg by mouth every 12 (twelve) hours as needed for moderate pain. ) 60 tablet 1  . Vitamin D, Ergocalciferol, (DRISDOL) 50000 UNITS CAPS Take 50,000 Units by mouth every 7 (seven) days. Take every week on saturday    . ALPRAZolam (XANAX) 0.25 MG tablet Take 1 tablet (0.25 mg total) by mouth 2 (two) times daily as needed for anxiety. (Patient not taking: Reported on 02/10/2015) 10 tablet 0  . doxycycline (VIBRA-TABS) 100 MG tablet Take 1 tablet (100 mg total) by mouth 2 (two) times daily. (Patient not taking: Reported on 04/14/2015) 20 tablet 0  . rosuvastatin (CRESTOR) 20 MG tablet Take 1 tablet (20 mg total) by mouth at bedtime. 30 tablet 6  . [DISCONTINUED] promethazine (PHENERGAN) 25 MG tablet Take 1/2 to 1 tablet up to every 6 hours as needed for nausea.      No current facility-administered medications on file prior to visit.    BP 118/62 mmHg  Pulse 115  Temp(Src) 97.6 F (36.4 C) (Oral)  Ht 5\' 9"  (1.753 m)  Wt 220 lb 1.9 oz (99.846 kg)  BMI 32.49 kg/m2  SpO2 96%    Objective:   Physical Exam  Constitutional: He appears well-nourished.  HENT:  Nose: Nose normal.  Mouth/Throat: Oropharynx is clear and moist.  Eyes: Conjunctivae are normal.  Neck: Neck supple.  Cardiovascular: Normal rate and regular rhythm.   Pulmonary/Chest: Effort normal and breath sounds normal.  Abdominal: Soft. Bowel sounds are normal. He exhibits no mass. There is no tenderness.  Skin: Skin is warm and dry.  Psychiatric: He has a normal mood and affect.          Assessment & Plan:  Dizziness:  75 year old male with  extensive/complicated medical history. Present intermittently for the past 4 weeks. Negative orthostatic vitals today. UA: Negative Normal vital signs without fever or tachycardia. Exam unremarkable. Non tender to abdomen. CBC stable and normal for patient baseline. CMP stable per patient baseline. A1C  at 8.4 which is above goal. Could be elevated sugars causing dizziness. Could also be daily nitroglycerin.  Discussed case with PCP today. Recommended he call his cardiologist and endocrinologist regarding dizziness and lab results. He is to notify us if dizziness becomes worse or does not dissipate. Unsure of etiology for dizziness today. Strict return precautions provided.

## 2015-04-14 NOTE — Patient Instructions (Addendum)
Your urine test was normal.   Your blood pressure looks good.  Complete lab work prior to leaving today. I will notify you of your results once received.   Call cardiology regarding your dizziness as this may be contributed to the nitroglycerin tablet that was restarted.   Consider taking a daily stool softener such as colace, to help with constipation.   I'll be in touch with you later today.  It was a pleasure meeting you!

## 2015-04-14 NOTE — Progress Notes (Signed)
Pre visit review using our clinic review tool, if applicable. No additional management support is needed unless otherwise documented below in the visit note. 

## 2015-05-09 ENCOUNTER — Other Ambulatory Visit: Payer: Self-pay | Admitting: Cardiovascular Disease

## 2015-05-09 DIAGNOSIS — I5032 Chronic diastolic (congestive) heart failure: Secondary | ICD-10-CM | POA: Diagnosis not present

## 2015-05-12 ENCOUNTER — Other Ambulatory Visit: Payer: Self-pay | Admitting: Cardiovascular Disease

## 2015-05-13 ENCOUNTER — Other Ambulatory Visit: Payer: Self-pay | Admitting: *Deleted

## 2015-05-13 DIAGNOSIS — E876 Hypokalemia: Secondary | ICD-10-CM

## 2015-05-13 MED ORDER — POTASSIUM CHLORIDE CRYS ER 20 MEQ PO TBCR: EXTENDED_RELEASE_TABLET | ORAL | Status: DC

## 2015-05-16 DIAGNOSIS — Z794 Long term (current) use of insulin: Secondary | ICD-10-CM | POA: Diagnosis not present

## 2015-05-16 DIAGNOSIS — E1165 Type 2 diabetes mellitus with hyperglycemia: Secondary | ICD-10-CM | POA: Diagnosis not present

## 2015-05-16 DIAGNOSIS — E1142 Type 2 diabetes mellitus with diabetic polyneuropathy: Secondary | ICD-10-CM | POA: Diagnosis not present

## 2015-05-16 DIAGNOSIS — E1122 Type 2 diabetes mellitus with diabetic chronic kidney disease: Secondary | ICD-10-CM | POA: Diagnosis not present

## 2015-05-16 DIAGNOSIS — E782 Mixed hyperlipidemia: Secondary | ICD-10-CM | POA: Diagnosis not present

## 2015-05-16 DIAGNOSIS — E559 Vitamin D deficiency, unspecified: Secondary | ICD-10-CM | POA: Diagnosis not present

## 2015-05-16 DIAGNOSIS — N183 Chronic kidney disease, stage 3 (moderate): Secondary | ICD-10-CM | POA: Diagnosis not present

## 2015-05-17 DIAGNOSIS — L97821 Non-pressure chronic ulcer of other part of left lower leg limited to breakdown of skin: Secondary | ICD-10-CM | POA: Diagnosis not present

## 2015-05-17 DIAGNOSIS — I890 Lymphedema, not elsewhere classified: Secondary | ICD-10-CM | POA: Diagnosis not present

## 2015-05-17 DIAGNOSIS — Z794 Long term (current) use of insulin: Secondary | ICD-10-CM | POA: Diagnosis not present

## 2015-05-17 DIAGNOSIS — I872 Venous insufficiency (chronic) (peripheral): Secondary | ICD-10-CM | POA: Diagnosis not present

## 2015-05-17 DIAGNOSIS — L97929 Non-pressure chronic ulcer of unspecified part of left lower leg with unspecified severity: Secondary | ICD-10-CM | POA: Diagnosis not present

## 2015-05-17 DIAGNOSIS — Z7982 Long term (current) use of aspirin: Secondary | ICD-10-CM | POA: Diagnosis not present

## 2015-05-17 DIAGNOSIS — L97811 Non-pressure chronic ulcer of other part of right lower leg limited to breakdown of skin: Secondary | ICD-10-CM | POA: Diagnosis not present

## 2015-05-17 DIAGNOSIS — E11622 Type 2 diabetes mellitus with other skin ulcer: Secondary | ICD-10-CM | POA: Diagnosis not present

## 2015-05-19 ENCOUNTER — Other Ambulatory Visit: Payer: Self-pay | Admitting: *Deleted

## 2015-05-19 MED ORDER — TRAMADOL HCL 50 MG PO TABS: ORAL_TABLET | ORAL | Status: DC

## 2015-05-19 NOTE — Telephone Encounter (Signed)
Medication phoned to pharmacy.  

## 2015-05-19 NOTE — Telephone Encounter (Signed)
Please call in.  Thanks.   

## 2015-05-19 NOTE — Telephone Encounter (Signed)
Faxed refill request. Last Filled:    60 tablet 1 02/09/2015  Last office visit:   04/14/15 with Anda Kraft.  Please advise.

## 2015-05-24 ENCOUNTER — Telehealth: Payer: Self-pay | Admitting: Cardiovascular Disease

## 2015-05-24 DIAGNOSIS — E1142 Type 2 diabetes mellitus with diabetic polyneuropathy: Secondary | ICD-10-CM | POA: Diagnosis not present

## 2015-05-24 DIAGNOSIS — N183 Chronic kidney disease, stage 3 (moderate): Secondary | ICD-10-CM | POA: Diagnosis not present

## 2015-05-24 DIAGNOSIS — I872 Venous insufficiency (chronic) (peripheral): Secondary | ICD-10-CM | POA: Diagnosis not present

## 2015-05-24 DIAGNOSIS — E11622 Type 2 diabetes mellitus with other skin ulcer: Secondary | ICD-10-CM | POA: Diagnosis not present

## 2015-05-24 DIAGNOSIS — L97221 Non-pressure chronic ulcer of left calf limited to breakdown of skin: Secondary | ICD-10-CM | POA: Diagnosis not present

## 2015-05-24 DIAGNOSIS — L97811 Non-pressure chronic ulcer of other part of right lower leg limited to breakdown of skin: Secondary | ICD-10-CM | POA: Diagnosis not present

## 2015-05-24 DIAGNOSIS — L97211 Non-pressure chronic ulcer of right calf limited to breakdown of skin: Secondary | ICD-10-CM | POA: Diagnosis not present

## 2015-05-24 DIAGNOSIS — L97821 Non-pressure chronic ulcer of other part of left lower leg limited to breakdown of skin: Secondary | ICD-10-CM | POA: Diagnosis not present

## 2015-05-24 DIAGNOSIS — Z7982 Long term (current) use of aspirin: Secondary | ICD-10-CM | POA: Diagnosis not present

## 2015-05-24 DIAGNOSIS — I890 Lymphedema, not elsewhere classified: Secondary | ICD-10-CM | POA: Diagnosis not present

## 2015-05-24 DIAGNOSIS — E1165 Type 2 diabetes mellitus with hyperglycemia: Secondary | ICD-10-CM | POA: Diagnosis not present

## 2015-05-24 DIAGNOSIS — Z794 Long term (current) use of insulin: Secondary | ICD-10-CM | POA: Diagnosis not present

## 2015-05-24 DIAGNOSIS — E1122 Type 2 diabetes mellitus with diabetic chronic kidney disease: Secondary | ICD-10-CM | POA: Diagnosis not present

## 2015-05-24 NOTE — Telephone Encounter (Signed)
Pt c/o Shortness Of Breath: STAT if SOB developed within the last 24 hours or pt is noticeably SOB on the phone   PA calling to update on pt health  1. Are you currently SOB (can you hear that pt is SOB on the phone)? yes  2. How long have you been experiencing SOB? MONTH  3. Are you SOB when sitting or when up moving around? MOVING AROUND  4. Are you currently experiencing any other symptoms? Near SYNCOPE   Offered 2/28 with scott and she wants pt seen sooner, called : triage RN-RN and -DOD

## 2015-05-25 ENCOUNTER — Ambulatory Visit (INDEPENDENT_AMBULATORY_CARE_PROVIDER_SITE_OTHER): Payer: Medicare Other | Admitting: Physician Assistant

## 2015-05-25 ENCOUNTER — Encounter: Payer: Self-pay | Admitting: Physician Assistant

## 2015-05-25 VITALS — BP 110/52 | HR 59 | Ht 70.0 in | Wt 217.0 lb

## 2015-05-25 DIAGNOSIS — R42 Dizziness and giddiness: Secondary | ICD-10-CM | POA: Diagnosis not present

## 2015-05-25 DIAGNOSIS — I25810 Atherosclerosis of coronary artery bypass graft(s) without angina pectoris: Secondary | ICD-10-CM | POA: Diagnosis not present

## 2015-05-25 DIAGNOSIS — I050 Rheumatic mitral stenosis: Secondary | ICD-10-CM | POA: Diagnosis not present

## 2015-05-25 DIAGNOSIS — Z79899 Other long term (current) drug therapy: Secondary | ICD-10-CM | POA: Diagnosis not present

## 2015-05-25 LAB — BASIC METABOLIC PANEL
BUN: 51 mg/dL — AB (ref 7–25)
CHLORIDE: 92 mmol/L — AB (ref 98–110)
CO2: 33 mmol/L — AB (ref 20–31)
Calcium: 9.4 mg/dL (ref 8.6–10.3)
Creat: 2.03 mg/dL — ABNORMAL HIGH (ref 0.70–1.18)
GLUCOSE: 189 mg/dL — AB (ref 65–99)
POTASSIUM: 3.8 mmol/L (ref 3.5–5.3)
SODIUM: 137 mmol/L (ref 135–146)

## 2015-05-25 NOTE — Patient Instructions (Signed)
Your physician recommends that you schedule a follow-up appointment in: Keep Appointment with Dr [name] on 06/03/2015 at 2:45 pm  Your physician recommends that you return for lab work in: [facility]

## 2015-05-25 NOTE — Progress Notes (Addendum)
Cardiology Office Note   Date:  05/25/2015   ID:  [name] Kalicki., DOB [birth year], MRN [id number]  PCP:  [name], MD  Cardiologist:  Dr. [name]   Chief Complaint  Patient presents with  . Follow-up    seen for Dr. [name]  . Dizziness      History of Present Illness: [name] [name]. is a 75 y.o. male who presents for cardiology follow-up. He has history of DM II, stage III CKD, CAD s/p CABG and PCI, rheumatic heart disease, and chronic diastolic heart failure. He underwent 6 vessel CABG in 1998. He has undergone multiple PCI procedure, most recently with rotational atherectomy and bare metal stent of native RCA in 2014. He underwent TAVR on 06/16/2013. He was admitted in December 2016 for acute on chronic diastolic heart failure in the setting of several missed doses of his metolazone. He has been treated with IV Lasix with resolution of symptoms. 2-D echo showed EF 0000000, grade 2 diastolic dysfunction. Mitral valve was severely thickened with severity calcified leaflet and severe stenosis, mild regurg. There was no significant change compared to the previous study in April 2016.  He presents today as an add-on for flex clinic. Wife mentions gradual decline in the last 2 month with episodes of dizziness most often occur during ambulation right after body position change. He denies any SOB and has been compliant with his medication. He denies any presyncope. He says he can hardly walk from one side of room to another. He was seen by endocrinology on 05/16/2015 when noted worsening leg ulcers, he was given an antibiotic which has significantly improved his symptoms. He was seen by wound care yesterday and has new dressing placed on the leg. According to his wife, his wound has significantly improved. As for the dizziness, she is not sure what was causing it.      Past Medical History  Diagnosis Date  . GERD (gastroesophageal reflux disease)   . Hyperlipidemia   . Hypertension    . Diabetes mellitus   . Carotid artery disease ([place])   . Cellulitis and abscess of leg, except foot   . Leg edema   . Right knee sprain   . Cramps, muscle, general   . Low back pain   . Hiatal hernia   . Chills   . Hearing loss   . Nasal congestion   . Cough   . Wheezing   . Generalized headaches   . CAD (coronary artery disease) 11/05/1996    a. CABG x6 by Dr Redmond Pulling - LIMA to Diag+LAD, SVG to OM1+OM2, SVG to PDA+RPL, b. VG->RCA known to be occluded;  c. 05/2012 PCI/Rota/BMS to m/dRCA w/ 3.5x15 Vision BMS x 2 post-dil to 3.75.  Marland Kitchen Mitral stenosis     a. moderate by echo 04/2012.  Marland Kitchen Chronic diastolic congestive heart failure ([place])     a. 04/2012 Echo: EF 55-60-%  . Aortic stenosis     a. 04/2012 s/p baloon valvuloplasty w/ reduction in gradient from 47 to 27.  . Pulmonary hypertension ([place])     a. 05/2012 RH cath: PA 97/32(54)  . Venous insufficiency (chronic) (peripheral)   . Sleep apnea     no cpap  sleep study >5 yrs  . Shortness of breath   . Anxiety   . Arthritis   . 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  Fracture of humerus, proximal, right, closed 2016  . On home oxygen therapy     "2L 4PM til 8:30AM qd" (02/10/2015)    Past Surgical History  Procedure Laterality Date  . Ett cardiolite  12/31/2006    Mild-Mod distal inf/apical ischemia  . Open heart surgery    . Acute or chronic diastolic hf  [id number] - [id number]    RLE cellulitis - HOSP  . Doppler echocardiography  10/11/2008    Mild AS, Mild-Mod MS, Severe LVH  . Le arterial and venous US  10/11/2008    Art clear.  Venous Neg DVT  . Appendectomy  12/25/2010    Dr Redmond Pulling  . Umbilical hernia repair  12/25/2010    Dr Redmond Pulling  . Hernia repair  [id number]    umbilical hernia repair   . Tee without cardioversion  03/20/2012    Procedure: TRANSESOPHAGEAL ECHOCARDIOGRAM (TEE);  Surgeon: [name], MD;   Location: [place];  Service: Cardiovascular;  Laterality: N/A;  . Cardiac catheterization    . Coronary artery bypass graft    . Transcatheter aortic valve replacement, transfemoral N/A 06/16/2013    Procedure: TRANSCATHETER AORTIC VALVE REPLACEMENT, TRANSFEMORAL;  Surgeon: [name], MD;  Location: [place];  Service: Open Heart Surgery;  Laterality: N/A;  . Intraoperative transesophageal echocardiogram N/A 06/16/2013    Procedure: INTRAOPERATIVE TRANSESOPHAGEAL ECHOCARDIOGRAM;  Surgeon: [name], MD;  Location: [facility] OR;  Service: Open Heart Surgery;  Laterality: N/A;  . Percutaneous coronary rotoblator intervention (pci-r) N/A 05/07/2012    Procedure: PERCUTANEOUS CORONARY ROTOBLATOR INTERVENTION (PCI-R);  Surgeon: [name], MD;  Location: [facility] CATH LAB;  Service: Cardiovascular;  Laterality: N/A;  . Left and right heart catheterization with coronary angiogram N/A 03/20/2013    Procedure: LEFT AND RIGHT HEART CATHETERIZATION WITH CORONARY ANGIOGRAM;  Surgeon: Blane Ohara, MD;  Location: [facility] CATH LAB;  Service: Cardiovascular;  Laterality: N/A;     Current Outpatient Prescriptions  Medication Sig Dispense Refill  . ALPRAZolam (XANAX) 0.25 MG tablet Take 1 tablet (0.25 mg total) by mouth 2 (two) times daily as needed for anxiety. 10 tablet 0  . aspirin EC 81 MG tablet Take 81 mg by mouth daily.    . cephALEXin (KEFLEX) 500 MG capsule Take 500 mg by mouth 4 (four) times daily.    . clopidogrel (PLAVIX) 75 MG tablet Take 75 mg by mouth daily with breakfast.    . colchicine 0.6 MG tablet Take 1 tablet (0.6 mg total) by mouth daily as needed (for gout). 20 tablet 0  . doxycycline (VIBRA-TABS) 100 MG tablet Take 1 tablet (100 mg total) by mouth 2 (two) times daily. 20 tablet 0  . fish oil-omega-3 fatty acids 1000 MG capsule Take 1 g by mouth 2 (two) times daily.     . furosemide (LASIX) 80 MG tablet TAKE 2 TABLETS BY MOUTH IN THE MORNING AND 1 TABLET IN THE EVENING 90 tablet 10  .  isosorbide mononitrate (IMDUR) 30 MG 24 hr tablet Take 0.5 tablets (15 mg total) by mouth daily. 30 tablet 5  . JANUVIA 100 MG tablet Take 100 mg by mouth at bedtime.     . methocarbamol (ROBAXIN) 500 MG tablet Take 1 tablet (500 mg total) by mouth daily as needed (leg cramps). 30 tablet 1  . metolazone (ZAROXOLYN) 2.5 MG tablet Take 1 tablet (2.5 mg total) by mouth every other day. 30 tablet 4  . NITROSTAT 0.4 MG SL tablet DISSOLVE 1 TABLET UNDER THE TONGUE FOR CHEST  PAIN. MAY REPEAT EVERY 5MINUTES UP TO 3 DOSES. IF NO RELIEF, CALL 911** 25 tablet 1  . NOVOLOG 100 UNIT/ML injection Inject 10 Units into the skin 3 (three) times daily with meals. 10 units + meal carb count    . pantoprazole (PROTONIX) 40 MG tablet Take 1 tablet (40 mg total) by mouth every morning. 90 tablet 3  . polyethylene glycol powder (GLYCOLAX/MIRALAX) powder Take 17 g by mouth daily as needed (for constipation). 850 g 1  . potassium chloride SA (K-DUR,KLOR-CON) 20 MEQ tablet Take 40 mEq by mouth 2 (two) times daily. Take 2 tablets (40 mEq) by mouth with breakfast and dinner. Take 3 tablets (60 mEq) by mouth at bedtime. FOR A TOTAL OF 7 TABLETS DAILY.    Marland Kitchen Powders (ANTI MONKEY BUTT) POWD Apply 1 application topically as needed (burning itching drying up). Apply to legs and rub in    . ranitidine (ZANTAC) 150 MG tablet Take 150 mg by mouth 2 (two) times daily.     . repaglinide (PRANDIN) 2 MG tablet Take 2 mg by mouth 3 (three) times daily before meals.     . sertraline (ZOLOFT) 100 MG tablet Take 1 tablet (100 mg total) by mouth daily. 90 tablet 1  . TOUJEO SOLOSTAR 300 UNIT/ML SOPN Inject 50 Units into the skin 3 (three) times daily with meals.     . traMADol (ULTRAM) 50 MG tablet TAKE 1-2 TABLETS BY MOUTH EVERY 12 HOURS AS NEEDED FOR SEVERE PAIN (MAX 4 PILLS PER DAY) 60 tablet 1  . Vitamin D, Ergocalciferol, (DRISDOL) 50000 UNITS CAPS Take 50,000 Units by mouth every 7 (seven) days. Take every week on saturday    . ZETIA 10 MG  tablet TAKE ONE TABLET BY MOUTH EACH EVENING 90 tablet 0  . rosuvastatin (CRESTOR) 20 MG tablet Take 1 tablet (20 mg total) by mouth at bedtime. 30 tablet 6  . [DISCONTINUED] promethazine (PHENERGAN) 25 MG tablet Take 1/2 to 1 tablet up to every 6 hours as needed for nausea.      No current facility-administered medications for this visit.    Allergies:   Adhesive and Celecoxib    Social History:  The patient  reports that he has never smoked. He has never used smokeless tobacco. He reports that he does not drink alcohol or use illicit drugs.   Family History:  The patient's family history includes Diabetes in his father and son; Emphysema in his mother; Heart disease in his father; Other in his son.    ROS:  Please see the history of present illness.   Otherwise, review of systems are positive for dizziness.   All other systems are reviewed and negative.    PHYSICAL EXAM: VS:  BP 110/52 mmHg  Pulse 59  Ht 5\' 10"  (1.778 m)  Wt 217 lb (98.431 kg)  BMI 31.14 kg/m2 , BMI Body mass index is 31.14 kg/(m^2). GEN: Well nourished, well developed, in no acute distress HEENT: normal Neck: no JVD, carotid bruits, or masses Cardiac: RRR; no murmurs, rubs, or gallops,no edema  Respiratory:  clear to auscultation bilaterally, normal work of breathing GI: soft, nontender, nondistended, + BS MS: no deformity or atrophy Skin: LE wrapped in 2 layer of dressing. Neuro:  Strength and sensation are intact Psych: euthymic mood, full affect   EKG:  EKG is ordered today. The ekg ordered today demonstrates NSR with 1st degree AV block and PVCs, no obvious ST-T wave changes, HR 50s   Recent Labs: 06/14/2014:  Pro B Natriuretic peptide (BNP) 608.0* 04/14/2015: ALT 9; BUN 49*; Creatinine, Ser 1.88*; Hemoglobin 10.1*; Platelets 261.0; Potassium 3.5; Sodium 135    Lipid Panel No results found for: CHOL, TRIG, HDL, CHOLHDL, VLDL, LDLCALC, LDLDIRECT    Wt Readings from Last 3 Encounters:  05/25/15 217  lb (98.431 kg)  04/14/15 220 lb 1.9 oz (99.846 kg)  02/25/15 218 lb (98.884 kg)      Other studies Reviewed: Additional studies/ records that were reviewed today include:  02/2015  LV EF: 55% - 60%  ------------------------------------------------------------------- Indications: Chest pain 786.51.  ------------------------------------------------------------------- History: PMH: Dyspnea. Mitral valve disease. Aortic valve disease. Risk factors: Hypertension. Diabetes mellitus. Dyslipidemia.  ------------------------------------------------------------------- Study Conclusions  - Left ventricle: Apical septal, apical inferior and apex are  akinetic. No thombus is seen. The cavity size was normal. There  was severe concentric hypertrophy. Systolic function was normal.  The estimated ejection fraction was in the range of 55% to 60%.  Wall motion was normal; there were no regional wall motion  abnormalities. Features are consistent with a pseudonormal left  ventricular filling pattern, with concomitant abnormal relaxation  and increased filling pressure (grade 2 diastolic dysfunction).  Doppler parameters are consistent with elevated ventricular  end-diastolic filling pressure. - Ventricular septum: The contour showed diastolic flattening and  systolic flattening. - Aortic valve: A bioprosthetic valve sits well in the aortic  position. Transortic gradients are normal and unchanged from  prior. There is trivila central aortic regurgitation and no  paravalvular leak. There was trivial regurgitation. Mean gradient  (S): 9 mm Hg. Peak gradient (S): 18 mm Hg. Valve area (VTI): 2.32  cm^2. Valve area (Vmax): 1.97 cm^2. Valve area (Vmean): 2.11  cm^2. - Aortic root: The aortic root was normal in size. - Ascending aorta: The ascending aorta was normal in size. - Mitral valve: Calcified annulus. Severely thickened, severely  calcified leaflets . The  findings are consistent with severe  stenosis. There was mild regurgitation. Mean gradient (D): 20 mm  Hg. Peak gradient 31 mmHg. Valve area by pressure half-time: 1.58  cm^2. Valve area by continuity equation (using LVOT flow): 0.74  cm^2. - Left atrium: The atrium was severely dilated. - Right ventricle: The cavity size was moderately dilated. Wall  thickness was normal. Systolic function was moderately reduced.  Impressions:  - There is no significant difference when compared to the prior  study from 06/2014, regional wall motion in the apex were present  on the prior study as well. Overall LVEF preserved at 55-60%. RV  remains moderately dilated with moderately reduced systolic  function.   Review of the above records demonstrates:   Admitted for HF in Dec 2016, known TAVR and residual severe MS presented for evaluation of severe dizziness   ASSESSMENT AND PLAN:  1.  Dizziness  - Patient is having episodic dizziness that occur mainly when he is standing up and tried to walk. Orthostatic vital signs obtained in the office today shows: Resting 124/62 HR 59, Sitting 106/55 HR 58, Standing 0 min 107/51 HR 64, Standing 3 min 107/ 52 HR 65, positive by diastolic criteria (drop of > 10 mmHg diastolic BP) but not by systolic criteria (drop of [id number] mmHg systolic BP). Question if his symptom is really related to severe MS. He did not have any dizziness during orthostatic hypotension assessment. His only BP medication is Imdur 15mg , given his 6v CABG, i am very hesitant to remove Imdur. Discussed with wife, would focus on fall prevention and conservative therapy.  Alternative explaination for dizziness could be over diuresis, will check BMET. Per wife, when he was seen by endocrinologist on 3/13, his BP was low. I am not sure if there is anything we can do to help in this case unless there is a way to fix his severe MS, but his MS is likely not amenable for invasive intervention per Dr.  [name]. The fact he says his dizziness is worse on the full stomach make hypoglycemia less likely.  - it appears he is pending echo next Monday and has followup with Dr. [name], I would keep it. Although I do not see any note indicating why the echo was repeated after 3 month.  2. Severe MS: likely partially contribute to his dizziness, cannot add BB due to borderline BP and concern to make orthostatic hypotension worse  3. CAD s/p 6v CABG 1998 and PCI  4. chronic diastolic heart failure: euvolemic on exam, lung clear, weight unchanged, check BMET  - on 160mg  AM/80mg  PM lasix with every other day metolazone  5. DM II: on insulin, followed by endocrinology  6. stage III CKD: recheck BMET   Current medicines are reviewed at length with the patient today.  The patient does not have concerns regarding medicines.  The following changes have been made:  no change  Labs/ tests ordered today include:   Orders Placed This Encounter  Procedures  . Basic metabolic panel  . EKG 12-Lead     Disposition:   FU with Dr. [name] in 1 week  Signed, Almyra Deforest, [place]  05/25/2015 5:26 PM    [address], [place], [place]  [address] Phone: [phone]; Fax: [phone]

## 2015-05-30 ENCOUNTER — Ambulatory Visit (HOSPITAL_COMMUNITY): Payer: Medicare Other | Attending: Cardiology

## 2015-05-30 ENCOUNTER — Other Ambulatory Visit: Payer: Self-pay

## 2015-05-30 DIAGNOSIS — R0600 Dyspnea, unspecified: Secondary | ICD-10-CM | POA: Diagnosis not present

## 2015-05-30 DIAGNOSIS — I052 Rheumatic mitral stenosis with insufficiency: Secondary | ICD-10-CM | POA: Insufficient documentation

## 2015-05-30 DIAGNOSIS — I350 Nonrheumatic aortic (valve) stenosis: Secondary | ICD-10-CM

## 2015-05-30 DIAGNOSIS — I351 Nonrheumatic aortic (valve) insufficiency: Secondary | ICD-10-CM | POA: Insufficient documentation

## 2015-05-30 DIAGNOSIS — Z953 Presence of xenogenic heart valve: Secondary | ICD-10-CM | POA: Insufficient documentation

## 2015-05-30 DIAGNOSIS — I517 Cardiomegaly: Secondary | ICD-10-CM | POA: Insufficient documentation

## 2015-05-30 DIAGNOSIS — I5032 Chronic diastolic (congestive) heart failure: Secondary | ICD-10-CM

## 2015-05-30 DIAGNOSIS — N189 Chronic kidney disease, unspecified: Secondary | ICD-10-CM | POA: Diagnosis not present

## 2015-05-30 DIAGNOSIS — E1122 Type 2 diabetes mellitus with diabetic chronic kidney disease: Secondary | ICD-10-CM | POA: Insufficient documentation

## 2015-05-31 ENCOUNTER — Ambulatory Visit: Payer: Medicare Other | Admitting: Physician Assistant

## 2015-05-31 DIAGNOSIS — L97811 Non-pressure chronic ulcer of other part of right lower leg limited to breakdown of skin: Secondary | ICD-10-CM | POA: Diagnosis not present

## 2015-05-31 DIAGNOSIS — Z794 Long term (current) use of insulin: Secondary | ICD-10-CM | POA: Diagnosis not present

## 2015-05-31 DIAGNOSIS — L97211 Non-pressure chronic ulcer of right calf limited to breakdown of skin: Secondary | ICD-10-CM | POA: Diagnosis not present

## 2015-05-31 DIAGNOSIS — E11622 Type 2 diabetes mellitus with other skin ulcer: Secondary | ICD-10-CM | POA: Diagnosis not present

## 2015-05-31 DIAGNOSIS — L97821 Non-pressure chronic ulcer of other part of left lower leg limited to breakdown of skin: Secondary | ICD-10-CM | POA: Diagnosis not present

## 2015-05-31 DIAGNOSIS — I872 Venous insufficiency (chronic) (peripheral): Secondary | ICD-10-CM | POA: Diagnosis not present

## 2015-05-31 DIAGNOSIS — Z7982 Long term (current) use of aspirin: Secondary | ICD-10-CM | POA: Diagnosis not present

## 2015-05-31 DIAGNOSIS — I890 Lymphedema, not elsewhere classified: Secondary | ICD-10-CM | POA: Diagnosis not present

## 2015-05-31 DIAGNOSIS — L97221 Non-pressure chronic ulcer of left calf limited to breakdown of skin: Secondary | ICD-10-CM | POA: Diagnosis not present

## 2015-05-31 NOTE — Telephone Encounter (Signed)
Informed pt and wife of lab results. Verbalized understanding.  Pt was seen by Almyra Deforest, PA-C on 3/22 in regards to note at the bottom.

## 2015-06-03 ENCOUNTER — Ambulatory Visit (INDEPENDENT_AMBULATORY_CARE_PROVIDER_SITE_OTHER): Payer: Medicare Other | Admitting: Cardiovascular Disease

## 2015-06-03 ENCOUNTER — Encounter: Payer: Self-pay | Admitting: Cardiovascular Disease

## 2015-06-03 VITALS — BP 130/50 | HR 72 | Ht 70.0 in | Wt 212.2 lb

## 2015-06-03 DIAGNOSIS — Z954 Presence of other heart-valve replacement: Secondary | ICD-10-CM | POA: Diagnosis not present

## 2015-06-03 DIAGNOSIS — I050 Rheumatic mitral stenosis: Secondary | ICD-10-CM

## 2015-06-03 DIAGNOSIS — I5032 Chronic diastolic (congestive) heart failure: Secondary | ICD-10-CM | POA: Diagnosis not present

## 2015-06-03 DIAGNOSIS — Z952 Presence of prosthetic heart valve: Secondary | ICD-10-CM

## 2015-06-03 NOTE — Progress Notes (Signed)
Cardiology Office Note Date:  06/03/2015   ID:  [name] Deeds., DOB [birth year], MRN [id number]  PCP:  [name] Stain, MD  Cardiologist:  [name] Mocha, MD    Chief Complaint  [name] presents with  . Coronary [name] Disease    c/o le edema. denies cp  . Shortness of Breath     History of Present Illness: [name] A Kaige Evins. is a 75 y.o. male who presents for follow-up evaluation. He has been followed for coronary disease, rheumatic heart disease, and CHF.   He underwent 6 vessel CABG in 1998. He's undergone multiple PCI procedures, most recently with rotational atherectomy and bare-metal stenting of [name] [name] in 2014. [name] [name] has developed advanced valvular heart disease with both rheumatic aortic and mitral stenosis. 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 He had an uncomplicated postoperative course. Other comorbid medical conditions include longstanding Type 2 diabetes, stage 3 CKD, and chronic diastolic heart failure.  He continues to have symptoms of exertional dyspnea and lightheadedness with activity. Symptoms occur with modest physical activity. He also notes symptoms when he doesn't wear O2. No chest pain or pressure with physical exertion. Leg swelling is improved with local measures/compression. No orthopnea or PND.   Past Medical History  Diagnosis Date  . GERD (gastroesophageal reflux disease)   . Hyperlipidemia   . Hypertension   . Diabetes mellitus   . 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 Cellulitis and abscess of leg, except foot   . Leg edema   . Right knee sprain   . Cramps, muscle, general   . Low back pain   . Hiatal hernia   . Chills   . Hearing loss   . Nasal congestion   . Cough   . Wheezing   . Generalized headaches   . 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 CABG x6 by Dr Redmond Pulling - LIMA to Diag+LAD, SVG to OM1+OM2, SVG to PDA+RPL, b. VG->RCA known to be occluded;  c. 05/2012  PCI/Rota/BMS to m/dRCA w/ 3.5x15 Vision BMS x 2 post-dil to 3.75.  Marland Kitchen Mitral stenosis     a. moderate by echo 04/2012.  Marland Kitchen Chronic diastolic congestive heart failure ([place])     a. 04/2012 Echo: EF 55-60-%  . Aortic stenosis     a. 04/2012 s/p baloon valvuloplasty w/ reduction in gradient from 47 to 27.  . Pulmonary hypertension ([place])     a. 05/2012 RH cath: PA 97/32(54)  . Venous insufficiency (chronic) (peripheral)   . Sleep apnea     no cpap  sleep study >5 yrs  . Shortness of breath   . Anxiety   . Arthritis   . 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 Fracture of humerus, proximal, right, closed 2016  . On home oxygen therapy     "2L 4PM til 8:30AM qd" (02/10/2015)    Past Surgical History  Procedure Laterality Date  . Ett cardiolite  12/31/2006    Mild-Mod distal inf/apical ischemia  . Open heart surgery    . Acute or chronic diastolic hf  [id number] - [id number]    RLE cellulitis - HOSP  . Doppler echocardiography  10/11/2008    Mild AS, Mild-Mod MS, Severe LVH  . Le arterial and venous US  10/11/2008    Art clear.  Venous Neg DVT  .  Appendectomy  12/25/2010    Dr Redmond Pulling  . Umbilical hernia repair  12/25/2010    Dr Redmond Pulling  . Hernia repair  [id number]    umbilical hernia repair   . Tee without cardioversion  03/20/2012    Procedure: TRANSESOPHAGEAL ECHOCARDIOGRAM (TEE);  Surgeon: [name], MD;  Location: [place];  Service: Cardiovascular;  Laterality: N/A;  . Cardiac catheterization    . Coronary [name] bypass graft    . Transcatheter aortic valve replacement, transfemoral N/A 06/16/2013    Procedure: TRANSCATHETER AORTIC VALVE REPLACEMENT, TRANSFEMORAL;  Surgeon: [name] Mocha, MD;  Location: [place];  Service: Open Heart Surgery;  Laterality: N/A;  . Intraoperative transesophageal echocardiogram N/A 06/16/2013    Procedure: INTRAOPERATIVE TRANSESOPHAGEAL  ECHOCARDIOGRAM;  Surgeon: [name] Mocha, MD;  Location: [facility] OR;  Service: Open Heart Surgery;  Laterality: N/A;  . Percutaneous coronary rotoblator intervention (pci-r) N/A 05/07/2012    Procedure: PERCUTANEOUS CORONARY ROTOBLATOR INTERVENTION (PCI-R);  Surgeon: [name] Mocha, MD;  Location: [facility] CATH LAB;  Service: Cardiovascular;  Laterality: N/A;  . Left and right heart catheterization with coronary angiogram N/A 03/20/2013    Procedure: LEFT AND RIGHT HEART CATHETERIZATION WITH CORONARY ANGIOGRAM;  Surgeon: Blane Ohara, MD;  Location: [facility] CATH LAB;  Service: Cardiovascular;  Laterality: N/A;    Current Outpatient Prescriptions  Medication Sig Dispense Refill  . ALPRAZolam (XANAX) 0.25 MG tablet Take 1 tablet (0.25 mg total) by mouth 2 (two) times daily as needed for anxiety. 10 tablet 0  . aspirin EC 81 MG tablet Take 81 mg by mouth daily.    . cephALEXin (KEFLEX) 500 MG capsule Take 500 mg by mouth 4 (four) times daily.    . clopidogrel (PLAVIX) 75 MG tablet Take 75 mg by mouth daily with breakfast.    . colchicine 0.6 MG tablet Take 1 tablet (0.6 mg total) by mouth daily as needed (for gout). 20 tablet 0  . doxycycline (VIBRA-TABS) 100 MG tablet Take 1 tablet (100 mg total) by mouth 2 (two) times daily. 20 tablet 0  . fish oil-omega-3 fatty acids 1000 MG capsule Take 1 g by mouth 2 (two) times daily.     . furosemide (LASIX) 80 MG tablet TAKE 2 TABLETS BY MOUTH IN [name] MORNING AND 1 TABLET IN [name] EVENING 90 tablet 10  . HUMALOG 100 UNIT/ML injection Inject 10 Units into [name] skin 3 (three) times daily. Baseline stays at 10 units. Adjust dose at needed based on sliding scale and bg readings    . isosorbide mononitrate (IMDUR) 30 MG 24 hr tablet Take 0.5 tablets (15 mg total) by mouth daily. 30 tablet 5  . JANUVIA 100 MG tablet Take 100 mg by mouth at bedtime.     . methocarbamol (ROBAXIN) 500 MG tablet Take 1 tablet (500 mg total) by mouth daily as needed (leg cramps). 30 tablet 1  .  metolazone (ZAROXOLYN) 2.5 MG tablet Take 1 tablet (2.5 mg total) by mouth every other day. 30 tablet 4  . NITROSTAT 0.4 MG SL tablet DISSOLVE 1 TABLET UNDER [name] TONGUE FOR CHEST PAIN. MAY REPEAT EVERY 5MINUTES UP TO 3 DOSES. IF NO RELIEF, CALL 911** 25 tablet 1  . NOVOLOG 100 UNIT/ML injection Inject 10 Units into [name] skin 3 (three) times daily with meals. 10 units + meal carb count    . pantoprazole (PROTONIX) 40 MG tablet Take 1 tablet (40 mg total) by mouth every morning. 90 tablet 3  . polyethylene glycol powder (GLYCOLAX/MIRALAX) powder Take 17 g  by mouth daily as needed (for constipation). 850 g 1  . potassium chloride SA (K-DUR,KLOR-CON) 20 MEQ tablet Take 40 mEq by mouth 2 (two) times daily. Take 2 tablets (40 mEq) by mouth with breakfast and dinner. Take 3 tablets (60 mEq) by mouth at bedtime. FOR A TOTAL OF 7 TABLETS DAILY.    Marland Kitchen Powders (ANTI MONKEY BUTT) POWD Apply 1 application topically as needed (burning itching drying up). Apply to legs and rub in    . ranitidine (ZANTAC) 150 MG tablet Take 150 mg by mouth 2 (two) times daily.     . repaglinide (PRANDIN) 2 MG tablet Take 2 mg by mouth 3 (three) times daily before meals.     . sertraline (ZOLOFT) 100 MG tablet Take 1 tablet (100 mg total) by mouth daily. 90 tablet 1  . TOUJEO SOLOSTAR 300 UNIT/ML SOPN Inject 50 Units into [name] skin 3 (three) times daily with meals.     . traMADol (ULTRAM) 50 MG tablet TAKE 1-2 TABLETS BY MOUTH EVERY 12 HOURS AS NEEDED FOR SEVERE PAIN (MAX 4 PILLS PER DAY) 60 tablet 1  . Vitamin D, Ergocalciferol, (DRISDOL) 50000 UNITS CAPS Take 50,000 Units by mouth every 7 (seven) days. Take every week on saturday    . ZETIA 10 MG tablet TAKE ONE TABLET BY MOUTH EACH EVENING 90 tablet 0  . rosuvastatin (CRESTOR) 20 MG tablet Take 1 tablet (20 mg total) by mouth at bedtime. 30 tablet 6  . [DISCONTINUED] promethazine (PHENERGAN) 25 MG tablet Take 1/2 to 1 tablet up to every 6 hours as needed for nausea.      No current  facility-administered medications for this visit.    Allergies:   Adhesive and Celecoxib   Social History:  [name] [name]  reports that he has never smoked. He has never used smokeless tobacco. He reports that he does not drink alcohol or use illicit drugs.   Family History:  [name] [name]'s  family history includes Diabetes in his father and son; Emphysema in his mother; Heart disease in his father; Other in his son.    ROS:  Please see [name] history of present illness.  Otherwise, review of systems is positive for hearing loss, DOE, constipation.  All other systems are reviewed and negative.    PHYSICAL EXAM: VS:  BP 130/50 mmHg  Pulse 72  Ht 5\' 10"  (1.778 m)  Wt 212 lb 3.2 oz (96.253 kg)  BMI 30.45 kg/m2 , BMI Body mass index is 30.45 kg/(m^2). GEN: Well nourished, well developed, in no acute distress HEENT: normal Neck: JVP elevated, no masses. No carotid bruits Cardiac: RRR with 2/6 systolic murmur at [name] RUSB               Respiratory:  clear to auscultation bilaterally, normal work of breathing GI: soft, nontender, nondistended, + BS MS: no deformity or atrophy Ext: compression wraps in place Skin: warm and dry, no rash Neuro:  Strength and sensation are intact Psych: euthymic mood, full affect  EKG:  EKG is not ordered today.  Recent Labs: 06/14/2014: Pro B Natriuretic peptide (BNP) 608.0* 04/14/2015: ALT 9; Hemoglobin 10.1*; Platelets 261.0 05/25/2015: BUN 51*; Creat 2.03*; Potassium 3.8; Sodium 137   Lipid Panel  No results found for: CHOL, TRIG, HDL, CHOLHDL, VLDL, LDLCALC, LDLDIRECT    Wt Readings from Last 3 Encounters:  06/03/15 212 lb 3.2 oz (96.253 kg)  05/25/15 217 lb (98.431 kg)  04/14/15 220 lb 1.9 oz (99.846 kg)  Cardiac Studies Reviewed: 2D Echo  Study Conclusions  - Left ventricle: [name] cavity size was normal. Wall thickness was  increased in a pattern of severe LVH. Systolic function was  normal. [name] estimated ejection fraction was in [name] range  of 55%  to 60%. Wall motion was normal; there were no regional wall  motion abnormalities. Doppler parameters are consistent with high  ventricular filling pressure. - Ventricular septum: [name] contour showed diastolic flattening and  systolic flattening. - Aortic valve: A bioprosthesis was present. There was trivial  regurgitation. Valve area (VTI): 2.55 cm^2. Valve area (Vmax):  2.36 cm^2. Valve area (Vmean): 2.52 cm^2. - Mitral valve: Severely calcified annulus. [name] findings are  consistent with severe stenosis. Valve area by pressure  half-time: 0.98 cm^2. - Left atrium: [name] atrium was severely dilated. - Right ventricle: [name] cavity size was mildly dilated. Systolic  function was moderately reduced. - Right atrium: [name] atrium was moderately dilated. - Pulmonary arteries: PA peak pressure: 37 mm Hg (S).  Impressions:  - Normal LV systolic function; severe LVH; D shaped septum  suggestive of pulmonary hypertension; elevated LV filling  pressure; biatrial enlargement; mild RVE with moderately reduced  RV function; s/p AVR with mean gradient 6 mmHg and trace AI;  severe MAC with severe MS and trace MR.   ASSESSMENT AND PLAN: 1.  Severe AS s/p TAVR: transcatheter heart valve function is normal by recent echo assessment  2. Severe mitral stenosis: rheumatic valve disease with severe calcification of [name] valve and annulus.   3. Chronic diastolic heart failure, NYHA III: secondary to valvular and ischemic heart disease. Pt with severe MS, pulmonary HTN with progressively worse RV function. Unfortunately there are very limited treatment options. Pt with extensive diffuse CAD and mitral valve features not amenable to BMV. Continue O2, diuretics, CHF RX as tolerated.   4. CAD, native vessel and bypass graft disease: no angina. Stop isosorbide in setting lightheadedness/near syncope.   Current medicines are reviewed with [name] [name] today.  [name] [name] does not have concerns  regarding medicines.  Labs/ tests ordered today include:  No orders of [name] defined types were placed in this encounter.    Disposition:   FU 4 months  Signed, [name] Mocha, MD  06/03/2015 3:07 PM    [facility] [address], [place], [place]  [address] Phone: [phone]; Fax: [phone]

## 2015-06-03 NOTE — Patient Instructions (Signed)
Medication Instructions:  Your physician has recommended you make the following change in your medication:  1. STOP Isosorbide MN  Labwork: No new orders.   Testing/Procedures: No new orders.   Follow-Up: Your physician recommends that you schedule a follow-up appointment in: 4 MONTHS with Dr [name]   Any Other Special Instructions Will Be Listed Below (If Applicable).     If you need a refill on your cardiac medications before your next appointment, please call your pharmacy.

## 2015-06-09 DIAGNOSIS — I5032 Chronic diastolic (congestive) heart failure: Secondary | ICD-10-CM | POA: Diagnosis not present

## 2015-06-14 DIAGNOSIS — L97811 Non-pressure chronic ulcer of other part of right lower leg limited to breakdown of skin: Secondary | ICD-10-CM | POA: Diagnosis not present

## 2015-06-14 DIAGNOSIS — L97211 Non-pressure chronic ulcer of right calf limited to breakdown of skin: Secondary | ICD-10-CM | POA: Diagnosis not present

## 2015-06-14 DIAGNOSIS — I890 Lymphedema, not elsewhere classified: Secondary | ICD-10-CM | POA: Diagnosis not present

## 2015-06-14 DIAGNOSIS — Z7982 Long term (current) use of aspirin: Secondary | ICD-10-CM | POA: Diagnosis not present

## 2015-06-14 DIAGNOSIS — I872 Venous insufficiency (chronic) (peripheral): Secondary | ICD-10-CM | POA: Diagnosis not present

## 2015-06-14 DIAGNOSIS — E11622 Type 2 diabetes mellitus with other skin ulcer: Secondary | ICD-10-CM | POA: Diagnosis not present

## 2015-06-14 DIAGNOSIS — Z794 Long term (current) use of insulin: Secondary | ICD-10-CM | POA: Diagnosis not present

## 2015-06-14 DIAGNOSIS — L97821 Non-pressure chronic ulcer of other part of left lower leg limited to breakdown of skin: Secondary | ICD-10-CM | POA: Diagnosis not present

## 2015-06-15 ENCOUNTER — Other Ambulatory Visit: Payer: Medicare Other

## 2015-06-16 ENCOUNTER — Other Ambulatory Visit: Payer: Self-pay | Admitting: Cardiovascular Disease

## 2015-06-21 DIAGNOSIS — I872 Venous insufficiency (chronic) (peripheral): Secondary | ICD-10-CM | POA: Diagnosis not present

## 2015-06-21 DIAGNOSIS — Z7982 Long term (current) use of aspirin: Secondary | ICD-10-CM | POA: Diagnosis not present

## 2015-06-21 DIAGNOSIS — L97821 Non-pressure chronic ulcer of other part of left lower leg limited to breakdown of skin: Secondary | ICD-10-CM | POA: Diagnosis not present

## 2015-06-21 DIAGNOSIS — E11622 Type 2 diabetes mellitus with other skin ulcer: Secondary | ICD-10-CM | POA: Diagnosis not present

## 2015-06-21 DIAGNOSIS — L97211 Non-pressure chronic ulcer of right calf limited to breakdown of skin: Secondary | ICD-10-CM | POA: Diagnosis not present

## 2015-06-21 DIAGNOSIS — L97811 Non-pressure chronic ulcer of other part of right lower leg limited to breakdown of skin: Secondary | ICD-10-CM | POA: Diagnosis not present

## 2015-06-21 DIAGNOSIS — I890 Lymphedema, not elsewhere classified: Secondary | ICD-10-CM | POA: Diagnosis not present

## 2015-06-21 DIAGNOSIS — Z794 Long term (current) use of insulin: Secondary | ICD-10-CM | POA: Diagnosis not present

## 2015-06-21 DIAGNOSIS — L97929 Non-pressure chronic ulcer of unspecified part of left lower leg with unspecified severity: Secondary | ICD-10-CM | POA: Diagnosis not present

## 2015-07-04 ENCOUNTER — Other Ambulatory Visit: Payer: Self-pay | Admitting: *Deleted

## 2015-07-04 DIAGNOSIS — L97211 Non-pressure chronic ulcer of right calf limited to breakdown of skin: Secondary | ICD-10-CM | POA: Diagnosis not present

## 2015-07-04 DIAGNOSIS — I872 Venous insufficiency (chronic) (peripheral): Secondary | ICD-10-CM | POA: Diagnosis not present

## 2015-07-04 DIAGNOSIS — I890 Lymphedema, not elsewhere classified: Secondary | ICD-10-CM | POA: Diagnosis not present

## 2015-07-04 DIAGNOSIS — L97821 Non-pressure chronic ulcer of other part of left lower leg limited to breakdown of skin: Secondary | ICD-10-CM | POA: Diagnosis not present

## 2015-07-04 DIAGNOSIS — Z794 Long term (current) use of insulin: Secondary | ICD-10-CM | POA: Diagnosis not present

## 2015-07-04 DIAGNOSIS — E11622 Type 2 diabetes mellitus with other skin ulcer: Secondary | ICD-10-CM | POA: Diagnosis not present

## 2015-07-04 DIAGNOSIS — Z7982 Long term (current) use of aspirin: Secondary | ICD-10-CM | POA: Diagnosis not present

## 2015-07-04 DIAGNOSIS — L97811 Non-pressure chronic ulcer of other part of right lower leg limited to breakdown of skin: Secondary | ICD-10-CM | POA: Diagnosis not present

## 2015-07-04 MED ORDER — SERTRALINE HCL 100 MG PO TABS: 100.0000 mg | ORAL_TABLET | Freq: Every day | ORAL | Status: AC

## 2015-07-04 NOTE — Telephone Encounter (Signed)
Sent. Thanks.   

## 2015-07-04 NOTE — Telephone Encounter (Signed)
Faxed refill request. Last Filled:    90 tablet 1 10/25/2014  Last office visit:   01/31/2015  Please advise.

## 2015-07-09 DIAGNOSIS — I5032 Chronic diastolic (congestive) heart failure: Secondary | ICD-10-CM | POA: Diagnosis not present

## 2015-08-08 ENCOUNTER — Other Ambulatory Visit: Payer: Self-pay | Admitting: *Deleted

## 2015-08-08 DIAGNOSIS — R252 Cramp and spasm: Secondary | ICD-10-CM

## 2015-08-08 MED ORDER — METHOCARBAMOL 500 MG PO TABS: 500.0000 mg | ORAL_TABLET | Freq: Every day | ORAL | Status: AC | PRN

## 2015-08-08 NOTE — Telephone Encounter (Signed)
Received faxed refill request from pharmacy Last refill 04/03/15 #30/1 Last office visit 04/14/15 acute

## 2015-08-08 NOTE — Telephone Encounter (Signed)
I sent the Rx to the pharmacy.

## 2015-08-09 DIAGNOSIS — I5032 Chronic diastolic (congestive) heart failure: Secondary | ICD-10-CM | POA: Diagnosis not present

## 2015-08-24 DIAGNOSIS — E782 Mixed hyperlipidemia: Secondary | ICD-10-CM | POA: Diagnosis not present

## 2015-08-24 DIAGNOSIS — E559 Vitamin D deficiency, unspecified: Secondary | ICD-10-CM | POA: Diagnosis not present

## 2015-08-24 DIAGNOSIS — E1165 Type 2 diabetes mellitus with hyperglycemia: Secondary | ICD-10-CM | POA: Diagnosis not present

## 2015-08-25 DIAGNOSIS — N183 Chronic kidney disease, stage 3 (moderate): Secondary | ICD-10-CM | POA: Diagnosis not present

## 2015-08-25 DIAGNOSIS — E1165 Type 2 diabetes mellitus with hyperglycemia: Secondary | ICD-10-CM | POA: Diagnosis not present

## 2015-08-25 DIAGNOSIS — E1142 Type 2 diabetes mellitus with diabetic polyneuropathy: Secondary | ICD-10-CM | POA: Diagnosis not present

## 2015-08-25 DIAGNOSIS — Z794 Long term (current) use of insulin: Secondary | ICD-10-CM | POA: Diagnosis not present

## 2015-08-25 DIAGNOSIS — E1122 Type 2 diabetes mellitus with diabetic chronic kidney disease: Secondary | ICD-10-CM | POA: Diagnosis not present

## 2015-09-08 ENCOUNTER — Other Ambulatory Visit: Payer: Self-pay | Admitting: Cardiovascular Disease

## 2015-09-08 DIAGNOSIS — I5032 Chronic diastolic (congestive) heart failure: Secondary | ICD-10-CM | POA: Diagnosis not present

## 2015-09-12 ENCOUNTER — Encounter: Payer: Self-pay | Admitting: Family Medicine

## 2015-09-12 ENCOUNTER — Ambulatory Visit (INDEPENDENT_AMBULATORY_CARE_PROVIDER_SITE_OTHER): Payer: Medicare Other | Admitting: Family Medicine

## 2015-09-12 VITALS — BP 102/50 | HR 74 | Temp 97.7°F

## 2015-09-12 DIAGNOSIS — M6281 Muscle weakness (generalized): Secondary | ICD-10-CM | POA: Diagnosis not present

## 2015-09-12 DIAGNOSIS — R0602 Shortness of breath: Secondary | ICD-10-CM

## 2015-09-12 DIAGNOSIS — R29898 Other symptoms and signs involving the musculoskeletal system: Secondary | ICD-10-CM

## 2015-09-12 LAB — CBC WITH DIFFERENTIAL/PLATELET
BASOS PCT: 0.2 % (ref 0.0–3.0)
Basophils Absolute: 0 10*3/uL (ref 0.0–0.1)
EOS ABS: 0 10*3/uL (ref 0.0–0.7)
Eosinophils Relative: 0 % (ref 0.0–5.0)
HEMATOCRIT: 29.5 % — AB (ref 39.0–52.0)
Hemoglobin: 9 g/dL — ABNORMAL LOW (ref 13.0–17.0)
LYMPHS ABS: 0.8 10*3/uL (ref 0.7–4.0)
LYMPHS PCT: 9.4 % — AB (ref 12.0–46.0)
MCHC: 30.5 g/dL (ref 30.0–36.0)
Monocytes Absolute: 0.9 10*3/uL (ref 0.1–1.0)
Monocytes Relative: 10.5 % (ref 3.0–12.0)
NEUTROS ABS: 6.7 10*3/uL (ref 1.4–7.7)
NEUTROS PCT: 79.9 % — AB (ref 43.0–77.0)
PLATELETS: 223 10*3/uL (ref 150.0–400.0)
RBC: 4.66 Mil/uL (ref 4.22–5.81)
RDW: 23.1 % — AB (ref 11.5–15.5)
WBC: 8.4 10*3/uL (ref 4.0–10.5)

## 2015-09-12 LAB — COMPREHENSIVE METABOLIC PANEL
ALBUMIN: 3.9 g/dL (ref 3.5–5.2)
ALT: 12 U/L (ref 0–53)
AST: 19 U/L (ref 0–37)
Alkaline Phosphatase: 82 U/L (ref 39–117)
BILIRUBIN TOTAL: 0.9 mg/dL (ref 0.2–1.2)
BUN: 63 mg/dL — ABNORMAL HIGH (ref 6–23)
CALCIUM: 9.4 mg/dL (ref 8.4–10.5)
CO2: 28 mEq/L (ref 19–32)
CREATININE: 2.57 mg/dL — AB (ref 0.40–1.50)
Chloride: 96 mEq/L (ref 96–112)
GFR: 26.19 mL/min — ABNORMAL LOW (ref >60.00)
Glucose, Bld: 154 mg/dL — ABNORMAL HIGH (ref 70–99)
Potassium: 5.7 mEq/L — ABNORMAL HIGH (ref 3.5–5.1)
Sodium: 134 mEq/L — ABNORMAL LOW (ref 135–145)
Total Protein: 7.2 g/dL (ref 6.0–8.3)

## 2015-09-12 LAB — TSH: TSH: 3.37 u[IU]/mL (ref 0.35–4.50)

## 2015-09-12 LAB — BRAIN NATRIURETIC PEPTIDE: PRO B NATRI PEPTIDE: 1387 pg/mL — AB (ref 0.0–100.0)

## 2015-09-12 LAB — CK: Total CK: 53 U/L (ref 7–232)

## 2015-09-12 NOTE — Patient Instructions (Signed)
Take an extra metolazone tomorrow.  Go to the lab on the way out.  We'll contact you with your lab report. We'll go from there.  Take care.  Glad to see you.

## 2015-09-12 NOTE — Progress Notes (Signed)
Pre visit review using our clinic review tool, if applicable. No additional management support is needed unless otherwise documented below in the visit note.  Trouble walking.  Diffusely weak in the BLE.  Some days sx are better than others.  Sometimes he can't walk 20 paces.  Trouble with prolonged standing, more than a few minutes to shave, etc.  He has been getting worse in the last few weeks, overall, but not everyday being worse than prev.  B and equal sx, no unilateral weakness.  He has been having mult episodes of cramping, hands and legs.  More BLE edema recently.    He took Metolazone today.  Was scheduled to take MWF this week.   PMH and SH reviewed  ROS: Per HPI unless specifically indicated in ROS section   Meds, vitals, and allergies reviewed.   In wheelchair at [place].  On O2 at baseline, usually 4L [place].  Recheck pulse ox 92%.  Speaking in complete sentences.   nad ncat Mmm Neck supple rrr Ctab, no focal dec in BS, no dec in BS at the bases.  No inc wob abd soft, not ttp Ext with 1+ BLE edema.   B quad weakness- can be overcome- but not unilateral weakness.

## 2015-09-13 ENCOUNTER — Encounter: Payer: Self-pay | Admitting: Family Medicine

## 2015-09-13 ENCOUNTER — Other Ambulatory Visit: Payer: Self-pay | Admitting: Family Medicine

## 2015-09-13 DIAGNOSIS — E875 Hyperkalemia: Secondary | ICD-10-CM

## 2015-09-13 DIAGNOSIS — R29898 Other symptoms and signs involving the musculoskeletal system: Secondary | ICD-10-CM | POA: Insufficient documentation

## 2015-09-13 NOTE — Assessment & Plan Note (Signed)
Likely deconditioned.  No sign of acute CVA given the equal B sx w/o hand weakness.  Unclear if sx also chronic cardiac limitation, chronic pulmonary limitation, statin, vs other issue.  ddx d/w pt.  At this point, no CP and still okay for outpatient f/u.   Sx going on for weeks.   He took Metolazone today.  Was scheduled to take MWF this week.  With inc in BLE edema, will take Metolazone tomorrow (tuesday) and then again on Wed and Friday.  See notes on labs.   I didn't change meds o/w at OV.   He agrees with plan.

## 2015-09-15 ENCOUNTER — Encounter: Payer: Self-pay | Admitting: Family Medicine

## 2015-09-15 ENCOUNTER — Ambulatory Visit (INDEPENDENT_AMBULATORY_CARE_PROVIDER_SITE_OTHER): Payer: Medicare Other | Admitting: Family Medicine

## 2015-09-15 VITALS — BP 110/56 | HR 72 | Temp 97.5°F

## 2015-09-15 DIAGNOSIS — L97921 Non-pressure chronic ulcer of unspecified part of left lower leg limited to breakdown of skin: Secondary | ICD-10-CM

## 2015-09-15 DIAGNOSIS — L97822 Non-pressure chronic ulcer of other part of left lower leg with fat layer exposed: Secondary | ICD-10-CM | POA: Diagnosis not present

## 2015-09-15 DIAGNOSIS — D649 Anemia, unspecified: Secondary | ICD-10-CM | POA: Diagnosis not present

## 2015-09-15 DIAGNOSIS — I5032 Chronic diastolic (congestive) heart failure: Secondary | ICD-10-CM

## 2015-09-15 DIAGNOSIS — L97812 Non-pressure chronic ulcer of other part of right lower leg with fat layer exposed: Secondary | ICD-10-CM | POA: Diagnosis not present

## 2015-09-15 DIAGNOSIS — Z794 Long term (current) use of insulin: Secondary | ICD-10-CM | POA: Diagnosis not present

## 2015-09-15 DIAGNOSIS — Z7982 Long term (current) use of aspirin: Secondary | ICD-10-CM | POA: Diagnosis not present

## 2015-09-15 LAB — CBC WITH DIFFERENTIAL/PLATELET
BASOS PCT: 0.3 % (ref 0.0–3.0)
Basophils Absolute: 0 10*3/uL (ref 0.0–0.1)
EOS PCT: 0 % (ref 0.0–5.0)
Eosinophils Absolute: 0 10*3/uL (ref 0.0–0.7)
HEMATOCRIT: 30 % — AB (ref 39.0–52.0)
HEMOGLOBIN: 9 g/dL — AB (ref 13.0–17.0)
LYMPHS PCT: 7.3 % — AB (ref 12.0–46.0)
Lymphs Abs: 0.6 10*3/uL — ABNORMAL LOW (ref 0.7–4.0)
MCHC: 30.2 g/dL (ref 30.0–36.0)
MCV: 63.1 fl — ABNORMAL LOW (ref 78.0–100.0)
MONO ABS: 0.8 10*3/uL (ref 0.1–1.0)
MONOS PCT: 9 % (ref 3.0–12.0)
NEUTROS PCT: 83.4 % — AB (ref 43.0–77.0)
Neutro Abs: 7 10*3/uL (ref 1.4–7.7)
PLATELETS: 178 10*3/uL (ref 150.0–400.0)
RBC: 4.75 Mil/uL (ref 4.22–5.81)
RDW: 23.5 % — AB (ref 11.5–15.5)
WBC: 8.4 10*3/uL (ref 4.0–10.5)

## 2015-09-15 LAB — BASIC METABOLIC PANEL
BUN: 61 mg/dL — ABNORMAL HIGH (ref 6–23)
CALCIUM: 9.2 mg/dL (ref 8.4–10.5)
CHLORIDE: 93 meq/L — AB (ref 96–112)
CO2: 32 mEq/L (ref 19–32)
CREATININE: 2.24 mg/dL — AB (ref 0.40–1.50)
GFR: 30.69 mL/min — AB (ref >60.00)
Glucose, Bld: 297 mg/dL — ABNORMAL HIGH (ref 70–99)
Potassium: 3.8 mEq/L (ref 3.5–5.1)
Sodium: 134 mEq/L — ABNORMAL LOW (ref 135–145)

## 2015-09-15 NOTE — Progress Notes (Addendum)
Pre visit review using our clinic review tool, if applicable. No additional management support is needed unless otherwise documented below in the visit note.  CHF.  Swelling is minimally better, with repeated metolazone dose.  His breathing is about the same.  Still deconditioned and wheelchair bound at [place].  Inc in abd girth, from 42" to 44" waist recently.  Fewer cramps recently.    Anemia noted.  No bleeding noted by patient except for a bleed from a vein in R foot about 3 weeks ago. No more bleeding in the meantime.  No black stools.   Sore on L shin noted 2 days ago.  Worse in the meantime.  No fevers.  Prev seen at wound clinic, for similar lesions.    PMH and SH reviewed  ROS: Per HPI unless specifically indicated in ROS section   Meds, vitals, and allergies reviewed.   nad In wheelchair Mmm Neck supple rrr Ctab, no focal dec in BS abd soft, not ttp 1+ BLE edema.  R foot with small <<1cm scabbed lesion at prev bleed site.  L shin with 9x7cm superficial ulcer.  It looks like he had a blister (likely from CHF) that sloughed off.  No spreading erythema.

## 2015-09-15 NOTE — Patient Instructions (Addendum)
Go to the lab on the way out.  We'll contact you with your lab report. [name] [name] will call about your referral. We'll let you know about taking metolazone tomorrow.  We'll be in touch.  Take care.  Glad to see you.

## 2015-09-16 ENCOUNTER — Encounter: Payer: Self-pay | Admitting: Family Medicine

## 2015-09-16 NOTE — Assessment & Plan Note (Addendum)
Recheck labs today, we'll contact him about scheduled dose of metolazone.  Prev with inc in K noted.   Refer to wound clinic re: ulceration on the L shin. Wound dressed.  Doesn't appear infected.  I'll be in contact with cards about his situation.  He doesn't have chest pain.  We had a blunt discussion about his his situation, where we can't "fix" his issues, and he acknowledged that.   We'll see as we go along how much we can improve his functional status.   He and wife agree.   App cards input.   Routed to Dr. [name] in meantime.   >25 minutes spent in face to face time with patient, >50% spent in counselling or coordination of care.

## 2015-09-22 DIAGNOSIS — Z7982 Long term (current) use of aspirin: Secondary | ICD-10-CM | POA: Diagnosis not present

## 2015-09-22 DIAGNOSIS — L97812 Non-pressure chronic ulcer of other part of right lower leg with fat layer exposed: Secondary | ICD-10-CM | POA: Diagnosis not present

## 2015-09-22 DIAGNOSIS — Z794 Long term (current) use of insulin: Secondary | ICD-10-CM | POA: Diagnosis not present

## 2015-09-22 DIAGNOSIS — L97822 Non-pressure chronic ulcer of other part of left lower leg with fat layer exposed: Secondary | ICD-10-CM | POA: Diagnosis not present

## 2015-09-26 DIAGNOSIS — L97812 Non-pressure chronic ulcer of other part of right lower leg with fat layer exposed: Secondary | ICD-10-CM | POA: Diagnosis not present

## 2015-09-26 DIAGNOSIS — Z4800 Encounter for change or removal of nonsurgical wound dressing: Secondary | ICD-10-CM | POA: Diagnosis not present

## 2015-09-27 ENCOUNTER — Ambulatory Visit (INDEPENDENT_AMBULATORY_CARE_PROVIDER_SITE_OTHER): Payer: Medicare Other | Admitting: Cardiovascular Disease

## 2015-09-27 ENCOUNTER — Inpatient Hospital Stay (HOSPITAL_COMMUNITY)
Admission: AD | Admit: 2015-09-27 | Discharge: 2015-10-12 | DRG: 291 | Disposition: A | Discharge status: Hospice | Payer: Medicare Other | Source: Ambulatory Visit | Attending: Cardiovascular Disease | Admitting: Cardiovascular Disease

## 2015-09-27 ENCOUNTER — Encounter: Payer: Self-pay | Admitting: Cardiovascular Disease

## 2015-09-27 VITALS — BP 150/62 | HR 70 | Ht 70.0 in | Wt 231.8 lb

## 2015-09-27 DIAGNOSIS — I5033 Acute on chronic diastolic (congestive) heart failure: Secondary | ICD-10-CM | POA: Diagnosis not present

## 2015-09-27 DIAGNOSIS — Z951 Presence of aortocoronary bypass graft: Secondary | ICD-10-CM

## 2015-09-27 DIAGNOSIS — E11649 Type 2 diabetes mellitus with hypoglycemia without coma: Secondary | ICD-10-CM | POA: Diagnosis not present

## 2015-09-27 DIAGNOSIS — I350 Nonrheumatic aortic (valve) stenosis: Secondary | ICD-10-CM | POA: Diagnosis not present

## 2015-09-27 DIAGNOSIS — F419 Anxiety disorder, unspecified: Secondary | ICD-10-CM | POA: Diagnosis present

## 2015-09-27 DIAGNOSIS — Z794 Long term (current) use of insulin: Secondary | ICD-10-CM

## 2015-09-27 DIAGNOSIS — Z7902 Long term (current) use of antithrombotics/antiplatelets: Secondary | ICD-10-CM | POA: Diagnosis not present

## 2015-09-27 DIAGNOSIS — I25118 Atherosclerotic heart disease of native coronary artery with other forms of angina pectoris: Secondary | ICD-10-CM | POA: Diagnosis not present

## 2015-09-27 DIAGNOSIS — M109 Gout, unspecified: Secondary | ICD-10-CM | POA: Diagnosis not present

## 2015-09-27 DIAGNOSIS — Z955 Presence of coronary angioplasty implant and graft: Secondary | ICD-10-CM | POA: Diagnosis not present

## 2015-09-27 DIAGNOSIS — I5022 Chronic systolic (congestive) heart failure: Secondary | ICD-10-CM

## 2015-09-27 DIAGNOSIS — Z9981 Dependence on supplemental oxygen: Secondary | ICD-10-CM | POA: Diagnosis not present

## 2015-09-27 DIAGNOSIS — Z953 Presence of xenogenic heart valve: Secondary | ICD-10-CM

## 2015-09-27 DIAGNOSIS — Z8249 Family history of ischemic heart disease and other diseases of the circulatory system: Secondary | ICD-10-CM

## 2015-09-27 DIAGNOSIS — N179 Acute kidney failure, unspecified: Secondary | ICD-10-CM | POA: Diagnosis not present

## 2015-09-27 DIAGNOSIS — E876 Hypokalemia: Secondary | ICD-10-CM | POA: Diagnosis not present

## 2015-09-27 DIAGNOSIS — Z888 Allergy status to other drugs, medicaments and biological substances status: Secondary | ICD-10-CM

## 2015-09-27 DIAGNOSIS — I509 Heart failure, unspecified: Secondary | ICD-10-CM | POA: Diagnosis not present

## 2015-09-27 DIAGNOSIS — I5032 Chronic diastolic (congestive) heart failure: Secondary | ICD-10-CM | POA: Diagnosis not present

## 2015-09-27 DIAGNOSIS — I2510 Atherosclerotic heart disease of native coronary artery without angina pectoris: Secondary | ICD-10-CM | POA: Diagnosis present

## 2015-09-27 DIAGNOSIS — Z7982 Long term (current) use of aspirin: Secondary | ICD-10-CM | POA: Diagnosis not present

## 2015-09-27 DIAGNOSIS — I052 Rheumatic mitral stenosis with insufficiency: Secondary | ICD-10-CM | POA: Diagnosis present

## 2015-09-27 DIAGNOSIS — E785 Hyperlipidemia, unspecified: Secondary | ICD-10-CM | POA: Diagnosis present

## 2015-09-27 DIAGNOSIS — N184 Chronic kidney disease, stage 4 (severe): Secondary | ICD-10-CM | POA: Diagnosis present

## 2015-09-27 DIAGNOSIS — G4730 Sleep apnea, unspecified: Secondary | ICD-10-CM | POA: Diagnosis present

## 2015-09-27 DIAGNOSIS — H9190 Unspecified hearing loss, unspecified ear: Secondary | ICD-10-CM | POA: Diagnosis present

## 2015-09-27 DIAGNOSIS — R0602 Shortness of breath: Secondary | ICD-10-CM

## 2015-09-27 DIAGNOSIS — K5900 Constipation, unspecified: Secondary | ICD-10-CM | POA: Diagnosis not present

## 2015-09-27 DIAGNOSIS — T502X5A Adverse effect of carbonic-anhydrase inhibitors, benzothiadiazides and other diuretics, initial encounter: Secondary | ICD-10-CM | POA: Diagnosis not present

## 2015-09-27 DIAGNOSIS — E1122 Type 2 diabetes mellitus with diabetic chronic kidney disease: Secondary | ICD-10-CM | POA: Diagnosis present

## 2015-09-27 DIAGNOSIS — E875 Hyperkalemia: Secondary | ICD-10-CM | POA: Diagnosis not present

## 2015-09-27 DIAGNOSIS — Z79899 Other long term (current) drug therapy: Secondary | ICD-10-CM

## 2015-09-27 DIAGNOSIS — Z833 Family history of diabetes mellitus: Secondary | ICD-10-CM

## 2015-09-27 DIAGNOSIS — Z7189 Other specified counseling: Secondary | ICD-10-CM

## 2015-09-27 DIAGNOSIS — Z515 Encounter for palliative care: Secondary | ICD-10-CM | POA: Diagnosis not present

## 2015-09-27 DIAGNOSIS — Z91048 Other nonmedicinal substance allergy status: Secondary | ICD-10-CM

## 2015-09-27 DIAGNOSIS — K219 Gastro-esophageal reflux disease without esophagitis: Secondary | ICD-10-CM | POA: Diagnosis present

## 2015-09-27 DIAGNOSIS — I130 Hypertensive heart and chronic kidney disease with heart failure and stage 1 through stage 4 chronic kidney disease, or unspecified chronic kidney disease: Principal | ICD-10-CM | POA: Diagnosis present

## 2015-09-27 DIAGNOSIS — I272 Other secondary pulmonary hypertension: Secondary | ICD-10-CM | POA: Diagnosis not present

## 2015-09-27 DIAGNOSIS — Z66 Do not resuscitate: Secondary | ICD-10-CM | POA: Diagnosis not present

## 2015-09-27 DIAGNOSIS — I110 Hypertensive heart disease with heart failure: Secondary | ICD-10-CM | POA: Diagnosis not present

## 2015-09-27 LAB — COMPREHENSIVE METABOLIC PANEL
ALBUMIN: 3.3 g/dL — AB (ref 3.5–5.0)
ALK PHOS: 72 U/L (ref 38–126)
ALT: 12 U/L — ABNORMAL LOW (ref 17–63)
ANION GAP: 9 (ref 5–15)
AST: 23 U/L (ref 15–41)
BUN: 52 mg/dL — AB (ref 6–20)
CALCIUM: 9 mg/dL (ref 8.9–10.3)
CO2: 31 mmol/L (ref 22–32)
Chloride: 93 mmol/L — ABNORMAL LOW (ref 101–111)
Creatinine, Ser: 2.18 mg/dL — ABNORMAL HIGH (ref 0.61–1.24)
GFR calc Af Amer: 33 mL/min — ABNORMAL LOW (ref >60)
GFR calc non Af Amer: 28 mL/min — ABNORMAL LOW (ref >60)
GLUCOSE: 203 mg/dL — AB (ref 65–99)
POTASSIUM: 3.6 mmol/L (ref 3.5–5.1)
SODIUM: 133 mmol/L — AB (ref 135–145)
Total Bilirubin: 0.9 mg/dL (ref 0.3–1.2)
Total Protein: 6.8 g/dL (ref 6.5–8.1)

## 2015-09-27 LAB — CBC WITH DIFFERENTIAL/PLATELET
BASOS PCT: 0 %
Basophils Absolute: 0 10*3/uL (ref 0.0–0.1)
EOS ABS: 0.1 10*3/uL (ref 0.0–0.7)
EOS PCT: 1 %
HCT: 31.9 % — ABNORMAL LOW (ref 39.0–52.0)
HEMOGLOBIN: 8.9 g/dL — AB (ref 13.0–17.0)
LYMPHS PCT: 10 %
Lymphs Abs: 0.7 10*3/uL (ref 0.7–4.0)
MCH: 18.7 pg — ABNORMAL LOW (ref 26.0–34.0)
MCHC: 27.9 g/dL — ABNORMAL LOW (ref 30.0–36.0)
MCV: 66.9 fL — AB (ref 78.0–100.0)
MONOS PCT: 10 %
Monocytes Absolute: 0.7 10*3/uL (ref 0.1–1.0)
NEUTROS ABS: 5.8 10*3/uL (ref 1.7–7.7)
NEUTROS PCT: 79 %
PLATELETS: 204 10*3/uL (ref 150–400)
RBC: 4.77 MIL/uL (ref 4.22–5.81)
RDW: 21.3 % — ABNORMAL HIGH (ref 11.5–15.5)
WBC: 7.3 10*3/uL (ref 4.0–10.5)

## 2015-09-27 LAB — TSH: TSH: 2.659 u[IU]/mL (ref 0.350–4.500)

## 2015-09-27 LAB — GLUCOSE, CAPILLARY
GLUCOSE-CAPILLARY: 150 mg/dL — AB (ref 65–99)
Glucose-Capillary: 217 mg/dL — ABNORMAL HIGH (ref 65–99)
Glucose-Capillary: 172 mg/dL — ABNORMAL HIGH (ref 65–99)

## 2015-09-27 LAB — BRAIN NATRIURETIC PEPTIDE: B Natriuretic Peptide: 1558.1 pg/mL — ABNORMAL HIGH (ref 0.0–100.0)

## 2015-09-27 MED ORDER — FAMOTIDINE 20 MG PO TABS: 20.0000 mg | ORAL_TABLET | Freq: Two times a day (BID) | ORAL | Status: DC

## 2015-09-27 MED ORDER — POLYETHYLENE GLYCOL 3350 17 G PO PACK
17.0000 g | PACK | Freq: Every day | ORAL | Status: DC | PRN
  Administered 2015-10-01: 17 g via ORAL
  Filled 2015-09-27 (×2): qty 1

## 2015-09-27 MED ORDER — NITROGLYCERIN 0.4 MG SL SUBL: 0.4000 mg | SUBLINGUAL_TABLET | SUBLINGUAL | Status: DC | PRN

## 2015-09-27 MED ORDER — ASPIRIN EC 81 MG PO TBEC
81.0000 mg | DELAYED_RELEASE_TABLET | Freq: Every day | ORAL | Status: DC
  Administered 2015-09-28 – 2015-10-08 (×11): 81 mg via ORAL
  Filled 2015-09-27 (×12): qty 1

## 2015-09-27 MED ORDER — METHOCARBAMOL 500 MG PO TABS
500.0000 mg | ORAL_TABLET | Freq: Every day | ORAL | Status: DC | PRN
  Administered 2015-10-06: 500 mg via ORAL
  Filled 2015-09-27: qty 1

## 2015-09-27 MED ORDER — LINAGLIPTIN 5 MG PO TABS
5.0000 mg | ORAL_TABLET | Freq: Every day | ORAL | Status: DC
  Administered 2015-09-28 – 2015-10-12 (×15): 5 mg via ORAL
  Filled 2015-09-27 (×15): qty 1

## 2015-09-27 MED ORDER — SODIUM CHLORIDE 0.9% FLUSH: 3.0000 mL | INTRAVENOUS | Status: DC | PRN

## 2015-09-27 MED ORDER — POTASSIUM CHLORIDE CRYS ER 20 MEQ PO TBCR
60.0000 meq | EXTENDED_RELEASE_TABLET | Freq: Two times a day (BID) | ORAL | Status: DC
  Administered 2015-09-27: 60 meq via ORAL
  Filled 2015-09-27 (×2): qty 3

## 2015-09-27 MED ORDER — ENOXAPARIN SODIUM 40 MG/0.4ML ~~LOC~~ SOLN: 40.0000 mg | Freq: Every day | SUBCUTANEOUS | Status: DC

## 2015-09-27 MED ORDER — SODIUM CHLORIDE 0.9 % IV SOLN: 250.0000 mL | INTRAVENOUS | Status: DC | PRN

## 2015-09-27 MED ORDER — ZINC OXIDE 12.8 % EX OINT
TOPICAL_OINTMENT | CUTANEOUS | Status: DC | PRN
  Filled 2015-09-27: qty 56.7

## 2015-09-27 MED ORDER — TRAMADOL HCL 50 MG PO TABS
100.0000 mg | ORAL_TABLET | Freq: Two times a day (BID) | ORAL | Status: DC | PRN
  Administered 2015-09-29 – 2015-10-03 (×7): 100 mg via ORAL
  Filled 2015-09-27 (×7): qty 2

## 2015-09-27 MED ORDER — SODIUM CHLORIDE 0.9% FLUSH
3.0000 mL | Freq: Two times a day (BID) | INTRAVENOUS | Status: DC
  Administered 2015-09-27 – 2015-10-12 (×28): 3 mL via INTRAVENOUS

## 2015-09-27 MED ORDER — PANTOPRAZOLE SODIUM 40 MG PO TBEC
40.0000 mg | DELAYED_RELEASE_TABLET | Freq: Every morning | ORAL | Status: DC
Start: 2015-09-27 — End: 2015-10-12
  Administered 2015-09-28 – 2015-10-12 (×14): 40 mg via ORAL
  Filled 2015-09-27 (×15): qty 1

## 2015-09-27 MED ORDER — ENOXAPARIN SODIUM 40 MG/0.4ML ~~LOC~~ SOLN
40.0000 mg | Freq: Every day | SUBCUTANEOUS | Status: DC
  Administered 2015-09-27 – 2015-10-03 (×6): 40 mg via SUBCUTANEOUS
  Filled 2015-09-27 (×7): qty 0.4

## 2015-09-27 MED ORDER — FAMOTIDINE 20 MG PO TABS
20.0000 mg | ORAL_TABLET | Freq: Every day | ORAL | Status: DC
  Administered 2015-09-27 – 2015-10-12 (×15): 20 mg via ORAL
  Filled 2015-09-27 (×16): qty 1

## 2015-09-27 MED ORDER — METOLAZONE 2.5 MG PO TABS
2.5000 mg | ORAL_TABLET | ORAL | Status: DC
  Administered 2015-09-28 – 2015-09-30 (×2): 2.5 mg via ORAL
  Filled 2015-09-27 (×2): qty 1

## 2015-09-27 MED ORDER — CLOPIDOGREL BISULFATE 75 MG PO TABS
75.0000 mg | ORAL_TABLET | Freq: Every day | ORAL | Status: DC
  Administered 2015-09-27 – 2015-10-08 (×12): 75 mg via ORAL
  Filled 2015-09-27 (×12): qty 1

## 2015-09-27 MED ORDER — INSULIN ASPART 100 UNIT/ML ~~LOC~~ SOLN
0.0000 [IU] | Freq: Three times a day (TID) | SUBCUTANEOUS | Status: DC
  Administered 2015-09-27: 5 [IU] via SUBCUTANEOUS
  Administered 2015-09-28: 2 [IU] via SUBCUTANEOUS
  Administered 2015-09-28: 8 [IU] via SUBCUTANEOUS
  Administered 2015-09-29: 5 [IU] via SUBCUTANEOUS
  Administered 2015-09-29: 2 [IU] via SUBCUTANEOUS
  Administered 2015-09-29: 3 [IU] via SUBCUTANEOUS

## 2015-09-27 MED ORDER — SERTRALINE HCL 100 MG PO TABS
100.0000 mg | ORAL_TABLET | Freq: Every day | ORAL | Status: DC
  Administered 2015-09-28 – 2015-10-12 (×15): 100 mg via ORAL
  Filled 2015-09-27 (×15): qty 1

## 2015-09-27 MED ORDER — ONDANSETRON HCL 4 MG/2ML IJ SOLN
4.0000 mg | Freq: Four times a day (QID) | INTRAMUSCULAR | Status: DC | PRN
  Administered 2015-10-01: 4 mg via INTRAVENOUS
  Filled 2015-09-27: qty 2

## 2015-09-27 MED ORDER — FUROSEMIDE 10 MG/ML IJ SOLN
80.0000 mg | Freq: Two times a day (BID) | INTRAMUSCULAR | Status: DC
  Administered 2015-09-27 – 2015-10-06 (×18): 80 mg via INTRAVENOUS
  Filled 2015-09-27 (×19): qty 8

## 2015-09-27 MED ORDER — REPAGLINIDE 2 MG PO TABS
2.0000 mg | ORAL_TABLET | Freq: Three times a day (TID) | ORAL | Status: DC
  Administered 2015-09-27 – 2015-10-12 (×42): 2 mg via ORAL
  Filled 2015-09-27 (×46): qty 1

## 2015-09-27 MED ORDER — ACETAMINOPHEN 325 MG PO TABS
650.0000 mg | ORAL_TABLET | ORAL | Status: DC | PRN
  Administered 2015-09-28 – 2015-10-02 (×2): 650 mg via ORAL
  Administered 2015-10-07: 325 mg via ORAL
  Administered 2015-10-10 – 2015-10-12 (×2): 650 mg via ORAL
  Filled 2015-09-27 (×6): qty 2

## 2015-09-27 MED ORDER — ALPRAZOLAM 0.25 MG PO TABS: 0.2500 mg | ORAL_TABLET | Freq: Two times a day (BID) | ORAL | Status: DC | PRN

## 2015-09-27 MED ORDER — INSULIN ASPART 100 UNIT/ML ~~LOC~~ SOLN
10.0000 [IU] | Freq: Three times a day (TID) | SUBCUTANEOUS | Status: DC
  Administered 2015-09-27 – 2015-09-30 (×7): 10 [IU] via SUBCUTANEOUS

## 2015-09-27 MED ORDER — ROSUVASTATIN CALCIUM 20 MG PO TABS
20.0000 mg | ORAL_TABLET | Freq: Every day | ORAL | Status: DC
  Administered 2015-09-27 – 2015-10-11 (×15): 20 mg via ORAL
  Filled 2015-09-27 (×16): qty 1

## 2015-09-27 NOTE — Progress Notes (Signed)
Direct admission from Dr. [name] office. Pt brought to the floor in stable condition. Vitals taken. Initial Assessment done. All immediate pertinent needs to patient addressed. Patient Guide given to patient. Important safety instructions relating to hospitalization reviewed with patient. Patient verbalized understanding. Will continue to monitor pt.  Maurene Capes RN

## 2015-09-27 NOTE — H&P (Signed)
History and Physical  Patient ID: [name] [name]. MRN: [id number], SOB: 1940-03-06 75 y.o. Date of Encounter: 09/27/2015, 12:03 PM  Primary Physician: [name], MD Primary Cardiologist: [name]  Chief Complaint: Shortness of breath  HPI: 75 y.o. male very complex past medical history presents to the office for evaluation of progressive edema and shortness of breath.  The patient has extensive coronary artery disease, valvular heart disease, and chronic congestive heart failure. He initially underwent 6 vessel CABG in 1998. He has undergone multiple PCI procedures in the past. In 2014 he underwent rotational atherectomy and bare-metal stenting of the right coronary artery. He developed progressive valvular disease with severe aortic and mitral stenosis. 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 His mitral stenosis has been managed medically. Other comorbidities include long-standing type 2 diabetes, chronic kidney disease, and chronic diastolic heart failure. The patient is on home oxygen. He has been hospitalized with near syncope and chest pain with conservative management of those problems during past hospitalizations.  The patient is here today with his wife. He has been seen recently by Dr. Damita Dunnings. He's been volume overloaded and diuretics of been adjusted. There have been some limitations related to his chronic kidney disease. The patient complains of progressive leg and abdominal swelling over the last 3-4 weeks. He normally wears a size 40 pants and has had to increase to 44. He is urinating frequently with oral diuretics. He has marked weakness and shortness of breath. He is short of breath with minimal activity and can't even walk from one room to the next without resting. He denies orthopnea or PND. He's been going to the wound care center for his chronic stasis ulcerations of the legs.      Past Medical History:    Diagnosis Date  . Anxiety   . Aortic stenosis    a. 04/2012 s/p baloon valvuloplasty w/ reduction in gradient from 47 to 27.  . Arthritis   . CAD (coronary artery disease) 11/05/1996   a. CABG x6 by Dr Redmond Pulling - LIMA to Diag+LAD, SVG to OM1+OM2, SVG to PDA+RPL, b. VG->RCA known to be occluded;  c. 05/2012 PCI/Rota/BMS to m/dRCA w/ 3.5x15 Vision BMS x 2 post-dil to 3.75.  Marland Kitchen Carotid artery disease ([place])   . Cellulitis and abscess of leg, except foot   . Chills   . Chronic diastolic congestive heart failure ([place])    a. 04/2012 Echo: EF 55-60-%  . Cough   . Cramps, muscle, general   . Diabetes mellitus   . Fracture of humerus, proximal, right, closed 2016  . Generalized headaches   . GERD (gastroesophageal reflux disease)   . Hearing loss   . Hiatal hernia   . Hyperlipidemia   . Hypertension   . Leg edema   . Low back pain   . Mitral stenosis    a. moderate by echo 04/2012.  Marland Kitchen Nasal congestion   . On home oxygen therapy    "2L 4PM til 8:30AM qd" (02/10/2015)  . Pulmonary hypertension ([place])    a. 05/2012 RH cath: PA 97/32(54)  . Right knee sprain   . 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 Shortness of breath   . Sleep apnea    no cpap  sleep study >5 yrs  . Venous insufficiency (chronic) (peripheral)   . Wheezing  Surgical History:       Past Surgical History:  Procedure Laterality Date  . Acute or Chronic Diastolic HF  123456 - [id number]   RLE cellulitis - HOSP  . APPENDECTOMY  12/25/2010   Dr Redmond Pulling  . CARDIAC CATHETERIZATION    . CORONARY ARTERY BYPASS GRAFT    . DOPPLER ECHOCARDIOGRAPHY  10/11/2008   Mild AS, Mild-Mod MS, Severe LVH  . ETT Cardiolite  12/31/2006   Mild-Mod distal inf/apical ischemia  . HERNIA REPAIR  [id number]   umbilical hernia repair   . INTRAOPERATIVE TRANSESOPHAGEAL ECHOCARDIOGRAM  N/A 06/16/2013   Procedure: INTRAOPERATIVE TRANSESOPHAGEAL ECHOCARDIOGRAM;  Surgeon: [name], MD;  Location: [facility] OR;  Service: Open Heart Surgery;  Laterality: N/A;  . LE Arterial and venous US  10/11/2008   Art clear.  Venous Neg DVT  . LEFT AND RIGHT HEART CATHETERIZATION WITH CORONARY ANGIOGRAM N/A 03/20/2013   Procedure: LEFT AND RIGHT HEART CATHETERIZATION WITH CORONARY ANGIOGRAM;  Surgeon: Blane Ohara, MD;  Location: [facility] CATH LAB;  Service: Cardiovascular;  Laterality: N/A;  . Open Heart Surgery    . PERCUTANEOUS CORONARY ROTOBLATOR INTERVENTION (PCI-R) N/A 05/07/2012   Procedure: PERCUTANEOUS CORONARY ROTOBLATOR INTERVENTION (PCI-R);  Surgeon: [name], MD;  Location: [facility] CATH LAB;  Service: Cardiovascular;  Laterality: N/A;  . TEE WITHOUT CARDIOVERSION  03/20/2012   Procedure: TRANSESOPHAGEAL ECHOCARDIOGRAM (TEE);  Surgeon: [name], MD;  Location: [place];  Service: Cardiovascular;  Laterality: N/A;  . TRANSCATHETER AORTIC VALVE REPLACEMENT, TRANSFEMORAL N/A 06/16/2013   Procedure: TRANSCATHETER AORTIC VALVE REPLACEMENT, TRANSFEMORAL;  Surgeon: [name], MD;  Location: [place];  Service: Open Heart Surgery;  Laterality: N/A;  . UMBILICAL HERNIA REPAIR  12/25/2010   Dr Redmond Pulling     Home Meds:        Prior to Admission medications   Medication Sig Start Date End Date Taking? Authorizing Provider  ALPRAZolam (XANAX) 0.25 MG tablet Take 1 tablet (0.25 mg total) by mouth 2 (two) times daily as needed for anxiety. 07/17/13  Yes [name], MD  aspirin EC 81 MG tablet Take 81 mg by mouth daily.   Yes Historical Provider, MD  clopidogrel (PLAVIX) 75 MG tablet Take 75 mg by mouth daily with breakfast.   Yes Historical Provider, MD  colchicine 0.6 MG tablet Take 1 tablet (0.6 mg total) by mouth daily as needed (for gout). 07/30/14  Yes Tonia Ghent, MD  fish oil-omega-3 fatty acids 1000 MG capsule Take 1 g by mouth 2 (two) times daily.    Yes  Historical Provider, MD  furosemide (LASIX) 80 MG tablet TAKE 2 TABLETS BY MOUTH IN THE MORNING AND 1 TABLET IN THE EVENING 03/28/15  Yes [name], MD  HUMALOG 100 UNIT/ML injection Inject 10 Units into the skin 3 (three) times daily. Baseline stays at 10 units. Adjust dose at needed based on sliding scale and bg readings 05/27/15  Yes Historical Provider, MD  JANUVIA 100 MG tablet Take 100 mg by mouth at bedtime.  04/24/12  Yes Historical Provider, MD  methocarbamol (ROBAXIN) 500 MG tablet Take 1 tablet (500 mg total) by mouth daily as needed (leg cramps). 08/08/15  Yes Tonia Ghent, MD  metolazone (ZAROXOLYN) 2.5 MG tablet TAKE 1 TABLET BY MOUTH EVERY OTHER DAY 09/08/15  Yes [name], MD  NITROSTAT 0.4 MG SL tablet DISSOLVE 1 TABLET UNDER THE TONGUE FOR CHEST PAIN. MAY REPEAT EVERY 5MINUTES UP TO 3 DOSES. IF NO RELIEF, CALL 911** 01/31/15  Yes [name],  MD  pantoprazole (PROTONIX) 40 MG tablet Take 1 tablet (40 mg total) by mouth every morning. 02/15/15  Yes [name], MD  polyethylene glycol powder (GLYCOLAX/MIRALAX) powder Take 17 g by mouth daily as needed (for constipation). 06/16/14  Yes Tonia Ghent, MD  potassium chloride SA (K-DUR,KLOR-CON) 20 MEQ tablet Take 3 tablets (60 mEq) by mouth with breakfast and dinner. Take 3 tablets (60 mEq) by mouth at bedtime. FOR A TOTAL OF 9 TABLETS DAILY.   Yes Historical Provider, MD  Powders (ANTI MONKEY BUTT) POWD Apply 1 application topically as needed (burning itching drying up). Apply to legs and rub in   Yes Historical Provider, MD  ranitidine (ZANTAC) 150 MG tablet Take 150 mg by mouth 2 (two) times daily.  04/11/12  Yes Historical Provider, MD  repaglinide (PRANDIN) 2 MG tablet Take 2 mg by mouth 3 (three) times daily before meals.  03/09/14  Yes Historical Provider, MD  sertraline (ZOLOFT) 100 MG tablet Take 1 tablet (100 mg total) by mouth daily. 07/04/15  Yes Tonia Ghent, MD  TOUJEO SOLOSTAR 300 UNIT/ML SOPN  Inject 40 Units into the skin 3 (three) times daily with meals.  02/04/15  Yes Historical Provider, MD  traMADol (ULTRAM) 50 MG tablet TAKE 1-2 TABLETS BY MOUTH EVERY 12 HOURS AS NEEDED FOR SEVERE PAIN (MAX 4 PILLS PER DAY) 05/19/15  Yes Tonia Ghent, MD  Vitamin D, Ergocalciferol, (DRISDOL) 50000 UNITS CAPS Take 50,000 Units by mouth every 30 (thirty) days. Take every month on saturday   Yes Historical Provider, MD  rosuvastatin (CRESTOR) 20 MG tablet Take 1 tablet (20 mg total) by mouth at bedtime. 01/24/12 09/12/15  Renella Cunas, MD    Allergies:       Allergies  Allergen Reactions  . Adhesive [Tape] Rash    blistered  . Celecoxib Rash    Social History        Social History  . Marital status: Married    Spouse name: N/A  . Number of children: N/A  . Years of education: N/A      Occupational History  . Not on file.   Social History Main Topics  . Smoking status: Never Smoker  . Smokeless tobacco: Never Used  . Alcohol use No  . Drug use: No  . Sexual activity: Not on file       Other Topics Concern  . Not on file      Social History Narrative  . No narrative on file           Family History  Problem Relation Age of Onset  . Diabetes Father   . Emphysema Mother   . Heart disease Father   . Diabetes Son   . Other Son     HEALTHY    Review of Systems: General: negative for chills, fever, night sweats. Positive for weight gain. ENT: negative for rhinorrhea or epistaxis Cardiovascular: see HPI. Negative for chest pain. Dermatological: negative for rash Respiratory: negative for cough or wheezing, positive for shortness of breath GI: negative for nausea, vomiting, diarrhea, bright red blood per rectum, melena, or hematemesis. Positive for constipation GU: no hematuria, urgency. Positive for urinary frequency Neurologic: negative for visual changes, syncope, headache, or dizziness Heme: Positive for easy bruising Endo: negative  for excessive thirst, thyroid disorder, or flushing Musculoskeletal: Positive for joint pains, positive for myalgias All other systems reviewed and are otherwise negative except as noted above.  Physical Exam: Blood pressure (!) 150/62, pulse  70, height 5\' 10"  (1.778 m), weight 231 lb 12.8 oz (105.1 kg). General: Chronically ill-appearing male, overweight, in a wheelchair on O2, alert and oriented, in no acute distress. HEENT: Normocephalic, atraumatic, sclera anicteric Neck: Supple. Carotids 2+ without bruits. JVP elevated to the angle of the jaw Lungs: Bibasilar rales are present Heart: RRR with normal S1 and S2. Grade 2/6 systolic murmur at the left lower sternal border Abdomen: Soft, non-tender, distended without rebound or guarding Back: No CVA tenderness Msk:  Strength and tone appear normal for age. Extremities: Both legs are wrapped to the knee. There is 2+ edema above the level of the wraps Neuro: CNII-XII intact, moves all extremities spontaneously. Psych:  Responds to questions appropriately with a normal affect. Skin: warm and dry    Labs:   Recent Labs       Lab Results  Component Value Date   WBC 8.4 09/15/2015   HGB 9.0 (L) 09/15/2015   HCT 30.0 (L) 09/15/2015   MCV 63.1 Repeated and verified X2. (L) 09/15/2015   PLT 178.0 09/15/2015     No results for input(s): NA, K, CL, CO2, BUN, CREATININE, CALCIUM, PROT, BILITOT, ALKPHOS, ALT, AST, GLUCOSE in the last 168 hours.  Invalid input(s): LABALBU Recent Labs (last 2 labs)   No results for input(s): CKTOTAL, CKMB, TROPONINI in the last 72 hours.   Recent Labs  No results found for: CHOL, HDL, LDLCALC, TRIG   Recent Labs  No results found for: DDIMER    Radiology/Studies:  Imaging Results  No results found.     CARDIAC STUDIES: 2-D echocardiogram 05/30/2015: Study Conclusions  - Left ventricle: The cavity size was normal. Wall thickness was increased in a pattern of severe LVH. Systolic  function was normal. The estimated ejection fraction was in the range of 55% to 60%. Wall motion was normal; there were no regional wall motion abnormalities. Doppler parameters are consistent with high ventricular filling pressure. - Ventricular septum: The contour showed diastolic flattening and systolic flattening. - Aortic valve: A bioprosthesis was present. There was trivial regurgitation. Valve area (VTI): 2.55 cm^2. Valve area (Vmax): 2.36 cm^2. Valve area (Vmean): 2.52 cm^2. - Mitral valve: Severely calcified annulus. The findings are consistent with severe stenosis. Valve area by pressure half-time: 0.98 cm^2. - Left atrium: The atrium was severely dilated. - Right ventricle: The cavity size was mildly dilated. Systolic function was moderately reduced. - Right atrium: The atrium was moderately dilated. - Pulmonary arteries: PA peak pressure: 37 mm Hg (S).  Impressions:  - Normal LV systolic function; severe LVH; D shaped septum suggestive of pulmonary hypertension; elevated LV filling pressure; biatrial enlargement; mild RVE with moderately reduced RV function; s/p AVR with mean gradient 6 mmHg and trace AI; severe MAC with severe MS and trace MR.  ASSESSMENT AND PLAN:  1. Acute on chronic diastolic heart failure, NYHA functional class IV symptoms: The patient has marked volume overload on exam. 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 His situation is complicated by chronic kidney disease. I recommended hospital admission for IV diuresis. Will likely treat him with a combination of metolazone and intravenous Lasix with close lab follow-up of his renal function. The patient has a good understanding of his poor prognosis with right heart failure, severe pulmonary hypertension, and untreatable valvular disease.  2. Severe mitral  stenosis: Very heavily calcified mitral valve and mitral annulus on past imaging studies. timited  to medical therapy.  3. Coronary artery disease: The patient has advanced ischemic disease with diffuse small vessel coronary disease beyond his bypass graft insertion sites.  4. Type 2 diabetes: We'll cover him with insulin while hospitalized  5. Chronic kidney disease stage 3/4: GFR is approximately 30. Will require close follow-up during treatment of his heart failure with IV diuretics.  Deatra James MD 09/27/2015, 12:03 PM

## 2015-09-27 NOTE — Patient Instructions (Signed)
Medication Instructions:  No changes   Labwork: None   Testing/Procedures: None   Follow-Up: You are being admitted to [facility] today. Go to the Main Entrance A[facility] of [facility] to the [facility].       If you need a refill on your cardiac medications before your next appointment, please call your pharmacy.

## 2015-09-27 NOTE — Consult Note (Addendum)
[place] Nurse wound consult note Reason for Consult: Consult requested for bilat Una boots.  Pt is well-informed regarding topical treatment and states he wears Una boots which are changed twice a week for chronic full thickness wounds.  He is followed by the outpatient would clinic and was due to have dressings changed during an appointment tomorrow, on Wed.  WOC will return tomorrow to remove compression wraps, assess wounds, and change dressings. Thank-you,  Julien Girt MSN, [place], [facility], [place], [place]

## 2015-09-27 NOTE — Progress Notes (Signed)
History and Physical  Patient ID: [name] [name]. MRN: [id number], SOB: 23-Feb-1941 75 y.o. Date of Encounter: 09/27/2015, 12:03 PM  Primary Physician: [name], MD Primary Cardiologist: [name]  Chief Complaint: Shortness of breath  HPI: 75 y.o. male very complex past medical history presents to the office for evaluation of progressive edema and shortness of breath.  The patient has extensive coronary artery disease, valvular heart disease, and chronic congestive heart failure. He initially underwent 6 vessel CABG in 1998. He has undergone multiple PCI procedures in the past. In 2014 he underwent rotational atherectomy and bare-metal stenting of the right coronary artery. He developed progressive valvular disease with severe aortic and mitral stenosis. 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 His mitral stenosis has been managed medically. Other comorbidities include long-standing type 2 diabetes, chronic kidney disease, and chronic diastolic heart failure. The patient is on home oxygen. He has been hospitalized with near syncope and chest pain with conservative management of those problems during past hospitalizations.  The patient is here today with his wife. He has been seen recently by Dr. Damita Dunnings. He's been volume overloaded and diuretics of been adjusted. There have been some limitations related to his chronic kidney disease. The patient complains of progressive leg and abdominal swelling over the last 3-4 weeks. He normally wears a size 40 pants and has had to increase to 44. He is urinating frequently with oral diuretics. He has marked weakness and shortness of breath. He is short of breath with minimal activity and can't even walk from one room to the next without resting. He denies orthopnea or PND. He's been going to the wound care center for his chronic stasis ulcerations of the legs.  Past Medical History:  Diagnosis Date   . Anxiety   . Aortic stenosis    a. 04/2012 s/p baloon valvuloplasty w/ reduction in gradient from 47 to 27.  . Arthritis   . CAD (coronary artery disease) 11/05/1996   a. CABG x6 by Dr Redmond Pulling - LIMA to Diag+LAD, SVG to OM1+OM2, SVG to PDA+RPL, b. VG->RCA known to be occluded;  c. 05/2012 PCI/Rota/BMS to m/dRCA w/ 3.5x15 Vision BMS x 2 post-dil to 3.75.  Marland Kitchen Carotid artery disease ([place])   . Cellulitis and abscess of leg, except foot   . Chills   . Chronic diastolic congestive heart failure ([place])    a. 04/2012 Echo: EF 55-60-%  . Cough   . Cramps, muscle, general   . Diabetes mellitus   . Fracture of humerus, proximal, right, closed 2016  . Generalized headaches   . GERD (gastroesophageal reflux disease)   . Hearing loss   . Hiatal hernia   . Hyperlipidemia   . Hypertension   . Leg edema   . Low back pain   . Mitral stenosis    a. moderate by echo 04/2012.  Marland Kitchen Nasal congestion   . On home oxygen therapy    "2L 4PM til 8:30AM qd" (02/10/2015)  . Pulmonary hypertension ([place])    a. 05/2012 RH cath: PA 97/32(54)  . Right knee sprain   . 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 Shortness of breath   . Sleep apnea    no cpap  sleep study >5 yrs  . Venous insufficiency (chronic) (peripheral)   . Wheezing      Surgical  History:  Past Surgical History:  Procedure Laterality Date  . Acute or Chronic Diastolic HF  123456 - [id number]   RLE cellulitis - HOSP  . APPENDECTOMY  12/25/2010   Dr Redmond Pulling  . CARDIAC CATHETERIZATION    . CORONARY ARTERY BYPASS GRAFT    . DOPPLER ECHOCARDIOGRAPHY  10/11/2008   Mild AS, Mild-Mod MS, Severe LVH  . ETT Cardiolite  12/31/2006   Mild-Mod distal inf/apical ischemia  . HERNIA REPAIR  [id number]   umbilical hernia repair   . INTRAOPERATIVE TRANSESOPHAGEAL ECHOCARDIOGRAM N/A 06/16/2013   Procedure: INTRAOPERATIVE TRANSESOPHAGEAL ECHOCARDIOGRAM;   Surgeon: [name], MD;  Location: [facility] OR;  Service: Open Heart Surgery;  Laterality: N/A;  . LE Arterial and venous US  10/11/2008   Art clear.  Venous Neg DVT  . LEFT AND RIGHT HEART CATHETERIZATION WITH CORONARY ANGIOGRAM N/A 03/20/2013   Procedure: LEFT AND RIGHT HEART CATHETERIZATION WITH CORONARY ANGIOGRAM;  Surgeon: Blane Ohara, MD;  Location: [facility] CATH LAB;  Service: Cardiovascular;  Laterality: N/A;  . Open Heart Surgery    . PERCUTANEOUS CORONARY ROTOBLATOR INTERVENTION (PCI-R) N/A 05/07/2012   Procedure: PERCUTANEOUS CORONARY ROTOBLATOR INTERVENTION (PCI-R);  Surgeon: [name], MD;  Location: [facility] CATH LAB;  Service: Cardiovascular;  Laterality: N/A;  . TEE WITHOUT CARDIOVERSION  03/20/2012   Procedure: TRANSESOPHAGEAL ECHOCARDIOGRAM (TEE);  Surgeon: [name], MD;  Location: [place];  Service: Cardiovascular;  Laterality: N/A;  . TRANSCATHETER AORTIC VALVE REPLACEMENT, TRANSFEMORAL N/A 06/16/2013   Procedure: TRANSCATHETER AORTIC VALVE REPLACEMENT, TRANSFEMORAL;  Surgeon: [name], MD;  Location: [place];  Service: Open Heart Surgery;  Laterality: N/A;  . UMBILICAL HERNIA REPAIR  12/25/2010   Dr Redmond Pulling     Home Meds: Prior to Admission medications   Medication Sig Start Date End Date Taking? Authorizing Provider  ALPRAZolam (XANAX) 0.25 MG tablet Take 1 tablet (0.25 mg total) by mouth 2 (two) times daily as needed for anxiety. 07/17/13  Yes [name], MD  aspirin EC 81 MG tablet Take 81 mg by mouth daily.   Yes Historical Provider, MD  clopidogrel (PLAVIX) 75 MG tablet Take 75 mg by mouth daily with breakfast.   Yes Historical Provider, MD  colchicine 0.6 MG tablet Take 1 tablet (0.6 mg total) by mouth daily as needed (for gout). 07/30/14  Yes Tonia Ghent, MD  fish oil-omega-3 fatty acids 1000 MG capsule Take 1 g by mouth 2 (two) times daily.    Yes Historical Provider, MD  furosemide (LASIX) 80 MG tablet TAKE 2 TABLETS BY MOUTH IN THE MORNING AND 1  TABLET IN THE EVENING 03/28/15  Yes [name], MD  HUMALOG 100 UNIT/ML injection Inject 10 Units into the skin 3 (three) times daily. Baseline stays at 10 units. Adjust dose at needed based on sliding scale and bg readings 05/27/15  Yes Historical Provider, MD  JANUVIA 100 MG tablet Take 100 mg by mouth at bedtime.  04/24/12  Yes Historical Provider, MD  methocarbamol (ROBAXIN) 500 MG tablet Take 1 tablet (500 mg total) by mouth daily as needed (leg cramps). 08/08/15  Yes Tonia Ghent, MD  metolazone (ZAROXOLYN) 2.5 MG tablet TAKE 1 TABLET BY MOUTH EVERY OTHER DAY 09/08/15  Yes [name], MD  NITROSTAT 0.4 MG SL tablet DISSOLVE 1 TABLET UNDER THE TONGUE FOR CHEST PAIN. MAY REPEAT EVERY 5MINUTES UP TO 3 DOSES. IF NO RELIEF, CALL 911** 01/31/15  Yes [name], MD  pantoprazole (PROTONIX) 40 MG tablet Take 1 tablet (40 mg total)  by mouth every morning. 02/15/15  Yes [name], MD  polyethylene glycol powder (GLYCOLAX/MIRALAX) powder Take 17 g by mouth daily as needed (for constipation). 06/16/14  Yes Tonia Ghent, MD  potassium chloride SA (K-DUR,KLOR-CON) 20 MEQ tablet Take 3 tablets (60 mEq) by mouth with breakfast and dinner. Take 3 tablets (60 mEq) by mouth at bedtime. FOR A TOTAL OF 9 TABLETS DAILY.   Yes Historical Provider, MD  Powders (ANTI MONKEY BUTT) POWD Apply 1 application topically as needed (burning itching drying up). Apply to legs and rub in   Yes Historical Provider, MD  ranitidine (ZANTAC) 150 MG tablet Take 150 mg by mouth 2 (two) times daily.  04/11/12  Yes Historical Provider, MD  repaglinide (PRANDIN) 2 MG tablet Take 2 mg by mouth 3 (three) times daily before meals.  03/09/14  Yes Historical Provider, MD  sertraline (ZOLOFT) 100 MG tablet Take 1 tablet (100 mg total) by mouth daily. 07/04/15  Yes Tonia Ghent, MD  TOUJEO SOLOSTAR 300 UNIT/ML SOPN Inject 40 Units into the skin 3 (three) times daily with meals.  02/04/15  Yes Historical Provider, MD  traMADol (ULTRAM)  50 MG tablet TAKE 1-2 TABLETS BY MOUTH EVERY 12 HOURS AS NEEDED FOR SEVERE PAIN (MAX 4 PILLS PER DAY) 05/19/15  Yes Tonia Ghent, MD  Vitamin D, Ergocalciferol, (DRISDOL) 50000 UNITS CAPS Take 50,000 Units by mouth every 30 (thirty) days. Take every month on saturday   Yes Historical Provider, MD  rosuvastatin (CRESTOR) 20 MG tablet Take 1 tablet (20 mg total) by mouth at bedtime. 01/24/12 09/12/15  Renella Cunas, MD    Allergies:  Allergies  Allergen Reactions  . Adhesive [Tape] Rash    blistered  . Celecoxib Rash    Social History   Social History  . Marital status: Married    Spouse name: N/A  . Number of children: N/A  . Years of education: N/A   Occupational History  . Not on file.   Social History Main Topics  . Smoking status: Never Smoker  . Smokeless tobacco: Never Used  . Alcohol use No  . Drug use: No  . Sexual activity: Not on file   Other Topics Concern  . Not on file   Social History Narrative  . No narrative on file     Family History  Problem Relation Age of Onset  . Diabetes Father   . Emphysema Mother   . Heart disease Father   . Diabetes Son   . Other Son     HEALTHY    Review of Systems: General: negative for chills, fever, night sweats. Positive for weight gain. ENT: negative for rhinorrhea or epistaxis Cardiovascular: see HPI. Negative for chest pain. Dermatological: negative for rash Respiratory: negative for cough or wheezing, positive for shortness of breath GI: negative for nausea, vomiting, diarrhea, bright red blood per rectum, melena, or hematemesis. Positive for constipation GU: no hematuria, urgency. Positive for urinary frequency Neurologic: negative for visual changes, syncope, headache, or dizziness Heme: Positive for easy bruising Endo: negative for excessive thirst, thyroid disorder, or flushing Musculoskeletal: Positive for joint pains, positive for myalgias All other systems reviewed and are otherwise negative except  as noted above.  Physical Exam: Blood pressure (!) 150/62, pulse 70, height 5\' 10"  (1.778 m), weight 231 lb 12.8 oz (105.1 kg). General: Chronically ill-appearing male, overweight, in a wheelchair on O2, alert and oriented, in no acute distress. HEENT: Normocephalic, atraumatic, sclera anicteric Neck: Supple. Carotids 2+  without bruits. JVP elevated to the angle of the jaw Lungs: Bibasilar rales are present Heart: RRR with normal S1 and S2. Grade 2/6 systolic murmur at the left lower sternal border Abdomen: Soft, non-tender, distended without rebound or guarding Back: No CVA tenderness Msk:  Strength and tone appear normal for age. Extremities: Both legs are wrapped to the knee. There is 2+ edema above the level of the wraps Neuro: CNII-XII intact, moves all extremities spontaneously. Psych:  Responds to questions appropriately with a normal affect. Skin: warm and dry    Labs:   Lab Results  Component Value Date   WBC 8.4 09/15/2015   HGB 9.0 (L) 09/15/2015   HCT 30.0 (L) 09/15/2015   MCV 63.1 Repeated and verified X2. (L) 09/15/2015   PLT 178.0 09/15/2015   No results for input(s): NA, K, CL, CO2, BUN, CREATININE, CALCIUM, PROT, BILITOT, ALKPHOS, ALT, AST, GLUCOSE in the last 168 hours.  Invalid input(s): LABALBU No results for input(s): CKTOTAL, CKMB, TROPONINI in the last 72 hours. No results found for: CHOL, HDL, LDLCALC, TRIG No results found for: DDIMER  Radiology/Studies:  No results found.   CARDIAC STUDIES: 2-D echocardiogram 05/30/2015: Study Conclusions  - Left ventricle: The cavity size was normal. Wall thickness was   increased in a pattern of severe LVH. Systolic function was   normal. The estimated ejection fraction was in the range of 55%   to 60%. Wall motion was normal; there were no regional wall   motion abnormalities. Doppler parameters are consistent with high   ventricular filling pressure. - Ventricular septum: The contour showed diastolic  flattening and   systolic flattening. - Aortic valve: A bioprosthesis was present. There was trivial   regurgitation. Valve area (VTI): 2.55 cm^2. Valve area (Vmax):   2.36 cm^2. Valve area (Vmean): 2.52 cm^2. - Mitral valve: Severely calcified annulus. The findings are   consistent with severe stenosis. Valve area by pressure   half-time: 0.98 cm^2. - Left atrium: The atrium was severely dilated. - Right ventricle: The cavity size was mildly dilated. Systolic   function was moderately reduced. - Right atrium: The atrium was moderately dilated. - Pulmonary arteries: PA peak pressure: 37 mm Hg (S).  Impressions:  - Normal LV systolic function; severe LVH; D shaped septum   suggestive of pulmonary hypertension; elevated LV filling   pressure; biatrial enlargement; mild RVE with moderately reduced   RV function; s/p AVR with mean gradient 6 mmHg and trace AI;   severe MAC with severe MS and trace MR.  ASSESSMENT AND PLAN:  1. Acute on chronic diastolic heart failure, NYHA functional class IV symptoms: The patient has marked volume overload on exam. 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 His situation is complicated by chronic kidney disease. I recommended hospital admission for IV diuresis. Will likely treat him with a combination of metolazone and intravenous Lasix with close lab follow-up of his renal function. The patient has a good understanding of his poor prognosis with right heart failure, severe pulmonary hypertension, and untreatable valvular disease.  2. Severe mitral stenosis: Very heavily calcified mitral valve and mitral annulus on past imaging studies. timited to medical therapy.  3. Coronary artery disease: The patient has advanced ischemic disease with diffuse small vessel coronary disease beyond his bypass graft insertion sites.  4. Type 2 diabetes:  We'll cover him with insulin while hospitalized  5. Chronic kidney disease  stage 3/4: GFR is approximately 30. Will require close follow-up during treatment of his heart failure with IV diuretics.  Deatra James MD 09/27/2015, 12:03 PM

## 2015-09-28 ENCOUNTER — Inpatient Hospital Stay (HOSPITAL_COMMUNITY): Payer: Medicare Other

## 2015-09-28 ENCOUNTER — Encounter (HOSPITAL_COMMUNITY): Payer: Self-pay | Admitting: *Deleted

## 2015-09-28 LAB — GLUCOSE, CAPILLARY
GLUCOSE-CAPILLARY: 107 mg/dL — AB (ref 65–99)
GLUCOSE-CAPILLARY: 257 mg/dL — AB (ref 65–99)
GLUCOSE-CAPILLARY: 119 mg/dL — AB (ref 65–99)
GLUCOSE-CAPILLARY: 154 mg/dL — AB (ref 65–99)
Glucose-Capillary: 46 mg/dL — ABNORMAL LOW (ref 65–99)
Glucose-Capillary: 128 mg/dL — ABNORMAL HIGH (ref 65–99)

## 2015-09-28 LAB — BASIC METABOLIC PANEL
ANION GAP: 10 (ref 5–15)
BUN: 50 mg/dL — AB (ref 6–20)
CHLORIDE: 94 mmol/L — AB (ref 101–111)
CO2: 32 mmol/L (ref 22–32)
Calcium: 9 mg/dL (ref 8.9–10.3)
Creatinine, Ser: 2.02 mg/dL — ABNORMAL HIGH (ref 0.61–1.24)
GFR, EST AFRICAN AMERICAN: 36 mL/min — AB (ref >60)
GFR, EST NON AFRICAN AMERICAN: 31 mL/min — AB (ref >60)
Glucose, Bld: 142 mg/dL — ABNORMAL HIGH (ref 65–99)
POTASSIUM: 2.9 mmol/L — AB (ref 3.5–5.1)
SODIUM: 136 mmol/L (ref 135–145)

## 2015-09-28 MED ORDER — POTASSIUM CHLORIDE CRYS ER 20 MEQ PO TBCR
60.0000 meq | EXTENDED_RELEASE_TABLET | Freq: Three times a day (TID) | ORAL | Status: DC
  Administered 2015-09-28 – 2015-09-30 (×7): 60 meq via ORAL
  Filled 2015-09-28 (×7): qty 3

## 2015-09-28 MED ORDER — GLUCERNA SHAKE PO LIQD
237.0000 mL | Freq: Two times a day (BID) | ORAL | Status: DC
  Administered 2015-09-30 – 2015-10-09 (×10): 237 mL via ORAL

## 2015-09-28 MED ORDER — ALPRAZOLAM 0.25 MG PO TABS
0.2500 mg | ORAL_TABLET | Freq: Every day | ORAL | Status: DC
  Administered 2015-09-28 – 2015-10-12 (×15): 0.25 mg via ORAL
  Filled 2015-09-28 (×15): qty 1

## 2015-09-28 MED ORDER — COLCHICINE 0.6 MG PO TABS
0.6000 mg | ORAL_TABLET | Freq: Every day | ORAL | Status: DC
  Administered 2015-09-28 – 2015-10-06 (×9): 0.6 mg via ORAL
  Filled 2015-09-28 (×9): qty 1

## 2015-09-28 NOTE — Care Management Important Message (Signed)
Important Message  Patient Details  Name: [name] [name]. MRN: [id number] Date of Birth: [birth year]   Medicare Important Message Given:  Yes    Gwyn Hieronymus, [place] 09/28/2015, 1:12 PM

## 2015-09-28 NOTE — Evaluation (Signed)
Physical Therapy Evaluation Patient Details Name: [name] [name]. MRN: [id number] DOB: 1940-03-18 Today's Date: 09/28/2015   History of Present Illness  Pt is a 75 y/o F who presented with progressive edema and SOB.  Admitted for acute on chronic diastolic heart failure.  Hypoglycemic episode 7/25 with pt feeling weak and diaphoretic, has been feeling better since.  Pt's PMH includes stasis ulcers Bil LEs, CABG x6, fx of Rt humerus, hearing loss, low back pain, on home O2, TAVR.    Clinical Impression  Pt admitted with above diagnosis. Pt currently with functional limitations due to the deficits listed below (see PT Problem List). [name] [name] presents with pain (believes it is from gout flare up) in Bil great toes and politely refuses OOB activity today.  He requires min assist from his wife for bed mobility.  PTA pt need assist from his wife for safe ambulation and bathing. Wife will be available to provide 24/7 assist at d/c.  Pt will benefit from skilled PT to increase their independence and safety with mobility to allow discharge to the venue listed below.      Follow Up Recommendations Home health PT;Supervision/Assistance - 24 hour    Equipment Recommendations  Other (comment) (TBD as pt progresses with mobility)    Recommendations for Other Services OT consult     Precautions / Restrictions Precautions Precautions: Fall Restrictions Weight Bearing Restrictions: No      Mobility  Bed Mobility Overal bed mobility: Needs Assistance Bed Mobility: Supine to Sit     Supine to sit: Min assist     General bed mobility comments: Wife demonstrated to PT how she assists pt to EOB at home by providing handheld for pt to pull up with.    Transfers                 General transfer comment: Pt politely refused OOB activity due to gout flare up  Ambulation/Gait                Stairs            Wheelchair Mobility    Modified Rankin (Stroke Patients Only)        Balance Overall balance assessment: Needs assistance Sitting-balance support: No upper extremity supported;Feet supported Sitting balance-Leahy Scale: Fair                                       Pertinent Vitals/Pain Pain Assessment: Faces Faces Pain Scale: Hurts even more Pain Location: Bil great toes (pt believes it is a gout flare up) Pain Descriptors / Indicators: Aching;Discomfort;Guarding Pain Intervention(s): Limited activity within patient's tolerance;Monitored during session;Repositioned    Home Living Family/patient expects to be discharged to:: Private residence Living Arrangements: Spouse/significant other Available Help at Discharge: Family;Available 24 hours/day Type of Home: House Home Access: Stairs to enter   [employer] of Steps: 1 Home Layout: One level Home Equipment: Transport planner      Prior Function Level of Independence: Needs assistance   Gait / Transfers Assistance Needed: Ambulating short distances in home only, no AD.  1 fall in the past 6 months when getting into bed.  Uses electric scooter when out of the house.  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  Wife assists  with bathing, dressing.  Wife does the cooking, cleaning, driving.        Hand Dominance        Extremity/Trunk Assessment   Upper Extremity Assessment: Defer to OT evaluation           Lower Extremity Assessment: RLE deficits/detail;LLE deficits/detail RLE Deficits / Details: Unna boots Bil LEs due to h/o stasis ulcers.  Strength grossly 4/5 but unable to formally test ankle/feet due to gout flare up.   LLE Deficits / Details: Unna boots Bil LEs due to h/o stasis ulcers.  Strength grossly 4/5 but unable to formally test ankle/feet due to gout flare up.    Cervical / Trunk Assessment: Kyphotic   Communication   Communication: HOH (understands wife extremely well)  Cognition Arousal/Alertness: Awake/alert Behavior During Therapy: WFL for tasks assessed/performed Overall Cognitive Status: History of cognitive impairments - at baseline                      General Comments      Exercises General Exercises - Lower Extremity Long Arc Quad: AROM;Both;10 reps;Seated      Assessment/Plan    PT Assessment Patient needs continued PT services  PT Diagnosis Difficulty walking;Acute pain   PT Problem List Decreased strength;Decreased activity tolerance;Decreased balance;Pain;Decreased skin integrity;Impaired sensation;Cardiopulmonary status limiting activity;Decreased safety awareness;Decreased knowledge of use of DME;Decreased cognition  PT Treatment Interventions DME instruction;Gait training;Stair training;Functional mobility training;Therapeutic activities;Therapeutic exercise;Balance training;Cognitive remediation;Patient/family education;Wheelchair mobility training   PT Goals (Current goals can be found in the Care Plan section) Acute Rehab PT Goals Patient Stated Goal: decreased pain PT Goal Formulation: With patient/family Time For Goal Achievement: 10/12/15 Potential to Achieve Goals: Good    Frequency Min 3X/week   Barriers to discharge        Co-evaluation               End of Session Equipment Utilized During Treatment: Oxygen Activity Tolerance: Patient limited by pain Patient left: in bed;with call bell/phone within reach;with family/visitor present;Other (comment) (sitting EOB) Nurse Communication: Mobility status;Other (comment) (pt sitting EOB)         Time: [id number] PT Time Calculation (min) (ACUTE ONLY): 23 min   Charges:   PT Evaluation $PT Eval Low Complexity: 1 Procedure PT Treatments $Therapeutic Activity: 8-22 mins   PT G Codes:       Collie Siad PT, DPT  Pager: [phone] Phone: [phone] 09/28/2015, 4:02 PM

## 2015-09-28 NOTE — Consult Note (Addendum)
[place] Nurse wound follow up Wound type: Pt is followed by the outpatient wound care center for dressings and bilat Una boots.  Removed compression wraps to assess wounds. Measurement: Left middle calf with full thickness wound; .5X7X.1cm, red and moist, small amt yellow drainage, no odor. Right middle calf with healing full thickness wound; .2X.2X.1cm, red dry wound bed, no odor, scant amt yellow drainage, Dressing procedure/placement/frequency: Continue present plan of care with foam dressing to right leg, and hydrofiber and foam dressing to left leg.  Ortho tech notified to applty [facility] and coban and change Q Wed and Mon. Pt can resume follow-up with the wound care center after discharge. Please re-consult if further assistance is needed.  Thank-you,  Julien Girt MSN, [place], [place], [place], [place]

## 2015-09-28 NOTE — Progress Notes (Signed)
Orthopedic Tech Progress Note Patient Details:  [name] [name] 10/25/1940 [id number]  Ortho Devices Type of Ortho Device: Louretta Parma boot Ortho Device/Splint Location: bilateral Ortho Device/Splint Interventions: Application   Hildred Priest 09/28/2015, 9:46 AM

## 2015-09-28 NOTE — Progress Notes (Signed)
Initial Nutrition Assessment  INTERVENTION:  Provide Glucerna Shake po BID, each supplement provides 220 kcal and 10 grams of protein Monitor PO intake for adequacy Re-visit when patient is awake/alert to assess education needs   NUTRITION DIAGNOSIS:   Inadequate oral intake related to poor appetite as evidenced by per patient/family report, meal completion < 50%.   GOAL:   Patient will meet greater than or equal to 90% of their needs   MONITOR:   PO intake, Supplement acceptance, Labs, Weight trends, Skin, I & O's  REASON FOR ASSESSMENT:   Malnutrition Screening Tool    ASSESSMENT:   75 y.o. male with past medical history of CHF, T2DM, and CABG presents to the office for evaluation of progressive edema and shortness of breath.  Pt asleep at time of visit. Per pt's wife at bedside pt has been eating less than 50% compared to usual for the past month. Wife reports that patient has been eating small snacks of cheese and crackers and small amounts of soup. She states that patient has gained weight due to fluid/edema. Pt follows a low sodium diet at home per wife; they use low salt substitute. Per nursing notes, pt has been eating 50-75% of meals. Pt has complained about disliking the hospital food per wife.  RD discussed the importance of adequate nutrition intake, low sodium diet, and fluid restriction.   Labs: low potassium, low chloride, elevated BUN, low hemoglobin  Diet Order:  Diet Carb Modified Fluid consistency: Thin; Room service appropriate? Yes  Skin:  Reviewed, no issues  Last BM:  7/25  Height:   Ht Readings from Last 1 Encounters:  09/27/15 5\' 10"  (1.778 m)    Weight:   Wt Readings from Last 1 Encounters:  09/28/15 223 lb (101.2 kg)    Ideal Body Weight:  75.5 kg  BMI:  Body mass index is 32 kg/m.  Estimated Nutritional Needs:   Kcal:  1900-2100  Protein:  90-100 grams  Fluid:  per MD  EDUCATION NEEDS:   No education needs identified at  this time  [place], LDN Inpatient Clinical Dietitian Pager: [phone] After Hours Pager: [phone]

## 2015-09-28 NOTE — Progress Notes (Signed)
Inpatient Diabetes Program Recommendations  AACE/ADA: New Consensus Statement on Inpatient Glycemic Control (2015)  Target Ranges:  Prepandial:   less than 140 mg/dL      Peak postprandial:   less than 180 mg/dL (1-2 hours)      Critically ill patients:  140 - 180 mg/dL   Lab Results  Component Value Date   GLUCAP 257 (H) 09/28/2015   HGBA1C 8.4 (H) 04/14/2015    Review of Glycemic Control Results for [name] [name], [name] [name] (MRN [id number]) as of 09/28/2015 09:51  Ref. Range 09/27/2015 16:26 09/27/2015 21:13 09/28/2015 01:27 09/28/2015 02:07  Glucose-Capillary Latest Ref Range: 65 - 99 mg/dL 217 (H) 172 (H) 46 (L) 107 (H)    Diabetes history: DM 2 Outpatient Diabetes medications: Toujeo 40 units tidwc, humalog meal coverage 10 units tidwc with correction of 3-7 units, Januvia 100 at HS Prandin 2 mg tidwc  Current orders for Inpatient glycemic control:  Moderate correction tidwc, Novolog 10 units tidwc, Prandin 2 mg tidwc,  Tradjenta 5 mg at [facility]  Inpatient Diabetes Program Recommendations:    Patient with hypoglycemia early this am. Noted patient received 10 units meal coverage and correction insulin, then prandin 2 mg  In light of chronic renal failure, please consider discontinuing the prandin order as he is getting meal coverage and correction. Also please consider using the sensitive correction scale tidwc rather than the moderate scale. Noted patient takes toujeo 40 units tidwc at home as well. (will confirm that home dose with patient).  Patient may need some basal lantus if fasting glucose is elevated. Will follow.  Thank you Rosita Kea, RN, MSN, CDE  Diabetes Inpatient Program Office: [phone] Pager: [phone] 8:00 am to 5:00 pm

## 2015-09-28 NOTE — Progress Notes (Signed)
    Subjective:  Feeling better. Breathing improved. Hypoglycemic episode occurred yesterday - he felt weak and diaphoretic. Feeling better this am.  Objective:  Vital Signs in the last 24 hours: Temp:  [97.4 F (36.3 C)-98.4 F (36.9 C)] 97.4 F (36.3 C) (07/26 0759) Pulse Rate:  [63-89] 86 (07/26 0759) Resp:  [20-21] 20 (07/26 0759) BP: (83-150)/(49-80) 118/51 (07/26 0759) SpO2:  [98 %-100 %] 100 % (07/26 0759) Weight:  [101.2 kg (223 lb)-105.1 kg (231 lb 12.8 oz)] 101.2 kg (223 lb) (07/26 0707)  Intake/Output from previous day: 07/25 0701 - 07/26 0700 In: 840 [P.O.:840] Out: 3550 [Urine:3550]  Physical Exam: Pt is alert and oriented, NAD HEENT: normal Neck: JVP - remains elevated Lungs: CTA bilaterally CV: RRR with 2/6 SEM at the RUSB Abd: soft, NT Ext: 1+ bilateral pretibial edema Skin: bilateral leg ulcers stable, clean bases   Lab Results:  Recent Labs  09/27/15 1402  WBC 7.3  HGB 8.9*  PLT 204    Recent Labs  09/27/15 1402 09/28/15 0256  NA 133* 136  K 3.6 2.9*  CL 93* 94*  CO2 31 32  GLUCOSE 203* 142*  BUN 52* 50*  CREATININE 2.18* 2.02*   No results for input(s): TROPONINI in the last 72 hours.  Invalid input(s): CK, MB  Assessment/Plan:  1. Acute on chronic diastolic heart failure: 3L urine output yesterday - good diuresis and renal indices stable. Still volume overloaded on exam - continue IV lasix today.   2. Hypokalemia: secondary to diuresis, replete K. Hold metolazone today.  3. Severe mitral stenosis  4. Type II DM: modify SSI to less aggressive regimen to avoid hypoglycemia while hospitalized. Will continue to give 10 units insulin with meals but will reduce SSI regimen  5. CKD IV: renal function stable today. Repeat BMET in am.  Overall good progress during first 24 hours of hospitalization, will FU in am. Anticipate another 48 hours in hospital. He understands overall prognosis is poor with limited treatment options for his  advanced cardiac disease.  [name], M.D. 09/28/2015, 9:16 AM

## 2015-09-29 ENCOUNTER — Encounter (HOSPITAL_COMMUNITY): Payer: Self-pay | Admitting: *Deleted

## 2015-09-29 LAB — BASIC METABOLIC PANEL
ANION GAP: 11 (ref 5–15)
BUN: 50 mg/dL — ABNORMAL HIGH (ref 6–20)
CHLORIDE: 92 mmol/L — AB (ref 101–111)
CO2: 30 mmol/L (ref 22–32)
Calcium: 8.8 mg/dL — ABNORMAL LOW (ref 8.9–10.3)
Creatinine, Ser: 2.17 mg/dL — ABNORMAL HIGH (ref 0.61–1.24)
GFR, EST AFRICAN AMERICAN: 33 mL/min — AB (ref >60)
GFR, EST NON AFRICAN AMERICAN: 28 mL/min — AB (ref >60)
Glucose, Bld: 179 mg/dL — ABNORMAL HIGH (ref 65–99)
POTASSIUM: 3.4 mmol/L — AB (ref 3.5–5.1)
SODIUM: 133 mmol/L — AB (ref 135–145)

## 2015-09-29 LAB — GLUCOSE, CAPILLARY
GLUCOSE-CAPILLARY: 155 mg/dL — AB (ref 65–99)
GLUCOSE-CAPILLARY: 219 mg/dL — AB (ref 65–99)
GLUCOSE-CAPILLARY: 136 mg/dL — AB (ref 65–99)
Glucose-Capillary: 156 mg/dL — ABNORMAL HIGH (ref 65–99)

## 2015-09-29 NOTE — Progress Notes (Signed)
Physical Therapy Treatment Patient Details Name: [name] [name]. MRN: [id number] DOB: Jan 10, 1941 Today's Date: 09/29/2015    History of Present Illness Pt is a 75 y/o F who presented with progressive edema and SOB.  Admitted for acute on chronic diastolic heart failure.  Hypoglycemic episode 7/25 with pt feeling weak and diaphoretic, has been feeling better since.  Pt's PMH includes stasis ulcers Bil LEs, CABG x6, fx of Rt humerus, hearing loss, low back pain, on home O2, TAVR.    PT Comments    Per NT and pt's wife, pt just ambulated to/from bathroom, which is more activity than he was able to do yesterday. He declined further ambulation with PT as he had increased B foot pain from walking. He agreed to seated therapeutic exercise. Instructed pt/wife in seated BUE/LE exercises and in posture correction.    Follow Up Recommendations  Home health PT;Supervision/Assistance - 24 hour     Equipment Recommendations  Other (comment) (TBD as pt progresses with mobility)    Recommendations for Other Services OT consult     Precautions / Restrictions Precautions Precautions: Fall Restrictions Weight Bearing Restrictions: No    Mobility  Bed Mobility               General bed mobility comments: NT- up in recliner  Transfers                 General transfer comment: NT- pt had just walked to bathroom with NT, pt stated his gout pain had increased after walking to bathroom and he declined further ambulation. Noted pt has significant forward head posture in sitting, encouraged pt to maintain head in neutral position.   Ambulation/Gait                 Stairs            Wheelchair Mobility    Modified Rankin (Stroke Patients Only)       Balance                                    Cognition Arousal/Alertness: Lethargic (pt frequently fell asleep during PT session, did awaken to verbal stimulii) Behavior During Therapy: WFL for tasks  assessed/performed Overall Cognitive Status: History of cognitive impairments - at baseline                      Exercises General Exercises - Upper Extremity Shoulder Flexion: AROM;AAROM;Both;10 reps;Seated General Exercises - Lower Extremity Ankle Circles/Pumps: AROM;Both;10 reps;Seated Long Arc Quad: AROM;Both;10 reps;Seated Hip Flexion/Marching: AROM;Both;10 reps;Seated    General Comments        Pertinent Vitals/Pain Faces Pain Scale: Hurts even more Pain Location: B great toes Pain Descriptors / Indicators: Constant Pain Intervention(s): Limited activity within patient's tolerance;Monitored during session    Home Living                      Prior Function            PT Goals (current goals can now be found in the care plan section) Acute Rehab PT Goals Patient Stated Goal: decreased pain, be able to walk farther PT Goal Formulation: With patient/family Time For Goal Achievement: 10/12/15 Potential to Achieve Goals: Good Progress towards PT goals: Progressing toward goals    Frequency  Min 3X/week    PT Plan Current plan remains appropriate    Co-evaluation  End of Session Equipment Utilized During Treatment: Oxygen Activity Tolerance: Patient limited by pain Patient left: with call bell/phone within reach;with family/visitor present;in chair (sitting EOB)     Time: [id number] PT Time Calculation (min) (ACUTE ONLY): 15 min  Charges:  $Therapeutic Exercise: 8-22 mins                    G Codes:      Philomena Doheny 09/29/2015, 12:14 PM [phone]

## 2015-09-29 NOTE — Progress Notes (Signed)
Patient Name: [name] [name]. Date of Encounter: 09/29/2015  Active Problems:   Acute on chronic diastolic heart failure (HCC)   Acute on chronic diastolic CHF (congestive heart failure), NYHA class 4 ([place])   Primary Cardiologist: Dr. [name] Patient Profile: 75 year old male with a past medical history of CAD s/p CABG, chronic diastolic CHF, CKD stage III, carotid artery disease, mitral stenosis, AS s/p TAVR in 2015, and DM. He presented to the office on 09/27/15 with SOB and weakness. Admitted for IV diuresis.   SUBJECTIVE: Feels well, says his breathing is much better. Denies orthopnea, slept in bed last night with one pillow.    OBJECTIVE Vitals:   09/28/15 1633 09/28/15 2027 09/29/15 0029 09/29/15 0503  BP: 116/65 (!) 123/53 115/60 116/61  Pulse: 81 66 71 76  Resp: 18 20 18 18   Temp: 97.6 F (36.4 C) 97.9 F (36.6 C) 98 F (36.7 C) 97.6 F (36.4 C)  TempSrc: Oral Oral Oral Oral  SpO2: 96% 100% 98% 99%  Weight:    223 lb 12.8 oz (101.5 kg)  Height:        Intake/Output Summary (Last 24 hours) at 09/29/15 0730 Last data filed at 09/29/15 0720  Gross per 24 hour  Intake              720 ml  Output             2600 ml  Net            -1880 ml   Filed Weights   09/28/15 0641 09/28/15 0707 09/29/15 0503  Weight: 223 lb (101.2 kg) 223 lb (101.2 kg) 223 lb 12.8 oz (101.5 kg)    PHYSICAL EXAM General: Well developed, well nourished, male in no acute distress. Head: Normocephalic, atraumatic.  Neck: Supple without bruits, JVD 5-6 cm above clavicle  Lungs:  Resp regular and unlabored, diminished in bases.  Heart: RRR, S1, S2, no S3, S4, 2/6 systolic murmur; no rub. Abdomen: Taut, non-tender, non-distended, BS + x 4.  Extremities: No clubbing, cyanosis, sacral edema noted, cannot assess pretibial edema, legs are wrapped.  Neuro: Alert and oriented X 3. Moves all extremities spontaneously. Psych: Normal affect.  LABS: CBC: Recent Labs  09/27/15 1402  WBC 7.3    NEUTROABS 5.8  HGB 8.9*  HCT 31.9*  MCV 66.9*  PLT 0000000    Basic Metabolic Panel: Recent Labs  09/28/15 0256 09/29/15 0321  NA 136 133*  K 2.9* 3.4*  CL 94* 92*  CO2 32 30  GLUCOSE 142* 179*  BUN 50* 50*  CREATININE 2.02* 2.17*  CALCIUM 9.0 8.8*   Liver Function Tests: Recent Labs  09/27/15 1402  AST 23  ALT 12*  ALKPHOS 72  BILITOT 0.9  PROT 6.8  ALBUMIN 3.3*   Thyroid Function Tests: Recent Labs  09/27/15 1402  TSH 2.659     Current Facility-Administered Medications:  .  0.9 %  sodium chloride infusion, 250 mL, Intravenous, PRN, [name], MD .  acetaminophen (TYLENOL) tablet 650 mg, 650 mg, Oral, Q4H PRN, [name], MD, 650 mg at 09/28/15 2110 .  ALPRAZolam Duanne Moron) tablet 0.25 mg, 0.25 mg, Oral, Daily, [name], MD, 0.25 mg at 09/28/15 1010 .  aspirin EC tablet 81 mg, 81 mg, Oral, Daily, [name], MD, 81 mg at 09/28/15 0958 .  clopidogrel (PLAVIX) tablet 75 mg, 75 mg, Oral, Q breakfast, [name], MD, 75 mg at 09/28/15 [id number] .  colchicine tablet  0.6 mg, 0.6 mg, Oral, Daily, Arbutus Leas, NP, 0.6 mg at 09/28/15 1651 .  enoxaparin (LOVENOX) injection 40 mg, 40 mg, Subcutaneous, Daily, [name], MD, 40 mg at 09/28/15 0956 .  famotidine (PEPCID) tablet 20 mg, 20 mg, Oral, Daily, [name], MD, 20 mg at 09/28/15 0958 .  feeding supplement (GLUCERNA SHAKE) (GLUCERNA SHAKE) liquid 237 mL, 237 mL, Oral, BID BM, [name], MD .  furosemide (LASIX) injection 80 mg, 80 mg, Intravenous, BID, [name], MD, 80 mg at 09/28/15 1859 .  insulin aspart (novoLOG) injection 0-15 Units, 0-15 Units, Subcutaneous, TID WC, [name], MD, 3 Units at 09/29/15 0636 .  insulin aspart (novoLOG) injection 10 Units, 10 Units, Subcutaneous, TID WC, [name], MD, 10 Units at 09/28/15 1801 .  linagliptin (TRADJENTA) tablet 5 mg, 5 mg, Oral, Daily, [name], MD, 5 mg at 09/28/15 0957 .  methocarbamol (ROBAXIN) tablet 500 mg, 500  mg, Oral, Daily PRN, [name], MD .  metolazone (ZAROXOLYN) tablet 2.5 mg, 2.5 mg, Oral, Hardie Pulley, MD, 2.5 mg at 09/28/15 0957 .  nitroGLYCERIN (NITROSTAT) SL tablet 0.4 mg, 0.4 mg, Sublingual, Q5 min PRN, [name], MD .  ondansetron [facility]) injection 4 mg, 4 mg, Intravenous, Q6H PRN, [name], MD .  pantoprazole (PROTONIX) EC tablet 40 mg, 40 mg, Oral, q morning - 10a, [name], MD, 40 mg at 09/28/15 0957 .  polyethylene glycol (MIRALAX / GLYCOLAX) packet 17 g, 17 g, Oral, Daily PRN, [name], MD .  potassium chloride SA (K-DUR,KLOR-CON) CR tablet 60 mEq, 60 mEq, Oral, TID, [name], MD, 60 mEq at 09/28/15 2111 .  repaglinide (PRANDIN) tablet 2 mg, 2 mg, Oral, TID AC, [name], MD, 2 mg at 09/29/15 [phone] .  rosuvastatin (CRESTOR) tablet 20 mg, 20 mg, Oral, QHS, [name], MD, 20 mg at 09/28/15 2111 .  sertraline (ZOLOFT) tablet 100 mg, 100 mg, Oral, Daily, [name], MD, 100 mg at 09/28/15 0958 .  sodium chloride flush (NS) 0.9 % injection 3 mL, 3 mL, Intravenous, Q12H, [name], MD, 3 mL at 09/28/15 2113 .  sodium chloride flush (NS) 0.9 % injection 3 mL, 3 mL, Intravenous, PRN, [name], MD .  traMADol Veatrice Bourbon) tablet 100 mg, 100 mg, Oral, Q12H PRN, [name], MD, 100 mg at 09/29/15 0159 .  Zinc Oxide (TRIPLE PASTE) 12.8 % ointment, , Topical, PRN, [name], MD   TELE:  NSR, RBBB      Radiology/Studies: Dg Chest Port 1 View  Result Date: 09/28/2015 CLINICAL DATA:  Shortness of breath with CHF. EXAM: PORTABLE CHEST 1 VIEW COMPARISON:  02/10/2015. FINDINGS: 0541 hours. The cardio pericardial silhouette is enlarged. Vascular congestion noted with interstitial pulmonary edema. No focal airspace consolidation. No substantial pleural effusion. Left basilar atelectasis noted. Bones are diffusely demineralized. Telemetry leads overlie the chest. IMPRESSION: Cardiomegaly with vascular congestion and interstitial  pulmonary edema. Electronically Signed   By: Misty Stanley M.D.   On: 09/28/2015 07:08    Current Medications:  . ALPRAZolam  0.25 mg Oral Daily  . aspirin EC  81 mg Oral Daily  . clopidogrel  75 mg Oral Q breakfast  . colchicine  0.6 mg Oral Daily  . enoxaparin (LOVENOX) injection  40 mg Subcutaneous Daily  . famotidine  20 mg Oral Daily  . feeding supplement (GLUCERNA SHAKE)  237 mL Oral BID BM  . furosemide  80 mg Intravenous BID  . insulin aspart  0-15 Units Subcutaneous TID  WC  . insulin aspart  10 Units Subcutaneous TID WC  . linagliptin  5 mg Oral Daily  . metolazone  2.5 mg Oral QODAY  . pantoprazole  40 mg Oral q morning - 10a  . potassium chloride  60 mEq Oral TID  . repaglinide  2 mg Oral TID AC  . rosuvastatin  20 mg Oral QHS  . sertraline  100 mg Oral Daily  . sodium chloride flush  3 mL Intravenous Q12H      ASSESSMENT AND PLAN: Active Problems:   Acute on chronic diastolic heart failure (HCC)   Acute on chronic diastolic CHF (congestive heart failure), NYHA class 4 ([place])  1. Acute on chronic diastolic heart failure, NYHA functional class IV: Has diuresed well, -4.5 L for admission. His weight is down 4 pounds from admission. He feels much better, still appears volume overloaded. Will continue IV lasix for today and transition to po tomorrow.   Continue fluid restriction and daily weights.   2. Hypokalemia: 3.4 today. Potassium repleted.  3. DM: No hypoglycemic events noted overnight, his SSI was modified yesterday to moderate coverage. Also, is getting meal coverage.   4. CKD IV: Cr is 2.17, stable. GFR 28. Continue IV diuresis. He is not on ACE or ARB.   Signed, Arbutus Leas , NP 7:30 AM 09/29/2015 Pager [phone]  Patient seen, examined. Available data reviewed. Agree with findings, assessment, and plan as outlined by Jettie Booze, NP. Complaining of pain in both feet. Feels like gout based on past history. The patient is alert and oriented, sitting in  a chair. Wife at bedside. JVP remains markedly elevated. Lungs CTA, Heart RRR with 2/6 systolic murmur at the RUSB, abdomen nontender, lower extremities are wrapped, but less edema at the knees, now trace-1+. Labs reviewed. Continue IV diuresis. Colchicine for gout.    [name], M.D. 09/29/2015 11:48 AM

## 2015-09-30 LAB — GLUCOSE, CAPILLARY
GLUCOSE-CAPILLARY: 126 mg/dL — AB (ref 65–99)
GLUCOSE-CAPILLARY: 191 mg/dL — AB (ref 65–99)
Glucose-Capillary: 68 mg/dL (ref 65–99)
Glucose-Capillary: 99 mg/dL (ref 65–99)
Glucose-Capillary: 270 mg/dL — ABNORMAL HIGH (ref 65–99)
Glucose-Capillary: 196 mg/dL — ABNORMAL HIGH (ref 65–99)

## 2015-09-30 LAB — BASIC METABOLIC PANEL
Anion gap: 10 (ref 5–15)
BUN: 55 mg/dL — AB (ref 6–20)
CALCIUM: 9.2 mg/dL (ref 8.9–10.3)
CO2: 31 mmol/L (ref 22–32)
CREATININE: 2.19 mg/dL — AB (ref 0.61–1.24)
Chloride: 94 mmol/L — ABNORMAL LOW (ref 101–111)
GFR calc Af Amer: 33 mL/min — ABNORMAL LOW (ref >60)
GFR, EST NON AFRICAN AMERICAN: 28 mL/min — AB (ref >60)
GLUCOSE: 109 mg/dL — AB (ref 65–99)
Potassium: 4.5 mmol/L (ref 3.5–5.1)
Sodium: 135 mmol/L (ref 135–145)

## 2015-09-30 MED ORDER — POTASSIUM CHLORIDE CRYS ER 20 MEQ PO TBCR
40.0000 meq | EXTENDED_RELEASE_TABLET | Freq: Two times a day (BID) | ORAL | Status: AC
  Administered 2015-09-30 – 2015-10-05 (×11): 40 meq via ORAL
  Filled 2015-09-30 (×11): qty 2

## 2015-09-30 MED ORDER — METOLAZONE 2.5 MG PO TABS
2.5000 mg | ORAL_TABLET | Freq: Every day | ORAL | Status: DC
  Administered 2015-10-01: 2.5 mg via ORAL
  Filled 2015-09-30: qty 1

## 2015-09-30 MED ORDER — INSULIN ASPART 100 UNIT/ML ~~LOC~~ SOLN
0.0000 [IU] | Freq: Three times a day (TID) | SUBCUTANEOUS | Status: DC
  Administered 2015-09-30: 8 [IU] via SUBCUTANEOUS
  Administered 2015-09-30: 3 [IU] via SUBCUTANEOUS
  Administered 2015-10-01: 2 [IU] via SUBCUTANEOUS
  Administered 2015-10-01: 5 [IU] via SUBCUTANEOUS
  Administered 2015-10-01: 8 [IU] via SUBCUTANEOUS
  Administered 2015-10-02: 5 [IU] via SUBCUTANEOUS
  Administered 2015-10-02: 3 [IU] via SUBCUTANEOUS
  Administered 2015-10-02: 2 [IU] via SUBCUTANEOUS
  Administered 2015-10-03: 3 [IU] via SUBCUTANEOUS
  Administered 2015-10-03: 5 [IU] via SUBCUTANEOUS
  Administered 2015-10-03: 13 [IU] via SUBCUTANEOUS
  Administered 2015-10-04: 3 [IU] via SUBCUTANEOUS
  Administered 2015-10-04 (×2): 2 [IU] via SUBCUTANEOUS
  Administered 2015-10-05 (×2): 3 [IU] via SUBCUTANEOUS
  Administered 2015-10-06: 5 [IU] via SUBCUTANEOUS
  Administered 2015-10-06: 8 [IU] via SUBCUTANEOUS
  Administered 2015-10-07: 2 [IU] via SUBCUTANEOUS
  Administered 2015-10-07: 5 [IU] via SUBCUTANEOUS
  Administered 2015-10-07: 2 [IU] via SUBCUTANEOUS
  Administered 2015-10-08: 3 [IU] via SUBCUTANEOUS
  Administered 2015-10-08: 5 [IU] via SUBCUTANEOUS

## 2015-09-30 MED ORDER — INSULIN ASPART 100 UNIT/ML ~~LOC~~ SOLN
10.0000 [IU] | Freq: Three times a day (TID) | SUBCUTANEOUS | Status: DC
  Administered 2015-09-30 – 2015-10-12 (×31): 10 [IU] via SUBCUTANEOUS

## 2015-09-30 NOTE — Discharge Instructions (Signed)
Blood Urea Nitrogen Test WHY AM I HAVING THIS TEST? The blood urea nitrogen (BUN) test measures the amount of urea nitrogen in your blood. Urea nitrogen is made in the liver. It is a product of digestion and the breakdown of protein. Most diseases or conditions that affect the kidneys or liver can affect the amount of urea in your blood. The BUN test is primarily used to:  Evaluate kidney function.  Evaluate liver function.  Monitor people with kidney dysfunction or failure. The BUN test is often done during routine lab testing. It is usually interpreted along with a test called a creatinine test. The BUN/creatinine ratio is a good indicator of kidney and liver function. WHAT KIND OF SAMPLE IS TAKEN? A blood sample is required for this test. It is usually collected by inserting a needle into a vein. HOW DO I PREPARE FOR THE TEST? There is no preparation required for this test. WHAT ARE THE REFERENCE RANGES? Reference ranges are considered healthy ranges established after testing a large group of healthy people. Reference ranges may vary among different people, labs, and hospitals. It is your responsibility to obtain your test results. Ask the lab or department performing the test when and how you will get your results.  Adult: 10-20 mg/dL or 3.6-7.1 mmol/L (SI units).  Elderly: values may be slightly higher than adult values.  Child: 5-18 mg/dL.  Infant: 5-18 mg/dL.  Newborn: 3-12 mg/dL.  Cord: 21-40 mg/dL. WHAT DO THE RESULTS MEAN? Abnormally high BUN levels may be caused by:  Kidney disease.  Obstruction of the flow of urine.  Overconsumption of protein.  Excessive protein breakdown.  Decrease in blood volume (hypovolemia).  Dehydration.  Advanced pregnancy.  Gastrointestinal bleeding.  Congestive heart failure.  Acute heart attack (myocardial infarction. MI).  Shock.  Sepsis.  Burns.  Certain drugs. Abnormally low BUN levels may be caused by:  Liver  failure.  Excessive hydration.  Early pregnancy.  Malnutrition.  Low-protein, high-carbohydrate diets.  Certain drugs. Talk with your health care provider to discuss your results, treatment options, and if necessary, the need for more tests. Talk with your health care provider if you have any questions about your results.   This information is not intended to replace advice given to you by your health care provider. Make sure you discuss any questions you have with your health care provider.   Document Released: 03/14/2004 Document Revised: 03/12/2014 Document Reviewed: 07/07/2013 Elsevier Interactive Patient Education [employer].

## 2015-09-30 NOTE — Care Management Note (Signed)
Case Management Note  Patient Details  Name: [name] [name]. MRN: [id number] Date of Birth: 19-Aug-1940  Subjective/Objective:    Admitted with CHF               Action/Plan: CM talked to patient with wife at bedside. Patient very [place]; PCP is Dr Damita Dunnings; has private insurance with [facility] with prescription drug coverage; pharmacy of choice is [facility], spouse reports no problem getting medication. He has scales at home but does not weigh himself daily; his wife cooks a heart healthy diet low in sodium. Patient could benefit from a Disease Management Program for CHF; spouse chose [place]; Butch Penny with [facility] called for arrangements. Attending MD at discharge please enter the face to face document for [facility] services. Referral made to [facility] CHF program. Expected Discharge Date:    possible 10/01/2015              Expected Discharge Plan:  [place]  In-House Referral:   [facility]  Discharge planning Services  CM Consult    Choice offered to:  Patient, Spouse  HH Arranged:  RN, Disease Management, PT [place] Agency:  [place]  Status of Service:  In process, will continue to follow  Sherrilyn Rist [id number] 09/30/2015, 9:58 AM

## 2015-09-30 NOTE — Progress Notes (Signed)
Patient Name: [name] [name]. Date of Encounter: 09/30/2015  Active Problems:   Acute on chronic diastolic heart failure (HCC)   Acute on chronic diastolic CHF (congestive heart failure), NYHA class 4 ([place])   Primary Cardiologist: Dr. [name] Patient Profile: 75 year old male with a past medical history of CAD s/p CABG, chronic diastolic CHF, CKD stage III, carotid artery disease, mitral stenosis, AS s/p TAVR in 2015, and DM. He presented to the office on 09/27/15 with SOB and weakness. Admitted with volume overload.  SUBJECTIVE: feels well this am, feels like his breathing is much better. No chest pain.    OBJECTIVE Vitals:   09/29/15 0503 09/29/15 1142 09/29/15 2023 09/30/15 0444  BP: 116/61 90/60 129/62 109/66  Pulse: 76 75 66 64  Resp: 18 18 18 19   Temp: 97.6 F (36.4 C) 97.4 F (36.3 C) 98.2 F (36.8 C) 97.9 F (36.6 C)  TempSrc: Oral Oral Oral Oral  SpO2: 99% 100% 100% 100%  Weight: 223 lb 12.8 oz (101.5 kg)   224 lb (101.6 kg)  Height:        Intake/Output Summary (Last 24 hours) at 09/30/15 0936 Last data filed at 09/30/15 0925  Gross per 24 hour  Intake             1180 ml  Output             2075 ml  Net             -895 ml   Filed Weights   09/28/15 0707 09/29/15 0503 09/30/15 0444  Weight: 223 lb (101.2 kg) 223 lb 12.8 oz (101.5 kg) 224 lb (101.6 kg)    PHYSICAL EXAM General: Well developed, well nourished, male in no acute distress. Head: Normocephalic, atraumatic.  Neck: Supple without bruits, jaw JVD. Lungs:  Resp regular and unlabored, diminished in all lobes.  Heart: RRR, S1, S2, no S3, S4, or murmur; no rub. Abdomen: obese, non-tender, non-distended, BS + x 4.  Extremities: No clubbing, cyanosis, generalized edema.  Neuro: Alert and oriented X 3. Moves all extremities spontaneously. Psych: Normal affect.  LABS: CBC: Recent Labs  09/27/15 1402  WBC 7.3  NEUTROABS 5.8  HGB 8.9*  HCT 31.9*  MCV 66.9*  PLT 0000000   Basic Metabolic  Panel: Recent Labs  09/29/15 0321 09/30/15 0538  NA 133* 135  K 3.4* 4.5  CL 92* 94*  CO2 30 31  GLUCOSE 179* 109*  BUN 50* 55*  CREATININE 2.17* 2.19*  CALCIUM 8.8* 9.2   Liver Function Tests: Recent Labs  09/27/15 1402  AST 23  ALT 12*  ALKPHOS 72  BILITOT 0.9  PROT 6.8  ALBUMIN 3.3*  BNP:  Pro B Natriuretic peptide (BNP)  Date/Time Value Ref Range Status  09/12/2015 03:13 PM 1,387.0 (H) 0.0 - 100.0 pg/mL Final  06/14/2014 11:35 AM 608.0 (H) 0.0 - 100.0 pg/mL Final   Thyroid Function Tests: Recent Labs  09/27/15 1402  TSH 2.659     Current Facility-Administered Medications:  .  0.9 %  sodium chloride infusion, 250 mL, Intravenous, PRN, [name], MD .  acetaminophen (TYLENOL) tablet 650 mg, 650 mg, Oral, Q4H PRN, [name], MD, 650 mg at 09/28/15 2110 .  ALPRAZolam Duanne Moron) tablet 0.25 mg, 0.25 mg, Oral, Daily, [name], MD, 0.25 mg at 09/30/15 0834 .  aspirin EC tablet 81 mg, 81 mg, Oral, Daily, [name], MD, 81 mg at 09/30/15 [id number] .  clopidogrel (PLAVIX) tablet  75 mg, 75 mg, Oral, Q breakfast, [name], MD, 75 mg at 09/30/15 0834 .  colchicine tablet 0.6 mg, 0.6 mg, Oral, Daily, Arbutus Leas, NP, 0.6 mg at 09/30/15 [id number] .  enoxaparin (LOVENOX) injection 40 mg, 40 mg, Subcutaneous, Daily, [name], MD, 40 mg at 09/30/15 [phone] .  famotidine (PEPCID) tablet 20 mg, 20 mg, Oral, Daily, [name], MD, 20 mg at 09/30/15 0834 .  feeding supplement (GLUCERNA SHAKE) (GLUCERNA SHAKE) liquid 237 mL, 237 mL, Oral, BID BM, [name], MD .  furosemide (LASIX) injection 80 mg, 80 mg, Intravenous, BID, [name], MD, 80 mg at 09/30/15 0834 .  insulin aspart (novoLOG) injection 0-15 Units, 0-15 Units, Subcutaneous, TID WC, [name], MD, 2 Units at 09/29/15 1747 .  insulin aspart (novoLOG) injection 10 Units, 10 Units, Subcutaneous, TID WC, [name], MD, 10 Units at 09/30/15 [phone] .  linagliptin (TRADJENTA) tablet 5 mg, 5  mg, Oral, Daily, [name], MD, 5 mg at 09/30/15 [phone] .  methocarbamol (ROBAXIN) tablet 500 mg, 500 mg, Oral, Daily PRN, [name], MD .  metolazone (ZAROXOLYN) tablet 2.5 mg, 2.5 mg, Oral, Hardie Pulley, MD, 2.5 mg at 09/30/15 0834 .  nitroGLYCERIN (NITROSTAT) SL tablet 0.4 mg, 0.4 mg, Sublingual, Q5 min PRN, [name], MD .  ondansetron [facility]) injection 4 mg, 4 mg, Intravenous, Q6H PRN, [name], MD .  pantoprazole (PROTONIX) EC tablet 40 mg, 40 mg, Oral, q morning - 10a, [name], MD, 40 mg at 09/30/15 0834 .  polyethylene glycol (MIRALAX / GLYCOLAX) packet 17 g, 17 g, Oral, Daily PRN, [name], MD .  potassium chloride SA (K-DUR,KLOR-CON) CR tablet 60 mEq, 60 mEq, Oral, TID, [name], MD, 60 mEq at 09/30/15 0834 .  repaglinide (PRANDIN) tablet 2 mg, 2 mg, Oral, TID AC, [name], MD, 2 mg at 09/30/15 [phone] .  rosuvastatin (CRESTOR) tablet 20 mg, 20 mg, Oral, QHS, [name], MD, 20 mg at 09/29/15 2239 .  sertraline (ZOLOFT) tablet 100 mg, 100 mg, Oral, Daily, [name], MD, 100 mg at 09/30/15 0834 .  sodium chloride flush (NS) 0.9 % injection 3 mL, 3 mL, Intravenous, Q12H, [name], MD, 3 mL at 09/30/15 0835 .  sodium chloride flush (NS) 0.9 % injection 3 mL, 3 mL, Intravenous, PRN, [name], MD .  traMADol Veatrice Bourbon) tablet 100 mg, 100 mg, Oral, Q12H PRN, [name], MD, 100 mg at 09/30/15 [id number] .  Zinc Oxide (TRIPLE PASTE) 12.8 % ointment, , Topical, PRN, [name], MD   TELE:  NSR       Current Medications:  . ALPRAZolam  0.25 mg Oral Daily  . aspirin EC  81 mg Oral Daily  . clopidogrel  75 mg Oral Q breakfast  . colchicine  0.6 mg Oral Daily  . enoxaparin (LOVENOX) injection  40 mg Subcutaneous Daily  . famotidine  20 mg Oral Daily  . feeding supplement (GLUCERNA SHAKE)  237 mL Oral BID BM  . furosemide  80 mg Intravenous BID  . insulin aspart  0-15 Units Subcutaneous TID WC  . insulin aspart  10  Units Subcutaneous TID WC  . linagliptin  5 mg Oral Daily  . metolazone  2.5 mg Oral QODAY  . pantoprazole  40 mg Oral q morning - 10a  . potassium chloride  60 mEq Oral TID  . repaglinide  2 mg Oral TID AC  . rosuvastatin  20 mg Oral QHS  . sertraline  100 mg  Oral Daily  . sodium chloride flush  3 mL Intravenous Q12H      ASSESSMENT AND PLAN: Active Problems:   Acute on chronic diastolic heart failure (HCC)   Acute on chronic diastolic CHF (congestive heart failure), NYHA class 4 ([place])  1. Acute on chronic diastolic heart failure, NYHA functional class IV: Has diuresed well, -5.4 L for admission, still with jaw JVD. His weight is down only 3 pounds from admission, he does feel much better. Still remains volume overloaded, will get metolazone today. I think he still needs IV diuresis today.  Continue fluid restriction and daily weights. Creatinine stable.   2. Hypokalemia: K is 4.5 today. Will reduce potassium to 40 BID, will get one dose tonight.   3. DM: No hypoglycemic events noted overnight, his SSI was modified yesterday to moderate coverage. Also, is getting meal coverage.   4. CKD IV: Cr is 2.19, stable. GFR 30. Continue IV diuresis. He is not on ACE or ARB.    Signed, Arbutus Leas , NP 9:36 AM 09/30/2015 Pager [phone]  Patient seen, examined. Available data reviewed. Agree with findings, assessment, and plan as outlined by Jettie Booze, NP. The patient's wife is at the bedside. JVP remains elevated. Lungs are clear. Heart is regular rate and rhythm with a 2/6 systolic murmur at the right upper sternal border. There is persistent 2+ edema in the legs extending up into the thighs. The patient continues to diurese. Will change his metolazone to a daily dose to try to increase diuresis. We had a lengthy discussion today about his poor overall prognosis. I have recommended consideration of home hospice and both the patient and his wife would like to explore this as an  option. We'll place a consultation. I would anticipate that he will remain in the hospital over the weekend for continued IV diuresis.  [name], M.D. 09/30/2015 1:45 PM

## 2015-09-30 NOTE — Progress Notes (Signed)
Physical Therapy Treatment Patient Details Name: [name] [name]. MRN: [id number] DOB: Jul 31, 1940 Today's Date: 09/30/2015    History of Present Illness Pt is a 75 y/o F who presented with progressive edema and SOB.  Admitted for acute on chronic diastolic heart failure.  Hypoglycemic episode 7/25 with pt feeling weak and diaphoretic, has been feeling better since.  Pt's PMH includes stasis ulcers Bil LEs, CABG x6, fx of Rt humerus, hearing loss, low back pain, on home O2, TAVR.    PT Comments    Pt agreeable to OOB mobility/ambulation on today. Mobility is limited by pain, general weakness, and decreased activity tolerance. Will continue to follow and progress activity as tolerated. Recommend RW use for ambulation and HHPT follow up.   Follow Up Recommendations  Home health PT;Supervision/Assistance - 24 hour     Equipment Recommendations  Rolling walker with 5" wheels    Recommendations for Other Services       Precautions / Restrictions Precautions Precautions: Fall Restrictions Weight Bearing Restrictions: No    Mobility  Bed Mobility Overal bed mobility: Needs Assistance Bed Mobility: Supine to Sit;Sit to Supine     Supine to sit: Min guard;HOB elevated Sit to supine: Mod assist   General bed mobility comments: With encouragement, pt was able to sit at EOB without assistance. He did require assist for bil LEs onto bed.  Transfers Overall transfer level: Needs assistance Equipment used: Rolling walker (2 wheeled) Transfers: Sit to/from Stand Sit to Stand: Min assist         General transfer comment: Assist to rise, stabilize, control descent. VCs safety, technique, hand placement.   Ambulation/Gait Ambulation/Gait assistance: Min assist Ambulation Distance (Feet): 15 Feet Assistive device: Rolling walker (2 wheeled) Gait Pattern/deviations: Decreased stride length;Antalgic;Step-through pattern;Decreased step length - right;Decreased step length - left      General Gait Details: very slow gait speed with short steps bilaterally. At least 3 standing rest breaks taken during short distance. Pt reported legs feeling weak in addition to pain in both feet.    Stairs            Wheelchair Mobility    Modified Rankin (Stroke Patients Only)       Balance                                    Cognition Arousal/Alertness: Awake/alert Behavior During Therapy: WFL for tasks assessed/performed Overall Cognitive Status: History of cognitive impairments - at baseline                      Exercises      General Comments        Pertinent Vitals/Pain Pain Assessment: 0-10 Pain Score: 8  Pain Location: bil feet Pain Intervention(s): Limited activity within patient's tolerance    Home Living                      Prior Function            PT Goals (current goals can now be found in the care plan section) Progress towards PT goals: Progressing toward goals (slowly)    Frequency  Min 3X/week    PT Plan Current plan remains appropriate    Co-evaluation             End of Session Equipment Utilized During Treatment: Oxygen Activity Tolerance: Patient limited by fatigue;Patient limited  by pain Patient left: in bed;with call bell/phone within reach;with family/visitor present     Time: 1257-1322 PT Time Calculation (min) (ACUTE ONLY): 25 min  Charges:  $Gait Training: 8-22 mins $Therapeutic Activity: 8-22 mins                    G Codes:      [name] [name], MPT Pager: [phone]

## 2015-09-30 NOTE — Care Management Important Message (Signed)
Important Message  Patient Details  Name: [name] [name]. MRN: [id number] Date of Birth: 30-Aug-1940   Medicare Important Message Given:  Yes    Loann Quill 09/30/2015, 10:36 AM

## 2015-10-01 LAB — GLUCOSE, CAPILLARY
GLUCOSE-CAPILLARY: 144 mg/dL — AB (ref 65–99)
Glucose-Capillary: 237 mg/dL — ABNORMAL HIGH (ref 65–99)
Glucose-Capillary: 283 mg/dL — ABNORMAL HIGH (ref 65–99)
Glucose-Capillary: 97 mg/dL (ref 65–99)

## 2015-10-01 LAB — BASIC METABOLIC PANEL
ANION GAP: 10 (ref 5–15)
BUN: 61 mg/dL — ABNORMAL HIGH (ref 6–20)
CALCIUM: 9 mg/dL (ref 8.9–10.3)
CO2: 30 mmol/L (ref 22–32)
CREATININE: 2.51 mg/dL — AB (ref 0.61–1.24)
Chloride: 93 mmol/L — ABNORMAL LOW (ref 101–111)
GFR calc non Af Amer: 24 mL/min — ABNORMAL LOW (ref >60)
GFR, EST AFRICAN AMERICAN: 28 mL/min — AB (ref >60)
Glucose, Bld: 159 mg/dL — ABNORMAL HIGH (ref 65–99)
Potassium: 4.1 mmol/L (ref 3.5–5.1)
SODIUM: 133 mmol/L — AB (ref 135–145)

## 2015-10-01 NOTE — Progress Notes (Signed)
Patient ID: [name], male   DOB: 09-11-1940, 75 y.o.   MRN: [id number]    Patient Name: [name] [name]. Date of Encounter: 10/01/2015     Active Problems:   Acute on chronic diastolic heart failure (HCC)   Acute on chronic diastolic CHF (congestive heart failure), NYHA class 4 ([place])    SUBJECTIVE  C/o hurting from head to toe, but denies chest pain or sob.  CURRENT MEDS . ALPRAZolam  0.25 mg Oral Daily  . aspirin EC  81 mg Oral Daily  . clopidogrel  75 mg Oral Q breakfast  . colchicine  0.6 mg Oral Daily  . enoxaparin (LOVENOX) injection  40 mg Subcutaneous Daily  . famotidine  20 mg Oral Daily  . feeding supplement (GLUCERNA SHAKE)  237 mL Oral BID BM  . furosemide  80 mg Intravenous BID  . insulin aspart  0-15 Units Subcutaneous TID WC  . insulin aspart  10 Units Subcutaneous TID WC  . linagliptin  5 mg Oral Daily  . metolazone  2.5 mg Oral Daily  . pantoprazole  40 mg Oral q morning - 10a  . potassium chloride  40 mEq Oral BID  . repaglinide  2 mg Oral TID AC  . rosuvastatin  20 mg Oral QHS  . sertraline  100 mg Oral Daily  . sodium chloride flush  3 mL Intravenous Q12H    OBJECTIVE  Vitals:   09/30/15 0444 09/30/15 1137 09/30/15 2130 10/01/15 0646  BP: 109/66 106/65 110/63 107/67  Pulse: 64 65 74 72  Resp: 19 16 17 18   Temp: 97.9 F (36.6 C)  98.3 F (36.8 C) 98.2 F (36.8 C)  TempSrc: Oral  Oral Oral  SpO2: 100% 93% 96% 96%  Weight: 224 lb (101.6 kg)   226 lb 3.2 oz (102.6 kg)  Height:        Intake/Output Summary (Last 24 hours) at 10/01/15 1204 Last data filed at 10/01/15 0647  Gross per 24 hour  Intake              720 ml  Output              895 ml  Net             -175 ml   Filed Weights   09/29/15 0503 09/30/15 0444 10/01/15 0646  Weight: 223 lb 12.8 oz (101.5 kg) 224 lb (101.6 kg) 226 lb 3.2 oz (102.6 kg)    PHYSICAL EXAM  General: Pleasant, chronically ill appearing, NAD. Neuro: Alert and oriented X 3. Moves all extremities  spontaneously. Psych: Normal affect. HEENT:  Normal  Neck: Supple without bruits, 7-8 cm JVD. Lungs:  Resp regular and unlabored, CTA. Heart: RRR no s3, s4, or murmurs. Abdomen: Soft, non-tender, non-distended, BS + x 4.  Extremities: No clubbing, cyanosis, lower legs wrapped. DP/PT/Radials 2+ and equal bilaterally.  Accessory Clinical Findings  CBC No results for input(s): WBC, NEUTROABS, HGB, HCT, MCV, PLT in the last 72 hours. Basic Metabolic Panel  Recent Labs  09/30/15 0538 10/01/15 0348  NA 135 133*  K 4.5 4.1  CL 94* 93*  CO2 31 30  GLUCOSE 109* 159*  BUN 55* 61*  CREATININE 2.19* 2.51*  CALCIUM 9.2 9.0   Liver Function Tests No results for input(s): AST, ALT, ALKPHOS, BILITOT, PROT, ALBUMIN in the last 72 hours. No results for input(s): LIPASE, AMYLASE in the last 72 hours. Cardiac Enzymes No results for input(s): CKTOTAL, CKMB, CKMBINDEX,  TROPONINI in the last 72 hours. BNP Invalid input(s): POCBNP D-Dimer No results for input(s): DDIMER in the last 72 hours. Hemoglobin A1C No results for input(s): HGBA1C in the last 72 hours. Fasting Lipid Panel No results for input(s): CHOL, HDL, LDLCALC, TRIG, CHOLHDL, LDLDIRECT in the last 72 hours. Thyroid Function Tests No results for input(s): TSH, T4TOTAL, T3FREE, THYROIDAB in the last 72 hours.  Invalid input(s): FREET3  TELE  nsr with first degree AV block  Radiology/Studies  Dg Chest Port 1 View  Result Date: 09/28/2015 CLINICAL DATA:  Shortness of breath with CHF. EXAM: PORTABLE CHEST 1 VIEW COMPARISON:  02/10/2015. FINDINGS: 0541 hours. The cardio pericardial silhouette is enlarged. Vascular congestion noted with interstitial pulmonary edema. No focal airspace consolidation. No substantial pleural effusion. Left basilar atelectasis noted. Bones are diffusely demineralized. Telemetry leads overlie the chest. IMPRESSION: Cardiomegaly with vascular congestion and interstitial pulmonary edema. Electronically  Signed   By: Misty Stanley M.D.   On: 09/28/2015 07:08   ASSESSMENT AND PLAN  1. Acute on chronic diastolic heart failure - he has not diuresed much. He will continue his current meds.  2. Acute on chronic renal insufficiency - he has an increase in creatinine. Hopefully this will improved.  3. HTN heart disease - blood pressure is controlled. Will follow.  Janna Oak,M.D.  10/01/2015 12:04 PM

## 2015-10-01 NOTE — Progress Notes (Signed)
Pt declining 10am meds at this point. States he is tired and nauseous. Will continue to monitor and try again at a later time.

## 2015-10-02 LAB — GLUCOSE, CAPILLARY
GLUCOSE-CAPILLARY: 129 mg/dL — AB (ref 65–99)
GLUCOSE-CAPILLARY: 220 mg/dL — AB (ref 65–99)
Glucose-Capillary: 157 mg/dL — ABNORMAL HIGH (ref 65–99)
Glucose-Capillary: 218 mg/dL — ABNORMAL HIGH (ref 65–99)

## 2015-10-02 LAB — BASIC METABOLIC PANEL
Anion gap: 13 (ref 5–15)
BUN: 69 mg/dL — AB (ref 6–20)
CALCIUM: 9 mg/dL (ref 8.9–10.3)
CO2: 28 mmol/L (ref 22–32)
CREATININE: 2.6 mg/dL — AB (ref 0.61–1.24)
Chloride: 92 mmol/L — ABNORMAL LOW (ref 101–111)
GFR calc Af Amer: 26 mL/min — ABNORMAL LOW (ref >60)
GFR, EST NON AFRICAN AMERICAN: 23 mL/min — AB (ref >60)
Glucose, Bld: 118 mg/dL — ABNORMAL HIGH (ref 65–99)
POTASSIUM: 3.8 mmol/L (ref 3.5–5.1)
SODIUM: 133 mmol/L — AB (ref 135–145)

## 2015-10-02 MED ORDER — POLYETHYLENE GLYCOL 3350 17 G PO PACK
17.0000 g | PACK | Freq: Every day | ORAL | Status: DC | PRN
  Administered 2015-10-02 – 2015-10-03 (×2): 17 g via ORAL
  Filled 2015-10-02: qty 1

## 2015-10-02 NOTE — Progress Notes (Signed)
Patient ID: [name] Kinds., male   DOB: 1940/08/19, 75 y.o.   MRN: [id number]    Patient Name: [name] [name]. Date of Encounter: 10/02/2015     Active Problems:   Acute on chronic diastolic heart failure (HCC)   Acute on chronic diastolic CHF (congestive heart failure), NYHA class 4 (HCC)    SUBJECTIVE  Aches and pains appear resolved and he seems less confused.  CURRENT MEDS . ALPRAZolam  0.25 mg Oral Daily  . aspirin EC  81 mg Oral Daily  . clopidogrel  75 mg Oral Q breakfast  . colchicine  0.6 mg Oral Daily  . enoxaparin (LOVENOX) injection  40 mg Subcutaneous Daily  . famotidine  20 mg Oral Daily  . feeding supplement (GLUCERNA SHAKE)  237 mL Oral BID BM  . furosemide  80 mg Intravenous BID  . insulin aspart  0-15 Units Subcutaneous TID WC  . insulin aspart  10 Units Subcutaneous TID WC  . linagliptin  5 mg Oral Daily  . pantoprazole  40 mg Oral q morning - 10a  . potassium chloride  40 mEq Oral BID  . repaglinide  2 mg Oral TID AC  . rosuvastatin  20 mg Oral QHS  . sertraline  100 mg Oral Daily  . sodium chloride flush  3 mL Intravenous Q12H    OBJECTIVE  Vitals:   09/30/15 2130 10/01/15 0646 10/01/15 1225 10/02/15 0542  BP: 110/63 107/67 96/61 (!) 84/51  Pulse: 74 72 66 74  Resp: 17 18 18 18   Temp: 98.3 F (36.8 C) 98.2 F (36.8 C) 97.8 F (36.6 C) 97.6 F (36.4 C)  TempSrc: Oral Oral Oral Oral  SpO2: 96% 96% 98% 99%  Weight:  226 lb 3.2 oz (102.6 kg)  227 lb 9.6 oz (103.2 kg)  Height:        Intake/Output Summary (Last 24 hours) at 10/02/15 0953 Last data filed at 10/02/15 0900  Gross per 24 hour  Intake              896 ml  Output             1575 ml  Net             -679 ml   Filed Weights   09/30/15 0444 10/01/15 0646 10/02/15 0542  Weight: 224 lb (101.6 kg) 226 lb 3.2 oz (102.6 kg) 227 lb 9.6 oz (103.2 kg)    PHYSICAL EXAM  General: Pleasant, NAD. Neuro: Alert and oriented X 3. Moves all extremities spontaneously. Psych: Normal  affect. HEENT:  Normal  Neck: Supple without bruits or JVD. Lungs:  Resp regular and unlabored, CTA. Heart: RRR no s3, s4, or murmurs. Abdomen: Soft, non-tender, non-distended, BS + x 4.  Extremities: No clubbing, cyanosis or edema. DP/PT/Radials 2+ and equal bilaterally.  Accessory Clinical Findings  CBC No results for input(s): WBC, NEUTROABS, HGB, HCT, MCV, PLT in the last 72 hours. Basic Metabolic Panel  Recent Labs  10/01/15 0348 10/02/15 0234  NA 133* 133*  K 4.1 3.8  CL 93* 92*  CO2 30 28  GLUCOSE 159* 118*  BUN 61* 69*  CREATININE 2.51* 2.60*  CALCIUM 9.0 9.0   Liver Function Tests No results for input(s): AST, ALT, ALKPHOS, BILITOT, PROT, ALBUMIN in the last 72 hours. No results for input(s): LIPASE, AMYLASE in the last 72 hours. Cardiac Enzymes No results for input(s): CKTOTAL, CKMB, CKMBINDEX, TROPONINI in the last 72 hours. BNP Invalid input(s): POCBNP  D-Dimer No results for input(s): DDIMER in the last 72 hours. Hemoglobin A1C No results for input(s): HGBA1C in the last 72 hours. Fasting Lipid Panel No results for input(s): CHOL, HDL, LDLCALC, TRIG, CHOLHDL, LDLDIRECT in the last 72 hours. Thyroid Function Tests No results for input(s): TSH, T4TOTAL, T3FREE, THYROIDAB in the last 72 hours.  Invalid input(s): FREET3  TELE  nsr  Radiology/Studies  Dg Chest Port 1 View  Result Date: 09/28/2015 CLINICAL DATA:  Shortness of breath with CHF. EXAM: PORTABLE CHEST 1 VIEW COMPARISON:  02/10/2015. FINDINGS: 0541 hours. The cardio pericardial silhouette is enlarged. Vascular congestion noted with interstitial pulmonary edema. No focal airspace consolidation. No substantial pleural effusion. Left basilar atelectasis noted. Bones are diffusely demineralized. Telemetry leads overlie the chest. IMPRESSION: Cardiomegaly with vascular congestion and interstitial pulmonary edema. Electronically Signed   By: Misty Stanley M.D.   On: 09/28/2015 07:08   ASSESSMENT AND  PLAN 1. Acute on chronic diastolic heart failure - his creatinine is up and weight is not changed despite good diuresis. I will hold metolazone for today. Hopefully he can be discharged tomorrow. 2. Acute on chronic renal insuff. - creatinine has good from 2.0 to 2.6 over the past 4 days. Will hold metolazone. 3. HTN heart disease - blood pressures are good. 4. Disp. - he seems about as dry as we can make him. Anticipate discharge tomorrow. Long term prognosis poor.   Gregg Taylor,M.D.  10/02/2015 9:53 AM

## 2015-10-03 ENCOUNTER — Inpatient Hospital Stay (HOSPITAL_COMMUNITY): Payer: Medicare Other

## 2015-10-03 DIAGNOSIS — I350 Nonrheumatic aortic (valve) stenosis: Secondary | ICD-10-CM

## 2015-10-03 DIAGNOSIS — I509 Heart failure, unspecified: Secondary | ICD-10-CM

## 2015-10-03 DIAGNOSIS — I2510 Atherosclerotic heart disease of native coronary artery without angina pectoris: Secondary | ICD-10-CM

## 2015-10-03 LAB — BASIC METABOLIC PANEL
ANION GAP: 12 (ref 5–15)
BUN: 79 mg/dL — ABNORMAL HIGH (ref 6–20)
CALCIUM: 8.9 mg/dL (ref 8.9–10.3)
CO2: 29 mmol/L (ref 22–32)
CREATININE: 2.75 mg/dL — AB (ref 0.61–1.24)
Chloride: 92 mmol/L — ABNORMAL LOW (ref 101–111)
GFR, EST AFRICAN AMERICAN: 25 mL/min — AB (ref >60)
GFR, EST NON AFRICAN AMERICAN: 21 mL/min — AB (ref >60)
GLUCOSE: 184 mg/dL — AB (ref 65–99)
Potassium: 4 mmol/L (ref 3.5–5.1)
Sodium: 133 mmol/L — ABNORMAL LOW (ref 135–145)

## 2015-10-03 LAB — POCT I-STAT 3, ART BLOOD GAS (G3+)
Acid-Base Excess: 9 mmol/L — ABNORMAL HIGH (ref 0.0–2.0)
BICARBONATE: 29.9 meq/L — AB (ref 20.0–24.0)
O2 Saturation: 100 %
PO2 ART: 184 mmHg — AB (ref 80.0–100.0)
TCO2: 31 mmol/L (ref 0–100)
pCO2 arterial: 28.9 mmHg — ABNORMAL LOW (ref 35.0–45.0)
pH, Arterial: 7.622 (ref 7.350–7.450)

## 2015-10-03 LAB — GLUCOSE, CAPILLARY
GLUCOSE-CAPILLARY: 218 mg/dL — AB (ref 65–99)
GLUCOSE-CAPILLARY: 137 mg/dL — AB (ref 65–99)
Glucose-Capillary: 200 mg/dL — ABNORMAL HIGH (ref 65–99)
Glucose-Capillary: 152 mg/dL — ABNORMAL HIGH (ref 65–99)

## 2015-10-03 LAB — BRAIN NATRIURETIC PEPTIDE: B NATRIURETIC PEPTIDE 5: 1396.3 pg/mL — AB (ref 0.0–100.0)

## 2015-10-03 MED ORDER — ENOXAPARIN SODIUM 30 MG/0.3ML ~~LOC~~ SOLN
30.0000 mg | Freq: Every day | SUBCUTANEOUS | Status: DC
  Administered 2015-10-04 – 2015-10-12 (×9): 30 mg via SUBCUTANEOUS
  Filled 2015-10-03 (×9): qty 0.3

## 2015-10-03 NOTE — Progress Notes (Signed)
Physical Therapy Treatment Patient Details Name: [name] [name]. MRN: [id number] DOB: April 27, 1940 Today's Date: 10/03/2015    History of Present Illness Pt is a 75 y/o F who presented with progressive edema and SOB.  Admitted for acute on chronic diastolic heart failure.  Hypoglycemic episode 7/25 with pt feeling weak and diaphoretic, has been feeling better since.  Pt's PMH includes stasis ulcers Bil LEs, CABG x6, fx of Rt humerus, hearing loss, low back pain, on home O2, TAVR.    PT Comments    Patient sitting in bedside chair with feet on floor on arrival. Unna boots removed by WOC, RN earlier today and have not yet been replaced by ortho tech. Assisted pt back to bed with legs elevated and performed bil LE exercises to attempt to reduce edema prior to bandages donned. Wife present.   Follow Up Recommendations  Home health PT;Supervision/Assistance - 24 hour     Equipment Recommendations  Rolling walker with 5" wheels    Recommendations for Other Services       Precautions / Restrictions Precautions Precautions: Fall Restrictions Weight Bearing Restrictions: No    Mobility  Bed Mobility Overal bed mobility: Needs Assistance Bed Mobility: Sit to Sidelying;Rolling Rolling: Supervision       Sit to sidelying: Mod assist General bed mobility comments: max cues for sit to side; assist to raise legs  Transfers Overall transfer level: Needs assistance Equipment used: None Transfers: Stand Pivot Transfers   Stand pivot transfers: Min guard       General transfer comment: pt uses armrests and UE to rise; stooped posture as pivoting  Ambulation/Gait             General Gait Details: deferred; awaiting replacement unna boot dressings   Stairs            Wheelchair Mobility    Modified Rankin (Stroke Patients Only)       Balance Overall balance assessment: Needs assistance Sitting-balance support: No upper extremity supported;Feet  supported Sitting balance-Leahy Scale: Fair     Standing balance support: No upper extremity supported Standing balance-Leahy Scale: Fair                      Cognition Arousal/Alertness: Lethargic Behavior During Therapy: Flat affect Overall Cognitive Status: Impaired/Different from baseline Area of Impairment: Attention;Following commands;Safety/judgement;Awareness   Current Attention Level: Sustained Memory: Decreased short-term memory;Decreased recall of precautions Following Commands: Follows one step commands with increased time Safety/Judgement: Decreased awareness of deficits     General Comments: very slow to follow commands; poor awareness of need to elevate legs while unna boots off    Exercises General Exercises - Lower Extremity Ankle Circles/Pumps: AROM;Both;15 reps Quad Sets: AROM;Both;10 reps Heel Slides: Both;5 reps;Strengthening (resisted extension)    General Comments General comments (skin integrity, edema, etc.): Educated patient and wife on need to keep legs elevated until bandages replaced      Pertinent Vitals/Pain SaO2 97% on NCO2  Pain Assessment: No/denies pain    Home Living                      Prior Function            PT Goals (current goals can now be found in the care plan section) Acute Rehab PT Goals Patient Stated Goal: decreased pain, be able to walk farther Time For Goal Achievement: 10/12/15 Progress towards PT goals: Progressing toward goals    Frequency  Min 3X/week  PT Plan Current plan remains appropriate    Co-evaluation             End of Session Equipment Utilized During Treatment: Oxygen Activity Tolerance: Patient limited by fatigue;Patient limited by lethargy Patient left: in bed;with call bell/phone within reach;with bed alarm set;with family/visitor present     Time: 1001-1021 PT Time Calculation (min) (ACUTE ONLY): 20 min  Charges:  $Therapeutic Exercise: 8-22 mins                     G Codes:      [name] [name] 11/01/2015, 10:29 AM  Pager [phone]

## 2015-10-03 NOTE — Care Management Important Message (Signed)
Important Message  Patient Details  Name: [name] [name]. MRN: [id number] Date of Birth: [birth year]   Medicare Important Message Given:  Yes    Kadeisha Betsch, Leroy Sea 10/03/2015, 3:21 PM

## 2015-10-03 NOTE — Progress Notes (Signed)
Patient Name: [name] [name]. Date of Encounter: 10/03/2015  Hospital Problem List     Active Problems:   Acute on chronic diastolic heart failure (HCC)   Acute on chronic diastolic CHF (congestive heart failure), NYHA class 4 ([place])    Subjective   Sitting up in the chair. Breathing is ok this morning, but still very dyspneic with minimal activity.  Inpatient Medications    . ALPRAZolam  0.25 mg Oral Daily  . aspirin EC  81 mg Oral Daily  . clopidogrel  75 mg Oral Q breakfast  . colchicine  0.6 mg Oral Daily  . enoxaparin (LOVENOX) injection  40 mg Subcutaneous Daily  . famotidine  20 mg Oral Daily  . feeding supplement (GLUCERNA SHAKE)  237 mL Oral BID BM  . furosemide  80 mg Intravenous BID  . insulin aspart  0-15 Units Subcutaneous TID WC  . insulin aspart  10 Units Subcutaneous TID WC  . linagliptin  5 mg Oral Daily  . pantoprazole  40 mg Oral q morning - 10a  . potassium chloride  40 mEq Oral BID  . repaglinide  2 mg Oral TID AC  . rosuvastatin  20 mg Oral QHS  . sertraline  100 mg Oral Daily  . sodium chloride flush  3 mL Intravenous Q12H    Vital Signs    Vitals:   10/02/15 1144 10/02/15 2103 10/03/15 0210 10/03/15 0623  BP: 114/70 119/79  118/62  Pulse: 73 71  68  Resp: 18 16  17   Temp: 98 F (36.7 C) 97.8 F (36.6 C)  97.2 F (36.2 C)  TempSrc: Oral Oral  Oral  SpO2: 97% 95%  96%  Weight:   229 lb 4.8 oz (104 kg)   Height:        Intake/Output Summary (Last 24 hours) at 10/03/15 0811 Last data filed at 10/03/15 0210  Gross per 24 hour  Intake              577 ml  Output             1125 ml  Net             -548 ml   Filed Weights   10/01/15 0646 10/02/15 0542 10/03/15 0210  Weight: 226 lb 3.2 oz (102.6 kg) 227 lb 9.6 oz (103.2 kg) 229 lb 4.8 oz (104 kg)    Physical Exam    General: Pleasant older male, NAD. Wearing [place] Neuro: Alert and oriented X 3. Moves all extremities spontaneously. Psych: Normal affect. HEENT:  Normal  Neck:  Supple without bruits , + JVD. Lungs:  Resp regular and unlabored, Diminished in lower lobes. Heart: RRR no s3, s4, 2/6 systolic murmur. Abdomen: Soft, non-tender, non-distended, BS + x 4.  Extremities: No clubbing, cyanosis, trace bilateral lower extremity edema. DP/PT/Radials 2+ and equal bilaterally. Lower extremities wrapped.  Labs    CBC No results for input(s): WBC, NEUTROABS, HGB, HCT, MCV, PLT in the last 72 hours. Basic Metabolic Panel  Recent Labs  10/02/15 0234 10/03/15 0320  NA 133* 133*  K 3.8 4.0  CL 92* 92*  CO2 28 29  GLUCOSE 118* 184*  BUN 69* 79*  CREATININE 2.60* 2.75*  CALCIUM 9.0 8.9   Liver Function Tests No results for input(s): AST, ALT, ALKPHOS, BILITOT, PROT, ALBUMIN in the last 72 hours. No results for input(s): LIPASE, AMYLASE in the last 72 hours. Cardiac Enzymes No results for input(s): CKTOTAL, CKMB, CKMBINDEX, TROPONINI  in the last 72 hours. BNP Invalid input(s): POCBNP D-Dimer No results for input(s): DDIMER in the last 72 hours. Hemoglobin A1C No results for input(s): HGBA1C in the last 72 hours. Fasting Lipid Panel No results for input(s): CHOL, HDL, LDLCALC, TRIG, CHOLHDL, LDLDIRECT in the last 72 hours. Thyroid Function Tests No results for input(s): TSH, T4TOTAL, T3FREE, THYROIDAB in the last 72 hours.  Invalid input(s): FREET3  Telemetry    SR  ECG    None  Radiology      Assessment & Plan    75 year old male with a past medical history of CAD s/p CABG, chronic diastolic CHF, CKD stage III, carotid artery disease, mitral stenosis, AS s/p TAVR in 2015, and DM. He presented to the office on 09/27/15 with SOB and weakness. Admitted with volume overload.  1. Acute on Chronic diastolic heart failure: His weight has started to increase despite good diuresis this admission with total UOP of 7L. Wife reports he was feeling better initially into this admission, but seems to be taking some steps backwards. She states he was more  confused last night, and denied his dinner but seems to have improved this morning.  -- Cr continues to rise 2.0>> 2.6 and 2.75 this morning. Will hold morning lasix dose and give evening dose. Does not appear grossly fluid overloaded at this time. Follow BMET -- Wife states Dr. [name] had talked with her and Mr. Hausladen about hospice during the last office visit given his poor prognosis. She is requesting to talk with them inpatient.   2. Acute on chronic renal insuff: Cr continues to rise 2.6>>2.75 this morning. Will hold morning lasix dose and give evening dose. Metolazone held yesterday.   3. HTN heart disease: Blood pressure is stable.  4. DM II: SSI   Signed, Reino Bellis NP-C Pager [phone]

## 2015-10-03 NOTE — Progress Notes (Addendum)
Inpatient Diabetes Program Recommendations  AACE/ADA: New Consensus Statement on Inpatient Glycemic Control (2015)  Target Ranges:  Prepandial:   less than 140 mg/dL      Peak postprandial:   less than 180 mg/dL (1-2 hours)      Critically ill patients:  140 - 180 mg/dL   Lab Results  Component Value Date   GLUCAP 218 (H) 10/03/2015   HGBA1C 8.4 (H) 04/14/2015    Review of Glycemic Control:  Results for [name] [name], [name] [name] (MRN [id number]) as of 10/03/2015 11:27  Ref. Range 10/02/2015 11:04 10/02/2015 15:50 10/02/2015 21:03 10/03/2015 05:45 10/03/2015 11:13  Glucose-Capillary Latest Ref Range: 65 - 99 mg/dL 157 (H) 218 (H) 220 (H) 200 (H) 218 (H)   Diabetes history: Type 2 diabetes Outpatient Diabetes medications: Toujeo 40 units tid (?per medication reconciliation), Humalog 10 units tid with meals, Januvia 100 mg q HS Current orders for Inpatient glycemic control:  Novolog moderate tid with meals, Tradjenta 5 mg daily, Novolog 10 units tid with meals  Inpatient Diabetes Program Recommendations:    Note that medication reconciliation states that patient was taking Toujeo 3 times a day.  Note this is a basal insulin and should only be given once daily.  Consider restart of basal insulin such as Lantus 20 units daily.  At discharge, please only order Toujeo once daily.  Thanks, Adah Perl, RN, BC-ADM Inpatient Diabetes Coordinator Pager [phone] (8a-5p)

## 2015-10-03 NOTE — Progress Notes (Signed)
At 1440 called blood gas result called  to [place], [place] as follows: PH 7.62, C02 28, Pa02 184, and Bicarb 29 .  Instructed he will notify MD.  Karie Kirks, RN.

## 2015-10-03 NOTE — Consult Note (Addendum)
[facility] Nurse wound follow up Wound type: Pt is followed by the outpatient wound care center for bilat Una boots.  Removed compression wraps to assess wounds. Measurement: Left middle calf with full thickness wound which is almost healed; .2X.2X.1cm, pink dry wound bed.  Right middle calf with previous wound has healed; dry intact skin, no drainage. Dressing procedure/placement/frequency: Dressings are no longer indicated underneath the Una boots. Ortho tech notified to applty [facility] and coban and change Q Mon and Thurs. Pt can resume follow-up with the wound care center after discharge. Please re-consult if further assistance is needed.  Thank-you,  Julien Girt MSN, [place], [place], [place], [place]

## 2015-10-04 DIAGNOSIS — Z7189 Other specified counseling: Secondary | ICD-10-CM

## 2015-10-04 DIAGNOSIS — Z515 Encounter for palliative care: Secondary | ICD-10-CM

## 2015-10-04 LAB — CBC WITH DIFFERENTIAL/PLATELET
BASOS ABS: 0.1 10*3/uL (ref 0.0–0.1)
Basophils Relative: 1 %
EOS ABS: 0.1 10*3/uL (ref 0.0–0.7)
EOS PCT: 1 %
HCT: 31.5 % — ABNORMAL LOW (ref 39.0–52.0)
Hemoglobin: 8.6 g/dL — ABNORMAL LOW (ref 13.0–17.0)
Lymphocytes Relative: 11 %
Lymphs Abs: 0.8 10*3/uL (ref 0.7–4.0)
MCH: 18.4 pg — ABNORMAL LOW (ref 26.0–34.0)
MCHC: 27.3 g/dL — ABNORMAL LOW (ref 30.0–36.0)
MCV: 67.3 fL — ABNORMAL LOW (ref 78.0–100.0)
MONO ABS: 0.7 10*3/uL (ref 0.1–1.0)
Monocytes Relative: 9 %
NEUTROS PCT: 78 %
Neutro Abs: 5.9 10*3/uL (ref 1.7–7.7)
PLATELETS: 212 10*3/uL (ref 150–400)
RBC: 4.68 MIL/uL (ref 4.22–5.81)
RDW: 22 % — AB (ref 11.5–15.5)
WBC: 7.6 10*3/uL (ref 4.0–10.5)

## 2015-10-04 LAB — GLUCOSE, CAPILLARY
GLUCOSE-CAPILLARY: 138 mg/dL — AB (ref 65–99)
GLUCOSE-CAPILLARY: 150 mg/dL — AB (ref 65–99)
GLUCOSE-CAPILLARY: 137 mg/dL — AB (ref 65–99)
Glucose-Capillary: 165 mg/dL — ABNORMAL HIGH (ref 65–99)

## 2015-10-04 LAB — BASIC METABOLIC PANEL
Anion gap: 11 (ref 5–15)
BUN: 80 mg/dL — ABNORMAL HIGH (ref 6–20)
CHLORIDE: 91 mmol/L — AB (ref 101–111)
CO2: 30 mmol/L (ref 22–32)
CREATININE: 2.63 mg/dL — AB (ref 0.61–1.24)
Calcium: 9 mg/dL (ref 8.9–10.3)
GFR calc non Af Amer: 23 mL/min — ABNORMAL LOW (ref >60)
GFR, EST AFRICAN AMERICAN: 26 mL/min — AB (ref >60)
Glucose, Bld: 150 mg/dL — ABNORMAL HIGH (ref 65–99)
POTASSIUM: 3.2 mmol/L — AB (ref 3.5–5.1)
Sodium: 132 mmol/L — ABNORMAL LOW (ref 135–145)

## 2015-10-04 MED ORDER — OXYCODONE HCL 5 MG PO TABS
5.0000 mg | ORAL_TABLET | ORAL | Status: DC | PRN
  Administered 2015-10-04: 5 mg via ORAL
  Filled 2015-10-04: qty 1

## 2015-10-04 MED ORDER — SENNOSIDES-DOCUSATE SODIUM 8.6-50 MG PO TABS
2.0000 | ORAL_TABLET | Freq: Every day | ORAL | Status: DC
  Administered 2015-10-04 – 2015-10-11 (×7): 2 via ORAL
  Filled 2015-10-04 (×9): qty 2

## 2015-10-04 MED ORDER — LEVOFLOXACIN 500 MG PO TABS
500.0000 mg | ORAL_TABLET | ORAL | Status: AC
  Administered 2015-10-04 – 2015-10-08 (×3): 500 mg via ORAL
  Filled 2015-10-04 (×5): qty 1

## 2015-10-04 MED ORDER — POTASSIUM CHLORIDE CRYS ER 20 MEQ PO TBCR
20.0000 meq | EXTENDED_RELEASE_TABLET | Freq: Once | ORAL | Status: AC
  Administered 2015-10-04: 20 meq via ORAL
  Filled 2015-10-04: qty 1

## 2015-10-04 NOTE — Progress Notes (Signed)
Received consult patient is to go home with hospice services. CM talked to pt/ spouse to offer home hospice choice, spouse chose [facility]. Referral placed as requested. They will see the pt/ spouse today to answer all of their questions about home hospice care; Aneta Mins [phone]

## 2015-10-04 NOTE — Consult Note (Signed)
Consultation Note Date: 10/04/2015   Patient Name: [name] [name].  DOB: 1940/08/10  MRN: [id number]  Age / Sex: 75 y.o., male  PCP: Tonia Ghent, MD Referring Physician: [name], MD  Reason for Consultation: Establishing goals of care, Hospice Evaluation, Non pain symptom management, Pain control and Psychosocial/spiritual support  HPI/Patient Profile: 75 y.o. male  with past medical history of diastolic heart failure, TAVR, calcified mitral valve, pulmonary HTN, CAD s/p CABG x 6v and CKD who was admitted on 09/27/2015 with leg and abdominal swelling as well as SOB. He was diuresed and is currently down 8L this admission.  He is improved, but is eating very little and still having difficulty with SOB and ambulation.  His creatinine has risen from a base line of 2 in march to approximately 2.6.   CXR from 7/31 shows increased basilar opacity and their is concern for pneumonia.  He has been started on Levaquin.  Clinical Assessment and Goals of Care: I met at bedside with the patient and his wife.  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  He and his wife have two sons.  One of whom lives locally and is able to help the patient in and out of the house at times.  He proudly shows me pictures of his grand daughter and her standard bred.  He is a proud man who enjoys a good joke and would like to maintain as much independence as possible.  Due to valvular stiffening, HF and worsening kidney function, Cardiology is recommending a Palliative Medicine consult and Hospice services.  We discussed prognosis in terms of months.  We talked about neck pain, back pain, and dyspnea.  I recommended switching from tramadol to oxycodone to ease both the pain and the dyspnea.  He is still constipated.  I d/c'd miralax and added Senna/s as it has a stimulant laxative in it.  Heating pad PRN to the back.   Chaplain services requested to visit.  Order placed for SW to offer Hospice at home.  We discussed the types of services Hospice could provide.    The patient mentioned specifically wanting a rolling walker for home use.  HCPOA:  Wife is the primary Media planner.    SUMMARY OF RECOMMENDATIONS   DNR / DNI Home with Hospice Services Equipment:  Rolling walker, wheel chair.  Patient needs to think about a hospital bed. Pain:  D/C tramadol and switch to Oxycodone IR - adding tylenol if needed, add heating pad Constipation:  Add Senna/S at bedtime each night.  Palliative Prophylaxis:   Bowel Regimen and Frequent Pain Assessment  Additional Recommendations (Limitations, Scope, Preferences):  Minimize Medications  Psycho-social/Spiritual:   Desire for further Chaplaincy support:yes  Additional Recommendations: Caregiving  Support/Resources and Education on Hospice  Prognosis:   < 6 months given DHF, Pulm HTN, valvular disease, decreased appetite, decreased mobility.  Discharge Planning: Home with Hospice      Primary Diagnoses: Present on Admission: . Acute on chronic diastolic  heart failure ([place]) . Acute on chronic diastolic CHF (congestive heart failure), NYHA class 4 ([place]) . CAD (coronary artery disease)   I have reviewed the medical record, interviewed the patient and family, and examined the patient. The following aspects are pertinent.  Past Medical History:  Diagnosis Date  . Anxiety   . Aortic stenosis    a. 04/2012 s/p baloon valvuloplasty w/ reduction in gradient from 47 to 27.  . Arthritis   . CAD (coronary artery disease) 11/05/1996   a. CABG x6 by Dr Redmond Pulling - LIMA to Diag+LAD, SVG to OM1+OM2, SVG to PDA+RPL, b. VG->RCA known to be occluded;  c. 05/2012 PCI/Rota/BMS to m/dRCA w/ 3.5x15 Vision BMS x 2 post-dil to 3.75.  Marland Kitchen Carotid artery disease ([place])   . Cellulitis and abscess of leg, except foot   . Chills   . Chronic diastolic congestive heart failure ([place])     a. 04/2012 Echo: EF 55-60-%  . Cough   . Cramps, muscle, general   . Diabetes mellitus   . Fracture of humerus, proximal, right, closed 2016  . Generalized headaches   . GERD (gastroesophageal reflux disease)   . Hearing loss   . Hiatal hernia   . Hyperlipidemia   . Hypertension   . Leg edema   . Low back pain   . Mitral stenosis    a. moderate by echo 04/2012.  Marland Kitchen Nasal congestion   . On home oxygen therapy    "2L 4PM til 8:30AM qd" (02/10/2015)  . Pulmonary hypertension ([place])    a. 05/2012 RH cath: PA 97/32(54)  . Right knee sprain   . 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   75m Edwards Sapien XT transcatheter heart valve placed via open right transfemoral approach  . Shortness of breath   . Sleep apnea    no cpap  sleep study >5 yrs  . Venous insufficiency (chronic) (peripheral)   . Wheezing    Social History   Social History  . Marital status: Married    Spouse name: N/A  . Number of children: N/A  . Years of education: N/A   Social History Main Topics  . Smoking status: Never Smoker  . Smokeless tobacco: Never Used  . Alcohol use No  . Drug use: No  . Sexual activity: Not Asked   Other Topics Concern  . None   Social History Narrative  . None   Family History  Problem Relation Age of Onset  . Emphysema Mother   . Diabetes Father   . Heart disease Father   . Diabetes Son   . Other Son     HEALTHY   Scheduled Meds: . ALPRAZolam  0.25 mg Oral Daily  . aspirin EC  81 mg Oral Daily  . clopidogrel  75 mg Oral Q breakfast  . colchicine  0.6 mg Oral Daily  . enoxaparin (LOVENOX) injection  30 mg Subcutaneous Daily  . famotidine  20 mg Oral Daily  . feeding supplement (GLUCERNA SHAKE)  237 mL Oral BID BM  . furosemide  80 mg Intravenous BID  . insulin aspart  0-15 Units Subcutaneous TID WC  . insulin aspart  10 Units Subcutaneous TID WC  . levofloxacin  500 mg Oral Q48H  . linagliptin  5 mg Oral Daily  . pantoprazole  40 mg  Oral q morning - 10a  . potassium chloride  40 mEq Oral BID  . repaglinide  2 mg Oral TID AC  . rosuvastatin  20 mg Oral QHS  . senna-docusate  2 tablet Oral QHS  . sertraline  100 mg Oral Daily  . sodium chloride flush  3 mL Intravenous Q12H   Continuous Infusions:  PRN Meds:.sodium chloride, acetaminophen, methocarbamol, nitroGLYCERIN, ondansetron (ZOFRAN) IV, oxyCODONE, sodium chloride flush, Zinc Oxide Medications Prior to Admission:  Prior to Admission medications   Medication Sig Start Date End Date Taking? Authorizing Provider  ALPRAZolam (XANAX) 0.25 MG tablet Take 1 tablet (0.25 mg total) by mouth 2 (two) times daily as needed for anxiety. 07/17/13  Yes [name], MD  aspirin EC 81 MG tablet Take 81 mg by mouth daily.   Yes Historical Provider, MD  clopidogrel (PLAVIX) 75 MG tablet Take 75 mg by mouth daily with breakfast.   Yes Historical Provider, MD  colchicine 0.6 MG tablet Take 1 tablet (0.6 mg total) by mouth daily as needed (for gout). 07/30/14  Yes Tonia Ghent, MD  ezetimibe (ZETIA) 10 MG tablet Take 10 mg by mouth daily.   Yes Historical Provider, MD  fish oil-omega-3 fatty acids 1000 MG capsule Take 1 g by mouth 2 (two) times daily.    Yes Historical Provider, MD  furosemide (LASIX) 80 MG tablet TAKE 2 TABLETS BY MOUTH IN THE MORNING AND 1 TABLET IN THE EVENING 03/28/15  Yes [name], MD  HUMALOG 100 UNIT/ML injection Inject 10 Units into the skin 3 (three) times daily. Baseline stays at 10 units. Adjust dose at needed based on sliding scale and bg readings 05/27/15  Yes Historical Provider, MD  JANUVIA 100 MG tablet Take 100 mg by mouth at bedtime.  04/24/12  Yes Historical Provider, MD  methocarbamol (ROBAXIN) 500 MG tablet Take 1 tablet (500 mg total) by mouth daily as needed (leg cramps). 08/08/15  Yes Tonia Ghent, MD  metolazone (ZAROXOLYN) 2.5 MG tablet TAKE 1 TABLET BY MOUTH EVERY OTHER DAY 09/08/15  Yes [name], MD  NITROSTAT 0.4 MG SL tablet  DISSOLVE 1 TABLET UNDER THE TONGUE FOR CHEST PAIN. MAY REPEAT EVERY 5MINUTES UP TO 3 DOSES. IF NO RELIEF, CALL 911** 01/31/15  Yes [name], MD  pantoprazole (PROTONIX) 40 MG tablet Take 1 tablet (40 mg total) by mouth every morning. 02/15/15  Yes [name], MD  polyethylene glycol powder (GLYCOLAX/MIRALAX) powder Take 17 g by mouth daily as needed (for constipation). 06/16/14  Yes Tonia Ghent, MD  potassium chloride SA (K-DUR,KLOR-CON) 20 MEQ tablet Take 3 tablets (60 mEq) by mouth with breakfast and dinner. Take 3 tablets (60 mEq) by mouth at bedtime. FOR A TOTAL OF 9 TABLETS DAILY.   Yes Historical Provider, MD  Powders (ANTI MONKEY BUTT) POWD Apply 1 application topically as needed (burning itching drying up). Apply to legs and rub in   Yes Historical Provider, MD  ranitidine (ZANTAC) 150 MG tablet Take 150 mg by mouth 2 (two) times daily.  04/11/12  Yes Historical Provider, MD  repaglinide (PRANDIN) 2 MG tablet Take 2 mg by mouth 3 (three) times daily before meals.  03/09/14  Yes Historical Provider, MD  sertraline (ZOLOFT) 100 MG tablet Take 1 tablet (100 mg total) by mouth daily. 07/04/15  Yes Tonia Ghent, MD  TOUJEO SOLOSTAR 300 UNIT/ML SOPN Inject 40 Units into the skin 3 (three) times daily with meals.  02/04/15  Yes Historical Provider, MD  traMADol (ULTRAM) 50 MG tablet TAKE 1-2 TABLETS BY MOUTH EVERY 12 HOURS AS NEEDED FOR SEVERE PAIN (MAX 4 PILLS PER DAY) 05/19/15  Yes  Tonia Ghent, MD  Vitamin D, Ergocalciferol, (DRISDOL) 50000 UNITS CAPS Take 50,000 Units by mouth every 30 (thirty) days. Take every month on saturday   Yes Historical Provider, MD   Allergies  Allergen Reactions  . Adhesive [Tape] Rash    blistered  . Celecoxib Rash   Review of Systems:  Back pain, Neck pain, leg and hand cramps, dyspnea, nocturnal dyspnea  Physical Exam  Well developed overweight male, HOH.  (Hears better on the left) Head remains bent forward. CV hard to hear but regular Resp on  oxygen.  No increased work of breathing.  Decreased breath sounds bilaterally Extremities:  LE wrapped tight.  Feet appear swollen. Able to walk two steps with the RW then has to rest before taking two more steps.   Vital Signs: BP 114/64 (BP Location: Left Arm)   Pulse 78   Temp 98.1 F (36.7 C) (Oral)   Resp 18   Ht 5' 10"  (1.778 m)   Wt 104.4 kg (230 lb 3.2 oz)   SpO2 95%   BMI 33.03 kg/m  Pain Assessment: No/denies pain   Pain Score: 0-No pain   SpO2: SpO2: 95 % O2 Device:SpO2: 95 % O2 Flow Rate: .O2 Flow Rate (L/min): 2 L/min  IO: Intake/output summary:  Intake/Output Summary (Last 24 hours) at 10/04/15 0950 Last data filed at 10/04/15 0945  Gross per 24 hour  Intake              700 ml  Output             1600 ml  Net             -900 ml    LBM: Last BM Date: 09/29/15 Baseline Weight: Weight: 103.2 kg (227 lb 8 oz) Most recent weight: Weight: 104.4 kg (230 lb 3.2 oz)     Palliative Assessment/Data:   Flowsheet Rows   Flowsheet Row Most Recent Value  Intake Tab  Referral Department  Cardiology  Unit at Time of Referral  Cardiac/Telemetry Unit  Palliative Care Primary Diagnosis  Cardiac  Date Notified  10/03/15  Palliative Care Type  New Palliative care  Reason for referral  Counsel Regarding Hospice, Non-pain Symptom, Psychosocial or Spiritual support, Clarify Goals of Care  Date of Admission  09/27/15  Date first seen by Palliative Care  10/04/15  # of days Palliative referral response time  1 Day(s)  # of days IP prior to Palliative referral  6  Clinical Assessment  Pain Max last 24 hours  6  Pain Min Last 24 hours  3  Dyspnea Max Last 24 Hours  6  Dyspnea Min Last 24 hours  3  Psychosocial & Spiritual Assessment  Palliative Care Outcomes  Patient/Family meeting held?  Yes  Who was at the meeting?  wife and patient  Palliative Care Outcomes  Improved non-pain symptom therapy, Counseled regarding hospice, Improved pain interventions  Palliative Care  follow-up planned  Yes, Home      Time In: 8:45 Time Out: 10:00 Time Total: 75 min. Greater than 50%  of this time was spent counseling and coordinating care related to the above assessment and plan.  Signed by: Imogene Burn, PA-C Palliative Medicine Pager: [phone]   Please contact Palliative Medicine Team phone at [phone] for questions and concerns.  For individual provider: See Shea Evans

## 2015-10-04 NOTE — Progress Notes (Signed)
Onward to meet with the patient and his spouse tomorrow at 10 am to discuss home hospice plans / finalize arrangements; Aneta Mins [phone]

## 2015-10-04 NOTE — Progress Notes (Signed)
   10/04/15 1126  Clinical Encounter Type  Visited With Patient and family together;Health care provider  Visit Type Initial;Spiritual support  Referral From Palliative care team  Spiritual Encounters  Spiritual Needs Prayer;Emotional  Stress Factors  Family Stress Factors Health changes   Chaplain responded to a consult from Palliative Care. Chaplain met with patient and patient's wife. Chaplain facilitated a life review, offered support, and prayer. Chaplain introduced spiritual care services. Spiritual care services available as needed.   Jeri Lager, Chaplain 10/04/15 11:27 AM

## 2015-10-04 NOTE — Progress Notes (Signed)
PT Cancellation Note  Patient Details Name: [name] [name]. MRN: [id number] DOB: 04/04/1940   Cancelled Treatment:    Reason Eval/Treat Not Completed:   @ 10:15 pt/wife reported he had been OOB x 2 already this morning (to use the bathroom) and requested PT return later @ 4:15 wife not present and pt sleeping soundly   Noted plans to discharge home with Hospice Services. Will further assess mobility 8/2, however ? If patient will need ambulance transport on discharge to home.    Kadia Abaya 10/04/2015, 4:16 PM  Pager [phone]

## 2015-10-04 NOTE — Progress Notes (Signed)
Patient Name: [name] [name]. Date of Encounter: 10/04/2015  Hospital Problem List     Active Problems:   Aortic stenosis   CAD (coronary artery disease)   Acute on chronic diastolic heart failure (HCC)   Acute on chronic diastolic CHF (congestive heart failure), NYHA class 4 (HCC)    Subjective   Sitting up on the side of the bed. Appears more alert and oriented today. Wife states she feels he has much improved since yesterday.   Inpatient Medications    . ALPRAZolam  0.25 mg Oral Daily  . aspirin EC  81 mg Oral Daily  . clopidogrel  75 mg Oral Q breakfast  . colchicine  0.6 mg Oral Daily  . enoxaparin (LOVENOX) injection  30 mg Subcutaneous Daily  . famotidine  20 mg Oral Daily  . feeding supplement (GLUCERNA SHAKE)  237 mL Oral BID BM  . furosemide  80 mg Intravenous BID  . insulin aspart  0-15 Units Subcutaneous TID WC  . insulin aspart  10 Units Subcutaneous TID WC  . levofloxacin  500 mg Oral Daily  . linagliptin  5 mg Oral Daily  . pantoprazole  40 mg Oral q morning - 10a  . potassium chloride  40 mEq Oral BID  . repaglinide  2 mg Oral TID AC  . rosuvastatin  20 mg Oral QHS  . sertraline  100 mg Oral Daily  . sodium chloride flush  3 mL Intravenous Q12H    Vital Signs    Vitals:   10/03/15 0623 10/03/15 1131 10/03/15 2100 10/04/15 0511  BP: 118/62 116/64 115/65 114/64  Pulse: 68 72 65 78  Resp: 17 18 18 18   Temp: 97.2 F (36.2 C) 98.2 F (36.8 C) 98 F (36.7 C) 98.1 F (36.7 C)  TempSrc: Oral Oral Oral Oral  SpO2: 96% 98% 100% 95%  Weight:    230 lb 3.2 oz (104.4 kg)  Height:        Intake/Output Summary (Last 24 hours) at 10/04/15 0829 Last data filed at 10/04/15 0300  Gross per 24 hour  Intake              360 ml  Output             1600 ml  Net            -1240 ml   Filed Weights   10/02/15 0542 10/03/15 0210 10/04/15 0511  Weight: 227 lb 9.6 oz (103.2 kg) 229 lb 4.8 oz (104 kg) 230 lb 3.2 oz (104.4 kg)    Physical Exam    General:  Pleasant older male, NAD. Wearing [place] Neuro: Alert and oriented X 3. Moves all extremities spontaneously. Psych: Normal affect. HEENT:  Normal  Neck: Supple without bruits , + JVD. Lungs:  Resp regular and unlabored, Diminished in lower lobes. Heart: RRR no s3, s4, 2/6 systolic murmur. Abdomen: Soft, non-tender, non-distended, BS + x 4.  Extremities: No clubbing, cyanosis, 1+ bilateral lower extremity edema. DP/PT/Radials 2+ and equal bilaterally. Lower extremities wrapped.  Labs    CBC No results for input(s): WBC, NEUTROABS, HGB, HCT, MCV, PLT in the last 72 hours. Basic Metabolic Panel  Recent Labs  10/03/15 0320 10/04/15 0438  NA 133* 132*  K 4.0 3.2*  CL 92* 91*  CO2 29 30  GLUCOSE 184* 150*  BUN 79* 80*  CREATININE 2.75* 2.63*  CALCIUM 8.9 9.0   Liver Function Tests No results for input(s): AST, ALT, ALKPHOS, BILITOT,  PROT, ALBUMIN in the last 72 hours. No results for input(s): LIPASE, AMYLASE in the last 72 hours. Cardiac Enzymes No results for input(s): CKTOTAL, CKMB, CKMBINDEX, TROPONINI in the last 72 hours. BNP Invalid input(s): POCBNP D-Dimer No results for input(s): DDIMER in the last 72 hours. Hemoglobin A1C No results for input(s): HGBA1C in the last 72 hours. Fasting Lipid Panel No results for input(s): CHOL, HDL, LDLCALC, TRIG, CHOLHDL, LDLDIRECT in the last 72 hours. Thyroid Function Tests No results for input(s): TSH, T4TOTAL, T3FREE, THYROIDAB in the last 72 hours.  Invalid input(s): [place]  Telemetry    SR  ECG    None  Radiology    10/03/2015  EXAM: CHEST  2 VIEW  COMPARISON:  09/28/2015  FINDINGS: Changes from cardiac surgery and aortic valve replacement are stable. Cardiac silhouette is normal in size. No mediastinal or hilar masses or convincing adenopathy.  There is central vascular congestion. Mild bilateral interstitial thickening that is greatest the lower lungs. Additional opacity is noted in both posterior lower  lobes. This latter finding is likely atelectasis. Pneumonia is possible. Next item no significant pleural effusion. No pneumothorax.  Bony thorax is demineralized but grossly intact.  IMPRESSION: 1. Increased basilar opacity when compared the prior study likely atelectasis. 2. Vascular congestion interstitial thickening is similar to the most recent prior exam allowing for lower lung volumes on the current study. Mild congestive heart failure is suspected.  Assessment & Plan    75 year old male with a past medical history of CAD s/p CABG, chronic diastolic CHF, CKD stage III, carotid artery disease, mitral stenosis, AS s/p TAVR in 2015, and DM. He presented to the office on 09/27/15 with SOB and weakness. Admitted with volume overload.  1. Acute on Chronic diastolic heart failure: His weight has started to increase despite good diuresis this admission with total UOP of 8L.  Morning lasix was held yesterday. Cr with slight improvement today. Will continue IV lasix for now. Repeat BNP yesterday with slight improvement from 7 days ago.  2. Acute on chronic renal insuff: Cr continues to rise 2.6>>2.75>>2.63 yesterday. Slight improvement today. Will continue with IV lasix.   3. HTN heart disease: Blood pressure is stable.  4. DM II: SSI  5. CAP: Chest x-ray yesterday reporting concern for PNA. Will start on Levaquin 500mg  daily for 5 days.   5. Hypokalemia: Will give 69meq of K along with scheduled dose  6.  CAD s/p CABG with no angina.  Continue ASA/Plavx/statin.  Signed, Reino Bellis NP-C Pager [phone]  Patient seen and independently examined with Reino Bellis, NP. We discussed all aspects of the encounter. I agree with the assessment and plan as stated above with some modifications.  Patient seems improved today and more alert. Appreciate Palliative Care input.  Patient wants to go home with Hospice.  Renal function stable on current diuretics.  Chest xray from yesterday  with persistence of edema and BNP still elevated.  Will try to diurese further with IV lasix today and reassess tomorrow.  Replete potassium.  Cxray yesterday with ? Atelectasis vs. PNA.  He is afebrile.  Will check CBC today and treat empirically for now with a course of antibx with Levaquin.  Signed: Fransico Him, MD [facility] 10/04/2015

## 2015-10-04 NOTE — Clinical Social Work Note (Signed)
CSW acknowledges consult "Please offer Hospice services at home. Patient is in the [place] / [place] area. May need Residential Hospice Home at some point so [facility] may be best." Will notify RNCM.  Dayton Scrape, [place]

## 2015-10-05 ENCOUNTER — Telehealth: Payer: Self-pay

## 2015-10-05 ENCOUNTER — Ambulatory Visit: Payer: Medicare Other | Admitting: Cardiovascular Disease

## 2015-10-05 DIAGNOSIS — Z79899 Other long term (current) drug therapy: Secondary | ICD-10-CM

## 2015-10-05 DIAGNOSIS — R0602 Shortness of breath: Secondary | ICD-10-CM

## 2015-10-05 LAB — GLUCOSE, CAPILLARY
GLUCOSE-CAPILLARY: 137 mg/dL — AB (ref 65–99)
GLUCOSE-CAPILLARY: 116 mg/dL — AB (ref 65–99)
GLUCOSE-CAPILLARY: 194 mg/dL — AB (ref 65–99)
GLUCOSE-CAPILLARY: 181 mg/dL — AB (ref 65–99)
Glucose-Capillary: 199 mg/dL — ABNORMAL HIGH (ref 65–99)

## 2015-10-05 LAB — BASIC METABOLIC PANEL
ANION GAP: 13 (ref 5–15)
BUN: 74 mg/dL — ABNORMAL HIGH (ref 6–20)
CO2: 30 mmol/L (ref 22–32)
Calcium: 8.9 mg/dL (ref 8.9–10.3)
Chloride: 91 mmol/L — ABNORMAL LOW (ref 101–111)
Creatinine, Ser: 2.46 mg/dL — ABNORMAL HIGH (ref 0.61–1.24)
GFR, EST AFRICAN AMERICAN: 28 mL/min — AB (ref >60)
GFR, EST NON AFRICAN AMERICAN: 24 mL/min — AB (ref >60)
Glucose, Bld: 135 mg/dL — ABNORMAL HIGH (ref 65–99)
POTASSIUM: 3.1 mmol/L — AB (ref 3.5–5.1)
SODIUM: 134 mmol/L — AB (ref 135–145)

## 2015-10-05 MED ORDER — POTASSIUM CHLORIDE CRYS ER 20 MEQ PO TBCR
40.0000 meq | EXTENDED_RELEASE_TABLET | Freq: Once | ORAL | Status: AC
  Administered 2015-10-05: 40 meq via ORAL
  Filled 2015-10-05: qty 2

## 2015-10-05 MED ORDER — POTASSIUM CHLORIDE CRYS ER 20 MEQ PO TBCR
60.0000 meq | EXTENDED_RELEASE_TABLET | Freq: Two times a day (BID) | ORAL | Status: DC
  Administered 2015-10-06 – 2015-10-09 (×7): 60 meq via ORAL
  Filled 2015-10-05 (×6): qty 3
  Filled 2015-10-05: qty 6

## 2015-10-05 MED ORDER — OXYCODONE HCL 5 MG PO TABS
5.0000 mg | ORAL_TABLET | ORAL | Status: DC | PRN
  Administered 2015-10-07 – 2015-10-12 (×8): 5 mg via ORAL
  Filled 2015-10-05 (×8): qty 1

## 2015-10-05 NOTE — Progress Notes (Signed)
Daily Progress Note   Patient Name: [name] [name].       Date: 10/05/2015 DOB: [birth year]  Age: 75 y.o. MRN#: [id number] Attending Physician: [name], MD Primary Care Physician: [name], MD Admit Date: 09/27/2015  Reason for Consultation/Follow-up: Pain control  Subjective: I met with the patient and his wife at bedside.  The patient said he felt better.  He complained that the oxycodone did not help him sleep the way his tramadol normally does.  Bowel movements are doing well.  Heating pad helped his back and neck.  Family mentions they want [facility].  Per the family they have an excellent reputation and an excellent end of life facility.  [name] [name] states - "that's what I want at the end of my life, to go there (to hospice house) and be comfortable."  Length of Stay: 8  Current Medications: Scheduled Meds:  . ALPRAZolam  0.25 mg Oral Daily  . aspirin EC  81 mg Oral Daily  . clopidogrel  75 mg Oral Q breakfast  . colchicine  0.6 mg Oral Daily  . enoxaparin (LOVENOX) injection  30 mg Subcutaneous Daily  . famotidine  20 mg Oral Daily  . feeding supplement (GLUCERNA SHAKE)  237 mL Oral BID BM  . furosemide  80 mg Intravenous BID  . insulin aspart  0-15 Units Subcutaneous TID WC  . insulin aspart  10 Units Subcutaneous TID WC  . levofloxacin  500 mg Oral Q48H  . linagliptin  5 mg Oral Daily  . pantoprazole  40 mg Oral q morning - 10a  . potassium chloride  40 mEq Oral BID  . [START ON 10/06/2015] potassium chloride  60 mEq Oral BID  . repaglinide  2 mg Oral TID AC  . rosuvastatin  20 mg Oral QHS  . senna-docusate  2 tablet Oral QHS  . sertraline  100 mg Oral Daily  . sodium chloride flush  3 mL Intravenous Q12H    Continuous Infusions:    PRN  Meds: sodium chloride, acetaminophen, methocarbamol, nitroGLYCERIN, ondansetron (ZOFRAN) IV, oxyCODONE, sodium chloride flush, Zinc Oxide  Physical Exam  Well developed, elderly male, with head looking towards the floor.  Sitting on the side of the bed. CV irreg Resp nad Abdomen obese Extremities LE are wrapped but significant edema is  evidenced at the knees.         Vital Signs: BP 124/62 (BP Location: Right Arm)   Pulse 81   Temp 97.6 F (36.4 C) (Oral)   Resp 18   Ht '5\' 10"'$  (1.778 m)   Wt 102.1 kg (225 lb 1.6 oz) Comment: scale c  SpO2 100%   BMI 32.30 kg/m  SpO2: SpO2: 100 % O2 Device: O2 Device: Nasal Cannula O2 Flow Rate: O2 Flow Rate (L/min): 4 L/min  Intake/output summary:  Intake/Output Summary (Last 24 hours) at 10/05/15 1115 Last data filed at 10/05/15 0427  Gross per 24 hour  Intake              220 ml  Output             1950 ml  Net            -1730 ml   LBM: Last BM Date: 10/05/15 Baseline Weight: Weight: 103.2 kg (227 lb 8 oz) Most recent weight: Weight: 102.1 kg (225 lb 1.6 oz) (scale c)       Palliative Assessment/Data:    Flowsheet Rows   Flowsheet Row Most Recent Value  Intake Tab  Referral Department  Cardiology  Unit at Time of Referral  Cardiac/Telemetry Unit  Palliative Care Primary Diagnosis  Cardiac  Date Notified  10/03/15  Palliative Care Type  New Palliative care  Reason for referral  Counsel Regarding Hospice, Non-pain Symptom, Psychosocial or Spiritual support, Clarify Goals of Care  Date of Admission  09/27/15  Date first seen by Palliative Care  10/04/15  # of days Palliative referral response time  1 Day(s)  # of days IP prior to Palliative referral  6  Clinical Assessment  Pain Max last 24 hours  6  Pain Min Last 24 hours  3  Dyspnea Max Last 24 Hours  6  Dyspnea Min Last 24 hours  3  Psychosocial & Spiritual Assessment  Palliative Care Outcomes  Patient/Family meeting held?  Yes  Who was at the meeting?  wife and  patient  Palliative Care Outcomes  Improved non-pain symptom therapy, Counseled regarding hospice, Improved pain interventions  Palliative Care follow-up planned  Yes, Home      Patient Active Problem List   Diagnosis Date Noted  . Palliative care encounter   . Goals of care, counseling/discussion   . Encounter for hospice care discussion   . Acute on chronic diastolic CHF (congestive heart failure), NYHA class 4 ([place]) 09/27/2015  . Leg weakness 09/13/2015  . 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 Acute on chronic diastolic congestive heart failure, NYHA class 4 ([place]) 02/10/2015  . Unstable angina ([place]) 02/10/2015  . Infected skin tear 02/01/2015  . Inflamed toenail 02/01/2015  . Gout 08/02/2014  . Insomnia 06/17/2014  . Constipation 06/17/2014  . Fracture of humerus, proximal, right, closed 04/29/2014  . Right shoulder injury 04/07/2014  . Depression 02/14/2014  . Pain in joint, shoulder region 02/14/2014  . 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  . Aortic stenosis, severe 06/16/2013  . Acute on chronic diastolic heart failure ([place]) 03/19/2013  . Hx of CABG 03/09/2013  . Rheumatic heart disease 03/09/2013  . Severe aortic stenosis 10/13/2012  . SCC (squamous cell carcinoma) 08/15/2012  . Skin lesion 07/09/2012  . Leg cramp 07/09/2012  . S/P balloon aortic valvuloplasty 04/29/2012  . AS (aortic stenosis) 04/07/2012  . CAD (coronary artery disease) 04/07/2012  . Mitral stenosis 03/10/2012  . Aortic stenosis 03/10/2012  .  Chronic diastolic congestive heart failure ([place]) 03/10/2012  . Aortic valve disorders 03/10/2012  . Pulmonary hypertension ([place]) 03/10/2012  . Venous insufficiency (chronic) (peripheral) 03/10/2012  . SOB (shortness of breath) 01/23/2012  . History of vitrectomy 08/09/2011  . History of surgical procedure 03/28/2011  . Edema leg 03/09/2011  . Encounter for observation for other suspected diseases and conditions ruled out 03/09/2011  .  Essential (primary) hypertension 03/09/2011  . Personal history of other diseases of the digestive system 03/09/2011  . Type 2 diabetes mellitus treated with insulin ([place]) 03/09/2011  . Obstructive apnea 03/09/2011  . Neuroendocrine tumor 01/09/2011  . Cough 01/05/2011  . Appendicitis 12/25/2010  . AK (actinic keratosis) 12/25/2010  . Dupuytren contracture 12/25/2010  . Cataract 12/14/2010  . Diabetes mellitus ([place]) 12/14/2010  . Vitreous hemorrhage ([place]) 12/14/2010  . CAROTID BRUIT 07/15/2009  . OBESITY 04/15/2009  . DIARRHEA 02/09/2009  . GASTROENTERITIS 01/25/2009  . HYPERPOTASSEMIA 10/18/2008  . AORTIC & MITRAL STENOSIS W/ INSUFFI, RHEUM/NON-RHEUM 10/15/2008  . ATHEROSCLEROSIS W/ ULCERATION 10/15/2008  . Atherosclerotic peripheral vascular disease with ulceration ([place]) 10/15/2008  . CAD, ARTERY BYPASS GRAFT 06/24/2008  . CAROTID ARTERY DISEASE 06/24/2008  . SLEEP APNEA 06/24/2008  . CELLULITIS AND ABSCESS OF LEG EXCEPT FOOT 01/16/2008  . EDEMA LEG 01/16/2008  . KNEE SPRAIN, RIGHT 07/21/2007  . MUSCLE CRAMPS 06/06/2007  . Type 2 diabetes mellitus with neurologic complication ([place]) 76/54/6503  . HYPERLIPIDEMIA 01/23/2007  . HYPERTENSION 01/23/2007  . GERD 01/23/2007  . HIATAL HERNIA 01/23/2007  . LOW BACK PAIN, CHRONIC 01/23/2007  . FOREIGN BODY, FINGER W/O INFECTION 08/20/2006  . S/P CABG x 6 11/05/1996    Palliative Care Assessment & Plan   Patient Profile: 75 y.o. male  with past medical history of diastolic heart failure, TAVR, calcified mitral valve, pulmonary HTN, CAD s/p CABG x 6v and CKD who was admitted on 09/27/2015 with leg and abdominal swelling as well as SOB. He was diuresed and is currently down 8L this admission.  He is improved, but is eating very little and still having difficulty with SOB and ambulation.  His creatinine has risen from a base line of 2 in march to approximately 2.6.   CXR from 7/31 shows increased basilar opacity and their is concern for  pneumonia.  He has been started on Levaquin.    SUMMARY OF RECOMMENDATIONS   DNR / DNI Home with Hospice Services - patient chooses [facility]. Equipment:  Rolling walker, wheel chair.  Patient needs to think about a hospital bed. Pain:  D/C tramadol and switch to Oxycodone IR - adding tylenol if needed, add heating pad Constipation:  Add Senna/S at bedtime each night.  Goals of Care and Additional Recommendations:  Given a focus on comfort it is worth considering the elimination of medications that are not focused on comfort - for example:  crestor and aspirin - will defer this to the primary attending team.  Code Status: DNR  Prognosis:  Less than 6 months in the setting of valvular stiffening, HF and worsening kidney function  Discharge Planning:  Home with Hospice  Care plan was discussed with patient, wife and case manager  Thank you for allowing the Palliative Medicine Team to assist in the care of this patient.   Time In: 930 Time Out: 9:55 Total Time 25 Prolonged Time Billed no      Greater than 50%  of this time was spent counseling and coordinating care related to the above assessment and plan.  Imogene Burn, PA-C Palliative Medicine Pager: [phone]  Please contact Palliative Medicine Team phone at [phone] for questions and concerns.

## 2015-10-05 NOTE — Telephone Encounter (Signed)
Yes, I'll attend, please notify them.  I thank all involved.

## 2015-10-05 NOTE — Consult Note (Addendum)
   [facility] CM Inpatient Consult   10/05/2015  [name] [name] 1940-07-24 [id number]   Patient screened for potential [facility] services. Patient is eligible for [facility] Care Management services under patient's [place] Medicare  plan. Chart review reveals patient will be followed by [facility] per inpatient RNCM note.  Patient will receive full care management services in their program and [facility] Care Management will not be appropriate for services at this time.   Please call for questions contact:   Natividad Brood, RN BSN [facility] Liaison  [phone] business mobile phone Toll free office [phone]  Also, noted that inpatient has arranged for EMMI HF calls for follow up.

## 2015-10-05 NOTE — Progress Notes (Signed)
Nutrition Follow-up   INTERVENTION:  Continue Glucerna Shake po BID, each supplement provides 220 kcal and 10 grams of protein   NUTRITION DIAGNOSIS:   Inadequate oral intake related to poor appetite as evidenced by per patient/family report, meal completion < 50%.  Ongoing  GOAL:   Patient will meet greater than or equal to 90% of their needs  Being Met  MONITOR:   PO intake, Supplement acceptance, Labs, Weight trends, Skin, I & O's  REASON FOR ASSESSMENT:   Malnutrition Screening Tool    ASSESSMENT:   75 y.o. male with past medical history of CHF, T2DM, and CABG presents to the office for evaluation of progressive edema and shortness of breath.  [name] [name] eating lunch slowly at time of visit. He states that his appetite continues to be poor and complains that he is being sent food that he doesn't like. RD encouraged [name] [name] to use menu to order meals and assisted patient in ordering dinner. Per nursing notes [name] [name] has been eating 50-100% of meals. [name] [name] states that he likes the [employer] and has been drinking them. His weight has been stable fairly. Per chart, [name] [name]/family have met with palliative care with plans for home hospice.   Labs: low potassium, low chloride, elevated BUN/Creatinine  Diet Order:  Diet Carb Modified Fluid consistency: Thin; Room service appropriate? Yes  Skin:  Reviewed, no issues  Last BM:  8/2  Height:   Ht Readings from Last 1 Encounters:  09/27/15 _0  (1.778 m)    Weight:   Wt Readings from Last 1 Encounters:  10/05/15 225 lb 1.6 oz (102.1 kg)    Ideal Body Weight:  75.5 kg  BMI:  Body mass index is 32.3 kg/m.  Estimated Nutritional Needs:   Kcal:  1900-2100  Protein:  90-100 grams  Fluid:  per MD  EDUCATION NEEDS:   No education needs identified at this time  [place], LDN Inpatient Clinical Dietitian Pager: [phone] After Hours Pager: [phone]

## 2015-10-05 NOTE — Progress Notes (Signed)
CM talked to pt/ spouse again about home hospice care; They have changed their mind after talking to some friends/ family member and now they would like to have [facility]; Referral made as requested; fax # [phone]; Mindi Slicker [facility] [phone]

## 2015-10-05 NOTE — Telephone Encounter (Signed)
Katharine Look with Hospice notified as instructed by telephone and verbalized understanding.

## 2015-10-05 NOTE — Progress Notes (Signed)
Patient Name: [name] [name]. Date of Encounter: 10/05/2015  Hospital Problem List     Active Problems:   Aortic stenosis   CAD (coronary artery disease)   Acute on chronic diastolic heart failure (HCC)   Acute on chronic diastolic CHF (congestive heart failure), NYHA class 4 ([place])   Palliative care encounter   Goals of care, counseling/discussion   Encounter for hospice care discussion    Subjective   Sitting up on the side of the bed eating breakfast. Alert and interactive. Breathing has continued to improve.  Inpatient Medications    . ALPRAZolam  0.25 mg Oral Daily  . aspirin EC  81 mg Oral Daily  . clopidogrel  75 mg Oral Q breakfast  . colchicine  0.6 mg Oral Daily  . enoxaparin (LOVENOX) injection  30 mg Subcutaneous Daily  . famotidine  20 mg Oral Daily  . feeding supplement (GLUCERNA SHAKE)  237 mL Oral BID BM  . furosemide  80 mg Intravenous BID  . insulin aspart  0-15 Units Subcutaneous TID WC  . insulin aspart  10 Units Subcutaneous TID WC  . levofloxacin  500 mg Oral Q48H  . linagliptin  5 mg Oral Daily  . pantoprazole  40 mg Oral q morning - 10a  . potassium chloride  40 mEq Oral BID  . potassium chloride  40 mEq Oral Once  . repaglinide  2 mg Oral TID AC  . rosuvastatin  20 mg Oral QHS  . senna-docusate  2 tablet Oral QHS  . sertraline  100 mg Oral Daily  . sodium chloride flush  3 mL Intravenous Q12H    Vital Signs    Vitals:   10/04/15 0511 10/04/15 1308 10/04/15 2029 10/05/15 0426  BP: 114/64 119/67 (!) 107/56 124/62  Pulse: 78 73 75 81  Resp: 18 18 16 18   Temp: 98.1 F (36.7 C) 98.2 F (36.8 C) 98.2 F (36.8 C) 97.6 F (36.4 C)  TempSrc: Oral Oral Oral Oral  SpO2: 95% 100% 99% 100%  Weight: 230 lb 3.2 oz (104.4 kg)   225 lb 1.6 oz (102.1 kg)  Height:        Intake/Output Summary (Last 24 hours) at 10/05/15 0818 Last data filed at 10/05/15 0427  Gross per 24 hour  Intake              680 ml  Output             1950 ml  Net             -1270 ml   Filed Weights   10/03/15 0210 10/04/15 0511 10/05/15 0426  Weight: 229 lb 4.8 oz (104 kg) 230 lb 3.2 oz (104.4 kg) 225 lb 1.6 oz (102.1 kg)    Physical Exam    General: Pleasant older male, NAD. Wearing [place] Neuro: Alert and oriented X 3. Moves all extremities spontaneously. Psych: Normal affect. HEENT:  Normal  Neck: Supple without bruits , + JVD. Lungs:  Resp regular and unlabored, Diminished in lower lobes. Heart: RRR no s3, s4, 2/6 systolic murmur. Abdomen: Soft, non-tender, non-distended, BS + x 4.  Extremities: No clubbing, cyanosis, 1+ bilateral extremity edema noted at the knees. DP/PT/Radials 2+ and equal bilaterally. Lower extremities wrapped.  Labs    CBC  Recent Labs  10/04/15 1144  WBC 7.6  NEUTROABS 5.9  HGB 8.6*  HCT 31.5*  MCV 67.3*  PLT 99991111   Basic Metabolic Panel  Recent Labs  10/04/15  [id number] 10/05/15 0503  NA 132* 134*  K 3.2* 3.1*  CL 91* 91*  CO2 30 30  GLUCOSE 150* 135*  BUN 80* 74*  CREATININE 2.63* 2.46*  CALCIUM 9.0 8.9   Liver Function Tests No results for input(s): AST, ALT, ALKPHOS, BILITOT, PROT, ALBUMIN in the last 72 hours. No results for input(s): LIPASE, AMYLASE in the last 72 hours. Cardiac Enzymes No results for input(s): CKTOTAL, CKMB, CKMBINDEX, TROPONINI in the last 72 hours. BNP Invalid input(s): POCBNP D-Dimer No results for input(s): DDIMER in the last 72 hours. Hemoglobin A1C No results for input(s): HGBA1C in the last 72 hours. Fasting Lipid Panel No results for input(s): CHOL, HDL, LDLCALC, TRIG, CHOLHDL, LDLDIRECT in the last 72 hours. Thyroid Function Tests No results for input(s): TSH, T4TOTAL, T3FREE, THYROIDAB in the last 72 hours.  Invalid input(s): FREET3  Telemetry    SR  ECG    None  Radiology     Assessment & Plan    75 year old male with a past medical history of CAD s/p CABG, chronic diastolic CHF, CKD stage III, carotid artery disease, mitral stenosis, AS s/p TAVR in  2015, and DM. He presented to the office on 09/27/15 with SOB and weakness. Admitted with volume overload.  1. Acute on Chronic diastolic heart failure: Weight down 5lbs from yesterday? Continues to have good UOP 1.9L yesterday. Cr has actually improved today. Will continue with IV lasix as he still has pitting edema to his upper legs around the knees, above his Unna boots. Breathing has improved from previously in the week.   2. Acute on chronic renal insuff: Cr improved this morning. Will continue with IV lasix today. Follow BMET.  3. HTN heart disease: Blood pressure is stable.  4. DM II: SSI  5. CAP: Chest x-ray 7/31 reporting concern for PNA vs Atelectasis. Started on Levaquin 500mg  daily for 5 days. CBC with normal WBC at this time.   5. Hypokalemia: Will give 45meq of K along with scheduled dose. Will adjust to 90meq BID starting tomorrow as wife reports he has been on this dose at home, and has trouble keeping his K up.   6.  CAD s/p CABG with no angina.  Continue ASA/Plavx/statin.  Signed, Reino Bellis NP-C Pager [phone]

## 2015-10-05 NOTE — Telephone Encounter (Signed)
[name] [name] with [facility] left v/m; pt is presently at [facility] with estimated discharge on 10/07/15 Hilda Blades has faxed Hospice orders for Dr Josefine Class signature if Dr Damita Dunnings will be pts attending. Please advise.

## 2015-10-06 ENCOUNTER — Encounter: Payer: Self-pay | Admitting: Family Medicine

## 2015-10-06 DIAGNOSIS — Z515 Encounter for palliative care: Secondary | ICD-10-CM | POA: Insufficient documentation

## 2015-10-06 LAB — BASIC METABOLIC PANEL
Anion gap: 12 (ref 5–15)
BUN: 74 mg/dL — ABNORMAL HIGH (ref 6–20)
CHLORIDE: 93 mmol/L — AB (ref 101–111)
CO2: 29 mmol/L (ref 22–32)
Calcium: 8.9 mg/dL (ref 8.9–10.3)
Creatinine, Ser: 2.56 mg/dL — ABNORMAL HIGH (ref 0.61–1.24)
GFR, EST AFRICAN AMERICAN: 27 mL/min — AB (ref >60)
GFR, EST NON AFRICAN AMERICAN: 23 mL/min — AB (ref >60)
Glucose, Bld: 252 mg/dL — ABNORMAL HIGH (ref 65–99)
POTASSIUM: 3.5 mmol/L (ref 3.5–5.1)
SODIUM: 134 mmol/L — AB (ref 135–145)

## 2015-10-06 LAB — GLUCOSE, CAPILLARY
GLUCOSE-CAPILLARY: 260 mg/dL — AB (ref 65–99)
Glucose-Capillary: 218 mg/dL — ABNORMAL HIGH (ref 65–99)
Glucose-Capillary: 88 mg/dL (ref 65–99)
Glucose-Capillary: 75 mg/dL (ref 65–99)
Glucose-Capillary: 101 mg/dL — ABNORMAL HIGH (ref 65–99)

## 2015-10-06 MED ORDER — METOLAZONE 2.5 MG PO TABS
2.5000 mg | ORAL_TABLET | ORAL | Status: DC
  Administered 2015-10-06 – 2015-10-10 (×3): 2.5 mg via ORAL
  Filled 2015-10-06 (×3): qty 1

## 2015-10-06 MED ORDER — COLCHICINE 0.6 MG PO TABS: 0.3000 mg | ORAL_TABLET | Freq: Every day | ORAL | Status: DC | PRN

## 2015-10-06 MED ORDER — COLCHICINE 0.6 MG PO TABS: 0.3000 mg | ORAL_TABLET | Freq: Every day | ORAL | Status: DC

## 2015-10-06 MED ORDER — FUROSEMIDE 10 MG/ML IJ SOLN
80.0000 mg | Freq: Three times a day (TID) | INTRAMUSCULAR | Status: DC
  Administered 2015-10-06 – 2015-10-09 (×9): 80 mg via INTRAVENOUS
  Filled 2015-10-06 (×9): qty 8

## 2015-10-06 NOTE — Care Management Important Message (Signed)
Important Message  Patient Details  Name: [name] [name]. MRN: [id number] Date of Birth: March 22, 1940   Medicare Important Message Given:  Yes    Loann Quill 10/06/2015, 10:49 AM

## 2015-10-06 NOTE — Progress Notes (Signed)
New referral for hospice at home on 75yo gentleman who was admitted to [facility] on 7.25.17 post MD appointment for progressive edema and SOB.  Patients admitting Dx- diastolic heart failure, NYHA IV symptoms and CKD III/IV.  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   Patient is post palliative medicine consult to discuss goals of care.  Post PM discussion, Mr. Schmelzle would like to pursue hospice.  I met with Mr. Fitzmaurice and his wife Ardath Sax today to discuss hospice services.  Mr. Bon did not participate in the conversation.  He was sitting up in a chair upon my arrival with oxygen @ 4L in place via [place].  He request I speak with his wife, and fell back to sleep.  He would awake some during my visit, but was mildly confused and would drift off to sleep.  After discussion with Mrs. Cheatum- she would like to pursue with hospice in their home post hospital discharge.  I spoke with the MD who states she is unsure of discharge date.  She wants to try to get a little more fluid off before discharge home.  DME in the home:  Oxygen concentrator, 2 portable and 1 emergency tank (through [facility])-  They are fine with switching out to [facility].  Shower bench, Clinical research associate, transport wheelchair.  DME Needs:  over bed table, O2 concentrator with fillable tanks and humidifier, 3 in 1 BSC, front wheeled walker. She is unsure of a hospital bed at this time, however, I did encourage it because she states their bed is very tall- and you have to use a step to get in the bed.  She wanted to discuss it with Mr. Kowal and will let me know if they decide to get one.  Upon leaving, I woke Mr. Koontz up.  He woke up more and held a short conversation with me.  I helped him set up his food tray- and when I left, he was eating  independently.  Hospice packet given with numbers for Mrs. Woodberry to call if she had any questions or concerns.  Patient will need a portable DNR at time of discharge. Will continue to follow through final disposition. Thank you for allowing participation in this patient's care.  Dimas Aguas, RN Clinical Nurse Liaison Hospice and [facility] [place] [phone]

## 2015-10-06 NOTE — Progress Notes (Signed)
Santiago Glad RN with [facility] [phone]), in to talk to pt and spouse. Referral process complete for home hospice. CM will continue to follow for DCP; B Carl Butner RN,MHAmBSN [phone]

## 2015-10-06 NOTE — Progress Notes (Addendum)
Patient Name: [name] [name]. Date of Encounter: 10/06/2015  Hospital Problem List     Active Problems:   Aortic stenosis   CAD (coronary artery disease)   Acute on chronic diastolic heart failure (HCC)   Acute on chronic diastolic CHF (congestive heart failure), NYHA class 4 ([place])   Palliative care encounter   Goals of care, counseling/discussion   Encounter for hospice care discussion   Medication management    Subjective   In bed this morning. Alert and interactive. Breathing has continued to improve.   Inpatient Medications    . ALPRAZolam  0.25 mg Oral Daily  . aspirin EC  81 mg Oral Daily  . clopidogrel  75 mg Oral Q breakfast  . colchicine  0.6 mg Oral Daily  . enoxaparin (LOVENOX) injection  30 mg Subcutaneous Daily  . famotidine  20 mg Oral Daily  . feeding supplement (GLUCERNA SHAKE)  237 mL Oral BID BM  . furosemide  80 mg Intravenous BID  . insulin aspart  0-15 Units Subcutaneous TID WC  . insulin aspart  10 Units Subcutaneous TID WC  . levofloxacin  500 mg Oral Q48H  . linagliptin  5 mg Oral Daily  . pantoprazole  40 mg Oral q morning - 10a  . potassium chloride  60 mEq Oral BID  . repaglinide  2 mg Oral TID AC  . rosuvastatin  20 mg Oral QHS  . senna-docusate  2 tablet Oral QHS  . sertraline  100 mg Oral Daily  . sodium chloride flush  3 mL Intravenous Q12H    Vital Signs    Vitals:   10/05/15 0426 10/05/15 1227 10/05/15 1935 10/06/15 0420  BP: 124/62 (!) 95/57 (!) 119/57 123/66  Pulse: 81 75 70 82  Resp: 18 18 18 18   Temp: 97.6 F (36.4 C) 98.3 F (36.8 C) 98.5 F (36.9 C) 97.8 F (36.6 C)  TempSrc: Oral Oral Oral Oral  SpO2: 100% 94% 99% 99%  Weight: 225 lb 1.6 oz (102.1 kg)   226 lb 9.6 oz (102.8 kg)  Height:        Intake/Output Summary (Last 24 hours) at 10/06/15 0945 Last data filed at 10/06/15 0844  Gross per 24 hour  Intake              956 ml  Output             1101 ml  Net             -145 ml   Filed Weights   10/04/15  0511 10/05/15 0426 10/06/15 0420  Weight: 230 lb 3.2 oz (104.4 kg) 225 lb 1.6 oz (102.1 kg) 226 lb 9.6 oz (102.8 kg)    Physical Exam    General: Pleasant older male, NAD. Wearing  Neuro: Alert and oriented X 3. Moves all extremities spontaneously. Psych: Normal affect. HEENT:  Normal  Neck: Supple without bruits , + JVD. Lungs:  Resp regular and unlabored, Diminished in lower lobes. Heart: RRR no s3, s4, 2/6 systolic murmur. Abdomen: Soft, non-tender, non-distended, BS + x 4.  Extremities: No clubbing, cyanosis, 1+ bilateral extremity edema noted at the knees. DP/PT/Radials 2+ and equal bilaterally. Lower extremities wrapped.  Labs    CBC  Recent Labs  10/04/15 1144  WBC 7.6  NEUTROABS 5.9  HGB 8.6*  HCT 31.5*  MCV 67.3*  PLT 99991111   Basic Metabolic Panel  Recent Labs  10/05/15 0503 10/06/15 0854  NA 134* 134*  K 3.1* 3.5  CL 91* 93*  CO2 30 29  GLUCOSE 135* 252*  BUN 74* 74*  CREATININE 2.46* 2.56*  CALCIUM 8.9 8.9   Liver Function Tests No results for input(s): AST, ALT, ALKPHOS, BILITOT, PROT, ALBUMIN in the last 72 hours. No results for input(s): LIPASE, AMYLASE in the last 72 hours. Cardiac Enzymes No results for input(s): CKTOTAL, CKMB, CKMBINDEX, TROPONINI in the last 72 hours. BNP Invalid input(s): POCBNP D-Dimer No results for input(s): DDIMER in the last 72 hours. Hemoglobin A1C No results for input(s): HGBA1C in the last 72 hours. Fasting Lipid Panel No results for input(s): CHOL, HDL, LDLCALC, TRIG, CHOLHDL, LDLDIRECT in the last 72 hours. Thyroid Function Tests No results for input(s): TSH, T4TOTAL, T3FREE, THYROIDAB in the last 72 hours.  Invalid input(s): FREET3  Telemetry    SR  ECG    None  Radiology     Assessment & Plan    75 year old male with a past medical history of CAD s/p CABG, chronic diastolic CHF, CKD stage III, carotid artery disease, mitral stenosis, AS s/p TAVR in 2015, and DM. He presented to the office on  09/27/15 with SOB and weakness. Admitted with volume overload.  1. Acute on Chronic diastolic heart failure: Weight up 1lb from yesterday. Weight has been fluctuating this admission. Not as much UOP yesterday 800cc. Cr a little worse this morning . Will continue with IV lasix as he still has pitting edema to his upper legs around the knees, above his Unna boots. Breathing has improved from previously in the week. Reports taking metolazone at home every other day. May need to add?  2. Acute on chronic renal insuff: Cr a little worse. Will continue with IV lasix today. Follow BMET.  3. HTN heart disease: Blood pressure is stable.  4. DM II: SSI  5. CAP: Chest x-ray 7/31 reporting concern for PNA vs Atelectasis. Started on Levaquin 500mg  daily for 5 days. CBC with normal WBC at this time.   5. Hypokalemia: Changed K to 21meq BID yesterday, K better this morning.   6.  CAD s/p CABG with no angina.  Continue ASA/Plavx/statin.  Signed, Reino Bellis NP-C Pager [phone]

## 2015-10-07 LAB — BASIC METABOLIC PANEL
ANION GAP: 11 (ref 5–15)
BUN: 71 mg/dL — ABNORMAL HIGH (ref 6–20)
CHLORIDE: 91 mmol/L — AB (ref 101–111)
CO2: 32 mmol/L (ref 22–32)
Calcium: 9.1 mg/dL (ref 8.9–10.3)
Creatinine, Ser: 2.28 mg/dL — ABNORMAL HIGH (ref 0.61–1.24)
GFR calc non Af Amer: 27 mL/min — ABNORMAL LOW (ref >60)
GFR, EST AFRICAN AMERICAN: 31 mL/min — AB (ref >60)
Glucose, Bld: 210 mg/dL — ABNORMAL HIGH (ref 65–99)
POTASSIUM: 2.8 mmol/L — AB (ref 3.5–5.1)
SODIUM: 134 mmol/L — AB (ref 135–145)

## 2015-10-07 LAB — GLUCOSE, CAPILLARY
GLUCOSE-CAPILLARY: 124 mg/dL — AB (ref 65–99)
GLUCOSE-CAPILLARY: 218 mg/dL — AB (ref 65–99)
GLUCOSE-CAPILLARY: 149 mg/dL — AB (ref 65–99)
GLUCOSE-CAPILLARY: 168 mg/dL — AB (ref 65–99)

## 2015-10-07 MED ORDER — MELATONIN 3 MG PO TABS
3.0000 mg | ORAL_TABLET | Freq: Once | ORAL | Status: AC
  Administered 2015-10-07: 3 mg via ORAL
  Filled 2015-10-07: qty 1

## 2015-10-07 NOTE — Progress Notes (Signed)
SUBJECTIVE:  Feels better  OBJECTIVE:   Vitals:   Vitals:   10/06/15 2102 10/07/15 0000 10/07/15 0347 10/07/15 0500  BP: 121/60 (!) 115/57 (!) 98/49 (!) 95/48  Pulse: 81 79 70 64  Resp: 17   17  Temp: 97.3 F (36.3 C)   98.4 F (36.9 C)  TempSrc: Oral   Oral  SpO2: 97%   100%  Weight:    226 lb (102.5 kg)  Height:       I&O's:   Intake/Output Summary (Last 24 hours) at 10/07/15 1037 Last data filed at 10/07/15 1030  Gross per 24 hour  Intake             1114 ml  Output             4176 ml  Net            -3062 ml   TELEMETRY: Reviewed telemetry pt in NSR:     PHYSICAL EXAM General: Well developed, well nourished, in no acute distress Head: Eyes PERRLA, No xanthomas.   Normal cephalic and atramatic  Lungs:   Crackles at bases Heart:   HRRR S1 S2 Pulses are 2+ & equal. Abdomen: Bowel sounds are positive, abdomen soft and non-tender without masses  Msk:  Back normal, normal gait. Normal strength and tone for age. Extremities:   No clubbing, cyanosis.  2+ pitting edema.  DP +1 Neuro: Alert and oriented X 3. Psych:  Good affect, responds appropriately   LABS: Basic Metabolic Panel:  Recent Labs  10/05/15 0503 10/06/15 0854  NA 134* 134*  K 3.1* 3.5  CL 91* 93*  CO2 30 29  GLUCOSE 135* 252*  BUN 74* 74*  CREATININE 2.46* 2.56*  CALCIUM 8.9 8.9   Liver Function Tests: No results for input(s): AST, ALT, ALKPHOS, BILITOT, PROT, ALBUMIN in the last 72 hours. No results for input(s): LIPASE, AMYLASE in the last 72 hours. CBC:  Recent Labs  10/04/15 1144  WBC 7.6  NEUTROABS 5.9  HGB 8.6*  HCT 31.5*  MCV 67.3*  PLT 212   Cardiac Enzymes: No results for input(s): CKTOTAL, CKMB, CKMBINDEX, TROPONINI in the last 72 hours. BNP: Invalid input(s): POCBNP D-Dimer: No results for input(s): DDIMER in the last 72 hours. Hemoglobin A1C: No results for input(s): HGBA1C in the last 72 hours. Fasting Lipid Panel: No results for input(s): CHOL, HDL, LDLCALC,  TRIG, CHOLHDL, LDLDIRECT in the last 72 hours. Thyroid Function Tests: No results for input(s): TSH, T4TOTAL, T3FREE, THYROIDAB in the last 72 hours.  Invalid input(s): FREET3 Anemia Panel: No results for input(s): VITAMINB12, FOLATE, FERRITIN, TIBC, IRON, RETICCTPCT in the last 72 hours. Coag Panel:   Lab Results  Component Value Date   INR 1.39 06/16/2013   INR 1.29 06/12/2013   INR 1.09 03/19/2013    RADIOLOGY: Dg Chest 2 View  Result Date: 10/03/2015 CLINICAL DATA:  Short of breath and weakness today. History of coronary artery disease, diabetes and hypertension. EXAM: CHEST  2 VIEW COMPARISON:  09/28/2015 FINDINGS: Changes from cardiac surgery and aortic valve replacement are stable. Cardiac silhouette is normal in size. No mediastinal or hilar masses or convincing adenopathy. There is central vascular congestion. Mild bilateral interstitial thickening that is greatest the lower lungs. Additional opacity is noted in both posterior lower lobes. This latter finding is likely atelectasis. Pneumonia is possible. Next item no significant pleural effusion. No pneumothorax. Bony thorax is demineralized but grossly intact. IMPRESSION: 1. Increased basilar opacity when compared the  prior study likely atelectasis. 2. Vascular congestion interstitial thickening is similar to the most recent prior exam allowing for lower lung volumes on the current study. Mild congestive heart failure is suspected. Electronically Signed   By: Lajean Manes M.D.   On: 10/03/2015 14:49   Dg Chest Port 1 View  Result Date: 09/28/2015 CLINICAL DATA:  Shortness of breath with CHF. EXAM: PORTABLE CHEST 1 VIEW COMPARISON:  02/10/2015. FINDINGS: 0541 hours. The cardio pericardial silhouette is enlarged. Vascular congestion noted with interstitial pulmonary edema. No focal airspace consolidation. No substantial pleural effusion. Left basilar atelectasis noted. Bones are diffusely demineralized. Telemetry leads overlie the  chest. IMPRESSION: Cardiomegaly with vascular congestion and interstitial pulmonary edema. Electronically Signed   By: Misty Stanley M.D.   On: 09/28/2015 07:08   Assessment & Plan    75 year old male with a past medical history of CAD s/p CABG, chronic diastolic CHF, CKD stage III, carotid artery disease, mitral stenosis, AS s/p TAVR in 2015, and DM. He presented to the office on 09/27/15 with SOB and weakness. Admitted with volume overload.  1. Acute on Chronic diastolic heart failure: Weight stable from yesterday. Weight has been fluctuating this admission. UOP yesterday 3.7L and net neg 12.6L. Cr pending this morning . Will continue increased dose of IV lasix as he still has pitting edema to his upper legs around the knees, above his Unna boots. Breathing has improved from previously in the week.  His UOP picked up significantly after increasing  Lasix and adding  Metolazone.   2. Acute on chronic renal insuff:  Will continue with IV lasix today.  BMET pending.  3. HTN heart disease: Blood pressure is stable.  4. DM II: SSI  5. CAP: Chest x-ray 7/31 reporting concern for PNA vs Atelectasis. Started on Levaquin 500mg  daily for 5 days. CBC with normal WBC at this time.   5. Hypokalemia: Changed K to 41meq BID.  BMET pending.  6.  CAD s/p CABG with no angina.  Continue ASA/Plavx/statin.     Fransico Him, MD  10/07/2015  10:37 AM

## 2015-10-07 NOTE — Progress Notes (Signed)
Physical Therapy Treatment Patient Details Name: [name] [name]. MRN: [id number] DOB: [birth year] Today's Date: 10/07/2015    History of Present Illness Pt is a 75 y/o F who presented with progressive edema and SOB.  Admitted for acute on chronic diastolic heart failure.  Hypoglycemic episode 7/25 with pt feeling weak and diaphoretic, has been feeling better since.  Pt's PMH includes stasis ulcers Bil LEs, CABG x6, fx of Rt humerus, hearing loss, low back pain, on home O2, TAVR.    PT Comments    Patient more alert and able to ambulate 10 ft to bathroom with RW and minguard assist. He was then too fatigued to walk back to recliner. Patient has one step to enter home and do not feel he is going to be safe to negotiate a step. At this time, recommend discharge home via ambulance. Wife not present to discuss. Other option would be to install a ramp.   Follow Up Recommendations  Home health PT;Supervision/Assistance - 24 hour (anticipate pt will not receive HHPT under Hospice)     Equipment Recommendations  Rolling walker with 5" wheels    Recommendations for Other Services       Precautions / Restrictions Precautions Precautions: Fall Restrictions Weight Bearing Restrictions: No    Mobility  Bed Mobility               General bed mobility comments: sitting EOB  Transfers Overall transfer level: Needs assistance Equipment used: Rolling walker (2 wheeled)   Sit to Stand: Min guard Stand pivot transfers: Min guard       General transfer comment: x 3; pt uses armrests and UE to rise; stooped posture as pivoting  Ambulation/Gait Ambulation/Gait assistance: Min guard Ambulation Distance (Feet): 10 Feet   Gait Pattern/deviations: Step-through pattern;Decreased stride length;Trunk flexed   Gait velocity interpretation: Below normal speed for age/gender General Gait Details: steady as walking to bathroom; when stood from toilet pt reported not feeling well and returned  to sit on toilet; pt unable to be more specific; rolled recliner to doorway of bathroom and transferred to recliner; RN in to assess   Stairs            Wheelchair Mobility    Modified Rankin (Stroke Patients Only)       Balance   Sitting-balance support: No upper extremity supported;Feet supported Sitting balance-Leahy Scale: Good Sitting balance - Comments: reaching outside BOS   Standing balance support: No upper extremity supported;During functional activity Standing balance-Leahy Scale: Fair                      Cognition Arousal/Alertness: Awake/alert Behavior During Therapy: WFL for tasks assessed/performed Overall Cognitive Status: Within Functional Limits for tasks assessed                      Exercises General Exercises - Lower Extremity Heel Slides:  (resisted extension)    General Comments General comments (skin integrity, edema, etc.): Patient enjoys talking, joking, and wants staff to slow down       Pertinent Vitals/Pain SaO2 would not register due to cold hands; on 4L throughout session   Pain Assessment: No/denies pain    Home Living                      Prior Function            PT Goals (current goals can now be found in the care plan  section) Acute Rehab PT Goals Patient Stated Goal: decreased pain, be able to walk farther Time For Goal Achievement: 10/12/15 Progress towards PT goals: Progressing toward goals    Frequency  Min 3X/week    PT Plan Current plan remains appropriate    Co-evaluation             End of Session Equipment Utilized During Treatment: Oxygen Activity Tolerance: Patient limited by fatigue Patient left: with call bell/phone within reach;in chair;with chair alarm set;with nursing/sitter in room;with family/visitor present     Time: 1556-1640 PT Time Calculation (min) (ACUTE ONLY): 44 min  Charges:  $Gait Training: 8-22 mins $Therapeutic Activity: 8-22 mins                     G Codes:      [name] [name] 11/04/2015, 5:28 PM Pager [phone]

## 2015-10-08 DIAGNOSIS — I110 Hypertensive heart disease with heart failure: Secondary | ICD-10-CM

## 2015-10-08 DIAGNOSIS — I25118 Atherosclerotic heart disease of native coronary artery with other forms of angina pectoris: Secondary | ICD-10-CM

## 2015-10-08 DIAGNOSIS — N179 Acute kidney failure, unspecified: Secondary | ICD-10-CM

## 2015-10-08 DIAGNOSIS — N189 Chronic kidney disease, unspecified: Secondary | ICD-10-CM

## 2015-10-08 DIAGNOSIS — E876 Hypokalemia: Secondary | ICD-10-CM

## 2015-10-08 DIAGNOSIS — Z7189 Other specified counseling: Secondary | ICD-10-CM

## 2015-10-08 DIAGNOSIS — Z79899 Other long term (current) drug therapy: Secondary | ICD-10-CM

## 2015-10-08 DIAGNOSIS — R531 Weakness: Secondary | ICD-10-CM

## 2015-10-08 LAB — GLUCOSE, CAPILLARY
GLUCOSE-CAPILLARY: 106 mg/dL — AB (ref 65–99)
GLUCOSE-CAPILLARY: 235 mg/dL — AB (ref 65–99)
Glucose-Capillary: 157 mg/dL — ABNORMAL HIGH (ref 65–99)
Glucose-Capillary: 155 mg/dL — ABNORMAL HIGH (ref 65–99)

## 2015-10-08 LAB — BASIC METABOLIC PANEL
ANION GAP: 8 (ref 5–15)
BUN: 65 mg/dL — ABNORMAL HIGH (ref 6–20)
CALCIUM: 9 mg/dL (ref 8.9–10.3)
CO2: 34 mmol/L — ABNORMAL HIGH (ref 22–32)
CREATININE: 2.24 mg/dL — AB (ref 0.61–1.24)
Chloride: 92 mmol/L — ABNORMAL LOW (ref 101–111)
GFR, EST AFRICAN AMERICAN: 32 mL/min — AB (ref >60)
GFR, EST NON AFRICAN AMERICAN: 27 mL/min — AB (ref >60)
Glucose, Bld: 220 mg/dL — ABNORMAL HIGH (ref 65–99)
Potassium: 3.4 mmol/L — ABNORMAL LOW (ref 3.5–5.1)
SODIUM: 134 mmol/L — AB (ref 135–145)

## 2015-10-08 NOTE — Progress Notes (Signed)
SUBJECTIVE: Pt asleep. I spoke with his wife. He ate breakfast this morning and his SOB/weakness had improved. They had a difficult night last night, discussing hospice and talking to family members. No chest pain.   ROS: Other than pertinent positives in "Subjective", all others were reviewed and found to be negative.   Intake/Output Summary (Last 24 hours) at 10/08/15 1156 Last data filed at 10/08/15 [id number]  Gross per 24 hour  Intake              123 ml  Output             2175 ml  Net            -2052 ml    Current Facility-Administered Medications  Medication Dose Route Frequency Provider Last Rate Last Dose  . 0.9 %  sodium chloride infusion  250 mL Intravenous PRN [name], MD      . acetaminophen (TYLENOL) tablet 650 mg  650 mg Oral Q4H PRN [name], MD   325 mg at 10/07/15 0012  . ALPRAZolam Duanne Moron) tablet 0.25 mg  0.25 mg Oral Daily [name], MD   0.25 mg at 10/08/15 0920  . aspirin EC tablet 81 mg  81 mg Oral Daily [name], MD   81 mg at 10/08/15 0920  . clopidogrel (PLAVIX) tablet 75 mg  75 mg Oral Q breakfast [name], MD   75 mg at 10/08/15 0604  . colchicine tablet 0.3 mg  0.3 mg Oral Daily PRN Sueanne Margarita, MD      . enoxaparin (LOVENOX) injection 30 mg  30 mg Subcutaneous Daily Valeda Malm Rumbarger, RPH   30 mg at 10/08/15 [id number]  . famotidine (PEPCID) tablet 20 mg  20 mg Oral Daily [name], MD   20 mg at 10/08/15 0920  . feeding supplement (GLUCERNA SHAKE) (GLUCERNA SHAKE) liquid 237 mL  237 mL Oral BID BM [name], MD   237 mL at 10/07/15 1000  . furosemide (LASIX) injection 80 mg  80 mg Intravenous Q8H Sueanne Margarita, MD   80 mg at 10/08/15 0920  . insulin aspart (novoLOG) injection 0-15 Units  0-15 Units Subcutaneous TID WC [name], MD   2 Units at 10/07/15 1700  . insulin aspart (novoLOG) injection 10 Units  10 Units Subcutaneous TID WC [name], MD   10 Units at 10/07/15 1700  . linagliptin (TRADJENTA)  tablet 5 mg  5 mg Oral Daily [name], MD   5 mg at 10/08/15 0920  . methocarbamol (ROBAXIN) tablet 500 mg  500 mg Oral Daily PRN [name], MD   500 mg at 10/06/15 1705  . metolazone (ZAROXOLYN) tablet 2.5 mg  2.5 mg Oral QODAY Sueanne Margarita, MD   2.5 mg at 10/08/15 0920  . nitroGLYCERIN (NITROSTAT) SL tablet 0.4 mg  0.4 mg Sublingual Q5 min PRN [name], MD      . ondansetron [facility]) injection 4 mg  4 mg Intravenous Q6H PRN [name], MD   4 mg at 10/01/15 1001  . oxyCODONE (Oxy IR/ROXICODONE) immediate release tablet 5 mg  5 mg Oral Q4H PRN Melton Alar, PA-C   5 mg at 10/08/15 0055  . pantoprazole (PROTONIX) EC tablet 40 mg  40 mg Oral q morning - 10a [name], MD   40 mg at 10/08/15 0920  . potassium chloride SA (K-DUR,KLOR-CON) CR tablet 60 mEq  60 mEq Oral BID Rosalene Billings  Mancel Bale, NP   60 mEq at 10/08/15 0920  . repaglinide (PRANDIN) tablet 2 mg  2 mg Oral TID [facility] [name], MD   2 mg at 10/08/15 0919  . rosuvastatin (CRESTOR) tablet 20 mg  20 mg Oral QHS [name], MD   20 mg at 10/07/15 2110  . senna-docusate (Senokot-S) tablet 2 tablet  2 tablet Oral QHS Melton Alar, PA-C   2 tablet at 10/07/15 2110  . sertraline (ZOLOFT) tablet 100 mg  100 mg Oral Daily [name], MD   100 mg at 10/08/15 [id number]  . sodium chloride flush (NS) 0.9 % injection 3 mL  3 mL Intravenous Q12H [name], MD   3 mL at 10/08/15 [id number]  . sodium chloride flush (NS) 0.9 % injection 3 mL  3 mL Intravenous PRN [name], MD      . Zinc Oxide (TRIPLE PASTE) 12.8 % ointment   Topical PRN [name], MD        Vitals:   10/07/15 1136 10/07/15 1947 10/08/15 0050 10/08/15 0506  BP: (!) 104/54 113/68 119/62 (!) 93/40  Pulse: 80 73  67  Resp: 16 16  17   Temp: 98.2 F (36.8 C) 98.4 F (36.9 C)  97.8 F (36.6 C)  TempSrc: Oral Oral  Oral  SpO2: 100% 99%  98%  Weight:    223 lb 4.8 oz (101.3 kg)  Height:        PHYSICAL EXAM General: NAD HEENT:  Normal. Neck: No JVD, no thyromegaly.  Lungs: Clear to auscultation bilaterally with normal respiratory effort. CV: Nondisplaced PMI.  Regular rate and rhythm, normal S1/S2, no S3/S4, no murmur.  2+ pitting pretibial edema. Abdomen: Soft, no distention.  Neurologic: Asleep.  Psych: Asleep. Musculoskeletal: No gross deformities. Extremities: No clubbing or cyanosis.    LABS: Basic Metabolic Panel:  Recent Labs  10/06/15 0854 10/07/15 1134  NA 134* 134*  K 3.5 2.8*  CL 93* 91*  CO2 29 32  GLUCOSE 252* 210*  BUN 74* 71*  CREATININE 2.56* 2.28*  CALCIUM 8.9 9.1   Liver Function Tests: No results for input(s): AST, ALT, ALKPHOS, BILITOT, PROT, ALBUMIN in the last 72 hours. No results for input(s): LIPASE, AMYLASE in the last 72 hours. CBC: No results for input(s): WBC, NEUTROABS, HGB, HCT, MCV, PLT in the last 72 hours. Cardiac Enzymes: No results for input(s): CKTOTAL, CKMB, CKMBINDEX, TROPONINI in the last 72 hours. BNP: Invalid input(s): POCBNP D-Dimer: No results for input(s): DDIMER in the last 72 hours. Hemoglobin A1C: No results for input(s): HGBA1C in the last 72 hours. Fasting Lipid Panel: No results for input(s): CHOL, HDL, LDLCALC, TRIG, CHOLHDL, LDLDIRECT in the last 72 hours. Thyroid Function Tests: No results for input(s): TSH, T4TOTAL, T3FREE, THYROIDAB in the last 72 hours.  Invalid input(s): FREET3 Anemia Panel: No results for input(s): VITAMINB12, FOLATE, FERRITIN, TIBC, IRON, RETICCTPCT in the last 72 hours.  RADIOLOGY: Dg Chest 2 View  Result Date: 10/03/2015 CLINICAL DATA:  Short of breath and weakness today. History of coronary artery disease, diabetes and hypertension. EXAM: CHEST  2 VIEW COMPARISON:  09/28/2015 FINDINGS: Changes from cardiac surgery and aortic valve replacement are stable. Cardiac silhouette is normal in size. No mediastinal or hilar masses or convincing adenopathy. There is central vascular congestion. Mild bilateral interstitial  thickening that is greatest the lower lungs. Additional opacity is noted in both posterior lower lobes. This latter finding is likely atelectasis. Pneumonia is possible. Next item no significant pleural effusion.  No pneumothorax. Bony thorax is demineralized but grossly intact. IMPRESSION: 1. Increased basilar opacity when compared the prior study likely atelectasis. 2. Vascular congestion interstitial thickening is similar to the most recent prior exam allowing for lower lung volumes on the current study. Mild congestive heart failure is suspected. Electronically Signed   By: Lajean Manes M.D.   On: 10/03/2015 14:49   Dg Chest Port 1 View  Result Date: 09/28/2015 CLINICAL DATA:  Shortness of breath with CHF. EXAM: PORTABLE CHEST 1 VIEW COMPARISON:  02/10/2015. FINDINGS: 0541 hours. The cardio pericardial silhouette is enlarged. Vascular congestion noted with interstitial pulmonary edema. No focal airspace consolidation. No substantial pleural effusion. Left basilar atelectasis noted. Bones are diffusely demineralized. Telemetry leads overlie the chest. IMPRESSION: Cardiomegaly with vascular congestion and interstitial pulmonary edema. Electronically Signed   By: Misty Stanley M.D.   On: 09/28/2015 07:08     ASSESSMENT AND PLAN: 75 year old male with a past medical history of CAD s/p CABG, chronic diastolic CHF, CKD stage III, carotid artery disease, mitral stenosis, AS s/p TAVR in 2015, and DM. He presented to the office on 09/27/15 with SOB and weakness. Admitted with volume overload.  1. Acute on Chronic diastolic heart failure:  Good urine output on current diuretic regimen which includes IV Lasix 80 mg tid and metolazone 2.5 mg QOD. Nearly 5L output in last 48 hrs.  2. Acute on chronic renal insuff:  Will continue with IV lasix today.  BUN/SCr elevated but stable on 8/4. Will recheck.   3. HTN heart disease:Blood pressure is low normal but stable.  4. DM II:SSI  5. [id number] x-ray  7/31 reporting concern for PNA vs Atelectasis. Started on Levaquin 500mg  daily for 5 days. CBC with normal WBC at this time (8/1).   5. Hypokalemia:Changed K to 62meq BID.  Repeat BMET. K 2.8 yesterday.  6. CAD s/p CABG with no angina. Continue ASA/Plavx/statin.    Kate Sable, M.D., F.A.C.C.

## 2015-10-09 DIAGNOSIS — I25709 Atherosclerosis of coronary artery bypass graft(s), unspecified, with unspecified angina pectoris: Secondary | ICD-10-CM

## 2015-10-09 LAB — GLUCOSE, CAPILLARY
GLUCOSE-CAPILLARY: 116 mg/dL — AB (ref 65–99)
Glucose-Capillary: 170 mg/dL — ABNORMAL HIGH (ref 65–99)
Glucose-Capillary: 157 mg/dL — ABNORMAL HIGH (ref 65–99)
Glucose-Capillary: 117 mg/dL — ABNORMAL HIGH (ref 65–99)

## 2015-10-09 LAB — BASIC METABOLIC PANEL
ANION GAP: 11 (ref 5–15)
BUN: 58 mg/dL — ABNORMAL HIGH (ref 6–20)
CALCIUM: 9 mg/dL (ref 8.9–10.3)
CO2: 34 mmol/L — AB (ref 22–32)
CREATININE: 2.21 mg/dL — AB (ref 0.61–1.24)
Chloride: 90 mmol/L — ABNORMAL LOW (ref 101–111)
GFR calc Af Amer: 32 mL/min — ABNORMAL LOW (ref >60)
GFR calc non Af Amer: 28 mL/min — ABNORMAL LOW (ref >60)
GLUCOSE: 154 mg/dL — AB (ref 65–99)
Potassium: 2.4 mmol/L — CL (ref 3.5–5.1)
Sodium: 135 mmol/L (ref 135–145)

## 2015-10-09 MED ORDER — POTASSIUM CHLORIDE CRYS ER 20 MEQ PO TBCR
120.0000 meq | EXTENDED_RELEASE_TABLET | Freq: Two times a day (BID) | ORAL | Status: DC
  Administered 2015-10-09 – 2015-10-12 (×6): 120 meq via ORAL
  Filled 2015-10-09 (×7): qty 6

## 2015-10-09 MED ORDER — FUROSEMIDE 10 MG/ML IJ SOLN
80.0000 mg | Freq: Two times a day (BID) | INTRAMUSCULAR | Status: DC
  Administered 2015-10-09 – 2015-10-11 (×4): 80 mg via INTRAVENOUS
  Filled 2015-10-09 (×4): qty 8

## 2015-10-09 MED ORDER — CLOPIDOGREL BISULFATE 75 MG PO TABS
75.0000 mg | ORAL_TABLET | Freq: Every day | ORAL | Status: DC
  Administered 2015-10-09 – 2015-10-12 (×4): 75 mg via ORAL
  Filled 2015-10-09 (×4): qty 1

## 2015-10-09 MED ORDER — ASPIRIN EC 81 MG PO TBEC
81.0000 mg | DELAYED_RELEASE_TABLET | Freq: Every day | ORAL | Status: DC
  Administered 2015-10-09 – 2015-10-11 (×3): 81 mg via ORAL
  Filled 2015-10-09 (×4): qty 1

## 2015-10-09 MED ORDER — POTASSIUM CHLORIDE CRYS ER 20 MEQ PO TBCR
60.0000 meq | EXTENDED_RELEASE_TABLET | ORAL | Status: AC
  Administered 2015-10-09: 60 meq via ORAL
  Filled 2015-10-09: qty 3

## 2015-10-09 MED ORDER — INSULIN ASPART 100 UNIT/ML ~~LOC~~ SOLN
0.0000 [IU] | Freq: Three times a day (TID) | SUBCUTANEOUS | Status: DC
Start: 2015-10-09 — End: 2015-10-12
  Administered 2015-10-09 (×2): 3 [IU] via SUBCUTANEOUS
  Administered 2015-10-10: 2 [IU] via SUBCUTANEOUS
  Administered 2015-10-11: 5 [IU] via SUBCUTANEOUS
  Administered 2015-10-11 – 2015-10-12 (×3): 2 [IU] via SUBCUTANEOUS

## 2015-10-09 NOTE — Progress Notes (Signed)
CRITICAL VALUE ALERT  Critical value received:  Potassium 2.4  Date of notification:  10/09/15  Time of notification:  1200  Critical value read back:Yes.    Nurse who received alert:  Waynetta Sandy, RN  MD notified (1st page):  Dr. Bronson Ing  Time of first page:  1200  MD notified (2nd page):  Time of second page:  Responding MD:  Dr. Bronson Ing  Time MD responded:  [id number]

## 2015-10-09 NOTE — Progress Notes (Signed)
SUBJECTIVE: Denies chest pain. Has exertional dyspnea. Feeling better overall. He and his wife have been married for 52 years.   ROS: Other than pertinent positives in "Subjective", all others were reviewed and found to be negative.   Intake/Output Summary (Last 24 hours) at 10/09/15 1134 Last data filed at 10/09/15 1026  Gross per 24 hour  Intake              483 ml  Output             3475 ml  Net            -2992 ml    Current Facility-Administered Medications  Medication Dose Route Frequency Provider Last Rate Last Dose  . 0.9 %  sodium chloride infusion  250 mL Intravenous PRN [name], MD      . acetaminophen (TYLENOL) tablet 650 mg  650 mg Oral Q4H PRN [name], MD   325 mg at 10/07/15 0012  . ALPRAZolam Duanne Moron) tablet 0.25 mg  0.25 mg Oral Daily [name], MD   0.25 mg at 10/09/15 1026  . aspirin EC tablet 81 mg  81 mg Oral Daily [name], MD   81 mg at 10/08/15 0920  . clopidogrel (PLAVIX) tablet 75 mg  75 mg Oral Q breakfast [name], MD   75 mg at 10/09/15 0826  . colchicine tablet 0.3 mg  0.3 mg Oral Daily PRN Sueanne Margarita, MD      . enoxaparin (LOVENOX) injection 30 mg  30 mg Subcutaneous Daily Rachel L Rumbarger, RPH   30 mg at 10/09/15 1028  . famotidine (PEPCID) tablet 20 mg  20 mg Oral Daily [name], MD   20 mg at 10/09/15 1027  . feeding supplement (GLUCERNA SHAKE) (GLUCERNA SHAKE) liquid 237 mL  237 mL Oral BID BM [name], MD   237 mL at 10/09/15 1000  . furosemide (LASIX) injection 80 mg  80 mg Intravenous Q8H Sueanne Margarita, MD   80 mg at 10/09/15 1026  . insulin aspart (novoLOG) injection 0-15 Units  0-15 Units Subcutaneous TID WC [name], MD   3 Units at 10/09/15 [phone]  . insulin aspart (novoLOG) injection 10 Units  10 Units Subcutaneous TID WC [name], MD   10 Units at 10/09/15 [phone]  . linagliptin (TRADJENTA) tablet 5 mg  5 mg Oral Daily [name], MD   5 mg at 10/09/15 1026  .  methocarbamol (ROBAXIN) tablet 500 mg  500 mg Oral Daily PRN [name], MD   500 mg at 10/06/15 1705  . metolazone (ZAROXOLYN) tablet 2.5 mg  2.5 mg Oral QODAY Sueanne Margarita, MD   2.5 mg at 10/08/15 0920  . nitroGLYCERIN (NITROSTAT) SL tablet 0.4 mg  0.4 mg Sublingual Q5 min PRN [name], MD      . ondansetron [facility]) injection 4 mg  4 mg Intravenous Q6H PRN [name], MD   4 mg at 10/01/15 1001  . oxyCODONE (Oxy IR/ROXICODONE) immediate release tablet 5 mg  5 mg Oral Q4H PRN Melton Alar, PA-C   5 mg at 10/09/15 0110  . pantoprazole (PROTONIX) EC tablet 40 mg  40 mg Oral q morning - 10a [name], MD   40 mg at 10/09/15 1027  . potassium chloride SA (K-DUR,KLOR-CON) CR tablet 60 mEq  60 mEq Oral BID Cheryln Manly, NP   60 mEq at 10/09/15 1026  . repaglinide (PRANDIN) tablet 2  mg  2 mg Oral TID [facility] [name], MD   2 mg at 10/09/15 1028  . rosuvastatin (CRESTOR) tablet 20 mg  20 mg Oral QHS [name], MD   20 mg at 10/08/15 2131  . senna-docusate (Senokot-S) tablet 2 tablet  2 tablet Oral QHS Melton Alar, PA-C   2 tablet at 10/08/15 2131  . sertraline (ZOLOFT) tablet 100 mg  100 mg Oral Daily [name], MD   100 mg at 10/09/15 1026  . sodium chloride flush (NS) 0.9 % injection 3 mL  3 mL Intravenous Q12H [name], MD   3 mL at 10/09/15 1027  . sodium chloride flush (NS) 0.9 % injection 3 mL  3 mL Intravenous PRN [name], MD      . Zinc Oxide (TRIPLE PASTE) 12.8 % ointment   Topical PRN [name], MD        Vitals:   10/08/15 0506 10/08/15 1200 10/08/15 2005 10/09/15 0400  BP: (!) 93/40 (!) 89/71 (!) 107/47 100/62  Pulse: 67  66 60  Resp: 17 18 18 18   Temp: 97.8 F (36.6 C) 98.4 F (36.9 C) 97.5 F (36.4 C) 98.7 F (37.1 C)  TempSrc: Oral Oral Oral Oral  SpO2: 98% 98% 100% 100%  Weight: 223 lb 4.8 oz (101.3 kg)   219 lb 9.6 oz (99.6 kg)  Height:        PHYSICAL EXAM General: NAD HEENT: Normal. Neck: No JVD, no  thyromegaly.  Lungs: Diminished throughout, no rales/wheezes. CV: Nondisplaced PMI.  Regular rate and rhythm, normal S1/S2, no S3/S4, no murmur.  1-2+ pitting pretibial edema. Legs bandaged.  Abdomen: Soft, no distention.  Neurologic: Asleep.  Psych: Asleep. Musculoskeletal: No gross deformities.    LABS: Basic Metabolic Panel:  Recent Labs  10/07/15 1134 10/08/15 1228  NA 134* 134*  K 2.8* 3.4*  CL 91* 92*  CO2 32 34*  GLUCOSE 210* 220*  BUN 71* 65*  CREATININE 2.28* 2.24*  CALCIUM 9.1 9.0   Liver Function Tests: No results for input(s): AST, ALT, ALKPHOS, BILITOT, PROT, ALBUMIN in the last 72 hours. No results for input(s): LIPASE, AMYLASE in the last 72 hours. CBC: No results for input(s): WBC, NEUTROABS, HGB, HCT, MCV, PLT in the last 72 hours. Cardiac Enzymes: No results for input(s): CKTOTAL, CKMB, CKMBINDEX, TROPONINI in the last 72 hours. BNP: Invalid input(s): POCBNP D-Dimer: No results for input(s): DDIMER in the last 72 hours. Hemoglobin A1C: No results for input(s): HGBA1C in the last 72 hours. Fasting Lipid Panel: No results for input(s): CHOL, HDL, LDLCALC, TRIG, CHOLHDL, LDLDIRECT in the last 72 hours. Thyroid Function Tests: No results for input(s): TSH, T4TOTAL, T3FREE, THYROIDAB in the last 72 hours.  Invalid input(s): FREET3 Anemia Panel: No results for input(s): VITAMINB12, FOLATE, FERRITIN, TIBC, IRON, RETICCTPCT in the last 72 hours.  RADIOLOGY: Dg Chest 2 View  Result Date: 10/03/2015 CLINICAL DATA:  Short of breath and weakness today. History of coronary artery disease, diabetes and hypertension. EXAM: CHEST  2 VIEW COMPARISON:  09/28/2015 FINDINGS: Changes from cardiac surgery and aortic valve replacement are stable. Cardiac silhouette is normal in size. No mediastinal or hilar masses or convincing adenopathy. There is central vascular congestion. Mild bilateral interstitial thickening that is greatest the lower lungs. Additional opacity is  noted in both posterior lower lobes. This latter finding is likely atelectasis. Pneumonia is possible. Next item no significant pleural effusion. No pneumothorax. Bony thorax is demineralized but grossly intact. IMPRESSION: 1. Increased  basilar opacity when compared the prior study likely atelectasis. 2. Vascular congestion interstitial thickening is similar to the most recent prior exam allowing for lower lung volumes on the current study. Mild congestive heart failure is suspected. Electronically Signed   By: Lajean Manes M.D.   On: 10/03/2015 14:49   Dg Chest Port 1 View  Result Date: 09/28/2015 CLINICAL DATA:  Shortness of breath with CHF. EXAM: PORTABLE CHEST 1 VIEW COMPARISON:  02/10/2015. FINDINGS: 0541 hours. The cardio pericardial silhouette is enlarged. Vascular congestion noted with interstitial pulmonary edema. No focal airspace consolidation. No substantial pleural effusion. Left basilar atelectasis noted. Bones are diffusely demineralized. Telemetry leads overlie the chest. IMPRESSION: Cardiomegaly with vascular congestion and interstitial pulmonary edema. Electronically Signed   By: Misty Stanley M.D.   On: 09/28/2015 07:08     ASSESSMENT AND PLAN: 75 year old male with a past medical history of CAD s/p CABG, chronic diastolic CHF, CKD stage III, carotid artery disease, mitral stenosis, AS s/p TAVR in 2015, and DM. He presented to the office on 09/27/15 with SOB and weakness. Admitted with volume overload.  1. Acute on Chronic diastolic heart failure:  Good urine output on current diuretic regimen which includes IV Lasix 80 mg tid and metolazone 2.5 mg QOD. Nearly 5L output in last 48 hrs. Will reduce to bid dosing.  2. Acute on chronic renal insuff: Will continue with IV lasix today (decrease to bid dosing). BUN/SCr elevated but decreasing since 8/4. Will recheck.   3. HTN heart disease:Blood pressure is low normal but stable. Decrease IV Lasix to bid dosing.  4. DM  II:SSI  5. [id number] x-ray 7/31 reporting concern for PNA vs Atelectasis. Started on Levaquin 500mg  daily for 5 days. CBC with normal WBC at this time (8/1).   5. Hypokalemia:Changed K to 9meq BID. Repeat BMET. K 3.4 yesterday.  6. CAD s/p CABG with no angina. Continue ASA/Plavx/statin.    Kate Sable, M.D., F.A.C.C.

## 2015-10-10 DIAGNOSIS — K5900 Constipation, unspecified: Secondary | ICD-10-CM

## 2015-10-10 DIAGNOSIS — N184 Chronic kidney disease, stage 4 (severe): Secondary | ICD-10-CM

## 2015-10-10 LAB — BASIC METABOLIC PANEL
Anion gap: 10 (ref 5–15)
BUN: 58 mg/dL — ABNORMAL HIGH (ref 6–20)
CHLORIDE: 94 mmol/L — AB (ref 101–111)
CO2: 32 mmol/L (ref 22–32)
CREATININE: 2.35 mg/dL — AB (ref 0.61–1.24)
Calcium: 8.9 mg/dL (ref 8.9–10.3)
GFR calc non Af Amer: 26 mL/min — ABNORMAL LOW (ref >60)
GFR, EST AFRICAN AMERICAN: 30 mL/min — AB (ref >60)
GLUCOSE: 143 mg/dL — AB (ref 65–99)
Potassium: 3.5 mmol/L (ref 3.5–5.1)
Sodium: 136 mmol/L (ref 135–145)

## 2015-10-10 LAB — GLUCOSE, CAPILLARY
GLUCOSE-CAPILLARY: 133 mg/dL — AB (ref 65–99)
GLUCOSE-CAPILLARY: 115 mg/dL — AB (ref 65–99)
Glucose-Capillary: 92 mg/dL (ref 65–99)
Glucose-Capillary: 223 mg/dL — ABNORMAL HIGH (ref 65–99)

## 2015-10-10 MED ORDER — POLYETHYLENE GLYCOL 3350 17 G PO PACK
17.0000 g | PACK | Freq: Every day | ORAL | Status: DC
  Administered 2015-10-10 – 2015-10-12 (×3): 17 g via ORAL
  Filled 2015-10-10 (×3): qty 1

## 2015-10-10 NOTE — Progress Notes (Signed)
SUBJECTIVE: The patient states that he feels better, he was able to walk without a walker and is able to sleep in a horizontal position. His wife states that his LE are still more swollen than at baseline. He hasn't had a bowel movement in 3 days.   ROS: Other than pertinent positives in "Subjective", all others were reviewed and found to be negative.  Intake/Output Summary (Last 24 hours) at 10/10/15 0950 Last data filed at 10/10/15 0916  Gross per 24 hour  Intake             1323 ml  Output             2250 ml  Net             -927 ml   Current Facility-Administered Medications  Medication Dose Route Frequency Provider Last Rate Last Dose  . 0.9 %  sodium chloride infusion  250 mL Intravenous PRN [name], MD      . acetaminophen (TYLENOL) tablet 650 mg  650 mg Oral Q4H PRN [name], MD   325 mg at 10/07/15 0012  . ALPRAZolam Duanne Moron) tablet 0.25 mg  0.25 mg Oral Daily [name], MD   0.25 mg at 10/09/15 1026  . aspirin EC tablet 81 mg  81 mg Oral Daily [name], MD   81 mg at 10/09/15 2132  . clopidogrel (PLAVIX) tablet 75 mg  75 mg Oral Q breakfast [name], MD   75 mg at 10/09/15 0826  . colchicine tablet 0.3 mg  0.3 mg Oral Daily PRN Sueanne Margarita, MD      . enoxaparin (LOVENOX) injection 30 mg  30 mg Subcutaneous Daily Rachel L Rumbarger, RPH   30 mg at 10/09/15 1028  . famotidine (PEPCID) tablet 20 mg  20 mg Oral Daily [name], MD   20 mg at 10/09/15 1027  . feeding supplement (GLUCERNA SHAKE) (GLUCERNA SHAKE) liquid 237 mL  237 mL Oral BID BM [name], MD   237 mL at 10/09/15 1000  . furosemide (LASIX) injection 80 mg  80 mg Intravenous BID Herminio Commons, MD   80 mg at 10/09/15 1707  . insulin aspart (novoLOG) injection 0-15 Units  0-15 Units Subcutaneous TID WC [name], MD   Stopped at 10/10/15 [id number]  . insulin aspart (novoLOG) injection 10 Units  10 Units Subcutaneous TID WC [name], MD   10 Units at 10/10/15 [phone]    . linagliptin (TRADJENTA) tablet 5 mg  5 mg Oral Daily [name], MD   5 mg at 10/09/15 1026  . methocarbamol (ROBAXIN) tablet 500 mg  500 mg Oral Daily PRN [name], MD   500 mg at 10/06/15 1705  . metolazone (ZAROXOLYN) tablet 2.5 mg  2.5 mg Oral QODAY Sueanne Margarita, MD   2.5 mg at 10/08/15 0920  . nitroGLYCERIN (NITROSTAT) SL tablet 0.4 mg  0.4 mg Sublingual Q5 min PRN [name], MD      . ondansetron [facility]) injection 4 mg  4 mg Intravenous Q6H PRN [name], MD   4 mg at 10/01/15 1001  . oxyCODONE (Oxy IR/ROXICODONE) immediate release tablet 5 mg  5 mg Oral Q4H PRN Melton Alar, PA-C   5 mg at 10/09/15 1351  . pantoprazole (PROTONIX) EC tablet 40 mg  40 mg Oral q morning - 10a [name], MD   40 mg at 10/09/15 1027  . potassium chloride SA (K-DUR,KLOR-CON) CR tablet 120 mEq  120 mEq Oral BID Herminio Commons, MD   120 mEq at 10/09/15 2131  . repaglinide (PRANDIN) tablet 2 mg  2 mg Oral TID [facility] [name], MD   2 mg at 10/10/15 0555  . rosuvastatin (CRESTOR) tablet 20 mg  20 mg Oral QHS [name], MD   20 mg at 10/09/15 2132  . senna-docusate (Senokot-S) tablet 2 tablet  2 tablet Oral QHS Melton Alar, PA-C   2 tablet at 10/09/15 2132  . sertraline (ZOLOFT) tablet 100 mg  100 mg Oral Daily [name], MD   100 mg at 10/09/15 1026  . sodium chloride flush (NS) 0.9 % injection 3 mL  3 mL Intravenous Q12H [name], MD   3 mL at 10/09/15 2132  . sodium chloride flush (NS) 0.9 % injection 3 mL  3 mL Intravenous PRN [name], MD      . Zinc Oxide (TRIPLE PASTE) 12.8 % ointment   Topical PRN [name], MD       Vitals:   10/09/15 1700 10/09/15 1802 10/09/15 1959 10/10/15 0429  BP:  (!) 94/49 (!) 101/59 (!) 93/47  Pulse:  (!) 58 69 79  Resp: 18 18 18 18   Temp:  98.7 F (37.1 C) 97.6 F (36.4 C) 97.7 F (36.5 C)  TempSrc:  Oral Oral Oral  SpO2:  100% 100% 100%  Weight:    220 lb 4.8 oz (99.9 kg)  Height:       PHYSICAL  EXAM General: NAD HEENT: Normal. Neck: No JVD, no thyromegaly.  Lungs: Diminished throughout, no rales/wheezes. CV: Nondisplaced PMI.  Regular rate and rhythm, normal S1/S2, no S3/S4, no murmur.  2+ pitting pretibial edema. Legs bandaged.  Abdomen: Soft, no distention.  Neurologic: Asleep.  Psych: Asleep. Musculoskeletal: No gross deformities.  LABS: Basic Metabolic Panel:  Recent Labs  10/08/15 1228 10/09/15 1059  NA 134* 135  K 3.4* 2.4*  CL 92* 90*  CO2 34* 34*  GLUCOSE 220* 154*  BUN 65* 58*  CREATININE 2.24* 2.21*  CALCIUM 9.0 9.0   RADIOLOGY: Dg Chest 2 View  Result Date: 10/03/2015 CLINICAL DATA:  Short of breath and weakness today. History of coronary artery disease, diabetes and hypertension. EXAM: CHEST  2 VIEW COMPARISON:  09/28/2015 FINDINGS: Changes from cardiac surgery and aortic valve replacement are stable. Cardiac silhouette is normal in size. No mediastinal or hilar masses or convincing adenopathy. There is central vascular congestion. Mild bilateral interstitial thickening that is greatest the lower lungs. Additional opacity is noted in both posterior lower lobes. This latter finding is likely atelectasis. Pneumonia is possible. Next item no significant pleural effusion. No pneumothorax. Bony thorax is demineralized but grossly intact. IMPRESSION: 1. Increased basilar opacity when compared the prior study likely atelectasis. 2. Vascular congestion interstitial thickening is similar to the most recent prior exam allowing for lower lung volumes on the current study. Mild congestive heart failure is suspected. Electronically Signed   By: Lajean Manes M.D.   On: 10/03/2015 14:49   Dg Chest Port 1 View  Result Date: 09/28/2015 CLINICAL DATA:  Shortness of breath with CHF. EXAM: PORTABLE CHEST 1 VIEW COMPARISON:  02/10/2015. FINDINGS: 0541 hours. The cardio pericardial silhouette is enlarged. Vascular congestion noted with interstitial pulmonary edema. No focal  airspace consolidation. No substantial pleural effusion. Left basilar atelectasis noted. Bones are diffusely demineralized. Telemetry leads overlie the chest. IMPRESSION: Cardiomegaly with vascular congestion and interstitial pulmonary edema. Electronically Signed   By: Verda Cumins.D.  On: 09/28/2015 07:08    ASSESSMENT AND PLAN:  75 year old male with a past medical history of CAD s/p CABG, chronic diastolic CHF, CKD stage III, carotid artery disease, mitral stenosis, AS s/p TAVR in 2015, and DM. He presented to the office on 09/27/15 with SOB and weakness. Admitted with volume overload.  1. Acute on Chronic diastolic heart failure:  Good urine output on current diuretic regimen which includes IV Lasix 80 mg tid and metolazone 2.5 mg QOD. He is almost at baseline of 218 lbs, currently 220 lbs, however still LE edema. I would plan on one more day of iv lasix regimen, possibly switch to PO tomorrow and d/c on Wednesday if good response. Crea stable at 2.1 (baseline 1.8 - 2.2).  2. Acute on chronic renal insuff: Will continue with IV lasix today. BUN/SCr elevated but decreasing since 8/4. Will follow.   3. HTN heart disease:Blood pressure is low normal but stable.   4. DM II:SSI  5. [id number] x-ray 7/31 reporting concern for PNA vs Atelectasis. Started on Levaquin 500mg  daily for 5 days. CBC with normal WBC at this time (8/1).   5. Hypokalemia:Changed K to 25meq BID. Repeat BMET. K 3.4 yesterday.  6. CAD s/p CABG with no angina. Continue ASA/Plavx/statin.  7. Constipation: start miralax.  Ena Dawley, M.D., F.A.C.C.

## 2015-10-10 NOTE — Progress Notes (Addendum)
PT Cancellation Note  Patient Details Name: [name] [name]. MRN: [id number] DOB: 1941/03/04   Cancelled Treatment:    Reason Eval/Treat Not Completed: Patient sleeping soundly. Wife present and reports he has been OOB a few times this morning (to bathroom ?BSC) and up in chair. Requested to allow him to rest and PT offered to attempt to return this pm. Patient needs to attempt up one step.  Spoke with wife re: ability to take pt home by car and safely get into their home. She reports he uses electric scooter from car to step, he stands and steps up one step, and then goes in house to sit. She thinks he will be able to do this and I discussed fall-back option of home via ambulance. She hopes ambulance transport will not be necessary.    Ellise Kovack 10/10/2015, 11:31 AM  Pager [phone]   16:08 pm  Patient sleeping soundly in recliner. Able to arouse him, however he remained lethargic and requesting we not do therapy at this time due to fatigue. He agreed to practice up/down 1 step on 8/08.    10/10/2015 Barry Brunner, PT Pager: [phone]

## 2015-10-10 NOTE — Progress Notes (Signed)
Orthopedic Tech Progress Note Patient Details:  [name] [name] May 11, 1940 [id number]  Ortho Devices Type of Ortho Device: Louretta Parma boot Ortho Device/Splint Location: Bilateral unna boots Ortho Device/Splint Interventions: Application   [place] Pink 10/10/2015, 2:45 PM

## 2015-10-10 NOTE — Care Management Important Message (Signed)
Important Message  Patient Details  Name: [name] [name]. MRN: [id number] Date of Birth: 04-18-1940   Medicare Important Message Given:  Yes    Loann Quill 10/10/2015, 8:34 AM

## 2015-10-11 LAB — GLUCOSE, CAPILLARY
GLUCOSE-CAPILLARY: 140 mg/dL — AB (ref 65–99)
GLUCOSE-CAPILLARY: 102 mg/dL — AB (ref 65–99)
GLUCOSE-CAPILLARY: 234 mg/dL — AB (ref 65–99)
Glucose-Capillary: 203 mg/dL — ABNORMAL HIGH (ref 65–99)

## 2015-10-11 LAB — CREATININE, SERUM
Creatinine, Ser: 2.36 mg/dL — ABNORMAL HIGH (ref 0.61–1.24)
GFR calc Af Amer: 30 mL/min — ABNORMAL LOW (ref >60)
GFR calc non Af Amer: 26 mL/min — ABNORMAL LOW (ref >60)

## 2015-10-11 MED ORDER — FUROSEMIDE 80 MG PO TABS
80.0000 mg | ORAL_TABLET | Freq: Two times a day (BID) | ORAL | Status: DC
  Administered 2015-10-11 – 2015-10-12 (×2): 80 mg via ORAL
  Filled 2015-10-11 (×2): qty 1

## 2015-10-11 MED ORDER — CALCIUM CARBONATE ANTACID 500 MG PO CHEW
1.0000 | CHEWABLE_TABLET | Freq: Three times a day (TID) | ORAL | Status: DC
  Administered 2015-10-11 – 2015-10-12 (×3): 200 mg via ORAL
  Filled 2015-10-11 (×3): qty 1

## 2015-10-11 MED ORDER — ONDANSETRON HCL 4 MG/2ML IJ SOLN: 4.0000 mg | Freq: Four times a day (QID) | INTRAMUSCULAR | Status: DC | PRN

## 2015-10-11 NOTE — Progress Notes (Signed)
Inpatient Diabetes Program Recommendations  AACE/ADA: New Consensus Statement on Inpatient Glycemic Control (2015)  Target Ranges:  Prepandial:   less than 140 mg/dL      Peak postprandial:   less than 180 mg/dL (1-2 hours)      Critically ill patients:  140 - 180 mg/dL   Results for DEMETRY, PARMETER (MRN [id number]) as of 10/11/2015 07:40  Ref. Range 10/10/2015 06:09 10/10/2015 12:23 10/10/2015 16:19 10/10/2015 20:33 10/11/2015 06:24  Glucose-Capillary Latest Ref Range: 65 - 99 mg/dL 92 133 (H) 115 (H) 223 (H) 203 (H)   Review of Glycemic Control  Current orders for Inpatient glycemic control: Novolog 0-15 units TID with meals, Novolog 10 units TID with meals, Tradjenta 5 mg daily, Prandin 2 mg TID with meals  Inpatient Diabetes Program Recommendations: Correction (SSI): Please consider adding Novolog bedtime correction scale.  Thanks, Barnie Alderman, RN, MSN, CDE Diabetes Coordinator Inpatient Diabetes Program [phone] (Team Pager from [place] to [place]) [phone] ([facility] office) [phone] [facility] office) [phone] [facility] office)

## 2015-10-11 NOTE — Progress Notes (Signed)
Patient Profile: 75 year old male with a past medical history of CAD s/p CABG, chronic diastolic CHF, CKD stage III, carotid artery disease, mitral stenosis, AS s/p TAVR in 2015, and DM. He presented to the office on 09/27/15 with SOB and weakness. Admitted with volume overload 2/2 acute on chronic diastolic CHF.   Subjective: Patient's breathing is improved, he was able to walk to the bathroom and back with the help of his wife. However today he feels nauseous and tired and he skipped eating lunch.   Objective: Vital signs in last 24 hours: Temp:  [97.3 F (36.3 C)] 97.3 F (36.3 C) (08/08 0655) Pulse Rate:  [72-73] 72 (08/08 0655) Resp:  [18] 18 (08/08 0655) BP: (97-112)/(47-66) 97/47 (08/08 0655) SpO2:  [100 %] 100 % (08/08 0655) Weight:  [222 lb 8 oz (100.9 kg)] 222 lb 8 oz (100.9 kg) (08/08 0655) Last BM Date: 10/07/15  Intake/Output from previous day: 08/07 0701 - 08/08 0700 In: 1683 [P.O.:1680; I.V.:3] Out: 2550 [Urine:2550] Intake/Output this shift: Total I/O In: 3 [I.V.:3] Out: -   Medications . ALPRAZolam  0.25 mg Oral Daily  . aspirin EC  81 mg Oral Daily  . clopidogrel  75 mg Oral Q breakfast  . enoxaparin (LOVENOX) injection  30 mg Subcutaneous Daily  . famotidine  20 mg Oral Daily  . feeding supplement (GLUCERNA SHAKE)  237 mL Oral BID BM  . furosemide  80 mg Intravenous BID  . insulin aspart  0-15 Units Subcutaneous TID WC  . insulin aspart  10 Units Subcutaneous TID WC  . linagliptin  5 mg Oral Daily  . metolazone  2.5 mg Oral QODAY  . pantoprazole  40 mg Oral q morning - 10a  . polyethylene glycol  17 g Oral Daily  . potassium chloride  120 mEq Oral BID  . repaglinide  2 mg Oral TID AC  . rosuvastatin  20 mg Oral QHS  . senna-docusate  2 tablet Oral QHS  . sertraline  100 mg Oral Daily  . sodium chloride flush  3 mL Intravenous Q12H   PE: General appearance: alert, cooperative and no distress Neck: no carotid bruit and no JVD Lungs: clear to  auscultation bilaterally Heart: regular rate and rhythm, S1, S2 normal, no murmur, click, rub or gallop Extremities: both lower extremities are wrapped, 2+ pitting edema just distal of both knees Pulses: 2+ and symmetric Skin: warm and dry Neurologic: Grossly normal  Lab Results:  No results for input(s): WBC, HGB, HCT, PLT in the last 72 hours. BMET  Recent Labs  10/08/15 1228 10/09/15 1059 10/10/15 0954 10/11/15 0429  NA 134* 135 136  --   K 3.4* 2.4* 3.5  --   CL 92* 90* 94*  --   CO2 34* 34* 32  --   GLUCOSE 220* 154* 143*  --   BUN 65* 58* 58*  --   CREATININE 2.24* 2.21* 2.35* 2.36*  CALCIUM 9.0 9.0 8.9  --    Filed Weights   10/09/15 0400 10/10/15 0429 10/11/15 0655  Weight: 219 lb 9.6 oz (99.6 kg) 220 lb 4.8 oz (99.9 kg) 222 lb 8 oz (100.9 kg)    Assessment/Plan  Active Problems:   Aortic stenosis   CAD (coronary artery disease)   Acute on chronic diastolic heart failure (HCC)   Acute on chronic diastolic CHF (congestive heart failure), NYHA class 4 ([place])   Palliative care encounter   Goals of care, counseling/discussion   Encounter for  hospice care discussion   Medication management   1. Acute on Chronic diastolic heart failure: Good urine output on current diuretic regimen which includes IV Lasix 80 mg tid and metolazone 2.5 mg QOD. - 2.5L out yesterday. I/Os net negative 19.7L since admit. ? Accuracy of documented daily weights, which show upward trend over the last 3 days (219>>220>>222). Reported home baseline weight is 218 lb. Breathing has improved as well as LEE, although still with mild bilateral LEE on exam. Lungs are CTAB.  He is now feeling nauseous his BUN is an 60 range, I will switch to oral Lasix and hold metolazone tomorrow, anticipating discharge tomorrow if he feels better.  Keep legs wrapped to help with edema. I will give Tums and Zofran when necessary for nausea.  2. Acute on chronic renal insuff: BUN/SCr elevated but decreasing since  8/4. Stable over the last 24 hrs, 2.35>>2.36.   3. HTN heart disease:Blood pressure is low normal but stable. Monitor closely in the setting of IV diuretics.   4. DM II:SSI  5. [id number] x-ray 7/31 reporting concern for PNA vs Atelectasis. Started on Levaquin 500mg  daily for 5 days. F/u CBC 8/1 WNL. He is afebrile.   5. Hypokalemia:was as low as 2.4 2 days ago. Changed K to 12meq BID. K improved to 3.5 yesterday. F/u BMP today.   6. CAD s/p CABG with no angina. Continue ASA/Plavx/statin.  7. Constipation: continue Miralax and Senokot.   LOS: 14 days    Brittainy M. Ladoris Gene 10/11/2015 8:13 AM

## 2015-10-11 NOTE — Progress Notes (Signed)
PT Cancellation Note  Patient Details Name: [name] [name]. MRN: [id number] DOB: 07/20/1940   Cancelled Treatment:    Reason Eval/Treat Not Completed: Other (comment)  Patient sleeping on arrival and did not even realize his dinner was beside him. He said his stomach felt better and preferred to eat dinner rather than practice up/down single step with PT. He agreed to practice tomorrow morning (he has refused due to fatigue in the mornings previously). Noted plans for ?discharge on Wed.   [name] [name] 10/11/2015, 5:29 PM  Pager [phone]

## 2015-10-12 ENCOUNTER — Telehealth: Payer: Self-pay | Admitting: Cardiovascular Disease

## 2015-10-12 LAB — GLUCOSE, CAPILLARY
GLUCOSE-CAPILLARY: 132 mg/dL — AB (ref 65–99)
Glucose-Capillary: 124 mg/dL — ABNORMAL HIGH (ref 65–99)

## 2015-10-12 LAB — BASIC METABOLIC PANEL
ANION GAP: 7 (ref 5–15)
BUN: 60 mg/dL — ABNORMAL HIGH (ref 6–20)
CALCIUM: 9.2 mg/dL (ref 8.9–10.3)
CHLORIDE: 100 mmol/L — AB (ref 101–111)
CO2: 27 mmol/L (ref 22–32)
Creatinine, Ser: 2.31 mg/dL — ABNORMAL HIGH (ref 0.61–1.24)
GFR calc Af Amer: 31 mL/min — ABNORMAL LOW (ref >60)
GFR calc non Af Amer: 26 mL/min — ABNORMAL LOW (ref >60)
GLUCOSE: 103 mg/dL — AB (ref 65–99)
Potassium: 5.8 mmol/L — ABNORMAL HIGH (ref 3.5–5.1)
Sodium: 134 mmol/L — ABNORMAL LOW (ref 135–145)

## 2015-10-12 MED ORDER — FUROSEMIDE 80 MG PO TABS: 80.0000 mg | ORAL_TABLET | Freq: Two times a day (BID) | ORAL | 5 refills | Status: DC

## 2015-10-12 MED ORDER — POTASSIUM CHLORIDE CRYS ER 20 MEQ PO TBCR: 40.0000 meq | EXTENDED_RELEASE_TABLET | Freq: Two times a day (BID) | ORAL | 5 refills | Status: AC

## 2015-10-12 MED ORDER — ROSUVASTATIN CALCIUM 20 MG PO TABS: 20.0000 mg | ORAL_TABLET | Freq: Every day | ORAL | 5 refills | Status: AC

## 2015-10-12 NOTE — Progress Notes (Signed)
Nutrition Follow-up   INTERVENTION:  Continue Glucerna Shake po TID PRN, each supplement provides 220 kcal and 10 grams of protein   NUTRITION DIAGNOSIS:   Inadequate oral intake related to poor appetite as evidenced by per patient/family report, meal completion < 50%.  Improving  GOAL:   Patient will meet greater than or equal to 90% of their needs  Being met intermittently  MONITOR:   PO intake, Supplement acceptance, Labs, Weight trends, Skin, I & O's  REASON FOR ASSESSMENT:   Malnutrition Screening Tool    ASSESSMENT:   75 y.o. male with past medical history of CHF, T2DM, and CABG presents to the office for evaluation of progressive edema and shortness of breath.  Pt continues to eat 50-100% of meals and his weight remains stable. Per order history, pt accepts Glucerna Shakes occasionally, but refuses most of the time. Per chart, pt had some nausea yesterday which is improved today with Tums and Zofran. Per nursing notes, he ate 100% of breakfast and lunch today. Per MD note, plan to discharge today.   Labs: high potassium, high BUN, glucose ranging 92 to 234 mg/dL  Diet Order:  Diet Carb Modified Fluid consistency: Thin; Room service appropriate? Yes  Skin:  Reviewed, no issues  Last BM:  8/8  Height:   Ht Readings from Last 1 Encounters:  09/27/15 5' 10"  (1.778 m)    Weight:   Wt Readings from Last 1 Encounters:  10/12/15 222 lb 8 oz (100.9 kg)    Ideal Body Weight:  75.5 kg  BMI:  Body mass index is 31.93 kg/m.  Estimated Nutritional Needs:   Kcal:  1900-2100  Protein:  90-100 grams  Fluid:  per MD  EDUCATION NEEDS:   No education needs identified at this time  [place], LDN Inpatient Clinical Dietitian Pager: [phone] After Hours Pager: [phone]

## 2015-10-12 NOTE — Progress Notes (Addendum)
Patient Profile: 75 year old male with a past medical history of CAD s/p CABG, chronic diastolic CHF, CKD stage III, carotid artery disease, mitral stenosis, AS s/p TAVR in 2015, and DM. He presented to the office on 09/27/15 with SOB and weakness. Admitted with volume overload 2/2 acute on chronic diastolic CHF.   Subjective: Patient's breathing is improved since admit but still a bit short of breath at times with ambulation. No resting dyspnea. Nausea has improved with TUMS and zofran. He is extremely eager to go home.   Objective: Vital signs in last 24 hours: Temp:  [97.9 F (36.6 C)-98.2 F (36.8 C)] 98.2 F (36.8 C) (08/09 0413) Pulse Rate:  [66-69] 67 (08/09 0413) Resp:  [18] 18 (08/09 0413) BP: (100-116)/(43-59) 100/43 (08/09 0413) SpO2:  [97 %-100 %] 100 % (08/09 0413) FiO2 (%):  [28 %] 28 % (08/08 2030) Weight:  [222 lb 8 oz (100.9 kg)] 222 lb 8 oz (100.9 kg) (08/09 0413) Last BM Date: 10/11/15  Intake/Output from previous day: 08/08 0701 - 08/09 0700 In: 1068 [P.O.:1062; I.V.:6] Out: 3150 [Urine:3150] Intake/Output this shift: Total I/O In: 360 [P.O.:360] Out: 375 [Urine:375]  Medications . ALPRAZolam  0.25 mg Oral Daily  . aspirin EC  81 mg Oral Daily  . calcium carbonate  1 tablet Oral TID WC  . clopidogrel  75 mg Oral Q breakfast  . enoxaparin (LOVENOX) injection  30 mg Subcutaneous Daily  . famotidine  20 mg Oral Daily  . feeding supplement (GLUCERNA SHAKE)  237 mL Oral BID BM  . furosemide  80 mg Oral BID  . insulin aspart  0-15 Units Subcutaneous TID WC  . insulin aspart  10 Units Subcutaneous TID WC  . linagliptin  5 mg Oral Daily  . pantoprazole  40 mg Oral q morning - 10a  . polyethylene glycol  17 g Oral Daily  . potassium chloride  120 mEq Oral BID  . repaglinide  2 mg Oral TID AC  . rosuvastatin  20 mg Oral QHS  . senna-docusate  2 tablet Oral QHS  . sertraline  100 mg Oral Daily  . sodium chloride flush  3 mL Intravenous Q12H    PE: General appearance: alert, cooperative and no distress Neck: no carotid bruit and no JVD Lungs: clear to auscultation bilaterally Heart: regular rate and rhythm, S1, S2 normal, no murmur, click, rub or gallop Extremities: both lower extremities are wrapped, 2+ pitting edema just distal of both knees Pulses: 2+ and symmetric Skin: warm and dry Neurologic: Grossly normal  Lab Results:  No results for input(s): WBC, HGB, HCT, PLT in the last 72 hours. BMET  Recent Labs  10/09/15 1059 10/10/15 0954 10/11/15 0429  NA 135 136  --   K 2.4* 3.5  --   CL 90* 94*  --   CO2 34* 32  --   GLUCOSE 154* 143*  --   BUN 58* 58*  --   CREATININE 2.21* 2.35* 2.36*  CALCIUM 9.0 8.9  --    Filed Weights   10/10/15 0429 10/11/15 0655 10/12/15 0413  Weight: 220 lb 4.8 oz (99.9 kg) 222 lb 8 oz (100.9 kg) 222 lb 8 oz (100.9 kg)    Assessment/Plan  Active Problems:   Aortic stenosis   CAD (coronary artery disease)   Acute on chronic diastolic heart failure (HCC)   Acute on chronic diastolic CHF (congestive heart failure), NYHA class 4 ([place])   Palliative care encounter   Goals  of care, counseling/discussion   Encounter for hospice care discussion   Medication management  1. Acute on Chronic diastolic heart failure: Good urine output on - 3.2L out yesterday. I/Os net negative 19.6L since admit. ? Accuracy of documented daily weights, which show upward trend over the last 3 days (219>>220>>222). Reported home baseline weight is 218 lb. Breathing has improved as well as LEE, although still with mild bilateral LEE on exam. Lungs are CTAB.  He was transitioned to oral Lasix today. Metolazone on hold given elevated BUN. BMP ordered and pending. Keep legs wrapped to help with edema. He may benefit from above the knee compression stockings. Low sodium diet.   2. Acute on chronic renal insuff: BUN/SCr elevated but decreasing since 8/4. 2.36 yesterday. F/u BMP today.   3. HTN heart  disease:Blood pressure is low normal but stable. Monitor closely in the setting of IV diuretics.   4. DM II:SSI  5. [id number] x-ray 7/31 reporting concern for PNA vs Atelectasis. Started on Levaquin 500mg  daily for 5 days. F/u CBC 8/1 WNL. He is afebrile.   5. Hypokalemia:was as low as 2.4 2 days ago. Changed K to 62meq BID. K improved to 3.5 yesterday. F/u BMP today.   6. CAD s/p CABG with no angina. Continue ASA/Plavx/statin.  7. Constipation: resolved. Patient had BM today. continue Miralax and Senokot PRN.    LOS: 15 days   Brittainy M. Ladoris Gene 10/12/2015 10:58 AM   The patient was seen, examined and discussed with Brittainy M. Rosita Fire, PA-C and I agree with the above.   Mr [name] [name] feels better today, his nausea has resolved, improved SOB, was able to walk to bathroom with help.  He finally had a bowel movement, but very hard stool, he will resume his home stool softeners. His I&O are stable, Crea slightly increased, I would continue lasix 80 mg po BID, I would continue holding metolazone and decide at the next visit if to restart, his baseline weight is 220 lbs. We will discharge today and plan for a follow up in 7-10 days.   We will order a humidifier for his home O2 therapy. He was also pleased with smaller coated form of KCl supplements instead of big pills, we will contact pharmacy.  He has hyperkalemia today, we will hold KCl today and cut down to 40 mEq BID starting tomorrow. Recheck BMP in 1 week.  Ena Dawley 10/12/2015

## 2015-10-12 NOTE — Discharge Summary (Signed)
Discharge Summary    Patient ID: [name] [name].,  MRN: [id number], DOB/AGE: 10/22/1940 75 y.o.  Admit date: 09/27/2015 Discharge date: 10/12/2015  Primary Care Provider: [name] Primary Cardiologist: Dr. [name]  Discharge Diagnoses    Active Problems:   Aortic stenosis   CAD (coronary artery disease)   Acute on chronic diastolic heart failure (HCC)   Acute on chronic diastolic CHF (congestive heart failure), NYHA class 4 ([place])   Palliative care encounter   Goals of care, counseling/discussion   Encounter for hospice care discussion   Medication management   Allergies Allergies  Allergen Reactions  . Adhesive [Tape] Rash    blistered  . Celecoxib Rash    Diagnostic Studies/Procedures    none   History of Present Illness     75 y/o male, followed by Dr. [name], admitted for acute on chronic diastolic CHF.   The patient has extensive coronary artery disease, valvular heart disease, and chronic congestive heart failure. He initially underwent 6 vessel CABG in 1998. He has undergone multiple PCI procedures in the past. In 2014 he underwent rotational atherectomy and bare-metal stenting of the right coronary artery. He developed progressive valvular disease with severe aortic and mitral stenosis. 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 His mitral stenosis has been managed medically. Other comorbidities include long-standing type 2 diabetes, chronic kidney disease, and chronic diastolic heart failure. The patient is on home oxygen. He has been hospitalized with near syncope and chest pain with conservative management of those problems during past hospitalizations.  Patient presented to the office 09/27/15 with complaint of dyspnea + weight gain and edema.  He was short of breath with minimal activity and was noted to be markedly volume overloaded. Dr. [name] recommended admission for IV diuresis.   Hospital Course      Patient was admitted to telemetry. He was treated with a combination of metolazone and intravenous Lasix. Renal function was monitored closely.  Admission SCr was 2.18. Admit weight was 231 lb (dry weight 220 lb). He had good diuresis with IV lasix + metolazone. He diuresed 19 L total. His breathing and edema improved. He has a rise in SCr and BUN, thus his metolazone was discontinued. He was transitioned to PO lasix and continued to diurese well. Weight day of discharge was 222 lb. SCr rose but stabilized ~2.3. He was discharged home on 80 mg BID and 40 mEq of KDur daily. He was last seen and examined by Dr. Meda Coffee who determined he was stable for discharge home. He will f/u in clinic in 7-10 days with Dr. [name].   Note: day of discharge patient was slightly hypekalemic with K of 5.8. Patient's K-dur was decreased from 120 mEq BID to 40 mEq BID. He will need a f/u BMP in 1 week. Patient was also not discharged home on metolazone. Per Dr. Meda Coffee, can consider restarting at his next office visit if needed.     Discharge Vitals Blood pressure (!) 112/56, pulse 61, temperature 98.6 F (37 C), temperature source Oral, resp. rate 20, height 5\' 10"  (1.778 m), weight 222 lb 8 oz (100.9 kg), SpO2 100 %.  Filed Weights   10/10/15 0429 10/11/15 0655 10/12/15 0413  Weight: 220 lb 4.8 oz (99.9 kg) 222 lb 8 oz (100.9 kg) 222 lb 8 oz (100.9 kg)    Labs & Radiologic Studies    CBC No results for input(s): WBC, NEUTROABS, HGB, HCT,  MCV, PLT in the last 72 hours. Basic Metabolic Panel  Recent Labs  10/10/15 0954 10/11/15 0429 10/12/15 1136  NA 136  --  134*  K 3.5  --  5.8*  CL 94*  --  100*  CO2 32  --  27  GLUCOSE 143*  --  103*  BUN 58*  --  60*  CREATININE 2.35* 2.36* 2.31*  CALCIUM 8.9  --  9.2   Liver Function Tests No results for input(s): AST, ALT, ALKPHOS, BILITOT, PROT, ALBUMIN in the last 72 hours. No results for input(s): LIPASE, AMYLASE in the last 72 hours. Cardiac  Enzymes No results for input(s): CKTOTAL, CKMB, CKMBINDEX, TROPONINI in the last 72 hours. BNP Invalid input(s): POCBNP D-Dimer No results for input(s): DDIMER in the last 72 hours. Hemoglobin A1C No results for input(s): HGBA1C in the last 72 hours. Fasting Lipid Panel No results for input(s): CHOL, HDL, LDLCALC, TRIG, CHOLHDL, LDLDIRECT in the last 72 hours. Thyroid Function Tests No results for input(s): TSH, T4TOTAL, T3FREE, THYROIDAB in the last 72 hours.  Invalid input(s): FREET3 _____________  Dg Chest 2 View  Result Date: 10/03/2015 CLINICAL DATA:  Short of breath and weakness today. History of coronary artery disease, diabetes and hypertension. EXAM: CHEST  2 VIEW COMPARISON:  09/28/2015 FINDINGS: Changes from cardiac surgery and aortic valve replacement are stable. Cardiac silhouette is normal in size. No mediastinal or hilar masses or convincing adenopathy. There is central vascular congestion. Mild bilateral interstitial thickening that is greatest the lower lungs. Additional opacity is noted in both posterior lower lobes. This latter finding is likely atelectasis. Pneumonia is possible. Next item no significant pleural effusion. No pneumothorax. Bony thorax is demineralized but grossly intact. IMPRESSION: 1. Increased basilar opacity when compared the prior study likely atelectasis. 2. Vascular congestion interstitial thickening is similar to the most recent prior exam allowing for lower lung volumes on the current study. Mild congestive heart failure is suspected. Electronically Signed   By: Lajean Manes M.D.   On: 10/03/2015 14:49   Dg Chest Port 1 View  Result Date: 09/28/2015 CLINICAL DATA:  Shortness of breath with CHF. EXAM: PORTABLE CHEST 1 VIEW COMPARISON:  02/10/2015. FINDINGS: 0541 hours. The cardio pericardial silhouette is enlarged. Vascular congestion noted with interstitial pulmonary edema. No focal airspace consolidation. No substantial pleural effusion. Left basilar  atelectasis noted. Bones are diffusely demineralized. Telemetry leads overlie the chest. IMPRESSION: Cardiomegaly with vascular congestion and interstitial pulmonary edema. Electronically Signed   By: Misty Stanley M.D.   On: 09/28/2015 07:08  Disposition   Pt is being discharged home today in good condition.  Follow-up Plans & Appointments    Follow-up Information    [facility] .   Why:  They will do your home health care at your home Contact information: [address] [place] [address] [phone]        [facility] .   Specialty:  Hospice Services Why:  They will do your home hospice care at your home Contact information: [place] [address] [phone]        [name], MD Follow up on 10/24/2015.   Specialty:  Cardiology Why:  3:30 PM  Contact information: [id number] [place] [address] [phone]          Discharge Instructions    (HEART FAILURE PATIENTS) Call MD:  Anytime you have any of the following symptoms: 1) 3 pound weight gain  in 24 hours or 5 pounds in 1 week 2) shortness of breath, with or without a dry hacking cough 3) swelling in the hands, feet or stomach 4) if you have to sleep on extra pillows at night in order to breathe.    Complete by:  As directed   Diet - low sodium heart healthy    Complete by:  As directed   Diet - low sodium heart healthy    Complete by:  As directed   Discharge instructions    Complete by:  As directed   DO NOT take potassium pill night of 10/12/15. You may resume potassium pills on 10/13/2015.  STOP taking metolazone.  Get Repeat Lab Work (BMP) in 1 week on 10/19/15 to check kidney function and potassium level. Take prescription with you to lab.   Eat a Low Sodium Diet.   Check Weight Daily. Call Dr. [name] if you gain more than 3 lb in a 24 hr period.   Increase activity slowly    Complete by:  As directed   Increase  activity slowly    Complete by:  As directed      Discharge Medications   Current Discharge Medication List    CONTINUE these medications which have CHANGED   Details  furosemide (LASIX) 80 MG tablet Take 1 tablet (80 mg total) by mouth 2 (two) times daily. with food Qty: 60 tablet, Refills: 5    potassium chloride SA (K-DUR,KLOR-CON) 20 MEQ tablet Take 2 tablets (40 mEq total) by mouth 2 (two) times daily. Qty: 120 tablet, Refills: 5    rosuvastatin (CRESTOR) 20 MG tablet Take 1 tablet (20 mg total) by mouth at bedtime. Qty: 30 tablet, Refills: 5      CONTINUE these medications which have NOT CHANGED   Details  ALPRAZolam (XANAX) 0.25 MG tablet Take 1 tablet (0.25 mg total) by mouth 2 (two) times daily as needed for anxiety. Qty: 10 tablet, Refills: 0   Associated Diagnoses: Anxiety    aspirin EC 81 MG tablet Take 81 mg by mouth daily.    clopidogrel (PLAVIX) 75 MG tablet Take 75 mg by mouth daily with breakfast.    colchicine 0.6 MG tablet Take 1 tablet (0.6 mg total) by mouth daily as needed (for gout). Qty: 20 tablet, Refills: 0    ezetimibe (ZETIA) 10 MG tablet Take 10 mg by mouth daily.    fish oil-omega-3 fatty acids 1000 MG capsule Take 1 g by mouth 2 (two) times daily.     HUMALOG 100 UNIT/ML injection Inject 10 Units into the skin 3 (three) times daily. Baseline stays at 10 units. Adjust dose at needed based on sliding scale and bg readings    JANUVIA 100 MG tablet Take 100 mg by mouth at bedtime.     methocarbamol (ROBAXIN) 500 MG tablet Take 1 tablet (500 mg total) by mouth daily as needed (leg cramps). Qty: 30 tablet, Refills: 1   Associated Diagnoses: Muscle cramps    NITROSTAT 0.4 MG SL tablet DISSOLVE 1 TABLET UNDER THE TONGUE FOR CHEST PAIN. MAY REPEAT EVERY 5MINUTES UP TO 3 DOSES. IF NO RELIEF, CALL 911** Qty: 25 tablet, Refills: 1    pantoprazole (PROTONIX) 40 MG tablet Take 1 tablet (40 mg total) by mouth every morning. Qty: 90 tablet, Refills: 3      polyethylene glycol powder (GLYCOLAX/MIRALAX) powder Take 17 g by mouth daily as needed (for constipation). Qty: 850 g, Refills: 1    Powders (ANTI MONKEY BUTT)  POWD Apply 1 application topically as needed (burning itching drying up). Apply to legs and rub in    ranitidine (ZANTAC) 150 MG tablet Take 150 mg by mouth 2 (two) times daily.     repaglinide (PRANDIN) 2 MG tablet Take 2 mg by mouth 3 (three) times daily before meals.     sertraline (ZOLOFT) 100 MG tablet Take 1 tablet (100 mg total) by mouth daily. Qty: 90 tablet, Refills: 1    TOUJEO SOLOSTAR 300 UNIT/ML SOPN Inject 40 Units into the skin 3 (three) times daily with meals.     Vitamin D, Ergocalciferol, (DRISDOL) 50000 UNITS CAPS Take 50,000 Units by mouth every 30 (thirty) days. Take every month on saturday      STOP taking these medications     metolazone (ZAROXOLYN) 2.5 MG tablet      traMADol (ULTRAM) 50 MG tablet            Outstanding Labs/Studies   BMP in 1 week   Duration of Discharge Encounter   Greater than 30 minutes including physician time.  Signed, Lyda Jester PA-C 10/12/2015, 3:14 PM

## 2015-10-12 NOTE — Progress Notes (Signed)
Physical Therapy Treatment Patient Details Name: [name] [name]. MRN: [id number] DOB: 31-Mar-1940 Today's Date: 10/12/2015    History of Present Illness Pt is a 75 y/o F who presented with progressive edema and SOB.  Admitted for acute on chronic diastolic heart failure.  Hypoglycemic episode 7/25 with pt feeling weak and diaphoretic, has been feeling better since.  Pt's PMH includes stasis ulcers Bil LEs, CABG x6, fx of Rt humerus, hearing loss, low back pain, on home O2, TAVR.    PT Comments    Pt practiced stepping up onto 1 step, 2x, but would not ambulate after that reporting too fatigued. Encouraged pt to ambulate later. O2 sat monitor would not read after stepping, HR 73-77 bpm.  PT will continue to follow.   Follow Up Recommendations  Home health PT;Supervision/Assistance - 24 hour     Equipment Recommendations  Rolling walker with 5" wheels    Recommendations for Other Services OT consult     Precautions / Restrictions Precautions Precautions: Fall Restrictions Weight Bearing Restrictions: No Other Position/Activity Restrictions: HOH    Mobility  Bed Mobility               General bed mobility comments: pt received OOB  Transfers Overall transfer level: Needs assistance Equipment used: Rolling walker (2 wheeled) Transfers: Sit to/from Stand Sit to Stand: Supervision         General transfer comment: practiced standing from recliner, increased time needed but no physical assist  Ambulation/Gait Ambulation/Gait assistance: Min guard Ambulation Distance (Feet): 3 Feet Assistive device: Rolling walker (2 wheeled) Gait Pattern/deviations: Step-through pattern;Trunk flexed Gait velocity: decreased Gait velocity interpretation: <1.8 ft/sec, indicative of risk for recurrent falls General Gait Details: pt ambulated to step to practice. Attempted to get him to ambulate more after he had rested from step but he reported that he was too fatigued, Wife  encouraged as well but he still refused   Stairs Stairs: Yes Stairs assistance: Min guard Stair Management: Forwards;With walker;No rails Number of Stairs: 1 General stair comments: stepped onto 1 step 2x and back down to simulate getting in home as well as stool to  get to his bed. Pt was able to complete but had to sit down and rest after second time and was SOB as well as c/o LE weakness.   Wheelchair Mobility    Modified Rankin (Stroke Patients Only)       Balance Overall balance assessment: Needs assistance Sitting-balance support: No upper extremity supported Sitting balance-Leahy Scale: Good     Standing balance support: No upper extremity supported;During functional activity Standing balance-Leahy Scale: Fair Standing balance comment: pt able to maintain static standing without support but reaxches for stable surfaces as soon as he begins to step                    Cognition Arousal/Alertness: Awake/alert Behavior During Therapy: WFL for tasks assessed/performed Overall Cognitive Status: Within Functional Limits for tasks assessed                      Exercises General Exercises - Lower Extremity Ankle Circles/Pumps: AROM;Both;10 reps;Seated Heel Slides: AROM;Both;10 reps;Seated    General Comments General comments (skin integrity, edema, etc.): O2 sat monitor would not read on pt. Kept on O2 throughout session, DOE 2/4. HR 73-77 bpm      Pertinent Vitals/Pain Pain Assessment: No/denies pain    Home Living  Prior Function            PT Goals (current goals can now be found in the care plan section) Acute Rehab PT Goals Patient Stated Goal: decreased pain, be able to walk farther PT Goal Formulation: With patient/family Time For Goal Achievement: 10/26/15 Potential to Achieve Goals: Good Progress towards PT goals: Progressing toward goals    Frequency  Min 3X/week    PT Plan Current plan remains  appropriate    Co-evaluation             End of Session Equipment Utilized During Treatment: Oxygen Activity Tolerance: Patient limited by fatigue Patient left: in chair;with call bell/phone within reach;with family/visitor present     Time: [id number] PT Time Calculation (min) (ACUTE ONLY): 17 min  Charges:  $Gait Training: 8-22 mins                    G Codes:     Leighton Roach, PT  Acute Rehab Services  [place], [place] 10/12/2015, 10:04 AM

## 2015-10-12 NOTE — Telephone Encounter (Signed)
New message    TOC appt 8.21.2017 with Dr. [name] per Ellen Henri.

## 2015-10-13 NOTE — Telephone Encounter (Signed)
Lm to call back; TCM pt

## 2015-10-14 ENCOUNTER — Telehealth: Payer: Self-pay

## 2015-10-14 MED ORDER — MORPHINE SULFATE (CONCENTRATE) 20 MG/ML PO SOLN: ORAL | 0 refills | Status: DC

## 2015-10-14 MED ORDER — MORPHINE SULFATE ER 15 MG PO TBCR: 15.0000 mg | EXTENDED_RELEASE_TABLET | Freq: Two times a day (BID) | ORAL | 0 refills | Status: AC | PRN

## 2015-10-14 NOTE — Telephone Encounter (Signed)
[name] [name] is seeing pt and pt is SOB upon exertion. Pt has Tramadol at home but that can cause overload of fluid; requesting 15 day supply of MS contin ER tabs 15 mg taking one bid for cramps and SOB. Also request 15 day supply of roxanol 20 mg/ml instructions 0.5 - 1.0 ml q2h prn. Request to fax both prescriptions to [facility] with Hospice on the rx. [name] request cb so she can let pts wife know. Dr Damita Dunnings out of office and sending note to Dr Darnell Level.

## 2015-10-14 NOTE — Telephone Encounter (Signed)
[name] notified and Rx's faxed to [facility]. [name] just wanted to clarify that you DO want him to try the tramadol. She said it can cause fluid retention.

## 2015-10-14 NOTE — Telephone Encounter (Signed)
LM TO CALL BACK ./CY 

## 2015-10-14 NOTE — Telephone Encounter (Signed)
Hospice patient.  Printed and in Kim's box.  Would trial tramadol first to see if symptomatic improvement.  To PCP as fyi.

## 2015-10-14 NOTE — Telephone Encounter (Signed)
I not aware of this side effect.

## 2015-10-16 NOTE — Telephone Encounter (Addendum)
I have never seen or heard of that side effect from this med.  If he clearly can't tolerate it, then it makes sense to change to roxanol but I agree with Dr. Darnell Level about trying tramadol first.  Please get update on patient. Thanks.

## 2015-10-17 NOTE — Telephone Encounter (Signed)
Spoke with wife Ardath Sax) who says they were told at the hospital that he needs to be off Tramadol because of fluid retention.  Hospice nurse also states this.  Wife says they have Morphine but it isn't really helping.

## 2015-10-17 NOTE — Telephone Encounter (Signed)
I added the tramadol to his intolerance list.  Per report, the hospice nurse is going out this afternoon.   It would be okay to try taking roxanol q1h for 3 doses to see if that helps with SOB.   Thanks.

## 2015-10-21 ENCOUNTER — Telehealth: Payer: Self-pay | Admitting: Cardiovascular Disease

## 2015-10-21 ENCOUNTER — Telehealth: Payer: Self-pay | Admitting: Family Medicine

## 2015-10-21 ENCOUNTER — Encounter: Payer: Self-pay | Admitting: Cardiovascular Disease

## 2015-10-21 MED ORDER — NYSTATIN 100000 UNIT/ML MT SUSP: 5.0000 mL | Freq: Four times a day (QID) | OROMUCOSAL | 1 refills | Status: AC

## 2015-10-21 NOTE — Telephone Encounter (Signed)
Thanks for leaving message.  Please try to notify family about picking up nystatin in the meantime.  Thanks.

## 2015-10-21 NOTE — Telephone Encounter (Signed)
Nystatin sent for thrush.  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  I didn't send tramadol.  Prev added to allergy/intolerance list.  Would use morphine prn.  Thanks.

## 2015-10-21 NOTE — Telephone Encounter (Signed)
Wife advised. 

## 2015-10-21 NOTE — Telephone Encounter (Signed)
New message      Pt c/o swelling: STAT is pt has developed SOB within 24 hours  1. How long have you been experiencing swelling? Since hosp stay 2. Where is the swelling located? Legs,feet,ankles 3.  Are you currently taking a "fluid pill"?yes 4.  Are you currently SOB? Yes when pt stands 5.  Have you traveled recently?no Pt has also gained 4lbs since 10-16-15.  Pt has an appt on Monday the 21st.  Should he take an extra lasix?

## 2015-10-21 NOTE — Telephone Encounter (Signed)
Crystal called wanting to get a verbal order to labs Bmp  Also pt needs refill on tramdoal  She stated he looks like he has thrush in is mouth can you call something in for that also [facility]

## 2015-10-21 NOTE — Telephone Encounter (Signed)
I was not able to get to this message until now.  Hospice office closes at 5 pm.  I was put through to the triage desk and left message with Manti.  I asked if there was a direct number that I could call [place] said "no".

## 2015-10-21 NOTE — Telephone Encounter (Signed)
Discussed with Dr [name] and he recommended that the pt take a dosage of metolazone 2.5mg  30 minutes prior to afternoon dosage of Furosemide today.  I spoke with the pt's wife and made her aware of these instructions. The pt will follow-up with Dr [name] as scheduled on 10/24/15. The pt had hyperkalemia on discharge so he was not advised to take extra potassium.  If the pt develops other symptoms then they will contact the on call staff over the weekend.

## 2015-10-22 ENCOUNTER — Encounter: Payer: Self-pay | Admitting: Cardiovascular Disease

## 2015-10-24 ENCOUNTER — Encounter: Payer: Self-pay | Admitting: Cardiovascular Disease

## 2015-10-24 ENCOUNTER — Ambulatory Visit (INDEPENDENT_AMBULATORY_CARE_PROVIDER_SITE_OTHER): Payer: Medicare Other | Admitting: Cardiovascular Disease

## 2015-10-24 ENCOUNTER — Telehealth: Payer: Self-pay

## 2015-10-24 ENCOUNTER — Ambulatory Visit: Payer: Medicare Other

## 2015-10-24 VITALS — BP 110/50 | HR 74 | Ht 70.0 in | Wt 233.0 lb

## 2015-10-24 DIAGNOSIS — E785 Hyperlipidemia, unspecified: Secondary | ICD-10-CM

## 2015-10-24 DIAGNOSIS — Z954 Presence of other heart-valve replacement: Secondary | ICD-10-CM | POA: Diagnosis not present

## 2015-10-24 DIAGNOSIS — R110 Nausea: Secondary | ICD-10-CM | POA: Diagnosis not present

## 2015-10-24 DIAGNOSIS — Z952 Presence of prosthetic heart valve: Secondary | ICD-10-CM

## 2015-10-24 DIAGNOSIS — I5033 Acute on chronic diastolic (congestive) heart failure: Secondary | ICD-10-CM

## 2015-10-24 MED ORDER — ONDANSETRON HCL 4 MG PO TABS: 4.0000 mg | ORAL_TABLET | Freq: Three times a day (TID) | ORAL | 0 refills | Status: AC | PRN

## 2015-10-24 MED ORDER — TORSEMIDE 20 MG PO TABS: 80.0000 mg | ORAL_TABLET | Freq: Two times a day (BID) | ORAL | 3 refills | Status: AC

## 2015-10-24 MED ORDER — ONDANSETRON HCL 4 MG PO TABS
4.0000 mg | ORAL_TABLET | ORAL | Status: AC
  Administered 2015-10-24: 4 mg via ORAL

## 2015-10-24 MED ORDER — METOLAZONE 2.5 MG PO TABS: ORAL_TABLET | ORAL | 3 refills | Status: AC

## 2015-10-24 NOTE — Telephone Encounter (Signed)
PLEASE NOTE: All timestamps contained within this report are represented as [place] Standard Time. CONFIDENTIALTY NOTICE: This fax transmission is intended only for the addressee. It contains information that is legally privileged, confidential or otherwise protected from use or disclosure. If you are not the intended recipient, you are strictly prohibited from reviewing, disclosing, copying using or disseminating any of this information or taking any action in reliance on or regarding this information. If you have received this fax in error, please notify us immediately by telephone so that we can arrange for its return to [place]. Phone: [phone], Toll-Free: [phone], Fax: [phone] Page: 1 of 1 Call Id: [id number] [place] Night - Client Nonclinical Telephone Record [place] Night - Client Client Site [place] Physician Renford Dills - MD Contact Type Call Call [place] Page Now Who Is [place] / [place] Name [facility] Name [place] Number [phone] Patient Name [name] [name] Patient DOB January 12, 1941 Reason for Call Request to speak to Physician Initial Comment Caller Wanda with Hospice returning a call from the office about a PT. Additional Comment Paging DoctorName Phone DateTime Result/Outcome Message Type Notes Garnet Koyanagi - MD [id number] 10/21/2015 5:30:03 PM Paged On Call Back to Call Center Doctor Paged Please call Quillian Quince with [facility] at [phone] regarding a page, thank you. Garnet Koyanagi - MD 10/21/2015 5:34:47 PM Spoke with On Call - General Message Result Call Closed By: Honor Loh Transaction Date/Time: 10/21/2015 5:19:57 PM (ET)

## 2015-10-24 NOTE — Telephone Encounter (Signed)
Thank you for correcting me.  This call was addressed by on call doc prev.  Thanks.  Will close the note.

## 2015-10-24 NOTE — Telephone Encounter (Signed)
I'm okay with PT for deconditioning if patient desires.  Please give the order if needed.  Thanks.

## 2015-10-24 NOTE — Progress Notes (Signed)
Cardiology Office Note Date:  10/26/2015   ID:  [name] [name]., DOB August 26, 1940, MRN [id number]  PCP:  [name], MD  Cardiologist:  [name], MD    Chief Complaint  Patient presents with  . Shortness of Breath     History of Present Illness: [name] [name]. is a 75 y.o. male who presents for hospital follow-up evaluation.   The patient has extensive coronary artery disease, valvular heart disease, and chronic congestive heart failure. He initially underwent 6 vessel CABG in 1998. He has undergone multiple PCI procedures in the past. In 2014 he underwent rotational atherectomy and bare-metal stenting of the right coronary artery. He developed progressive valvular disease with severe aortic and mitral stenosis. 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 His mitral stenosis has been managed medically. Other comorbidities include long-standing type 2 diabetes, chronic kidney disease, and chronic diastolic heart failure. The patient is on home oxygen. He has been hospitalized with near syncope and chest pain with conservative management of those problems during past hospitalizations.  He was hospitalized again from July 25-Aug 9th for volume overload. He was transitioned to palliative care in the context of multiple severe comorbid conditions. He is now in home hospice. Here with his wife again today for follow-up.   He continues to c/o dyspnea at rest and with any activity. He is increasingly weak. He has worsening edema. Was discharged from the hospital on a lower dose of lasix and off of metolazone because of concern about his renal function. Hospice has been to the house since his hospitalization. He has taken liquid morphine a few times but he doesn't tolerate it well. Son and wife report that he becomes confused and very weak after he takes this.   Past Medical History:  Diagnosis Date  . Anxiety   . Aortic stenosis      a. 04/2012 s/p baloon valvuloplasty w/ reduction in gradient from 47 to 27.  . Arthritis   . CAD (coronary artery disease) 11/05/1996   a. CABG x6 by Dr Redmond Pulling - LIMA to Diag+LAD, SVG to OM1+OM2, SVG to PDA+RPL, b. VG->RCA known to be occluded;  c. 05/2012 PCI/Rota/BMS to m/dRCA w/ 3.5x15 Vision BMS x 2 post-dil to 3.75.  Marland Kitchen Carotid artery disease ([place])   . Cellulitis and abscess of leg, except foot   . Chills   . Chronic diastolic congestive heart failure ([place])    a. 04/2012 Echo: EF 55-60-%  . Cough   . Cramps, muscle, general   . Diabetes mellitus   . Fracture of humerus, proximal, right, closed 2016  . Generalized headaches   . GERD (gastroesophageal reflux disease)   . Hearing loss   . Hiatal hernia   . Hyperlipidemia   . Hypertension   . Leg edema   . Low back pain   . Mitral stenosis    a. moderate by echo 04/2012.  Marland Kitchen Nasal congestion   . On home oxygen therapy    "2L 4PM til 8:30AM qd" (02/10/2015)  . Pulmonary hypertension ([place])    a. 05/2012 RH cath: PA 97/32(54)  . Right knee sprain   . 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 Shortness of breath   . Sleep apnea    no cpap  sleep study >5 yrs  . Venous insufficiency (chronic) (peripheral)   .  Wheezing     Past Surgical History:  Procedure Laterality Date  . Acute or Chronic Diastolic HF  123456 - [id number]   RLE cellulitis - HOSP  . APPENDECTOMY  12/25/2010   Dr Redmond Pulling  . CARDIAC CATHETERIZATION    . CORONARY ARTERY BYPASS GRAFT    . DOPPLER ECHOCARDIOGRAPHY  10/11/2008   Mild AS, Mild-Mod MS, Severe LVH  . ETT Cardiolite  12/31/2006   Mild-Mod distal inf/apical ischemia  . HERNIA REPAIR  [id number]   umbilical hernia repair   . INTRAOPERATIVE TRANSESOPHAGEAL ECHOCARDIOGRAM N/A 06/16/2013   Procedure: INTRAOPERATIVE TRANSESOPHAGEAL ECHOCARDIOGRAM;  Surgeon: [name], MD;  Location: [facility];   Service: Open Heart Surgery;  Laterality: N/A;  . LE Arterial and venous US  10/11/2008   Art clear.  Venous Neg DVT  . LEFT AND RIGHT HEART CATHETERIZATION WITH CORONARY ANGIOGRAM N/A 03/20/2013   Procedure: LEFT AND RIGHT HEART CATHETERIZATION WITH CORONARY ANGIOGRAM;  Surgeon: Blane Ohara, MD;  Location: [facility] CATH LAB;  Service: Cardiovascular;  Laterality: N/A;  . Open Heart Surgery    . PERCUTANEOUS CORONARY ROTOBLATOR INTERVENTION (PCI-R) N/A 05/07/2012   Procedure: PERCUTANEOUS CORONARY ROTOBLATOR INTERVENTION (PCI-R);  Surgeon: [name], MD;  Location: [facility] CATH LAB;  Service: Cardiovascular;  Laterality: N/A;  . TEE WITHOUT CARDIOVERSION  03/20/2012   Procedure: TRANSESOPHAGEAL ECHOCARDIOGRAM (TEE);  Surgeon: [name], MD;  Location: [place];  Service: Cardiovascular;  Laterality: N/A;  . TRANSCATHETER AORTIC VALVE REPLACEMENT, TRANSFEMORAL N/A 06/16/2013   Procedure: TRANSCATHETER AORTIC VALVE REPLACEMENT, TRANSFEMORAL;  Surgeon: [name], MD;  Location: [place];  Service: Open Heart Surgery;  Laterality: N/A;  . UMBILICAL HERNIA REPAIR  12/25/2010   Dr Redmond Pulling    Current Outpatient Prescriptions  Medication Sig Dispense Refill  . ALPRAZolam (XANAX) 0.25 MG tablet Take 1 tablet (0.25 mg total) by mouth 2 (two) times daily as needed for anxiety. 10 tablet 0  . aspirin EC 81 MG tablet Take 81 mg by mouth daily.    . clopidogrel (PLAVIX) 75 MG tablet Take 75 mg by mouth daily with breakfast.    . colchicine 0.6 MG tablet Take 1 tablet (0.6 mg total) by mouth daily as needed (for gout). 20 tablet 0  . ezetimibe (ZETIA) 10 MG tablet Take 10 mg by mouth daily.    . fish oil-omega-3 fatty acids 1000 MG capsule Take 1 g by mouth 2 (two) times daily.     Marland Kitchen HUMALOG 100 UNIT/ML injection Inject 10 Units into the skin 3 (three) times daily. Baseline stays at 10 units. Adjust dose at needed based on sliding scale and bg readings    . JANUVIA 100 MG tablet Take 100 mg by mouth at  bedtime.     . methocarbamol (ROBAXIN) 500 MG tablet Take 1 tablet (500 mg total) by mouth daily as needed (leg cramps). 30 tablet 1  . morphine (MS CONTIN) 15 MG 12 hr tablet Take 1 tablet (15 mg total) by mouth 2 (two) times daily as needed for pain (shortness of breath, cramps). 30 tablet 0  . morphine (ROXANOL) 20 MG/ML concentrated solution Take 10-20 mg by mouth every 2 (two) hours as needed for severe pain. Take 0.5-1.0 mL by mouth every 2 hours as needed for pain    . NITROSTAT 0.4 MG SL tablet DISSOLVE 1 TABLET UNDER THE TONGUE FOR CHEST PAIN. MAY REPEAT EVERY 5MINUTES UP TO 3 DOSES. IF NO RELIEF, CALL 911** 25 tablet 1  . nystatin (MYCOSTATIN) 100000 UNIT/ML  suspension Take 5 mLs (500,000 Units total) by mouth 4 (four) times daily. 60 mL 1  . pantoprazole (PROTONIX) 40 MG tablet Take 1 tablet (40 mg total) by mouth every morning. 90 tablet 3  . polyethylene glycol powder (GLYCOLAX/MIRALAX) powder Take 17 g by mouth daily as needed (for constipation). 850 g 1  . potassium chloride SA (K-DUR,KLOR-CON) 20 MEQ tablet Take 2 tablets (40 mEq total) by mouth 2 (two) times daily. 120 tablet 5  . Powders (ANTI MONKEY BUTT) POWD Apply 1 application topically as needed (burning itching drying up). Apply to legs and rub in    . ranitidine (ZANTAC) 150 MG tablet Take 150 mg by mouth 2 (two) times daily.     . repaglinide (PRANDIN) 2 MG tablet Take 2 mg by mouth 3 (three) times daily before meals.     . rosuvastatin (CRESTOR) 20 MG tablet Take 1 tablet (20 mg total) by mouth at bedtime. 30 tablet 5  . sertraline (ZOLOFT) 100 MG tablet Take 1 tablet (100 mg total) by mouth daily. 90 tablet 1  . TOUJEO SOLOSTAR 300 UNIT/ML SOPN Inject 40 Units into the skin 3 (three) times daily with meals.     . Vitamin D, Ergocalciferol, (DRISDOL) 50000 UNITS CAPS Take 50,000 Units by mouth every 30 (thirty) days. Take every month on saturday    . metolazone (ZAROXOLYN) 2.5 MG tablet Take one tablet by mouth every other  day 30 minutes prior to morning dosage of Torsemide 30 tablet 3  . ondansetron (ZOFRAN) 4 MG tablet Take 1 tablet (4 mg total) by mouth every 8 (eight) hours as needed for nausea or vomiting. 20 tablet 0  . torsemide (DEMADEX) 20 MG tablet Take 4 tablets (80 mg total) by mouth 2 (two) times daily. 240 tablet 3   No current facility-administered medications for this visit.     Allergies:   Tramadol; Adhesive [tape]; and Celecoxib   Social History:  The patient  reports that he has never smoked. He has never used smokeless tobacco. He reports that he does not drink alcohol or use drugs.   Family History:  The patient's  family history includes Diabetes in his father and son; Emphysema in his mother; Heart disease in his father; Other in his son.    ROS:  Please see the history of present illness.  Otherwise, review of systems is positive for confusion, weakness.  All other systems are reviewed and negative.    PHYSICAL EXAM: VS:  BP (!) 110/50   Pulse 74   Ht 5\' 10"  (1.778 m)   Wt 105.7 kg (233 lb)   BMI 33.43 kg/m  , BMI Body mass index is 33.43 kg/m. GEN: ill-appearing male, in no acute distress  HEENT: normal  Neck: JVP elevated, no masses. No carotid bruits Cardiac: RRR with 2/6 systolic murmur at the LSB           Respiratory:  clear to auscultation bilaterally, normal work of breathing GI: soft, nontender, nondistended, + BS MS: no deformity or atrophy  Ext: legs wrapped, edema above knees 2+ bilaterally Neuro:  Strength and sensation are intact Psych: euthymic mood, full affect  EKG:  EKG is ordered today. The ekg ordered today shows sinus rhythm 75 bpm, RBBB, age-indeterminate inferior infarct  Recent Labs: 09/12/2015: Pro B Natriuretic peptide (BNP) 1,387.0 09/27/2015: ALT 12; TSH 2.659 10/03/2015: B Natriuretic Peptide 1,396.3 10/04/2015: Hemoglobin 8.6; Platelets 212 10/22/2015: BUN 71; Creatinine, Ser 2.80; Potassium 4.7; Sodium 133   Lipid  Panel  No results found  for: CHOL, TRIG, HDL, CHOLHDL, VLDL, LDLCALC, LDLDIRECT    Wt Readings from Last 3 Encounters:  10/24/15 105.7 kg (233 lb)  10/12/15 100.9 kg (222 lb 8 oz)  09/27/15 105.1 kg (231 lb 12.8 oz)     Cardiac Studies Reviewed: Echo: Study Conclusions  - Left ventricle: The cavity size was normal. Wall thickness was   increased in a pattern of severe LVH. Systolic function was   normal. The estimated ejection fraction was in the range of 55%   to 60%. Wall motion was normal; there were no regional wall   motion abnormalities. Doppler parameters are consistent with high   ventricular filling pressure. - Ventricular septum: The contour showed diastolic flattening and   systolic flattening. - Aortic valve: A bioprosthesis was present. There was trivial   regurgitation. Valve area (VTI): 2.55 cm^2. Valve area (Vmax):   2.36 cm^2. Valve area (Vmean): 2.52 cm^2. - Mitral valve: Severely calcified annulus. The findings are   consistent with severe stenosis. Valve area by pressure   half-time: 0.98 cm^2. - Left atrium: The atrium was severely dilated. - Right ventricle: The cavity size was mildly dilated. Systolic   function was moderately reduced. - Right atrium: The atrium was moderately dilated. - Pulmonary arteries: PA peak pressure: 37 mm Hg (S).  Impressions:  - Normal LV systolic function; severe LVH; D shaped septum   suggestive of pulmonary hypertension; elevated LV filling   pressure; biatrial enlargement; mild RVE with moderately reduced   RV function; s/p AVR with mean gradient 6 mmHg and trace AI;   severe MAC with severe MS and trace MR.  ASSESSMENT AND PLAN: 1.  NYHA IV Acute on chronic diastolic CHF 2. Severe CAD, prior CABG and PCI 3. Aortic stenosis s/p TAVR with normal function of transcatheter heart valve 4. Severe mitral stenosis 5. CKD IV 6. Type II DM   Tough situation as he continues to decline. We had frank discussion again today and patient and family  both understand there is little to offer at this point. Labs are reviewed and renal function is progressively worse. This is likely related to his heart failure and cardiorenal syndrome. His appetite is minimal and weakness is getting worse. Will change diuretics for comfort as he is markedly volume overloaded. Change lasix to torsemide for better bioavailability. Add back metolazone QOD. Zofran for nausea. Try Xanax instead of morphine for shortness of breath/anxiety. Continue with hospice. Follow-up 4 weeks depending on clinical course.  Current medicines are reviewed with the patient today.  The patient does not have concerns regarding medicines.  Labs/ tests ordered today include:   Orders Placed This Encounter  Procedures  . EKG 12-Lead    Disposition:   FU 4 weeks  Signed, [name], MD  10/26/2015 11:28 PM    [address], [place], [place]  [address] Phone: [phone]; Fax: [phone]

## 2015-10-24 NOTE — Telephone Encounter (Signed)
I think this was about PT meaning patient and not physical therapy.

## 2015-10-24 NOTE — Telephone Encounter (Signed)
Per [place] note spoke with On call provider; do not see PT.

## 2015-10-24 NOTE — Patient Instructions (Addendum)
Medication Instructions:  Your physician has recommended you make the following change in your medication:  1. START Torsemide 20mg  take four tablets by mouth twice a day 2. START Metolazone 2.5mg  take 30 minutes prior to your morning dosage of Torsemide every other day starting tomorrow 3. STOP Furosemide 4. Try Xanax for SOB to see if you tolerate this medication 5. START Zofran 4mg  take one tablet by mouth every 8 hours as needed for nausea and vomiting   Labwork: No new orders.   Testing/Procedures: No new orders.   Follow-Up: Your physician recommends that you schedule a follow-up appointment in: [place] with Dr [name]   Any Other Special Instructions Will Be Listed Below (If Applicable).     If you need a refill on your cardiac medications before your next appointment, please call your pharmacy.

## 2015-10-25 ENCOUNTER — Encounter: Payer: Self-pay | Admitting: Family Medicine

## 2015-10-25 LAB — GLUCOSE (CC13)
BUN: 71
Chloride: 94 mmol/L
Creatinine, Ser: 2.8
GLUCOSE: 130
POTASSIUM: 4.7 mmol/L
Sodium: 133

## 2015-11-03 ENCOUNTER — Other Ambulatory Visit: Payer: Self-pay | Admitting: Cardiovascular Disease

## 2015-11-09 DIAGNOSIS — I5032 Chronic diastolic (congestive) heart failure: Secondary | ICD-10-CM | POA: Diagnosis not present

## 2015-11-10 ENCOUNTER — Telehealth: Payer: Self-pay | Admitting: Family Medicine

## 2015-11-21 ENCOUNTER — Ambulatory Visit: Payer: Medicare Other | Admitting: Cardiovascular Disease

## 2015-12-04 NOTE — Telephone Encounter (Signed)
vergie from hospice called, pt passeda way today at 1152 am

## 2015-12-04 NOTE — Telephone Encounter (Signed)
I called to give my condolences.  I couldn't get through. I'll try again later.   Thanks for the update.  I appreciate the help of all involved in caring for this patient.

## 2015-12-04 NOTE — Telephone Encounter (Signed)
I talked to his wife.  She thanked me for the call.

## 2015-12-04 DEATH — deceased

## 2016-04-11 IMAGING — CR DG CHEST 1V PORT
1 series · 1 of 1 positions shown · non-contrast
Comparison: June 16, 2013

CLINICAL DATA: Aortic valve disease

EXAM:
PORTABLE CHEST - 1 VIEW

[AP]
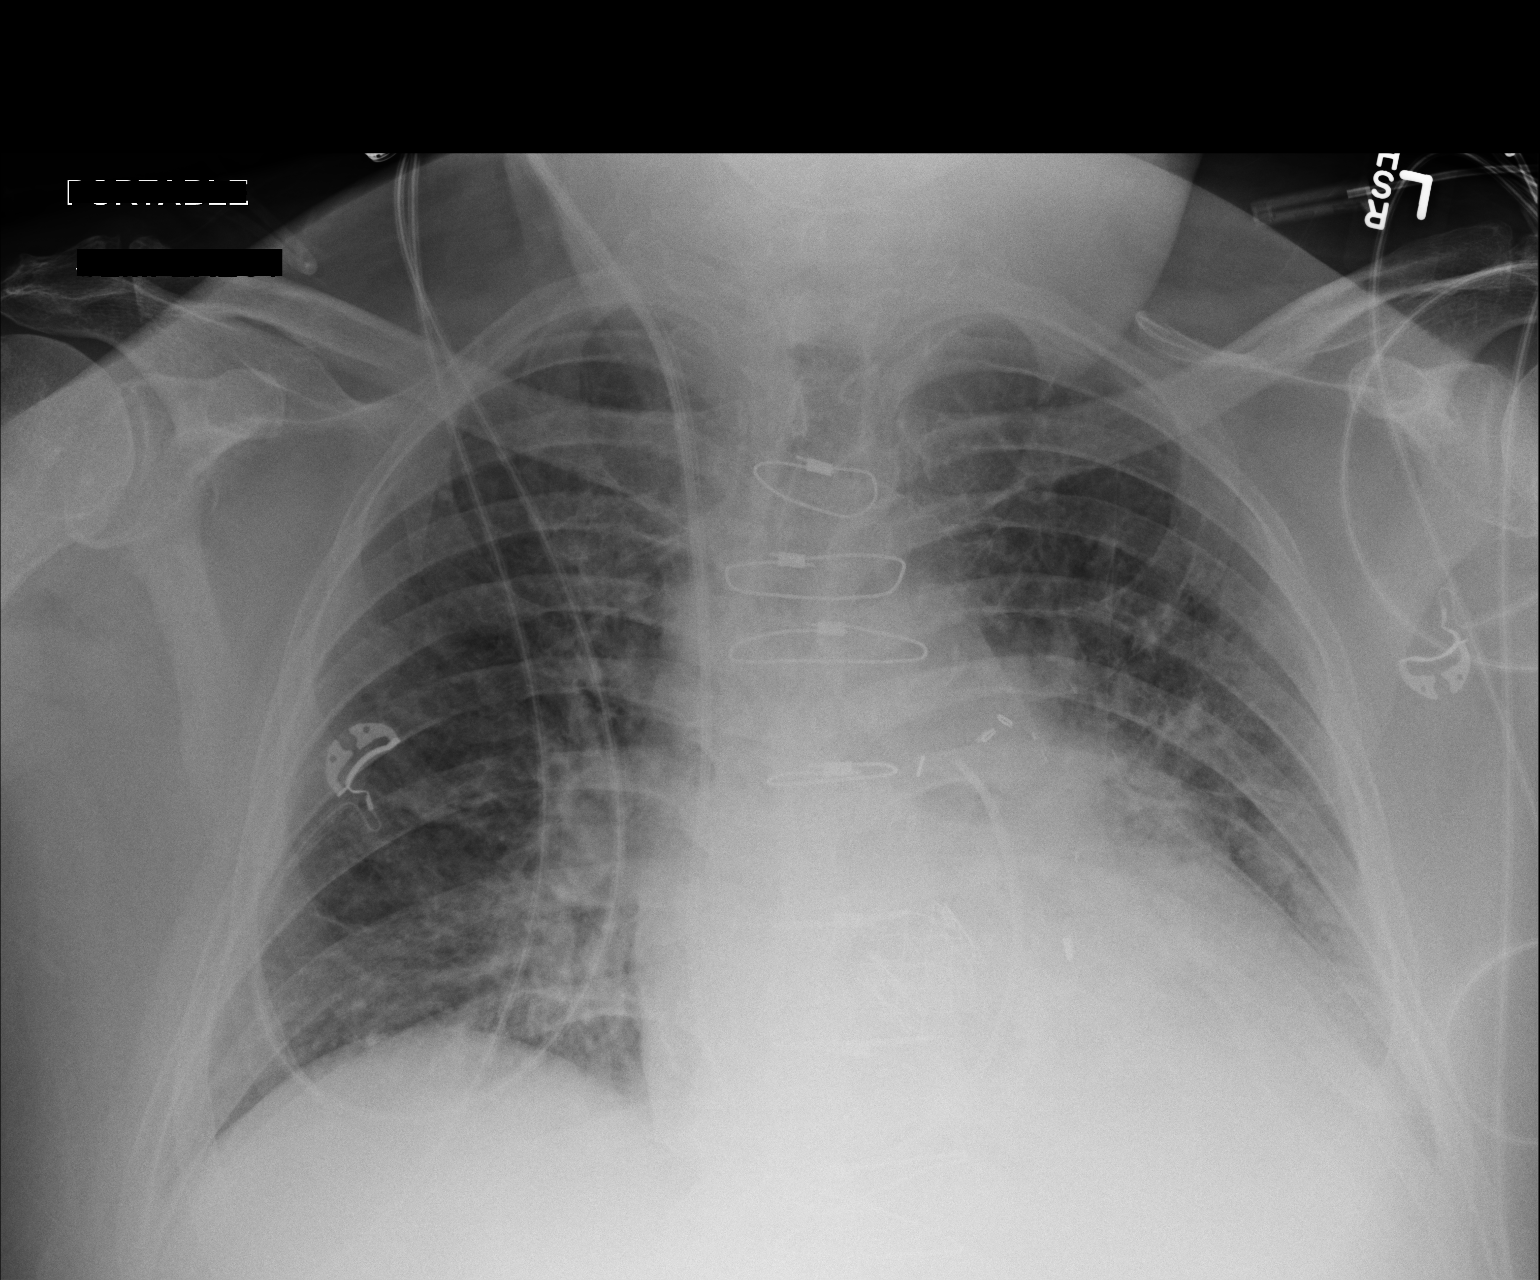

[1 of 1 positions shown; findings below may reference images not displayed]

FINDINGS: There is patchy opacity in the left lower lobe, primarily
representing atelectatic change. Lungs elsewhere clear. Heart is
enlarged with pulmonary vascularity within normal limits. No
adenopathy. Endotracheal tube and nasogastric tube have been
removed. Swan-Ganz catheter tip is in the main pulmonary outflow
tract. Patient is status post aortic valve replacement.
IMPRESSION: Atelectasis with possible patchy infiltrate left lower lobe. Lungs
elsewhere clear. No pneumothorax. Cardiomegaly stable. Central
catheter tip in main pulmonary outflow tract. Endotracheal tube and
nasogastric tube removed.

## 2016-04-30 IMAGING — CR DG CHEST 2V
2 series · 2 of 2 positions shown · non-contrast
Comparison: 06/17/2013

CLINICAL DATA: Follow-up cardiac surgery, aortic valve disease

EXAM:
CHEST  2 VIEW

[w chest pa]
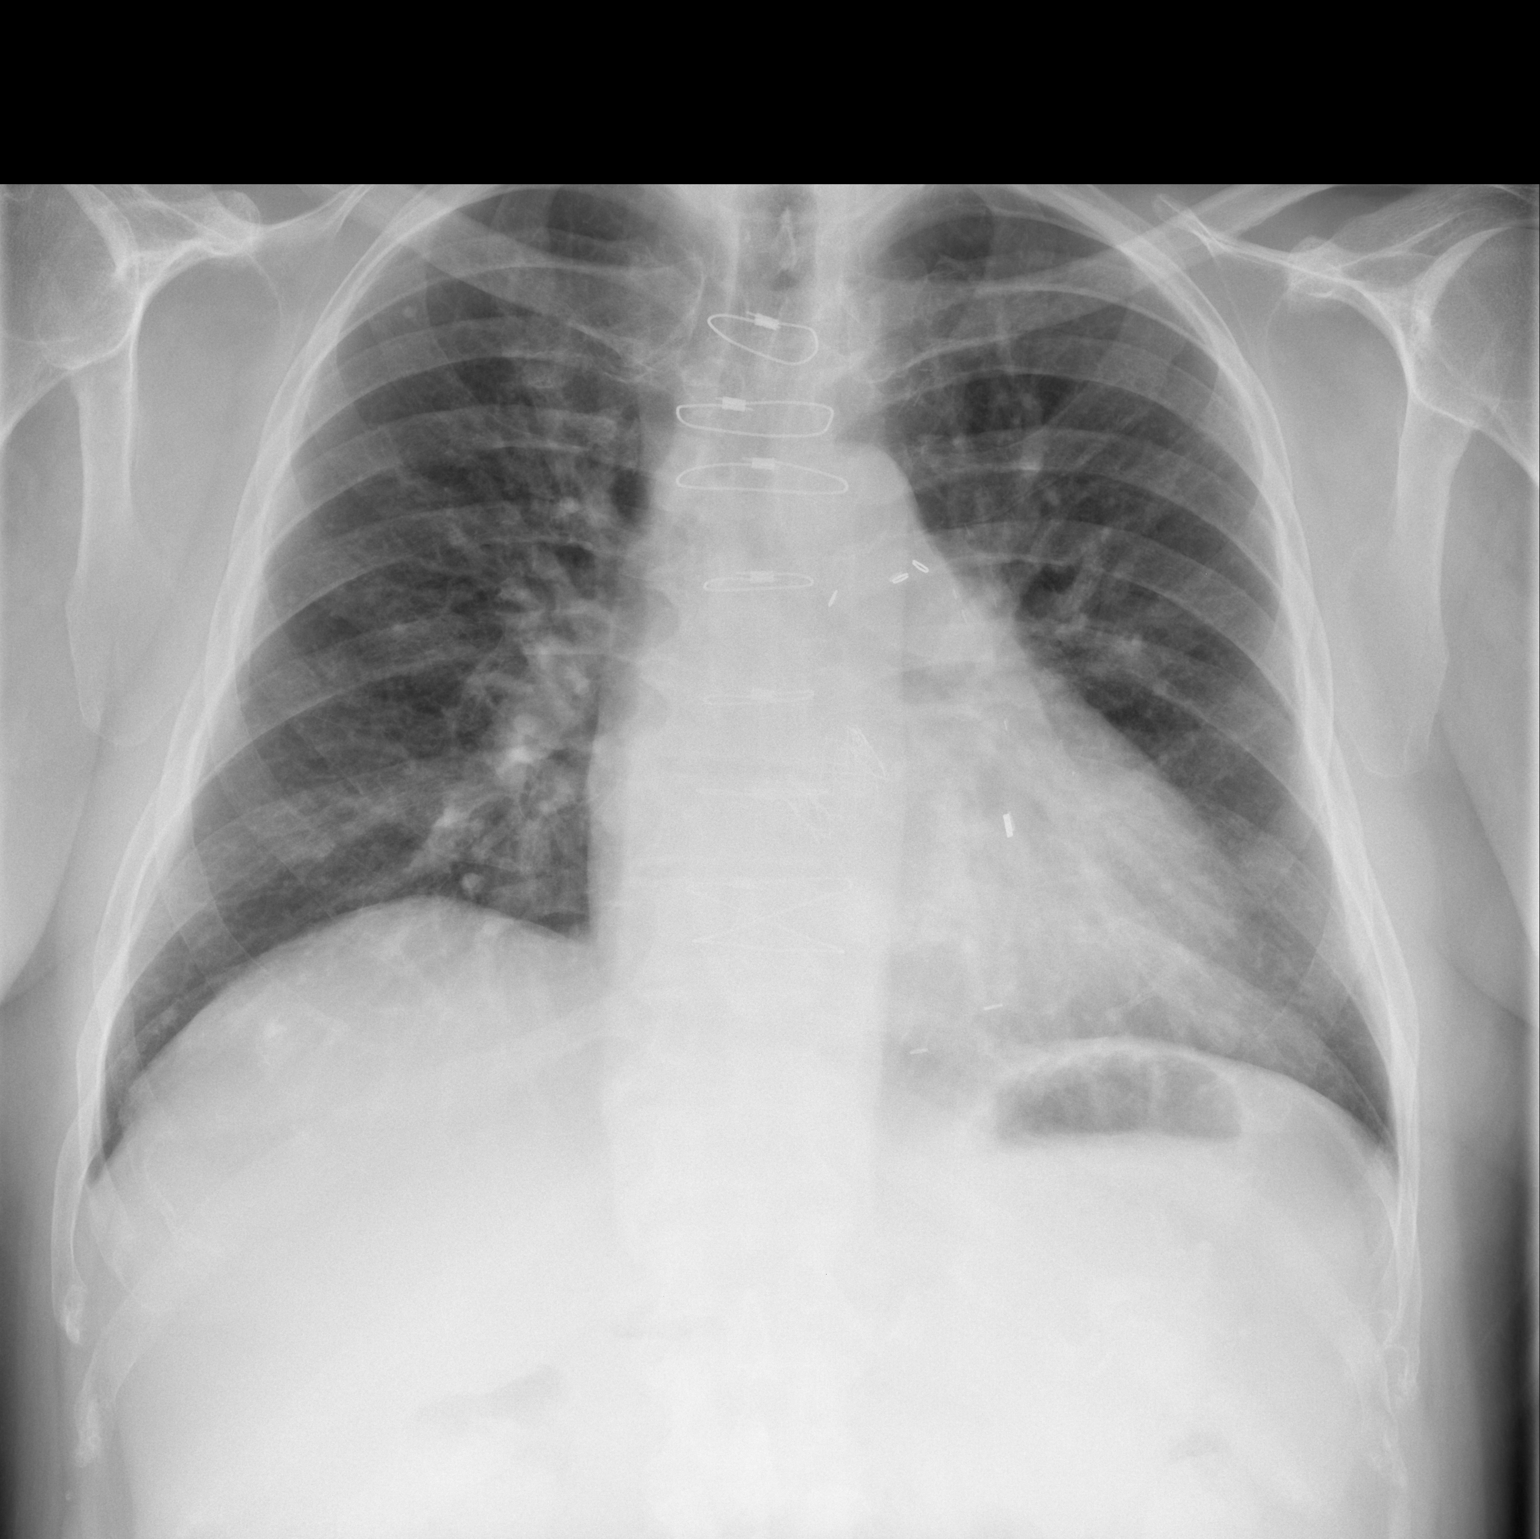

[w chest lat]
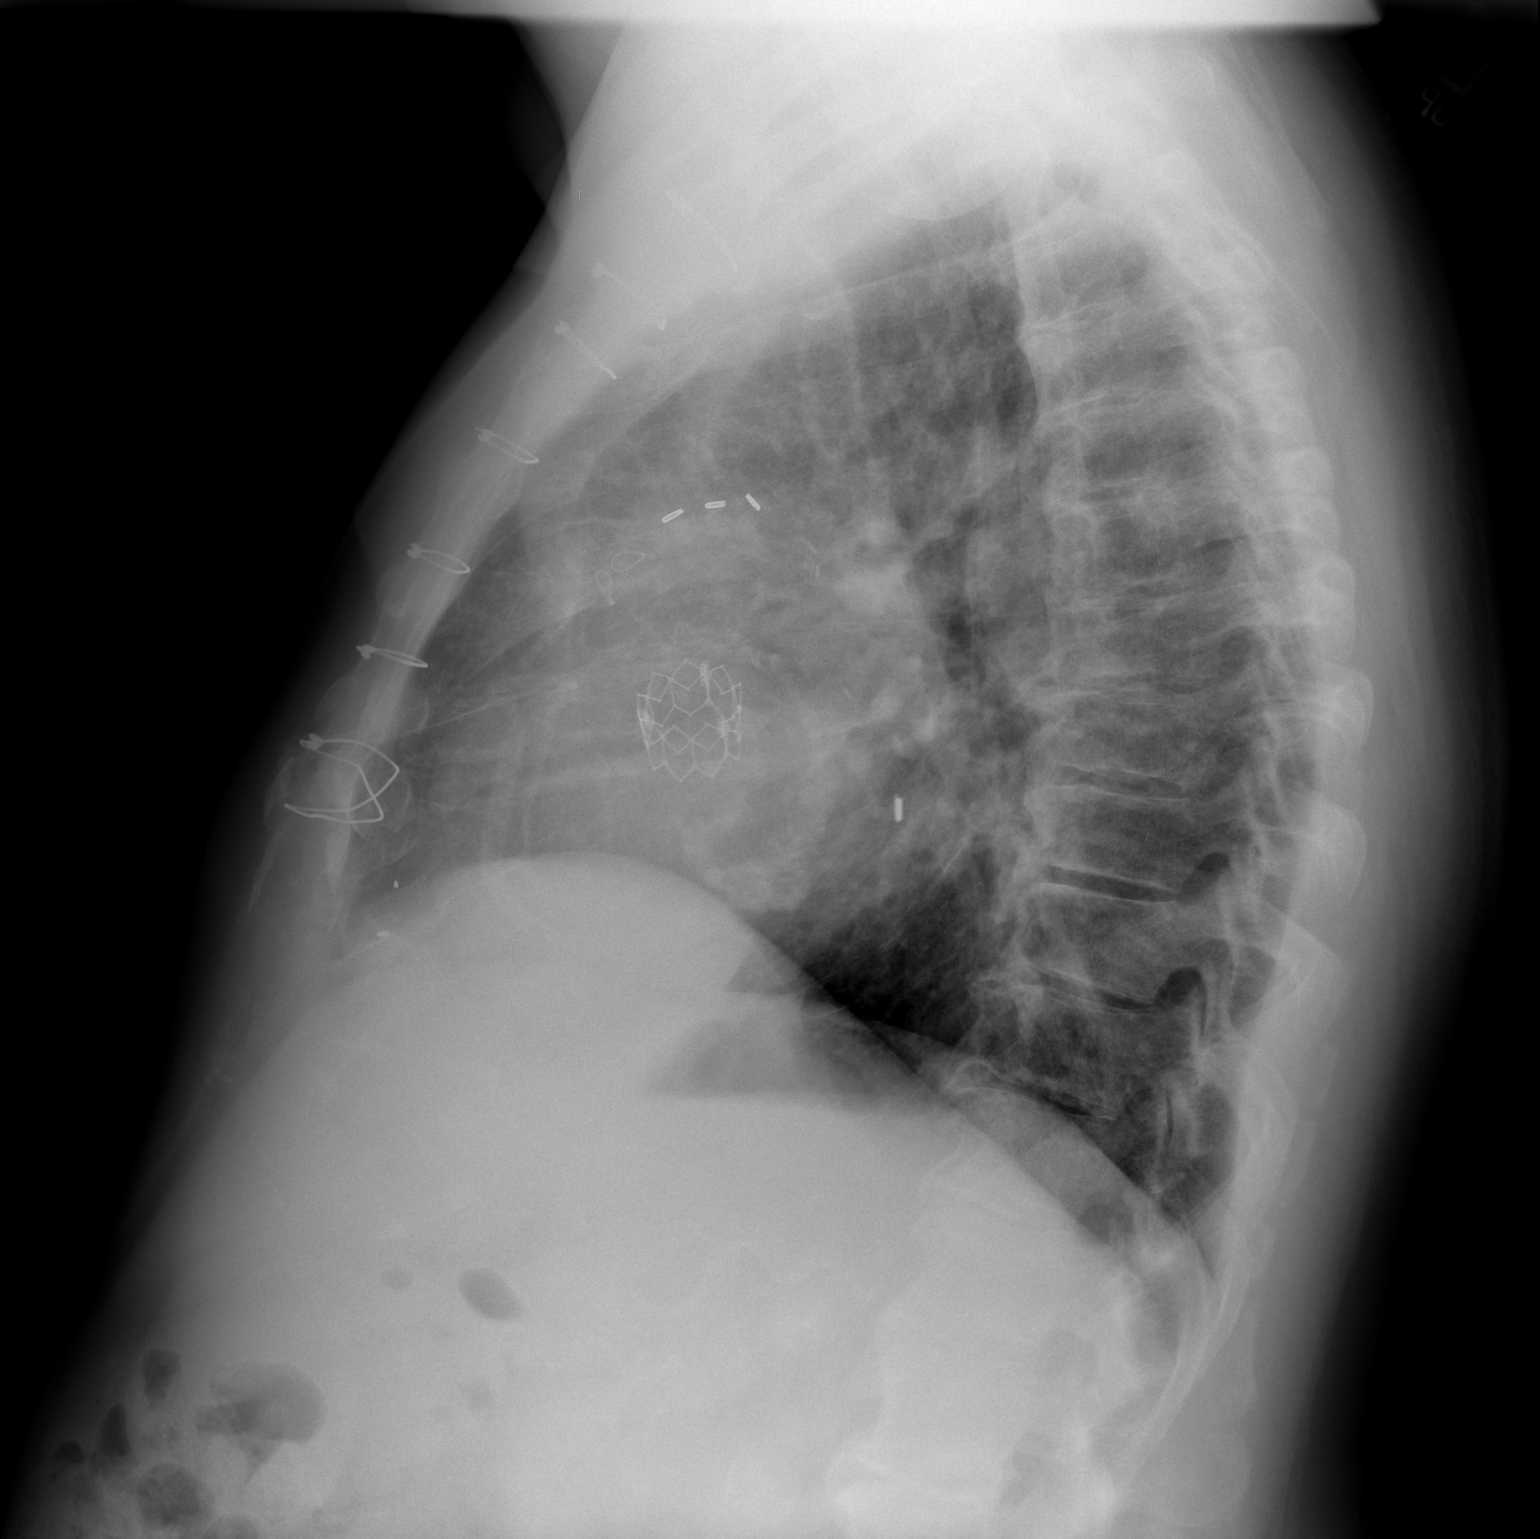

[2 of 2 positions shown; findings below may reference images not displayed]

FINDINGS: Lungs are clear. Suspected calcified granuloma at the lateral right
lung apex. No pleural effusion or pneumothorax.

The heart is top-normal in size. Prosthetic aortic valve.
Postsurgical changes related to prior CABG.

Degenerative changes of the visualized thoracolumbar spine.
IMPRESSION: No evidence of acute cardiopulmonary disease.

## 2017-06-10 ENCOUNTER — Encounter: Payer: Self-pay | Admitting: Family Medicine

## 2017-06-10 NOTE — Progress Notes (Signed)
Letter for patient's wife per her request.  Please make sure it printed. Thanks.

## 2017-06-28 ENCOUNTER — Encounter: Payer: Self-pay | Admitting: Cardiovascular Disease

## 2018-07-23 IMAGING — DX DG CHEST 1V PORT
1 series · 1 of 1 positions shown · non-contrast
Comparison: 02/10/2015.

CLINICAL DATA: Shortness of breath with CHF.

EXAM:
PORTABLE CHEST 1 VIEW

[chest ap]
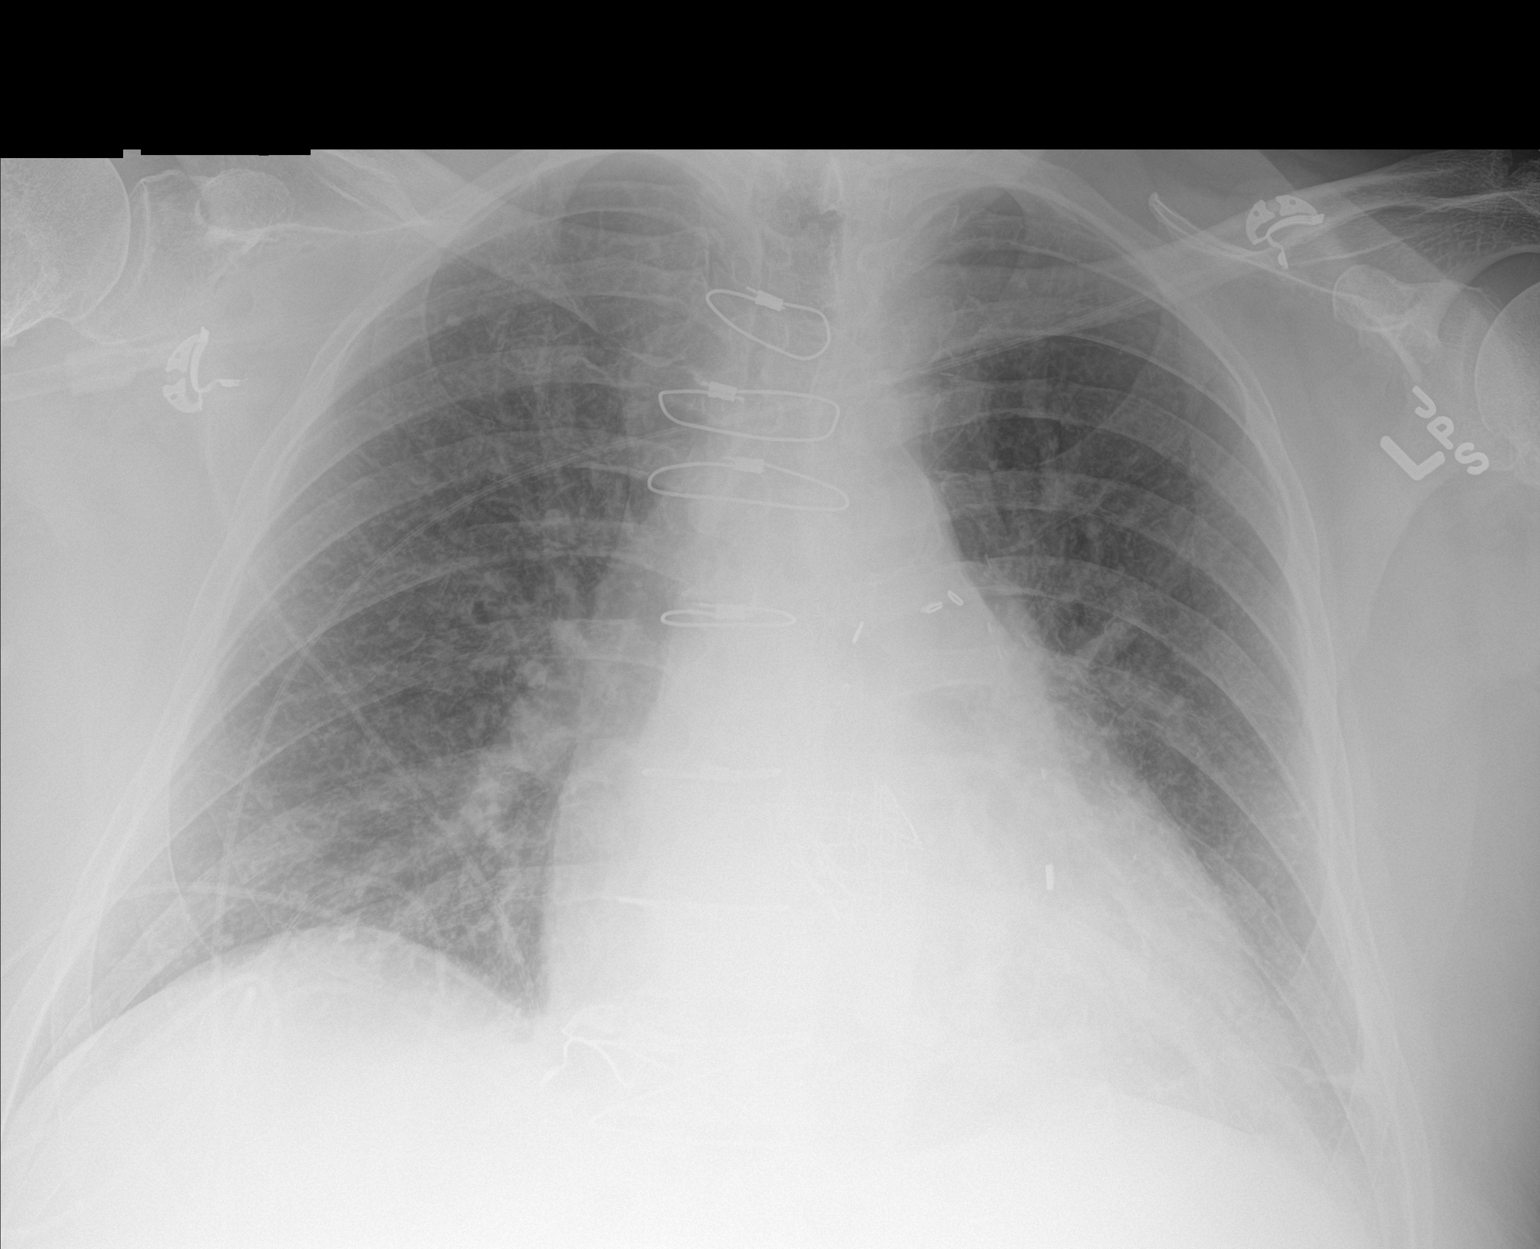

[1 of 1 positions shown; findings below may reference images not displayed]

FINDINGS: 9082 hours. The cardio pericardial silhouette is enlarged. Vascular
congestion noted with interstitial pulmonary edema. No focal
airspace consolidation. No substantial pleural effusion. Left
basilar atelectasis noted. Bones are diffusely demineralized.
Telemetry leads overlie the chest.
IMPRESSION: Cardiomegaly with vascular congestion and interstitial pulmonary
edema.
